# Patient Record
Sex: Male | Born: 1940 | Race: White | Hispanic: No | Smoking: Former smoker
Health system: Southern US, Community
[De-identification: ages and names within clinical notes are randomized; demographics above are authoritative.]

## PROBLEM LIST (undated history)

## (undated) DIAGNOSIS — K219 Gastro-esophageal reflux disease without esophagitis: Secondary | ICD-10-CM

## (undated) DIAGNOSIS — C4492 Squamous cell carcinoma of skin, unspecified: Secondary | ICD-10-CM

## (undated) DIAGNOSIS — I10 Essential (primary) hypertension: Secondary | ICD-10-CM

## (undated) DIAGNOSIS — I2699 Other pulmonary embolism without acute cor pulmonale: Secondary | ICD-10-CM

## (undated) DIAGNOSIS — E785 Hyperlipidemia, unspecified: Secondary | ICD-10-CM

## (undated) DIAGNOSIS — G510 Bell's palsy: Secondary | ICD-10-CM

## (undated) DIAGNOSIS — N2889 Other specified disorders of kidney and ureter: Secondary | ICD-10-CM

## (undated) DIAGNOSIS — I701 Atherosclerosis of renal artery: Secondary | ICD-10-CM

## (undated) DIAGNOSIS — K851 Biliary acute pancreatitis without necrosis or infection: Secondary | ICD-10-CM

## (undated) DIAGNOSIS — H9190 Unspecified hearing loss, unspecified ear: Secondary | ICD-10-CM

## (undated) DIAGNOSIS — H269 Unspecified cataract: Secondary | ICD-10-CM

## (undated) DIAGNOSIS — Z860100 Personal history of colon polyps, unspecified: Secondary | ICD-10-CM

## (undated) DIAGNOSIS — I480 Paroxysmal atrial fibrillation: Secondary | ICD-10-CM

## (undated) DIAGNOSIS — N289 Disorder of kidney and ureter, unspecified: Secondary | ICD-10-CM

## (undated) DIAGNOSIS — M2550 Pain in unspecified joint: Secondary | ICD-10-CM

## (undated) DIAGNOSIS — I2510 Atherosclerotic heart disease of native coronary artery without angina pectoris: Secondary | ICD-10-CM

## (undated) DIAGNOSIS — T7840XA Allergy, unspecified, initial encounter: Secondary | ICD-10-CM

## (undated) DIAGNOSIS — M1990 Unspecified osteoarthritis, unspecified site: Secondary | ICD-10-CM

## (undated) DIAGNOSIS — R739 Hyperglycemia, unspecified: Secondary | ICD-10-CM

## (undated) DIAGNOSIS — N182 Chronic kidney disease, stage 2 (mild): Secondary | ICD-10-CM

## (undated) HISTORY — DX: Atherosclerotic heart disease of native coronary artery without angina pectoris: I25.10

## (undated) HISTORY — DX: Bell's palsy: G51.0

## (undated) HISTORY — DX: Essential (primary) hypertension: I10

## (undated) HISTORY — DX: Gastro-esophageal reflux disease without esophagitis: K21.9

## (undated) HISTORY — DX: Unspecified osteoarthritis, unspecified site: M19.90

## (undated) HISTORY — DX: Hyperlipidemia, unspecified: E78.5

## (undated) HISTORY — DX: Allergy, unspecified, initial encounter: T78.40XA

## (undated) HISTORY — PX: COLONOSCOPY: SHX174

## (undated) HISTORY — DX: Pain in unspecified joint: M25.50

## (undated) HISTORY — DX: Other specified disorders of kidney and ureter: N28.89

## (undated) HISTORY — DX: Squamous cell carcinoma of skin, unspecified: C44.92

## (undated) HISTORY — DX: Unspecified cataract: H26.9

## (undated) HISTORY — DX: Paroxysmal atrial fibrillation: I48.0

## (undated) HISTORY — PX: CARDIAC CATHETERIZATION: SHX172

## (undated) HISTORY — DX: Biliary acute pancreatitis without necrosis or infection: K85.10

## (undated) HISTORY — DX: Chronic kidney disease, stage 2 (mild): N18.2

## (undated) HISTORY — DX: Hyperglycemia, unspecified: R73.9

## (undated) HISTORY — DX: Other pulmonary embolism without acute cor pulmonale: I26.99

## (undated) HISTORY — PX: CATARACT EXTRACTION: SUR2

## (undated) HISTORY — DX: Atherosclerosis of renal artery: I70.1

## (undated) HISTORY — DX: Unspecified hearing loss, unspecified ear: H91.90

---

## 1993-07-15 HISTORY — PX: CORONARY ARTERY BYPASS GRAFT: SHX141

## 2003-02-17 DIAGNOSIS — Z86010 Personal history of colonic polyps: Secondary | ICD-10-CM

## 2004-08-13 ENCOUNTER — Ambulatory Visit: Payer: Self-pay | Admitting: Internal Medicine

## 2004-08-29 ENCOUNTER — Ambulatory Visit: Payer: Self-pay | Admitting: Internal Medicine

## 2004-11-26 ENCOUNTER — Ambulatory Visit: Payer: Self-pay | Admitting: Internal Medicine

## 2005-05-15 ENCOUNTER — Ambulatory Visit: Payer: Self-pay | Admitting: Internal Medicine

## 2005-09-16 ENCOUNTER — Ambulatory Visit: Payer: Self-pay | Admitting: Internal Medicine

## 2005-09-20 ENCOUNTER — Emergency Department (HOSPITAL_COMMUNITY): Admission: EM | Admit: 2005-09-20 | Discharge: 2005-09-20 | Payer: Self-pay | Admitting: Family Medicine

## 2005-09-23 ENCOUNTER — Ambulatory Visit: Payer: Self-pay | Admitting: Internal Medicine

## 2005-10-04 ENCOUNTER — Ambulatory Visit: Payer: Self-pay | Admitting: Internal Medicine

## 2005-10-30 ENCOUNTER — Emergency Department (HOSPITAL_COMMUNITY): Admission: EM | Admit: 2005-10-30 | Discharge: 2005-10-30 | Payer: Self-pay | Admitting: Emergency Medicine

## 2005-10-31 ENCOUNTER — Ambulatory Visit: Payer: Self-pay | Admitting: Internal Medicine

## 2006-02-11 ENCOUNTER — Ambulatory Visit: Payer: Self-pay | Admitting: Internal Medicine

## 2006-05-14 ENCOUNTER — Ambulatory Visit: Payer: Self-pay | Admitting: Internal Medicine

## 2006-06-12 ENCOUNTER — Ambulatory Visit: Payer: Self-pay | Admitting: Internal Medicine

## 2006-06-26 ENCOUNTER — Ambulatory Visit: Payer: Self-pay | Admitting: Internal Medicine

## 2006-06-26 LAB — CONVERTED CEMR LAB
ALT: 20 units/L (ref 0–40)
AST: 23 units/L (ref 0–37)
BUN: 16 mg/dL (ref 6–23)
Calcium: 9.3 mg/dL (ref 8.4–10.5)
Chloride: 110 meq/L (ref 96–112)
Creatinine, Ser: 1.1 mg/dL (ref 0.4–1.5)
GFR calc non Af Amer: 71 mL/min
HDL: 37.5 mg/dL — ABNORMAL LOW (ref >39.0)
LDL Cholesterol: 49 mg/dL (ref 0–99)
PSA: 1.67 ng/mL (ref 0.10–4.00)
Sodium: 142 meq/L (ref 135–145)
TSH: 1.23 microintl units/mL (ref 0.35–5.50)
VLDL: 18 mg/dL (ref 0–40)

## 2006-07-17 ENCOUNTER — Ambulatory Visit: Payer: Self-pay | Admitting: Internal Medicine

## 2006-07-17 LAB — CONVERTED CEMR LAB
Chloride: 104 meq/L (ref 96–112)
GFR calc non Af Amer: 71 mL/min
Glucose, Bld: 125 mg/dL — ABNORMAL HIGH (ref 70–99)
Potassium: 4.5 meq/L (ref 3.5–5.1)
Sodium: 140 meq/L (ref 135–145)

## 2006-08-07 DIAGNOSIS — I10 Essential (primary) hypertension: Secondary | ICD-10-CM | POA: Insufficient documentation

## 2006-08-07 DIAGNOSIS — K219 Gastro-esophageal reflux disease without esophagitis: Secondary | ICD-10-CM | POA: Insufficient documentation

## 2006-08-07 DIAGNOSIS — J309 Allergic rhinitis, unspecified: Secondary | ICD-10-CM | POA: Insufficient documentation

## 2006-08-07 DIAGNOSIS — I2510 Atherosclerotic heart disease of native coronary artery without angina pectoris: Secondary | ICD-10-CM | POA: Insufficient documentation

## 2006-08-07 DIAGNOSIS — E785 Hyperlipidemia, unspecified: Secondary | ICD-10-CM | POA: Insufficient documentation

## 2006-08-26 ENCOUNTER — Ambulatory Visit: Payer: Self-pay | Admitting: Internal Medicine

## 2006-12-01 ENCOUNTER — Encounter: Payer: Self-pay | Admitting: Internal Medicine

## 2006-12-01 ENCOUNTER — Ambulatory Visit: Payer: Self-pay | Admitting: Internal Medicine

## 2006-12-01 DIAGNOSIS — R7989 Other specified abnormal findings of blood chemistry: Secondary | ICD-10-CM | POA: Insufficient documentation

## 2006-12-01 DIAGNOSIS — M549 Dorsalgia, unspecified: Secondary | ICD-10-CM | POA: Insufficient documentation

## 2006-12-01 LAB — CONVERTED CEMR LAB
AST: 21 units/L (ref 0–37)
Eosinophils Relative: 4.8 % (ref 0.0–5.0)
HCT: 42.8 % (ref 39.0–52.0)
Hemoglobin: 14.6 g/dL (ref 13.0–17.0)
LDL Cholesterol: 70 mg/dL (ref 0–99)
MCV: 88.5 fL (ref 78.0–100.0)
Monocytes Absolute: 0.8 10*3/uL — ABNORMAL HIGH (ref 0.2–0.7)
Neutrophils Relative %: 58.4 % (ref 43.0–77.0)
RBC: 4.84 M/uL (ref 4.22–5.81)
RDW: 12.7 % (ref 11.5–14.6)
Total CHOL/HDL Ratio: 3.6
WBC: 8.1 10*3/uL (ref 4.5–10.5)

## 2007-03-03 ENCOUNTER — Ambulatory Visit: Payer: Self-pay | Admitting: Internal Medicine

## 2007-06-02 ENCOUNTER — Ambulatory Visit: Payer: Self-pay | Admitting: Internal Medicine

## 2007-06-25 ENCOUNTER — Encounter: Payer: Self-pay | Admitting: Internal Medicine

## 2007-07-01 ENCOUNTER — Ambulatory Visit: Payer: Self-pay | Admitting: Internal Medicine

## 2007-07-01 LAB — CONVERTED CEMR LAB
Blood in Urine, dipstick: NEGATIVE
Glucose, Urine, Semiquant: NEGATIVE
Ketones, urine, test strip: NEGATIVE
Specific Gravity, Urine: 1.02
pH: 6

## 2007-07-02 LAB — CONVERTED CEMR LAB
CO2: 30 meq/L (ref 19–32)
Cholesterol: 130 mg/dL (ref 0–200)
Creatinine, Ser: 1.4 mg/dL (ref 0.4–1.5)
Glucose, Bld: 108 mg/dL — ABNORMAL HIGH (ref 70–99)
HDL: 32.8 mg/dL — ABNORMAL LOW (ref >39.0)
PSA: 2.08 ng/mL (ref 0.10–4.00)
Potassium: 4.9 meq/L (ref 3.5–5.1)
Sodium: 145 meq/L (ref 135–145)
Triglycerides: 127 mg/dL (ref 0–149)

## 2007-07-03 ENCOUNTER — Telehealth (INDEPENDENT_AMBULATORY_CARE_PROVIDER_SITE_OTHER): Payer: Self-pay | Admitting: *Deleted

## 2007-08-26 ENCOUNTER — Ambulatory Visit: Payer: Self-pay | Admitting: Internal Medicine

## 2007-09-02 LAB — CONVERTED CEMR LAB
Calcium: 9.3 mg/dL (ref 8.4–10.5)
Chloride: 109 meq/L (ref 96–112)
GFR calc non Af Amer: 54 mL/min
Glucose, Bld: 123 mg/dL — ABNORMAL HIGH (ref 70–99)

## 2007-09-29 ENCOUNTER — Telehealth (INDEPENDENT_AMBULATORY_CARE_PROVIDER_SITE_OTHER): Payer: Self-pay | Admitting: *Deleted

## 2007-10-27 ENCOUNTER — Telehealth (INDEPENDENT_AMBULATORY_CARE_PROVIDER_SITE_OTHER): Payer: Self-pay | Admitting: *Deleted

## 2007-11-25 ENCOUNTER — Ambulatory Visit: Payer: Self-pay | Admitting: Internal Medicine

## 2007-11-25 DIAGNOSIS — M25519 Pain in unspecified shoulder: Secondary | ICD-10-CM | POA: Insufficient documentation

## 2007-12-02 ENCOUNTER — Telehealth (INDEPENDENT_AMBULATORY_CARE_PROVIDER_SITE_OTHER): Payer: Self-pay | Admitting: *Deleted

## 2007-12-02 LAB — CONVERTED CEMR LAB
CO2: 31 meq/L (ref 19–32)
Chloride: 106 meq/L (ref 96–112)
Creatinine, Ser: 1.5 mg/dL (ref 0.4–1.5)

## 2007-12-09 ENCOUNTER — Telehealth (INDEPENDENT_AMBULATORY_CARE_PROVIDER_SITE_OTHER): Payer: Self-pay | Admitting: *Deleted

## 2007-12-25 ENCOUNTER — Encounter: Payer: Self-pay | Admitting: Internal Medicine

## 2007-12-25 ENCOUNTER — Ambulatory Visit: Payer: Self-pay | Admitting: Internal Medicine

## 2007-12-25 ENCOUNTER — Telehealth (INDEPENDENT_AMBULATORY_CARE_PROVIDER_SITE_OTHER): Payer: Self-pay | Admitting: *Deleted

## 2007-12-25 LAB — CONVERTED CEMR LAB
BUN: 21 mg/dL (ref 6–23)
Calcium: 9.3 mg/dL (ref 8.4–10.5)
Glucose, Bld: 81 mg/dL (ref 70–99)
Potassium: 4.3 meq/L (ref 3.5–5.3)
Sed Rate: 5 mm/hr (ref 0–16)

## 2007-12-26 ENCOUNTER — Telehealth: Payer: Self-pay | Admitting: Internal Medicine

## 2008-01-01 ENCOUNTER — Ambulatory Visit: Payer: Self-pay | Admitting: Internal Medicine

## 2008-01-20 ENCOUNTER — Encounter (INDEPENDENT_AMBULATORY_CARE_PROVIDER_SITE_OTHER): Payer: Self-pay | Admitting: *Deleted

## 2008-01-27 ENCOUNTER — Telehealth: Payer: Self-pay | Admitting: Internal Medicine

## 2008-03-24 ENCOUNTER — Telehealth (INDEPENDENT_AMBULATORY_CARE_PROVIDER_SITE_OTHER): Payer: Self-pay | Admitting: *Deleted

## 2008-03-28 ENCOUNTER — Ambulatory Visit: Payer: Self-pay | Admitting: Internal Medicine

## 2008-03-28 DIAGNOSIS — F528 Other sexual dysfunction not due to a substance or known physiological condition: Secondary | ICD-10-CM | POA: Insufficient documentation

## 2008-03-28 DIAGNOSIS — R5383 Other fatigue: Secondary | ICD-10-CM

## 2008-03-28 DIAGNOSIS — R5381 Other malaise: Secondary | ICD-10-CM | POA: Insufficient documentation

## 2008-04-04 ENCOUNTER — Encounter (INDEPENDENT_AMBULATORY_CARE_PROVIDER_SITE_OTHER): Payer: Self-pay | Admitting: *Deleted

## 2008-04-04 LAB — CONVERTED CEMR LAB
AST: 22 units/L (ref 0–37)
Albumin: 4 g/dL (ref 3.5–5.2)
Basophils Absolute: 0.1 10*3/uL (ref 0.0–0.1)
Basophils Relative: 0.7 % (ref 0.0–3.0)
Cholesterol: 114 mg/dL (ref 0–200)
Eosinophils Absolute: 0.4 10*3/uL (ref 0.0–0.7)
HCT: 45.2 % (ref 39.0–52.0)
Hemoglobin: 15 g/dL (ref 13.0–17.0)
Hgb A1c MFr Bld: 5.6 % (ref 4.6–6.0)
MCHC: 33.1 g/dL (ref 30.0–36.0)
MCV: 88.7 fL (ref 78.0–100.0)
Monocytes Absolute: 0.8 10*3/uL (ref 0.1–1.0)
Neutro Abs: 5.3 10*3/uL (ref 1.4–7.7)
RBC: 5.1 M/uL (ref 4.22–5.81)
RDW: 12.4 % (ref 11.5–14.6)
TSH: 1.61 microintl units/mL (ref 0.35–5.50)
Total Bilirubin: 0.7 mg/dL (ref 0.3–1.2)
Triglycerides: 85 mg/dL (ref 0–149)

## 2008-04-19 ENCOUNTER — Ambulatory Visit: Payer: Self-pay | Admitting: Internal Medicine

## 2008-04-28 ENCOUNTER — Telehealth (INDEPENDENT_AMBULATORY_CARE_PROVIDER_SITE_OTHER): Payer: Self-pay | Admitting: *Deleted

## 2008-07-25 ENCOUNTER — Ambulatory Visit: Payer: Self-pay | Admitting: Internal Medicine

## 2008-07-25 ENCOUNTER — Telehealth (INDEPENDENT_AMBULATORY_CARE_PROVIDER_SITE_OTHER): Payer: Self-pay | Admitting: *Deleted

## 2008-07-25 DIAGNOSIS — L851 Acquired keratosis [keratoderma] palmaris et plantaris: Secondary | ICD-10-CM | POA: Insufficient documentation

## 2008-07-27 ENCOUNTER — Encounter (INDEPENDENT_AMBULATORY_CARE_PROVIDER_SITE_OTHER): Payer: Self-pay | Admitting: *Deleted

## 2008-07-27 LAB — CONVERTED CEMR LAB
BUN: 21 mg/dL (ref 6–23)
Chloride: 103 meq/L (ref 96–112)
GFR calc Af Amer: 96 mL/min
GFR calc non Af Amer: 79 mL/min
Potassium: 4.1 meq/L (ref 3.5–5.1)
Sodium: 141 meq/L (ref 135–145)

## 2008-08-29 ENCOUNTER — Telehealth (INDEPENDENT_AMBULATORY_CARE_PROVIDER_SITE_OTHER): Payer: Self-pay | Admitting: *Deleted

## 2008-09-26 ENCOUNTER — Telehealth (INDEPENDENT_AMBULATORY_CARE_PROVIDER_SITE_OTHER): Payer: Self-pay | Admitting: *Deleted

## 2008-10-28 ENCOUNTER — Ambulatory Visit: Payer: Self-pay | Admitting: Internal Medicine

## 2008-11-08 ENCOUNTER — Telehealth (INDEPENDENT_AMBULATORY_CARE_PROVIDER_SITE_OTHER): Payer: Self-pay | Admitting: *Deleted

## 2008-11-08 LAB — CONVERTED CEMR LAB
AST: 22 units/L (ref 0–37)
Hemoglobin: 14.9 g/dL (ref 13.0–17.0)
PSA: 1.46 ng/mL (ref 0.10–4.00)
VLDL: 18 mg/dL (ref 0.0–40.0)

## 2008-11-15 ENCOUNTER — Telehealth (INDEPENDENT_AMBULATORY_CARE_PROVIDER_SITE_OTHER): Payer: Self-pay | Admitting: *Deleted

## 2008-11-28 ENCOUNTER — Telehealth (INDEPENDENT_AMBULATORY_CARE_PROVIDER_SITE_OTHER): Payer: Self-pay | Admitting: *Deleted

## 2008-12-23 ENCOUNTER — Telehealth (INDEPENDENT_AMBULATORY_CARE_PROVIDER_SITE_OTHER): Payer: Self-pay | Admitting: *Deleted

## 2009-01-09 ENCOUNTER — Telehealth (INDEPENDENT_AMBULATORY_CARE_PROVIDER_SITE_OTHER): Payer: Self-pay | Admitting: *Deleted

## 2009-01-24 ENCOUNTER — Telehealth (INDEPENDENT_AMBULATORY_CARE_PROVIDER_SITE_OTHER): Payer: Self-pay | Admitting: *Deleted

## 2009-03-22 ENCOUNTER — Encounter (INDEPENDENT_AMBULATORY_CARE_PROVIDER_SITE_OTHER): Payer: Self-pay | Admitting: *Deleted

## 2009-04-28 ENCOUNTER — Ambulatory Visit: Payer: Self-pay | Admitting: Internal Medicine

## 2009-05-02 ENCOUNTER — Telehealth (INDEPENDENT_AMBULATORY_CARE_PROVIDER_SITE_OTHER): Payer: Self-pay | Admitting: *Deleted

## 2009-05-04 LAB — CONVERTED CEMR LAB
ALT: 26 units/L (ref 0–53)
AST: 23 units/L (ref 0–37)
Calcium: 9 mg/dL (ref 8.4–10.5)
Chloride: 106 meq/L (ref 96–112)
Creatinine, Ser: 1.3 mg/dL (ref 0.4–1.5)
Hgb A1c MFr Bld: 5.5 % (ref 4.6–6.5)
Sodium: 140 meq/L (ref 135–145)

## 2009-05-18 ENCOUNTER — Telehealth: Payer: Self-pay | Admitting: Internal Medicine

## 2009-06-06 ENCOUNTER — Encounter (INDEPENDENT_AMBULATORY_CARE_PROVIDER_SITE_OTHER): Payer: Self-pay

## 2009-06-12 ENCOUNTER — Ambulatory Visit: Payer: Self-pay | Admitting: Internal Medicine

## 2009-06-26 ENCOUNTER — Ambulatory Visit: Payer: Self-pay | Admitting: Internal Medicine

## 2009-06-26 LAB — HM COLONOSCOPY

## 2009-10-26 ENCOUNTER — Telehealth (INDEPENDENT_AMBULATORY_CARE_PROVIDER_SITE_OTHER): Payer: Self-pay | Admitting: *Deleted

## 2009-10-27 ENCOUNTER — Ambulatory Visit: Payer: Self-pay | Admitting: Internal Medicine

## 2009-10-31 LAB — CONVERTED CEMR LAB
Basophils Relative: 0.6 % (ref 0.0–3.0)
Calcium: 9.5 mg/dL (ref 8.4–10.5)
Creatinine, Ser: 1.6 mg/dL — ABNORMAL HIGH (ref 0.4–1.5)
Eosinophils Relative: 5.3 % — ABNORMAL HIGH (ref 0.0–5.0)
HDL: 37.3 mg/dL — ABNORMAL LOW (ref >39.00)
LDL Cholesterol: 45 mg/dL (ref 0–99)
Lymphocytes Relative: 26.7 % (ref 12.0–46.0)
Monocytes Relative: 8.9 % (ref 3.0–12.0)
Neutrophils Relative %: 58.5 % (ref 43.0–77.0)
RBC: 4.55 M/uL (ref 4.22–5.81)
Sodium: 142 meq/L (ref 135–145)
TSH: 2.1 microintl units/mL (ref 0.35–5.50)
Total CHOL/HDL Ratio: 3
Triglycerides: 114 mg/dL (ref 0.0–149.0)
VLDL: 22.8 mg/dL (ref 0.0–40.0)
WBC: 8.3 10*3/uL (ref 4.5–10.5)

## 2009-11-27 ENCOUNTER — Ambulatory Visit: Payer: Self-pay | Admitting: Internal Medicine

## 2009-11-27 DIAGNOSIS — R944 Abnormal results of kidney function studies: Secondary | ICD-10-CM | POA: Insufficient documentation

## 2009-11-27 DIAGNOSIS — R748 Abnormal levels of other serum enzymes: Secondary | ICD-10-CM | POA: Insufficient documentation

## 2009-11-28 LAB — CONVERTED CEMR LAB
BUN: 26 mg/dL — ABNORMAL HIGH (ref 6–23)
CO2: 31 meq/L (ref 19–32)
Chloride: 105 meq/L (ref 96–112)
Creatinine, Ser: 1.2 mg/dL (ref 0.4–1.5)

## 2009-12-08 ENCOUNTER — Telehealth (INDEPENDENT_AMBULATORY_CARE_PROVIDER_SITE_OTHER): Payer: Self-pay | Admitting: *Deleted

## 2009-12-14 ENCOUNTER — Telehealth: Payer: Self-pay | Admitting: Internal Medicine

## 2009-12-20 ENCOUNTER — Telehealth (INDEPENDENT_AMBULATORY_CARE_PROVIDER_SITE_OTHER): Payer: Self-pay | Admitting: *Deleted

## 2009-12-27 ENCOUNTER — Ambulatory Visit: Payer: Self-pay | Admitting: Internal Medicine

## 2010-05-02 ENCOUNTER — Ambulatory Visit: Payer: Self-pay | Admitting: Internal Medicine

## 2010-05-02 DIAGNOSIS — N453 Epididymo-orchitis: Secondary | ICD-10-CM | POA: Insufficient documentation

## 2010-05-02 LAB — CONVERTED CEMR LAB
Bilirubin Urine: NEGATIVE
Protein, U semiquant: NEGATIVE
Urobilinogen, UA: 0.2

## 2010-05-04 LAB — CONVERTED CEMR LAB
AST: 19 units/L (ref 0–37)
CO2: 28 meq/L (ref 19–32)
Chloride: 109 meq/L (ref 96–112)
Creatinine, Ser: 1.5 mg/dL (ref 0.4–1.5)
Hgb A1c MFr Bld: 5.8 % (ref 4.6–6.5)

## 2010-05-07 ENCOUNTER — Telehealth: Payer: Self-pay | Admitting: Internal Medicine

## 2010-05-07 ENCOUNTER — Encounter: Admission: RE | Admit: 2010-05-07 | Discharge: 2010-05-07 | Payer: Self-pay | Admitting: Internal Medicine

## 2010-05-08 ENCOUNTER — Ambulatory Visit: Payer: Self-pay | Admitting: Internal Medicine

## 2010-05-14 ENCOUNTER — Telehealth: Payer: Self-pay | Admitting: Internal Medicine

## 2010-05-21 ENCOUNTER — Ambulatory Visit: Payer: Self-pay | Admitting: Family Medicine

## 2010-05-21 DIAGNOSIS — M542 Cervicalgia: Secondary | ICD-10-CM | POA: Insufficient documentation

## 2010-05-21 DIAGNOSIS — R519 Headache, unspecified: Secondary | ICD-10-CM | POA: Insufficient documentation

## 2010-05-21 DIAGNOSIS — R51 Headache: Secondary | ICD-10-CM | POA: Insufficient documentation

## 2010-05-28 ENCOUNTER — Ambulatory Visit: Payer: Self-pay | Admitting: Internal Medicine

## 2010-08-14 NOTE — Progress Notes (Signed)
Summary: Refill Request  Phone Note Refill Request Call back at [phone] Message from:  Pharmacy on December 14, 2009 4:21 PM  Refills Requested: Medication #1:  DICLOFENAC SODIUM 50 MG TBEC 1 by mouth three times a day as needed   Dosage confirmed as above?Dosage Confirmed   Supply Requested: 3 months   Last Refilled: 05/22/2009 [facility]  Next Appointment Scheduled: 10.19.11 Initial call taken by: Harold Barban,  December 14, 2009 4:21 PM  Follow-up for Phone Call        LAST FILLED 11-11-08 #90 6, LAST OV 10-27-09.................Marland KitchenFelecia Deloach CMA  December 15, 2009 2:15 PM  ok 90 and 3 RF Elainah Rhyne E. Magon Croson MD  December 15, 2009 2:40 PM     Prescriptions: DICLOFENAC SODIUM 50 MG TBEC (DICLOFENAC SODIUM) 1 by mouth three times a day as needed  #90 x 3   Entered by:   Jeremy Johann CMA   Authorized by:   Nolon Rod. Yaileen Hofferber MD   Signed by:   Jeremy Johann CMA on 12/15/2009   Method used:   Faxed to ...       [facility]* (retail)       [address]       [place], [place]  [address]       Ph: [phone]       Fax: [phone]   RxID:   [id number]

## 2010-08-14 NOTE — Assessment & Plan Note (Signed)
Summary: POISION OAK/IVY//[facility]   Vital Signs:  Patient profile:   70 year old male Height:      70 inches Weight:      196 pounds Temp:     97.6 degrees F oral Pulse rate:   54 / minute BP sitting:   100 / 70  (left arm)  Vitals Entered By: Jeremy Johann CMA (December 27, 2009 10:57 AM) CC: reaction to poision oak Comments --swelling in lip,eye, throat xAM REVIEWED MED LIST, PATIENT AGREED DOSE AND INSTRUCTION CORRECT     History of Present Illness: cleared  the yard 2 days ago, was exposed to poison oak Complaining of a rash and itching at the chin, behind the left ear, lips. Also the upper eyelid  on the left  Allergies: 1)  ! Ultram 2)  ! Vicodin  Past History:  Past Medical History: Reviewed history from 10/27/2009 and no changes required. Allergic rhinitis Coronary artery disease GERD Hyperlipidemia Hypertension HYPERGLYCEMIA uses valium prn for back pain-shoulder pain. Occ uses for insomnia  HOH, has a hearing aid L, sees audiology routinely   Past Surgical History: Reviewed history from 08/07/2006 and no changes required. Coronary artery bypass graft (1995)  Social History: Reviewed history from 10/27/2009 and no changes required. Married one child born in [place] , has a large extended family , oldest of several brothers-sister  retired, breath delivery truck driver Tobacco-- quit in the 80s ETOH-- no diet-- healthy  exercise-- limited by back pain   Review of Systems       denies fevers No arthralgias No tonge swelling    Physical Exam  General:  alert and well-developed.   Mouth:  tonge normal to inspection Lungs:  normal respiratory effort, no intercostal retractions, no accessory muscle use, and normal breath sounds.   Heart:  normal rate, regular rhythm, and no murmur.   Skin:  erythema and some swelling behind the left ear and under the jaw, left side. Left upper eyelid slightly swollen, was worse this morning according to the  patient forehead slightly red without swelling   Impression & Recommendations:  Problem # 1:  CONTACT DERMATITIS (ICD-692.9)  symptoms likely due to contact dermatitis The patient has slight hyperglycemia see instructions His updated medication list for this problem includes:    Allegra 180 Mg Tabs (Fexofenadine hcl) .Marland Kitchen... Take 1 tablet by mouth once a day    Prednisone 20 Mg Tabs (Prednisone) ..... One by mouth daily for 5 days    Hydrocortisone 2.5 % Crea (Hydrocortisone) .Marland Kitchen... Applied twice a day for one week. avoid eye lids  Orders: Prescription Created Electronically [phone])  Complete Medication List: 1)  Amlodipine Besylate 5 Mg Tabs (Amlodipine besylate) .Marland Kitchen.. 1 by mouth qd 2)  Benazepril Hcl 20 Mg Tabs (Benazepril hcl) .Marland Kitchen.. 1 by mouth two times a day 3)  Atenolol 50 Mg Tabs (Atenolol) .... Take 1 1/2 tablet by mouth once a day 4)  Niaspan 1000 Mg Tbcr (Niacin (antihyperlipidemic)) .Marland Kitchen.. 1 by mouth at bedtime 5)  Zocor 40 Mg Tabs (Simvastatin) .... Take 1 tablet by mouth at bedtime 6)  Valium 10 Mg Tabs (Diazepam) .... Take 1 tablet by mouth twice a day as needed back spasm/pain 7)  Allegra 180 Mg Tabs (Fexofenadine hcl) .... Take 1 tablet by mouth once a day 8)  Nexium 40 Mg Cpdr (Esomeprazole magnesium) .... Take 1 capsule by mouth once a day as needed 9)  Diclofenac Sodium 50 Mg Tbec (Diclofenac sodium) .Marland Kitchen.. 1 by mouth three  times a day as needed 10)  Aspirin 81 Mg Tbec (Aspirin) .Marland Kitchen.. 1 daily 11)  Prednisone 20 Mg Tabs (Prednisone) .... One by mouth daily for 5 days 12)  Hydrocortisone 2.5 % Crea (Hydrocortisone) .... Applied twice a day for one week. avoid eye lids  Patient Instructions: 1)  prednisone by mouth for 5 days 2)  Hydrocortisone: apply  twice a day for one week,do not use close to the eye Prescriptions: HYDROCORTISONE 2.5 % CREA (HYDROCORTISONE) applied twice a day for one week. Avoid eye lids  #1 x 0   Entered and Authorized by:   Nolon Rod. Delon Revelo MD   Signed  by:   Nolon Rod. Keneshia Tena MD on 12/27/2009   Method used:   Electronically to        [facility]* (retail)       [address]       [place], [place]  [address]       Ph: [phone]       Fax: [phone]   RxID:   [id number] PREDNISONE 20 MG TABS (PREDNISONE) one by mouth daily for 5 days  #5 x 0   Entered and Authorized by:   Nolon Rod. Aul Mangieri MD   Signed by:   Nolon Rod. Romie Keeble MD on 12/27/2009   Method used:   Electronically to        [facility]* (retail)       [address]       [place], [place]  [address]       Ph: [phone]       Fax: [phone]   RxID:   [id number]
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
## 2010-08-14 NOTE — Assessment & Plan Note (Signed)
Summary: reevaluation/drb   Vital Signs:  Patient profile:   70 year old male Weight:      199 pounds Pulse rate:   64 / minute Pulse rhythm:   re126 BP sitting:   126 / 84  (left arm) Cuff size:   large  Vitals Entered By: Army Fossa CMA (May 08, 2010 3:42 PM) CC: Pt here for evlautaion, pain level 4-5 Pain Assessment Patient in pain? yes      Comments Pain comes and goes  [facility]   History of Present Illness: was recently diagnosed with epididymitis Here for reevaluation When he called he meant to say that the pain was about 3 or 4 (not  7 or 8, see  phone note), today the pain in the buttock is essentially resolved and the pain at the epididymus is much better; he did have some discomfort at the inner upper left thigh which is now resolved  ROS Denies fevers No dysuria or frequency No rash anywhere in the genital area or back  Current Medications (verified): 1)  Amlodipine Besylate 5 Mg  Tabs (Amlodipine Besylate) .Marland Kitchen.. 1 By Mouth Qd 2)  Benazepril Hcl 20 Mg  Tabs (Benazepril Hcl) .Marland Kitchen.. 1 By Mouth Two Times A Day 3)  Atenolol 50 Mg Tabs (Atenolol) .... Take 1 1/2 Tablet By Mouth Once A Day 4)  Niaspan 1000 Mg Tbcr (Niacin (Antihyperlipidemic)) .Marland Kitchen.. 1 By Mouth At Bedtime 5)  Zocor 40 Mg Tabs (Simvastatin) .... Take 1 Tablet By Mouth At Bedtime 6)  Valium 10 Mg Tabs (Diazepam) .... Take 1 Tablet By Mouth Twice A Day As Needed Back Spasm/pain 7)  Allegra 180 Mg Tabs (Fexofenadine Hcl) .... Take 1 Tablet By Mouth Once A Day 8)  Nexium 40 Mg Cpdr (Esomeprazole Magnesium) .... Take 1 Capsule By Mouth Once A Day As Needed 9)  Diclofenac Sodium 50 Mg Tbec (Diclofenac Sodium) .Marland Kitchen.. 1 By Mouth Three Times A Day As Needed 10)  Aspirin 81 Mg Tbec (Aspirin) .Marland Kitchen.. 1 Daily 11)  Bactrim Ds 800-160 Mg Tabs (Sulfamethoxazole-Trimethoprim) .... One By Mouth Twice A Day  Allergies (verified): 1)  ! Ultram 2)  ! Vicodin  Past History:  Past Medical History: Reviewed  history from 10/27/2009 and no changes required. Allergic rhinitis Coronary artery disease GERD Hyperlipidemia Hypertension HYPERGLYCEMIA uses valium prn for back pain-shoulder pain. Occ uses for insomnia  HOH, has a hearing aid L, sees audiology routinely   Past Surgical History: Reviewed history from 08/07/2006 and no changes required. Coronary artery bypass graft (1995)  Social History: Reviewed history from 05/02/2010 and no changes required. 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   Abdomen:  soft, non-tender, no distention, no masses, no guarding, and no rigidity.  no CVA tenderness Genitalia:  uncircumcised, no hydrocele, no varicocele, no scrotal masses, no testicular masses or atrophy, no cutaneous lesions, and no urethral discharge.  left epididymis is now symmetric with the right and  nontender   Impression & Recommendations:  Problem # 1:  EPIDIDYMITIS, LEFT (ICD-604.90) resolving left epididymitis Continue with antibiotics Call if not completely back to normal in  2 or 3 weeks  Complete Medication List: 1)  Amlodipine Besylate 5 Mg Tabs (Amlodipine besylate) .Marland Kitchen.. 1 by mouth qd 2)  Benazepril  Hcl 20 Mg Tabs (Benazepril hcl) .Marland Kitchen.. 1 by mouth two times a day 3)  Atenolol 50 Mg Tabs (Atenolol) .... Take 1 1/2 tablet by mouth once a day 4)  Niaspan 1000 Mg Tbcr (Niacin (antihyperlipidemic)) .Marland Kitchen.. 1 by mouth at bedtime 5)  Zocor 40 Mg Tabs (Simvastatin) .... Take 1 tablet by mouth at bedtime 6)  Valium 10 Mg Tabs (Diazepam) .... Take 1 tablet by mouth twice a day as needed back spasm/pain 7)  Allegra 180 Mg Tabs (Fexofenadine hcl) .... Take 1 tablet by mouth once a day 8)  Nexium 40 Mg Cpdr (Esomeprazole magnesium) .... Take 1 capsule  by mouth once a day as needed 9)  Diclofenac Sodium 50 Mg Tbec (Diclofenac sodium) .Marland Kitchen.. 1 by mouth three times a day as needed 10)  Aspirin 81 Mg Tbec (Aspirin) .Marland Kitchen.. 1 daily 11)  Bactrim Ds 800-160 Mg Tabs (Sulfamethoxazole-trimethoprim) .... One by mouth twice a day   Orders Added: 1)  Est. Patient Level III [[address]]

## 2010-08-14 NOTE — Progress Notes (Signed)
Summary: refill Valium  Phone Note Refill Request Message from:  Pharmacy  [facility] [address]  Refills Requested: Medication #1:  VALIUM 10 MG TABS Take 1 tablet by mouth twice a day as needed back spasm/pain  Medication #2:  ALLEGRA 180 MG TABS Take 1 tablet by mouth once a day last refill #60 x 3 on 05/03/09 last ov cpx 10/27/09  Initial call taken by: Kandice Hams,  Dec 08, 2009 2:11 PM Caller: [facility]* [place]  Follow-up for Phone Call        ok 60 and 3 Rf Jose E. Paz MD  Dec 08, 2009 3:09 PM     Prescriptions: VALIUM 10 MG TABS (DIAZEPAM) Take 1 tablet by mouth twice a day as needed back spasm/pain  #60 x 3   Entered by:   Kandice Hams   Authorized by:   Nolon Rod. Paz MD   Signed by:   Kandice Hams on 12/08/2009   Method used:   Printed then faxed to ...       [facility]* (retail)       [address]       [place], [place]  [address]       Ph: [phone]       Fax: [phone]   RxID:   [id number] ALLEGRA 180 MG TABS (FEXOFENADINE HCL) Take 1 tablet by mouth once a day  #30 x 11   Entered by:   Kandice Hams   Authorized by:   Nolon Rod. Paz MD   Signed by:   Kandice Hams on 12/08/2009   Method used:   Telephoned to ...       [facility]* (retail)       [address]       [place], [place]  [address]       Ph: [phone]       Fax: [phone]   RxID:   [id number]

## 2010-08-14 NOTE — Assessment & Plan Note (Signed)
Summary: RTO 6 MONTHS /CBS   Vital Signs:  Patient profile:   70 year old male Weight:      198.13 pounds Pulse rate:   65 / minute Pulse rhythm:   regular BP sitting:   142 / 84  (left arm) Cuff size:   large  Vitals Entered By: Army Fossa CMA (May 02, 2010 9:10 AM) CC: 6 month f/u- fasting  Comments feels like he pulled a muscle in back of (L) thigh. [facility]    History of Present Illness:  6 month f/u- fasting   c/o a deep ache at the distal L buttock and proximal L thigh, along w/ a ache in the L testicle on-off x 3 days. Also some back ache. Symptoms are worse with walking. no associated rash   Hyperlipidemia-- good medication compliance , no myalgias except for above symptoms  Hypertension-- ambulatory BPs 130s/80 HYPERGLYCEMIA-- due for labs   flu shot   Current Medications (verified): 1)  Amlodipine Besylate 5 Mg  Tabs (Amlodipine Besylate) .Marland Kitchen.. 1 By Mouth Qd 2)  Benazepril Hcl 20 Mg  Tabs (Benazepril Hcl) .Marland Kitchen.. 1 By Mouth Two Times A Day 3)  Atenolol 50 Mg Tabs (Atenolol) .... Take 1 1/2 Tablet By Mouth Once A Day 4)  Niaspan 1000 Mg Tbcr (Niacin (Antihyperlipidemic)) .Marland Kitchen.. 1 By Mouth At Bedtime 5)  Zocor 40 Mg Tabs (Simvastatin) .... Take 1 Tablet By Mouth At Bedtime 6)  Valium 10 Mg Tabs (Diazepam) .... Take 1 Tablet By Mouth Twice A Day As Needed Back Spasm/pain 7)  Allegra 180 Mg Tabs (Fexofenadine Hcl) .... Take 1 Tablet By Mouth Once A Day 8)  Nexium 40 Mg Cpdr (Esomeprazole Magnesium) .... Take 1 Capsule By Mouth Once A Day As Needed 9)  Diclofenac Sodium 50 Mg Tbec (Diclofenac Sodium) .Marland Kitchen.. 1 By Mouth Three Times A Day As Needed 10)  Aspirin 81 Mg Tbec (Aspirin) .Marland Kitchen.. 1 Daily  Allergies (verified): 1)  ! Ultram 2)  ! Vicodin  Past History:  Past Medical History: Reviewed history from 10/27/2009 and no changes required. Allergic rhinitis Coronary artery disease GERD Hyperlipidemia Hypertension HYPERGLYCEMIA uses  valium prn for back pain-shoulder pain. Occ uses for insomnia  HOH, has a hearing aid L, sees audiology routinely   Past Surgical History: Reviewed history from 08/07/2006 and no changes required. 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 Resp:  Denies cough and shortness of breath. GU:  Denies dysuria, genital sores, urinary frequency, and urinary hesitancy.  Physical Exam  General:  alert and well-developed.   Lungs:  normal respiratory effort, no intercostal retractions, no accessory muscle use, and normal breath sounds.   Heart:  normal rate, regular rhythm, and no murmur.   Abdomen:  soft, non-tender, no distention, no masses, no guarding, and no rigidity.   Genitalia:  uncircumcised, no hydrocele, no varicocele, no scrotal masses, no testicular masses or atrophy, no cutaneous lesions, and no urethral discharge.  left proximal epididymis slightly larger than right, slightly tender Msk:  nontender to palpation at the lower back Extremities:  no lower extremity edema Neurologic:  alert & oriented X3, strength normal in all extremities, gait normal,  and DTRs symmetrical and normal.  straight leg test negative Skin:  no skin rash in the buttocks, legs or genital area   Impression & Recommendations:  Problem # 1:  ? of EPIDIDYMITIS, LEFT (ICD-604.90)  presents with pain in the left buttock and left epididymus.  DDX--epididymitis? ( tender in the left epididymis)  Early herpes? radiculopathy? normal  UA start antibiotics Patient to call if symptoms increase, rash, testicular swelling  Orders: UA Dipstick w/o Micro (automated)  (81003)  Problem # 2:  HYPERTENSION  (ICD-401.9) check a BMP, creatinine was a slightly higher earlier this year His updated medication list for this problem includes:    Amlodipine Besylate 5 Mg Tabs (Amlodipine besylate) .Marland Kitchen... 1 by mouth qd    Benazepril Hcl 20 Mg Tabs (Benazepril hcl) .Marland Kitchen... 1 by mouth two times a day    Atenolol 50 Mg Tabs (Atenolol) .Marland Kitchen... Take 1 1/2 tablet by mouth once a day  Orders: TLB-BMP (Basic Metabolic Panel-BMET) (80048-METABOL)  Problem # 3:  HYPERLIPIDEMIA (ICD-272.4) at goal  His updated medication list for this problem includes:    Niaspan 1000 Mg Tbcr (Niacin (antihyperlipidemic)) .Marland Kitchen... 1 by mouth at bedtime    Zocor 40 Mg Tabs (Simvastatin) .Marland Kitchen... Take 1 tablet by mouth at bedtime  Labs Reviewed: SGOT: 27 (10/27/2009)   SGPT: 30 (10/27/2009)   HDL:37.30 (10/27/2009), 37.00 (10/28/2008)  LDL:45 (10/27/2009), 62 (10/28/2008)  Chol:105 (10/27/2009), 117 (10/28/2008)  Trig:114.0 (10/27/2009), 90.0 (10/28/2008)  Orders: Venipuncture (01027) TLB-ALT (SGPT) (84460-ALT) TLB-AST (SGOT) (84450-SGOT) Specimen Handling (25366)  Problem # 4:  HYPERGLYCEMIA (ICD-790.6)  Orders: TLB-A1C / Hgb A1C (Glycohemoglobin) (83036-A1C) Specimen Handling (44034)  Complete Medication List: 1)  Amlodipine Besylate 5 Mg Tabs (Amlodipine besylate) .Marland Kitchen.. 1 by mouth qd 2)  Benazepril Hcl 20 Mg Tabs (Benazepril hcl) .Marland Kitchen.. 1 by mouth two times a day 3)  Atenolol 50 Mg Tabs (Atenolol) .... Take 1 1/2 tablet by mouth once a day 4)  Niaspan 1000 Mg Tbcr (Niacin (antihyperlipidemic)) .Marland Kitchen.. 1 by mouth at bedtime 5)  Zocor 40 Mg Tabs (Simvastatin) .... Take 1 tablet by mouth at bedtime 6)  Valium 10 Mg Tabs (Diazepam) .... Take 1 tablet by mouth twice a day as needed back spasm/pain 7)  Allegra 180 Mg Tabs (Fexofenadine hcl) .... Take 1 tablet by mouth once a day 8)  Nexium 40 Mg Cpdr (Esomeprazole magnesium) .... Take 1 capsule by mouth once a day as needed 9)  Diclofenac Sodium 50 Mg Tbec (Diclofenac sodium) .Marland Kitchen.. 1 by  mouth three times a day as needed 10)  Aspirin 81 Mg Tbec (Aspirin) .Marland Kitchen.. 1 daily 11)  Bactrim Ds 800-160 Mg Tabs (Sulfamethoxazole-trimethoprim) .... One by mouth twice a day  Other Orders: Flu Vaccine 47yrs + MEDICARE PATIENTS (V4259) Administration Flu vaccine - MCR (D6387)  Patient Instructions: 1)  Please schedule a follow-up appointment in 6 months .  Prescriptions: BACTRIM DS 800-160 MG TABS (SULFAMETHOXAZOLE-TRIMETHOPRIM) one by mouth twice a day  #20 x 0   Entered and Authorized by:   Nolon Rod. Zeppelin Beckstrand MD   Signed by:   Nolon Rod. Saidah Kempton MD on 05/02/2010   Method used:   Electronically to        [facility]* (retail)       [address]       [place], [place]  [address]       Ph: [phone]       Fax: [phone]   RxID:   [id number]    Orders Added: 1)  Flu Vaccine 67yrs + MEDICARE PATIENTS [Q2039] 2)  Administration Flu vaccine - MCR [G0008] 3)  Venipuncture [36415] 4)  TLB-A1C / Hgb A1C (Glycohemoglobin) [83036-A1C] 5)  TLB-BMP (Basic Metabolic Panel-BMET) [80048-METABOL] 6)  TLB-ALT (SGPT) [84460-ALT] 7)  TLB-AST (SGOT) [84450-SGOT] 8)  Specimen Handling [99000] 9)  UA Dipstick w/o Micro (automated)  [81003] 10)  Est. Patient Level IV [41324] Flu Vaccine Consent Questions     Do you have a history of severe allergic reactions to this vaccine? no    Any prior history of allergic reactions to egg and/or gelatin? no    Do you have a sensitivity to the preservative Thimersol? no    Do you have a past history of Guillan-Barre Syndrome? no    Do you currently have an acute febrile illness? no    Have you ever had a severe reaction to latex? no    Vaccine information given and explained to patient? yes    Are you currently pregnant? no    Lot Number:[id number]   Exp Date:01/12/2011   Site Given  Left Deltoid IMtration Flu vaccine - MCR [G0008]       .lbmedflu  Laboratory Results   Urine Tests    Routine Urinalysis   Color: yellow Appearance:  Clear Glucose: negative   (Normal Range: Negative) Bilirubin: negative   (Normal Range: Negative) Ketone: negative   (Normal Range: Negative) Spec. Gravity: 1.020   (Normal Range: 1.003-1.035) Blood: negative   (Normal Range: Negative) pH: 6.0   (Normal Range: 5.0-8.0) Protein: negative   (Normal Range: Negative) Urobilinogen: 0.2   (Normal Range: 0-1) Nitrite: negative   (Normal Range: Negative) Leukocyte Esterace: negative   (Normal Range: Negative)    Comments: Army Fossa CMA  May 02, 2010 10:10 AM

## 2010-08-14 NOTE — Progress Notes (Signed)
Summary: refill request  Phone Note Refill Request Call back at [phone] Message from:  Pharmacy on December 20, 2009 2:19 PM  Refills Requested: Medication #1:  AMLODIPINE BESYLATE 5 MG  TABS 1 by mouth qd   Dosage confirmed as above?Dosage Confirmed   Supply Requested: 1 month [facility]  [address] [place] [phone]  Next Appointment Scheduled: none Initial call taken by: Harold Barban,  December 20, 2009 2:20 PM  Follow-up for Phone Call        spoke with pharmacy rx on file from 10-27-09 #90 3. dis regard request.........Marland KitchenFelecia Deloach CMA  December 20, 2009 4:26 PM

## 2010-08-14 NOTE — Progress Notes (Signed)
Summary: refill  Phone Note Refill Request Message from:  Fax from Pharmacy on October 26, 2009 9:21 AM  simvastatin 40mg Marcheta Grammes fax [phone],fexofenadine hcl 180mg    Method Requested: Fax to Local Pharmacy Next Appointment Scheduled: 10/27/2009 Initial call taken by: Barb Merino,  October 26, 2009 9:22 AM    Prescriptions: ZOCOR 40 MG TABS (SIMVASTATIN) Take 1 tablet by mouth at bedtime  #30 x 0   Entered by:   Kandice Hams   Authorized by:   Nolon Rod. Paz MD   Signed by:   Kandice Hams on 10/26/2009   Method used:   Faxed to ...       [facility]* (retail)       [address]       [place], [place]  [address]       Ph: [phone]       Fax: [phone]   RxID:   [id number] ALLEGRA 180 MG TABS (FEXOFENADINE HCL) Take 1 tablet by mouth once a day  #30 x 0   Entered by:   Kandice Hams   Authorized by:   Nolon Rod. Paz MD   Signed by:   Kandice Hams on 10/26/2009   Method used:   Faxed to ...       [facility]* (retail)       [address]       [place], [place]  [address]       Ph: [phone]       Fax: [phone]   RxID:   [id number]
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
## 2010-08-14 NOTE — Progress Notes (Signed)
Summary: Triage: Ongoing Pain  Phone Note Call from Patient Call back at Home Phone [phone] Call back at [phone]   Caller: Spouse Summary of Call: Message left on Triage Voicemail: Patient still hurting, please call.  I called the patient's wife and she indicated patient still in pain-related to shingles dx. On a scale of 1-10 (7/8). Pain increases with any movement. Patient with pending appointment for a  Renal U/S at 3:45pm-? what can be done to help with pain or should patient just wait til U/S results back.  Dr.Gary Bultman please advise  Shonna Chock CMA  May 07, 2010 2:02 PM    Follow-up for Phone Call        ultrasound was okay. Could the patient come tomorrow for a re-evaluation ? Follow-up by: Nolon Rod. Oluwatomisin Hustead MD,  May 07, 2010 4:42 PM  Additional Follow-up for Phone Call Additional follow up Details #1::        pt has appt tomorrow. Army Fossa CMA  May 07, 2010 4:49 PM

## 2010-08-14 NOTE — Progress Notes (Signed)
Summary: pt status  ---- Converted from flag ---- ---- 05/03/2010 5:45 PM, Brittyn Salaz E. Wynelle Dreier MD wrote: was seen with epididymitis last week, please check on him.  Better ? ------------------------------  Pt states that he is doing better! No pain at all! Army Fossa CMA  May 14, 2010 11:45 AM

## 2010-08-14 NOTE — Assessment & Plan Note (Signed)
Summary: EST MEDICARE (YEARLY)//PH   Vital Signs:  Patient profile:   70 year old male Height:      70 inches Weight:      195 pounds BMI:     28.08 Pulse rate:   74 / minute BP sitting:   124 / 80  Vitals Entered By: Shary Decamp (October 27, 2009 8:05 AM)  History of Present Illness: Allergic rhinitis-- good medication compliance, symptoms relatively well controlled  h/o CAD-- does not see cardiology, asx  Hyperlipidemia-- good medication compliance  Hypertension-- good medication compliance, ambulatory BPs 120s uses valium and diclofenac (at least one a day) prn for back pain-shoulder pain.No GI s/e  yearly, chart reviewed   Allergies: 1)  ! Ultram 2)  ! Vicodin  Past History:  Past Medical History: Allergic rhinitis Coronary artery disease GERD Hyperlipidemia Hypertension HYPERGLYCEMIA uses valium prn for back pain-shoulder pain. Occ uses for insomnia  HOH, has a hearing aid L, sees audiology routinely   Past Surgical History: Reviewed history from 08/07/2006 and no changes required. Coronary artery bypass graft (1995)  Family History: Reviewed history from 10/28/2008 and no changes required. colon cancer--no prostate cancer--no hypertension -- F MI-- 2 brothers CABG CHF--F  DM-- no  Social History: Married one child born in [place] , has a large extended family , oldest of several brothers-sister  retired, breath delivery truck driver Tobacco-- quit in the 80s ETOH-- no diet-- healthy  exercise-- limited by back pain   Review of Systems General:  Denies fatigue and fever; has lost 2 pounds . CV:  Denies chest pain or discomfort and swelling of feet. Resp:  Denies cough and shortness of breath. GI:  Denies bloody stools and diarrhea. GU:  Denies dysuria, hematuria, urinary frequency, and urinary hesitancy.  Physical Exam  General:  alert and well-developed.   Neck:  no thyromegaly and normal carotid upstroke.   Lungs:  normal respiratory  effort, no intercostal retractions, no accessory muscle use, and normal breath sounds.   Heart:  normal rate, regular rhythm, and no murmur.   Abdomen:  soft, non-tender, no distention, no masses, no guarding, and no rigidity.   no mass , no bruit  Rectal:  No external abnormalities noted. Normal sphincter tone. No rectal masses or tenderness. Prostate:  Prostate gland firm and smooth, no enlargement, nodularity, tenderness, mass, asymmetry or induration. Extremities:  no pretibial edema bilaterally    Impression & Recommendations:  Problem # 1:  HYPERLIPIDEMIA (ICD-272.4) labs His updated medication list for this problem includes:    Niaspan 1000 Mg Tbcr (Niacin (antihyperlipidemic)) .Marland Kitchen... 1 by mouth at bedtime    Zocor 40 Mg Tabs (Simvastatin) .Marland Kitchen... Take 1 tablet by mouth at bedtim  Labs Reviewed: SGOT: 23 (04/28/2009)   SGPT: 26 (04/28/2009)   HDL:37.00 (10/28/2008), 30.7 (03/28/2008)  LDL:62 (10/28/2008), 66 (03/28/2008)  Chol:117 (10/28/2008), 114 (03/28/2008)  Trig:90.0 (10/28/2008), 85 (03/28/2008)  Orders: Venipuncture (16109) TLB-Lipid Panel (80061-LIPID) TLB-ALT (SGPT) (84460-ALT) TLB-AST (SGOT) (84450-SGOT)  Problem # 2:  HEALTH SCREENING (ICD-V70.0) Td 08 pneumonia shot 08 Cscope  8-04 ,  10-05,12-10 , next 2015 check a  PSA      Problem # 3:  HYPERTENSION (ICD-401.9) at goal  His updated medication list for this problem includes:    Amlodipine Besylate 5 Mg Tabs (Amlodipine besylate) .Marland Kitchen... 1 by mouth qd    Benazepril Hcl 20 Mg Tabs (Benazepril hcl) .Marland Kitchen... 1 by mouth two times a day    Atenolol 50 Mg Tabs (Atenolol) .Marland Kitchen... Take  1 1/2 tablet by mouth once a day    BP today: 124/80 Prior BP: 170/84 (04/28/2009)  Labs Reviewed: K+: 3.8 (04/28/2009) Creat: : 1.3 (04/28/2009)   Chol: 117 (10/28/2008)   HDL: 37.00 (10/28/2008)   LDL: 62 (10/28/2008)   TG: 90.0 (10/28/2008)  Orders: TLB-BMP (Basic Metabolic Panel-BMET) (80048-METABOL) TLB-TSH (Thyroid  Stimulating Hormone) (84443-TSH)  Problem # 4:  CORONARY ARTERY DISEASE (ICD-414.00) does not see cardiology routinely, asymptomatic Plan: Continue managing risk factors His updated medication list for this problem includes:    Amlodipine Besylate 5 Mg Tabs (Amlodipine besylate) .Marland Kitchen... 1 by mouth qd    Benazepril Hcl 20 Mg Tabs (Benazepril hcl) .Marland Kitchen... 1 by mouth two times a day    Atenolol 50 Mg Tabs (Atenolol) .Marland Kitchen... Take 1 1/2 tablet by mouth once a day    Aspirin 81 Mg Tbec (Aspirin) .Marland Kitchen... 1 daily  Orders: TLB-CBC Platelet - w/Differential (85025-CBCD)  Problem # 5:  HYPERGLYCEMIA (ICD-790.6) last hemoglobin A1c less than 5.  Recheck on  return to the office  Problem # 6:  GERD (ICD-530.81) well controlled with Nexium p.r.n. His updated medication list for this problem includes:    Nexium 40 Mg Cpdr (Esomeprazole magnesium) .Marland Kitchen... Take 1 capsule by mouth once a day as needed  Complete Medication List: 1)  Amlodipine Besylate 5 Mg Tabs (Amlodipine besylate) .Marland Kitchen.. 1 by mouth qd 2)  Benazepril Hcl 20 Mg Tabs (Benazepril hcl) .Marland Kitchen.. 1 by mouth two times a day 3)  Atenolol 50 Mg Tabs (Atenolol) .... Take 1 1/2 tablet by mouth once a day 4)  Niaspan 1000 Mg Tbcr (Niacin (antihyperlipidemic)) .Marland Kitchen.. 1 by mouth at bedtime 5)  Zocor 40 Mg Tabs (Simvastatin) .... Take 1 tablet by mouth at bedtime 6)  Valium 10 Mg Tabs (Diazepam) .... Take 1 tablet by mouth twice a day as needed back spasm/pain 7)  Allegra 180 Mg Tabs (Fexofenadine hcl) .... Take 1 tablet by mouth once a day 8)  Nexium 40 Mg Cpdr (Esomeprazole magnesium) .... Take 1 capsule by mouth once a day as needed 9)  Diclofenac Sodium 50 Mg Tbec (Diclofenac sodium) .Marland Kitchen.. 1 by mouth three times a day as needed 10)  Aspirin 81 Mg Tbec (Aspirin) .Marland Kitchen.. 1 daily  Other Orders: TLB-PSA (Prostate Specific Antigen) (84153-PSA)  Patient Instructions: 1)  Please schedule a follow-up appointment in 6 months .  Prescriptions: AMLODIPINE BESYLATE 5 MG   TABS (AMLODIPINE BESYLATE) 1 by mouth qd  #90 x 3   Entered by:   Shary Decamp   Authorized by:   Nolon Rod. Rayven Rettig MD   Signed by:   Shary Decamp on 10/27/2009   Method used:   Reprint   RxID:   [id number] NEXIUM 40 MG CPDR (ESOMEPRAZOLE MAGNESIUM) Take 1 capsule by mouth once a day as needed  #90 x 3   Entered by:   Shary Decamp   Authorized by:   Nolon Rod. Adaya Garmany MD   Signed by:   Shary Decamp on 10/27/2009   Method used:   Reprint   RxID:   [id number] BENAZEPRIL HCL 20 MG  TABS (BENAZEPRIL HCL) 1 by mouth two times a day  #180 x 3   Entered by:   Shary Decamp   Authorized by:   Nolon Rod. Jaelee Laughter MD   Signed by:   Shary Decamp on 10/27/2009   Method used:   Reprint   RxID:   [id number] ZOCOR 40 MG TABS (SIMVASTATIN) Take 1 tablet by mouth at bedtime  #  90 x 3   Entered by:   Shary Decamp   Authorized by:   Nolon Rod. Jovontae Banko MD   Signed by:   Shary Decamp on 10/27/2009   Method used:   Reprint   RxID:   [id number] ATENOLOL 50 MG TABS (ATENOLOL) Take 1 1/2 tablet by mouth once a day  #135 x 3   Entered by:   Shary Decamp   Authorized by:   Nolon Rod. Ameriah Lint MD   Signed by:   Shary Decamp on 10/27/2009   Method used:   Reprint   RxID:   [id number] NIASPAN 1000 MG TBCR (NIACIN (ANTIHYPERLIPIDEMIC)) 1 by mouth at bedtime  #90 x 3   Entered by:   Shary Decamp   Authorized by:   Nolon Rod. Chelbie Jarnagin MD   Signed by:   Shary Decamp on 10/27/2009   Method used:   Reprint   RxID:   [id number] BENAZEPRIL HCL 20 MG  TABS (BENAZEPRIL HCL) 1 by mouth two times a day  #180 x 3   Entered by:   Shary Decamp   Authorized by:   Nolon Rod. Delphia Kaylor MD   Signed by:   Shary Decamp on 10/27/2009   Method used:   Electronically to        [facility]* (retail)       [address]       [place], [place]  [address]       Ph: [phone]       Fax: [phone]   RxID:   [id number] AMLODIPINE BESYLATE 5 MG  TABS (AMLODIPINE BESYLATE) 1 by mouth qd  #90 x 3   Entered by:   Shary Decamp   Authorized  by:   Nolon Rod. Jaylean Buenaventura MD   Signed by:   Shary Decamp on 10/27/2009   Method used:   Electronically to        [facility]* (retail)       [address]       [place], [place]  [address]       Ph: [phone]       Fax: [phone]   RxID:   [id number] NEXIUM 40 MG CPDR (ESOMEPRAZOLE MAGNESIUM) Take 1 capsule by mouth once a day as needed  #90 x 3   Entered by:   Shary Decamp   Authorized by:   Nolon Rod. Roben Schliep MD   Signed by:   Shary Decamp on 10/27/2009   Method used:   Electronically to        [facility]* (retail)       [address]       [place], [place]  [address]       Ph: [phone]       Fax: [phone]   RxID:   [id number] ZOCOR 40 MG TABS (SIMVASTATIN) Take 1 tablet by mouth at bedtime  #90 x 3   Entered by:   Shary Decamp   Authorized by:   Nolon Rod. Daevon Holdren MD   Signed by:   Shary Decamp on 10/27/2009   Method used:   Electronically to        [facility]* (retail)       [address]       [place], [place]  [address]       Ph: [phone]       Fax: [phone]   RxID:   [id number] ATENOLOL 50 MG TABS (ATENOLOL) Take 1 1/2 tablet by mouth once a day  #135 x 3   Entered  by:   Shary Decamp   Authorized by:   Nolon Rod. Aleya Durnell MD   Signed by:   Shary Decamp on 10/27/2009   Method used:   Electronically to        [facility]* (retail)       [address]       [place], [place]  [address]       Ph: [phone]       Fax: [phone]   RxID:   [id number] NIASPAN 1000 MG TBCR (NIACIN (ANTIHYPERLIPIDEMIC)) 1 by mouth at bedtime  #90 x 3   Entered by:   Shary Decamp   Authorized by:   Nolon Rod. Telvin Reinders MD   Signed by:   Shary Decamp on 10/27/2009   Method used:   Electronically to        [facility]* (retail)       [address]       [place], [place]  [address]       Ph: [phone]       Fax: [phone]   RxID:   [id number]    Preventive Care Screening  Prior Values:    PSA:  1.46 (10/28/2008)    Colonoscopy:  DONE (06/26/2009)    Last  Tetanus Booster:  Td (11/13/2006)    Last Flu Shot:  Fluvax 3+ (04/28/2009)    Last Pneumovax:  Pneumovax (11/13/2006)

## 2010-09-03 ENCOUNTER — Ambulatory Visit (INDEPENDENT_AMBULATORY_CARE_PROVIDER_SITE_OTHER): Payer: Medicare Other | Admitting: Internal Medicine

## 2010-09-03 ENCOUNTER — Encounter: Payer: Self-pay | Admitting: Internal Medicine

## 2010-09-03 DIAGNOSIS — M542 Cervicalgia: Secondary | ICD-10-CM
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
## 2010-09-19 ENCOUNTER — Encounter: Payer: Self-pay | Admitting: Internal Medicine

## 2010-10-04 ENCOUNTER — Other Ambulatory Visit: Payer: Self-pay | Admitting: Internal Medicine

## 2010-10-04 MED ORDER — DIAZEPAM 10 MG PO TABS: 10.0000 mg | ORAL_TABLET | Freq: Two times a day (BID) | ORAL | Status: DC | PRN

## 2010-10-04 NOTE — Telephone Encounter (Signed)
Ok 60, 3 RF 

## 2010-10-31 ENCOUNTER — Encounter: Payer: Self-pay | Admitting: Internal Medicine

## 2010-10-31 ENCOUNTER — Ambulatory Visit (INDEPENDENT_AMBULATORY_CARE_PROVIDER_SITE_OTHER): Payer: Medicare Other | Admitting: Internal Medicine

## 2010-10-31 DIAGNOSIS — I10 Essential (primary) hypertension: Secondary | ICD-10-CM

## 2010-10-31 DIAGNOSIS — M2550 Pain in unspecified joint: Secondary | ICD-10-CM

## 2010-10-31 DIAGNOSIS — R7989 Other specified abnormal findings of blood chemistry: Secondary | ICD-10-CM

## 2010-10-31 LAB — HEMOGLOBIN A1C: Hgb A1c MFr Bld: 5.8 % (ref 4.6–6.5)

## 2010-10-31 LAB — BASIC METABOLIC PANEL
Chloride: 106 mEq/L (ref 96–112)
Potassium: 4.3 mEq/L (ref 3.5–5.1)
Sodium: 142 mEq/L (ref 135–145)
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
## 2010-10-31 NOTE — Patient Instructions (Signed)
Heating pad, diclofenac and valium as needed for neck pain
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
## 2010-10-31 NOTE — Assessment & Plan Note (Signed)
Monitoring A1C today

## 2010-10-31 NOTE — Assessment & Plan Note (Signed)
Stable , BMP

## 2010-11-02 ENCOUNTER — Telehealth: Payer: Self-pay | Admitting: *Deleted

## 2010-11-02 DIAGNOSIS — N189 Chronic kidney disease, unspecified: Secondary | ICD-10-CM

## 2010-11-02 NOTE — Telephone Encounter (Signed)
I spoke w/ pt he is aware.  

## 2010-11-02 NOTE — Telephone Encounter (Signed)
Message copied by Army Fossa on Fri Nov 02, 2010  8:53 AM ------      Message from: Willow Ora      Created: Fri Nov 02, 2010  8:44 AM       Advise patient:      DM test stable      Kidney function slt decreased but stable.      Please arrange for a Renal u/s----- dx CRI      F/u as planned, no change in meds

## 2010-11-05 ENCOUNTER — Ambulatory Visit
Admission: RE | Admit: 2010-11-05 | Discharge: 2010-11-05 | Disposition: A | Discharge status: Home / Self Care | Payer: Medicare Other | Source: Ambulatory Visit | Attending: Internal Medicine | Admitting: Internal Medicine

## 2010-11-05 ENCOUNTER — Other Ambulatory Visit: Payer: Self-pay | Admitting: *Deleted

## 2010-11-05 DIAGNOSIS — N189 Chronic kidney disease, unspecified: Secondary | ICD-10-CM

## 2010-11-05 MED ORDER — NIACIN ER (ANTIHYPERLIPIDEMIC) 1000 MG PO TBCR: 1000.0000 mg | EXTENDED_RELEASE_TABLET | Freq: Every day | ORAL | Status: DC

## 2010-11-06 ENCOUNTER — Other Ambulatory Visit: Payer: Medicare Other

## 2010-11-06 ENCOUNTER — Telehealth: Payer: Self-pay | Admitting: *Deleted

## 2010-11-06 NOTE — Telephone Encounter (Signed)
Message copied by Army Fossa on Tue Nov 06, 2010  8:41 AM ------      Message from: Dustin Ashley      Created: Mon Nov 05, 2010  5:19 PM       Advise patient:      Renal ultrasound ok. Plan is the same

## 2010-11-06 NOTE — Telephone Encounter (Signed)
I spoke w/ Dustin Ashley she is aware.

## 2010-11-06 NOTE — Telephone Encounter (Signed)
Message left for patient to return my call.  

## 2010-11-30 ENCOUNTER — Other Ambulatory Visit: Payer: Self-pay | Admitting: *Deleted

## 2010-11-30 MED ORDER — ATENOLOL 50 MG PO TABS: ORAL_TABLET | ORAL | Status: DC

## 2010-12-07 ENCOUNTER — Other Ambulatory Visit: Payer: Self-pay | Admitting: *Deleted

## 2010-12-07 MED ORDER — SIMVASTATIN 40 MG PO TABS: 40.0000 mg | ORAL_TABLET | Freq: Every day | ORAL | Status: DC

## 2010-12-12 ENCOUNTER — Other Ambulatory Visit: Payer: Self-pay | Admitting: Internal Medicine

## 2010-12-12 MED ORDER — AMLODIPINE BESYLATE 5 MG PO TABS: 5.0000 mg | ORAL_TABLET | Freq: Every day | ORAL | Status: DC

## 2010-12-12 NOTE — Telephone Encounter (Signed)
meds sent in

## 2010-12-28 ENCOUNTER — Ambulatory Visit (INDEPENDENT_AMBULATORY_CARE_PROVIDER_SITE_OTHER): Payer: Medicare Other | Admitting: Internal Medicine

## 2010-12-28 ENCOUNTER — Encounter: Payer: Self-pay | Admitting: Internal Medicine

## 2010-12-28 DIAGNOSIS — W57XXXA Bitten or stung by nonvenomous insect and other nonvenomous arthropods, initial encounter: Secondary | ICD-10-CM | POA: Insufficient documentation

## 2010-12-28 DIAGNOSIS — S90569A Insect bite (nonvenomous), unspecified ankle, initial encounter: Secondary | ICD-10-CM

## 2010-12-28 MED ORDER — DOXYCYCLINE HYCLATE 100 MG PO TABS: 100.0000 mg | ORAL_TABLET | Freq: Two times a day (BID) | ORAL | Status: AC

## 2010-12-28 NOTE — Assessment & Plan Note (Addendum)
Recent tick bite  with a rash that is uncharacteristic of Lyme. The patient is quite concerned about tickborne diseases. We agreed to prescribe doxycycline: The patient knows this is likely overkill but prefers to be "on the safe side".

## 2010-12-28 NOTE — Progress Notes (Signed)
  Subjective:    Patient ID: Dustin Ashley, male    DOB: [birth year], 70 y.o.   MRN: [id number]  HPI Found a tick in his left leg few days ago. Initially there was a diminutive rash and in the last 2 days Dustin Ashley has expanded. Picked up the tick @ his yard.  Past Medical History  Diagnosis Date  . HOH (hard of hearing)     has a hearing aid L, sees audiology rountinely  . Allergic rhinitis   . CAD (coronary artery disease)   . GERD (gastroesophageal reflux disease)   . Hyperlipemia   . Hypertension   . Hyperglycemia     A1C 5.8 04-2010  . Pain, joint, multiple sites     uses valium, occ uses for insomnia. uses for shoulder  pain   Past Surgical History  Procedure Date  . Coronary artery bypass graft 1995    Review of Systems No fevers, headaches. No aches or pains No rashes anywhere else      Objective:   Physical Exam Alert, oriented, in no apparent distress. Lower extremities without edema Skin: On the left pretibial area he has a 1 cm irregular shaped, macular redness.        Assessment & Plan:

## 2011-01-21 ENCOUNTER — Other Ambulatory Visit: Payer: Self-pay | Admitting: Internal Medicine

## 2011-01-21 MED ORDER — BENAZEPRIL HCL 20 MG PO TABS: 20.0000 mg | ORAL_TABLET | Freq: Two times a day (BID) | ORAL | Status: DC

## 2011-01-21 NOTE — Telephone Encounter (Signed)
Sent in

## 2011-01-30 ENCOUNTER — Encounter: Payer: Medicare Other | Admitting: Internal Medicine

## 2011-02-08 ENCOUNTER — Encounter: Payer: Medicare Other | Admitting: Internal Medicine

## 2011-02-25 ENCOUNTER — Other Ambulatory Visit: Payer: Self-pay | Admitting: Internal Medicine

## 2011-02-25 MED ORDER — ATENOLOL 50 MG PO TABS: ORAL_TABLET | ORAL | Status: DC

## 2011-02-25 MED ORDER — DICLOFENAC SODIUM 50 MG PO TBEC: 50.0000 mg | DELAYED_RELEASE_TABLET | Freq: Three times a day (TID) | ORAL | Status: DC | PRN

## 2011-02-25 NOTE — Telephone Encounter (Signed)
Rx Done . 

## 2011-03-14 ENCOUNTER — Ambulatory Visit (INDEPENDENT_AMBULATORY_CARE_PROVIDER_SITE_OTHER): Payer: Medicare Other | Admitting: Internal Medicine

## 2011-03-14 ENCOUNTER — Encounter: Payer: Self-pay | Admitting: Internal Medicine

## 2011-03-14 DIAGNOSIS — R7989 Other specified abnormal findings of blood chemistry: Secondary | ICD-10-CM

## 2011-03-14 DIAGNOSIS — R3129 Other microscopic hematuria: Secondary | ICD-10-CM

## 2011-03-14 DIAGNOSIS — I10 Essential (primary) hypertension: Secondary | ICD-10-CM

## 2011-03-14 DIAGNOSIS — E785 Hyperlipidemia, unspecified: Secondary | ICD-10-CM

## 2011-03-14 DIAGNOSIS — M2550 Pain in unspecified joint: Secondary | ICD-10-CM

## 2011-03-14 DIAGNOSIS — I2510 Atherosclerotic heart disease of native coronary artery without angina pectoris: Secondary | ICD-10-CM

## 2011-03-14 DIAGNOSIS — N182 Chronic kidney disease, stage 2 (mild): Secondary | ICD-10-CM

## 2011-03-14 DIAGNOSIS — Z125 Encounter for screening for malignant neoplasm of prostate: Secondary | ICD-10-CM

## 2011-03-14 DIAGNOSIS — Z0000 Encounter for general adult medical examination without abnormal findings: Secondary | ICD-10-CM

## 2011-03-14 LAB — CBC WITH DIFFERENTIAL/PLATELET
Basophils Absolute: 0.1 10*3/uL (ref 0.0–0.1)
Eosinophils Absolute: 0.5 10*3/uL (ref 0.0–0.7)
Eosinophils Relative: 5.4 % — ABNORMAL HIGH (ref 0.0–5.0)
Lymphs Abs: 2.3 10*3/uL (ref 0.7–4.0)
MCHC: 33.5 g/dL (ref 30.0–36.0)
MCV: 89.6 fl (ref 78.0–100.0)
Monocytes Absolute: 0.7 10*3/uL (ref 0.1–1.0)
Neutrophils Relative %: 60.3 % (ref 43.0–77.0)
Platelets: 150 10*3/uL (ref 150.0–400.0)
RDW: 13.7 % (ref 11.5–14.6)
WBC: 9 10*3/uL (ref 4.5–10.5)

## 2011-03-14 LAB — COMPREHENSIVE METABOLIC PANEL
ALT: 18 U/L (ref 0–53)
Albumin: 4.2 g/dL (ref 3.5–5.2)
Alkaline Phosphatase: 78 U/L (ref 39–117)
Glucose, Bld: 110 mg/dL — ABNORMAL HIGH (ref 70–99)
Potassium: 4.7 mEq/L (ref 3.5–5.1)
Sodium: 144 mEq/L (ref 135–145)
Total Protein: 7 g/dL (ref 6.0–8.3)

## 2011-03-14 LAB — POCT URINALYSIS DIPSTICK
Leukocytes, UA: NEGATIVE
Protein, UA: NEGATIVE
Spec Grav, UA: 1.01
Urobilinogen, UA: 0.2

## 2011-03-14 LAB — URINALYSIS
Bilirubin Urine: NEGATIVE
Hgb urine dipstick: NEGATIVE
Leukocytes, UA: NEGATIVE
Nitrite: NEGATIVE
Urobilinogen, UA: 0.2 (ref 0.0–1.0)

## 2011-03-14 LAB — LIPID PANEL
Cholesterol: 116 mg/dL (ref 0–200)
LDL Cholesterol: 54 mg/dL (ref 0–99)

## 2011-03-14 LAB — PSA: PSA: 1.41 ng/mL (ref 0.10–4.00)

## 2011-03-14 MED ORDER — ZOSTER VACCINE LIVE 19400 UNT/0.65ML ~~LOC~~ SOLR: 0.6500 mL | Freq: Once | SUBCUTANEOUS | Status: DC

## 2011-03-14 NOTE — Assessment & Plan Note (Addendum)
Td 08 pneumonia shot 08 zostavax-- benefits discussed , Rx provided rec flu shot this year Cscope  8-04 ,  10-05,12-10 , next 2015 check a  PSA Diet exercise doing great, has cut down on carbs

## 2011-03-14 NOTE — Assessment & Plan Note (Signed)
Good medication compliance, labs 

## 2011-03-14 NOTE — Patient Instructions (Signed)
If you need diclofenac more often than once a week, let me know

## 2011-03-14 NOTE — Assessment & Plan Note (Signed)
Last A1c stable

## 2011-03-14 NOTE — Assessment & Plan Note (Addendum)
Creatinine has fluctuated between 1 and 1..6 since 2008. Ultrasound of the kidneys 10-2010 normal except for a thick bladder which was previously seen. I will check a UA and dip  to rule out microhematuria. Should he have microhematuria, he will need a referral to urology. Also, okay to take diclofenac once a week as long as the kidney function is stable.

## 2011-03-14 NOTE — Progress Notes (Signed)
Subjective:    Patient ID: Dustin Ashley, male    DOB: July 19, 1940, 70 y.o.   MRN: [id number]  HPI  Here for Medicare AWV:  1. Risk factors based on Past M, S, F history: reviewed 2. Physical Activities:active, yard and home chores ; unable to take walks d/t hip pain  3. Depression/mood:  No problems noted or reported 4. Hearing: due to see audiologist  5. ADL's:  Independent  6. Fall Risk:no recent problems, average risk  7. home Safety: does feel safe at home  8. Height, weight, &visual acuity: see VS, vision ok w/ glasses, just saw the eye doctor  9. Counseling: provided 10. Labs ordered based on risk factors: if needed  11. Referral Coordination: if needed 12.  Care Plan, see assessment and plan  13.   Cognitive Assessment: motor skills and cognition appropriate   In addition, today we discussed the following: HTN--good medication compliance, he quit taking his BPs because they are always good. GERD--good medication compliance, asymptomatic. High cholesterol--good compliance with meds, no myalgias. Has occasional backache, taking Valium twice a week and diclofenac once a week at most. He also had neck pain, saw a chiropractor, doing great.   Past Medical History  Diagnosis Date  . HOH (hard of hearing)     has a hearing aid L, sees audiology rountinely  . Allergic rhinitis   . CAD (coronary artery disease)   . GERD (gastroesophageal reflux disease)   . Hyperlipemia   . Hypertension   . Hyperglycemia     A1C 5.8 04-2010  . Pain, joint, multiple sites     uses valium, occ uses for insomnia. uses for shoulder  pain  . Chronic renal insufficiency, stage II (mild)     Cr ~ 1.4, u/s 4-12 normal kidneys   Past Surgical History  Procedure Date  . Coronary artery bypass graft 1995   History   Social History  . Marital Status: Married    Spouse Name: N/A    Number of Children: 1  . Years of Education: N/A   Occupational History  . retired, delivery truck Hospital doctor      Social History Main Topics  . Smoking status: Former Smoker    Quit date: 03/13/1976  . Smokeless tobacco: Never Used  . Alcohol Use: No  . Drug Use: No  . Sexually Active: Not on file   Other Topics Concern  . Not on file   Social History Narrative   Born in [place], has a large extended family, oldest of several brothers-sister-------Diet- healthy------Exercise- limited by back pain     Family History  Problem Relation Age of Onset  . Hypertension Father   . Heart attack      2 brothers, CABG  . Cancer Neg Hx     colon, prostate  . Diabetes Neg Hx      Review of Systems  Constitutional: Negative for fever and fatigue.  Respiratory: Negative for cough, shortness of breath and wheezing.   Cardiovascular: Negative for chest pain and leg swelling.  Gastrointestinal: Negative for abdominal pain and blood in stool.  Genitourinary: Negative for dysuria, hematuria and difficulty urinating.       Objective:   Physical Exam  Constitutional: He is oriented to person, place, and time. He appears well-developed and well-nourished. No distress.  HENT:  Head: Normocephalic and atraumatic.  Neck: No thyromegaly present.       Normal carotid pulses  Cardiovascular: Normal rate, regular rhythm and normal heart sounds.  No murmur heard. Pulmonary/Chest: Effort normal and breath sounds normal. No respiratory distress. He has no wheezes. He has no rales.  Abdominal: Soft. He exhibits no distension. There is no tenderness. There is no rebound and no guarding.  Genitourinary: Rectum normal and prostate normal.  Musculoskeletal: He exhibits no edema.  Neurological: He is alert and oriented to person, place, and time.  Skin: Skin is warm and dry. He is not diaphoretic.  Psychiatric: He has a normal mood and affect. His behavior is normal. Judgment and thought content normal.          Assessment & Plan:

## 2011-03-14 NOTE — Assessment & Plan Note (Signed)
Asymptomatic, continue her present care

## 2011-03-14 NOTE — Assessment & Plan Note (Signed)
Well-controlled, no change 

## 2011-03-14 NOTE — Assessment & Plan Note (Signed)
Saw a chiropractor: Neck pains and headaches improved. Uses Valium on average twice a week and Voltaren on average once a week.

## 2011-04-05 ENCOUNTER — Other Ambulatory Visit: Payer: Self-pay | Admitting: Internal Medicine

## 2011-04-05 MED ORDER — ESOMEPRAZOLE MAGNESIUM 40 MG PO CPDR: 40.0000 mg | DELAYED_RELEASE_CAPSULE | Freq: Every day | ORAL | Status: DC | PRN

## 2011-04-05 NOTE — Telephone Encounter (Signed)
rx sent

## 2011-04-16 ENCOUNTER — Other Ambulatory Visit: Payer: Self-pay | Admitting: Internal Medicine

## 2011-04-16 MED ORDER — BENAZEPRIL HCL 20 MG PO TABS: 20.0000 mg | ORAL_TABLET | Freq: Two times a day (BID) | ORAL | Status: DC

## 2011-04-16 MED ORDER — AMLODIPINE BESYLATE 5 MG PO TABS: 5.0000 mg | ORAL_TABLET | Freq: Every day | ORAL | Status: DC

## 2011-04-16 NOTE — Telephone Encounter (Signed)
Rx[s] Done. 

## 2011-05-01 ENCOUNTER — Other Ambulatory Visit: Payer: Self-pay | Admitting: Internal Medicine

## 2011-05-01 MED ORDER — NIACIN ER (ANTIHYPERLIPIDEMIC) 1000 MG PO TBCR: 1000.0000 mg | EXTENDED_RELEASE_TABLET | Freq: Every day | ORAL | Status: DC

## 2011-05-01 NOTE — Telephone Encounter (Signed)
Done

## 2011-05-06 ENCOUNTER — Other Ambulatory Visit: Payer: Self-pay | Admitting: Internal Medicine

## 2011-05-06 MED ORDER — ESOMEPRAZOLE MAGNESIUM 40 MG PO CPDR: 40.0000 mg | DELAYED_RELEASE_CAPSULE | Freq: Every day | ORAL | Status: DC | PRN

## 2011-05-29 ENCOUNTER — Other Ambulatory Visit: Payer: Self-pay | Admitting: Internal Medicine

## 2011-05-30 MED ORDER — ATENOLOL 50 MG PO TABS: ORAL_TABLET | ORAL | Status: DC

## 2011-05-30 NOTE — Telephone Encounter (Signed)
Rx for atenolol sent to [facility]

## 2011-06-04 ENCOUNTER — Other Ambulatory Visit: Payer: Self-pay | Admitting: Internal Medicine

## 2011-06-04 MED ORDER — SIMVASTATIN 40 MG PO TABS: 40.0000 mg | ORAL_TABLET | Freq: Every day | ORAL | Status: DC

## 2011-06-04 NOTE — Telephone Encounter (Signed)
done

## 2011-07-25 ENCOUNTER — Other Ambulatory Visit: Payer: Self-pay | Admitting: Internal Medicine

## 2011-07-25 MED ORDER — AMLODIPINE BESYLATE 5 MG PO TABS: 5.0000 mg | ORAL_TABLET | Freq: Every day | ORAL | Status: DC

## 2011-07-25 NOTE — Telephone Encounter (Signed)
RX sent to pharmacy  

## 2011-07-29 ENCOUNTER — Other Ambulatory Visit: Payer: Self-pay | Admitting: Internal Medicine

## 2011-07-29 MED ORDER — DIAZEPAM 10 MG PO TABS: 10.0000 mg | ORAL_TABLET | Freq: Two times a day (BID) | ORAL | Status: DC | PRN

## 2011-07-29 NOTE — Telephone Encounter (Signed)
Last OV 03-14-11, last Filled 10-04-10 #60 3

## 2011-07-29 NOTE — Telephone Encounter (Signed)
Rx sent 

## 2011-07-29 NOTE — Telephone Encounter (Signed)
Ok 60, 3 RF 

## 2011-09-13 ENCOUNTER — Ambulatory Visit: Payer: Medicare Other | Admitting: Internal Medicine

## 2011-09-23 ENCOUNTER — Telehealth: Payer: Self-pay | Admitting: Internal Medicine

## 2011-09-23 MED ORDER — ATENOLOL 50 MG PO TABS: ORAL_TABLET | ORAL | Status: DC

## 2011-09-23 NOTE — Telephone Encounter (Signed)
Refill  Atenolo 50MG  Tab #45  Last fill date 2-13  Take 1-1/2 tablets by mouth daly

## 2011-09-23 NOTE — Telephone Encounter (Signed)
Rx sent 

## 2011-09-25 ENCOUNTER — Ambulatory Visit (INDEPENDENT_AMBULATORY_CARE_PROVIDER_SITE_OTHER): Payer: Medicare Other | Admitting: Internal Medicine

## 2011-09-25 VITALS — BP 140/80 | HR 59 | Temp 98.3°F | Wt 193.0 lb

## 2011-09-25 DIAGNOSIS — L851 Acquired keratosis [keratoderma] palmaris et plantaris: Secondary | ICD-10-CM

## 2011-09-25 DIAGNOSIS — R21 Rash and other nonspecific skin eruption: Secondary | ICD-10-CM

## 2011-09-25 MED ORDER — BETAMETHASONE DIPROPIONATE AUG 0.05 % EX LOTN: TOPICAL_LOTION | Freq: Two times a day (BID) | CUTANEOUS | Status: DC

## 2011-09-25 NOTE — Progress Notes (Signed)
  Subjective:    Patient ID: Dustin Ashley, male    DOB: [birth year], 71 y.o.   MRN: [id number]  HPI Acute visit Symptoms started several months ago on the right pretibial area then both legs. He developed a itchy rash that gets slightly better with over-the-counter moisturizer and  apparently with hydrocortisone. Symptoms were first noted when he was in a hotel.  Past Medical History  Diagnosis Date  . HOH (hard of hearing)     has a hearing aid L, sees audiology rountinely  . Allergic rhinitis   . CAD (coronary artery disease)   . GERD (gastroesophageal reflux disease)   . Hyperlipemia   . Hypertension   . Hyperglycemia     A1C 5.8 04-2010  . Pain, joint, multiple sites     uses valium, occ uses for insomnia. uses for shoulder  pain  . Chronic renal insufficiency, stage II (mild)     Cr ~ 1.4, u/s 4-12 normal kidneys     Review of Systems Denies the use of the new medicines No fever chills Arthralgias In general has noted severely dry skin particularly at the left arm. Note lower extremity edema     Objective:   Physical Exam Alert oriented x3, no apparent distress Skin in general is dry. Between the knees and ankles he has several mostly macular, some papular, 1-2 mm erythematous lesions, they're not scaly or blistery. Some are confluent but mostly are independent. Do not have the typical appearance of a petechia.      Assessment & Plan:

## 2011-09-25 NOTE — Patient Instructions (Addendum)
Use diprolene twice a day x at least 10 days, then as needed Avoid  hot showers Use aveeno lotion after showers  Call if sx increase

## 2011-09-26 ENCOUNTER — Encounter: Payer: Self-pay | Admitting: Internal Medicine

## 2011-09-26 NOTE — Assessment & Plan Note (Signed)
See instructions

## 2011-09-26 NOTE — Assessment & Plan Note (Signed)
Pretibial rash and generalized dry skin Uncharacteristic for bed bug. Differential diagnoses includes stasis dermatitis versus vasculitis versus dry skin. Plan: topical steroids, moisturizers, see instructions

## 2011-10-14 ENCOUNTER — Telehealth: Payer: Self-pay | Admitting: Internal Medicine

## 2011-10-14 MED ORDER — BENAZEPRIL HCL 20 MG PO TABS: 20.0000 mg | ORAL_TABLET | Freq: Two times a day (BID) | ORAL | Status: DC

## 2011-10-14 NOTE — Telephone Encounter (Signed)
Refill done.  

## 2011-10-14 NOTE — Telephone Encounter (Signed)
Refill: benazepril hcl 20mg  tab #180. Take 1 tablet by mouth two (2) times daily. Last fill 1.2.13

## 2011-10-23 ENCOUNTER — Telehealth: Payer: Self-pay | Admitting: Internal Medicine

## 2011-10-23 MED ORDER — ATENOLOL 50 MG PO TABS: ORAL_TABLET | ORAL | Status: DC

## 2011-10-23 NOTE — Telephone Encounter (Signed)
Refill done.  

## 2011-10-23 NOTE — Telephone Encounter (Signed)
Refill: Atenolol 50mg  tab #45. Take one and one-half tablets by mouth daily  *Office visit due now* Last fill 09-23-11.

## 2011-10-30 ENCOUNTER — Other Ambulatory Visit: Payer: Self-pay | Admitting: Internal Medicine

## 2011-10-30 MED ORDER — NIACIN ER (ANTIHYPERLIPIDEMIC) 1000 MG PO TBCR: 1000.0000 mg | EXTENDED_RELEASE_TABLET | Freq: Every day | ORAL | Status: DC

## 2011-10-30 NOTE — Telephone Encounter (Signed)
Refill done.  

## 2011-10-30 NOTE — Telephone Encounter (Signed)
Refill for Niaspan 1000MG  Ter #90 Take one tablet by mouth every night at bedtime Last filled 1.14.13, qty 90   Last OV 3.14.13

## 2011-11-26 ENCOUNTER — Telehealth: Payer: Self-pay | Admitting: *Deleted

## 2011-11-26 NOTE — Telephone Encounter (Signed)
Prior Auth approved 11-26-11 until 11-25-12, pharmacy notified via fax.

## 2011-12-02 ENCOUNTER — Other Ambulatory Visit: Payer: Self-pay | Admitting: Internal Medicine

## 2011-12-02 MED ORDER — SIMVASTATIN 40 MG PO TABS: 40.0000 mg | ORAL_TABLET | Freq: Every day | ORAL | Status: DC

## 2011-12-02 NOTE — Telephone Encounter (Signed)
Refill done.  

## 2011-12-02 NOTE — Telephone Encounter (Signed)
Refill for Simvastatin 40mg  tab  Qty 90 Last filled 2.21.13 Take one tablet by mouth every night at bedtime  Last ov 3.13.13

## 2011-12-10 ENCOUNTER — Other Ambulatory Visit: Payer: Self-pay | Admitting: Internal Medicine

## 2011-12-10 MED ORDER — AMLODIPINE BESYLATE 5 MG PO TABS: 5.0000 mg | ORAL_TABLET | Freq: Every day | ORAL | Status: DC

## 2011-12-10 NOTE — Telephone Encounter (Signed)
Refill done.  

## 2011-12-10 NOTE — Telephone Encounter (Signed)
Refill Amlodipine Besylate 5mg  Tab Qty 30 Take one tablet by mouth every day  Last filled 4.27.13 Last ov 3.14.13

## 2012-03-25 ENCOUNTER — Ambulatory Visit (INDEPENDENT_AMBULATORY_CARE_PROVIDER_SITE_OTHER): Payer: Medicare Other | Admitting: Internal Medicine

## 2012-03-25 ENCOUNTER — Encounter: Payer: Self-pay | Admitting: Internal Medicine

## 2012-03-25 VITALS — BP 150/84 | HR 57 | Temp 98.1°F | Ht 68.5 in | Wt 194.0 lb

## 2012-03-25 DIAGNOSIS — N2889 Other specified disorders of kidney and ureter: Secondary | ICD-10-CM

## 2012-03-25 DIAGNOSIS — I10 Essential (primary) hypertension: Secondary | ICD-10-CM

## 2012-03-25 DIAGNOSIS — N182 Chronic kidney disease, stage 2 (mild): Secondary | ICD-10-CM

## 2012-03-25 DIAGNOSIS — R0989 Other specified symptoms and signs involving the circulatory and respiratory systems: Secondary | ICD-10-CM

## 2012-03-25 DIAGNOSIS — R7989 Other specified abnormal findings of blood chemistry: Secondary | ICD-10-CM

## 2012-03-25 DIAGNOSIS — I519 Heart disease, unspecified: Secondary | ICD-10-CM

## 2012-03-25 DIAGNOSIS — I2510 Atherosclerotic heart disease of native coronary artery without angina pectoris: Secondary | ICD-10-CM

## 2012-03-25 DIAGNOSIS — Z23 Encounter for immunization: Secondary | ICD-10-CM

## 2012-03-25 DIAGNOSIS — Z0000 Encounter for general adult medical examination without abnormal findings: Secondary | ICD-10-CM

## 2012-03-25 DIAGNOSIS — E785 Hyperlipidemia, unspecified: Secondary | ICD-10-CM

## 2012-03-25 LAB — AST: AST: 22 U/L (ref 0–37)

## 2012-03-25 LAB — LIPID PANEL
HDL: 44.2 mg/dL (ref >39.00)
Total CHOL/HDL Ratio: 3

## 2012-03-25 LAB — CBC WITH DIFFERENTIAL/PLATELET
Basophils Relative: 0.6 % (ref 0.0–3.0)
Eosinophils Absolute: 0.5 10*3/uL (ref 0.0–0.7)
MCHC: 32.9 g/dL (ref 30.0–36.0)
MCV: 90.2 fl (ref 78.0–100.0)
Monocytes Absolute: 0.7 10*3/uL (ref 0.1–1.0)
Neutrophils Relative %: 63.4 % (ref 43.0–77.0)
RBC: 4.85 Mil/uL (ref 4.22–5.81)

## 2012-03-25 LAB — HEMOGLOBIN A1C: Hgb A1c MFr Bld: 5.5 % (ref 4.6–6.5)

## 2012-03-25 LAB — BASIC METABOLIC PANEL
BUN: 23 mg/dL (ref 6–23)
CO2: 29 mEq/L (ref 19–32)
Chloride: 105 mEq/L (ref 96–112)
Creatinine, Ser: 1.4 mg/dL (ref 0.4–1.5)
Glucose, Bld: 104 mg/dL — ABNORMAL HIGH (ref 70–99)

## 2012-03-25 MED ORDER — ATORVASTATIN CALCIUM 20 MG PO TABS: 20.0000 mg | ORAL_TABLET | Freq: Every day | ORAL | Status: DC

## 2012-03-25 MED ORDER — AMLODIPINE BESYLATE 10 MG PO TABS: 10.0000 mg | ORAL_TABLET | Freq: Every day | ORAL | Status: DC

## 2012-03-25 NOTE — Assessment & Plan Note (Signed)
Recheck a BMP today, creatinine  has been as high as 1.6. Normal ultrasound of the kidneys 10/2010 Today and noted a abdominal bruit. Will check a aorta ultrasound and a renal artery duplex. Consider nephrology consult.

## 2012-03-25 NOTE — Patient Instructions (Signed)
Increase amlodipine from 5 mg to 10 mg daily Check the  blood pressure 2 or 3 times a week, be sure it is between 110/60 and 130/85. If it is consistently higher or lower, let me know ----- Stop simvastatin, start atorvastatin 20 mg every night ---- Next visit in 3-4 months, fasting. Call if problems.

## 2012-03-25 NOTE — Progress Notes (Signed)
Subjective:    Patient ID: Dustin Ashley, male    DOB: [birth year], 71 y.o.   MRN: [id number]  HPI  Here for Medicare AWV: 1. Risk factors based on Past M, S, F history: reviewed 2. Physical Activities: active, yard and home chores; unable to take walks d/t hip pain   3. Depression/mood:  No problems noted or reported 4. Hearing: due to see audiologist   5. ADL's:  Independent   6. Fall Risk: prevention discussed   7. home Safety: does feel safe at home   8. Height, weight, & visual acuity: see VS, vision ok w/ glasses, just saw the eye doctor   9. Counseling: provided 10. Labs ordered based on risk factors: if needed   11. Referral Coordination: if needed 12.  Care Plan, see assessment and plan   13.   Cognitive Assessment: motor skills and cognition appropriate   In addition, today we discussed the following: High cholesterol, good medication compliance, occasionally flushed with niacin Hypertension, good medication compliance, ambulatory BP ranged from 130-140/70. History of chronic back pain, having on and off issues as usual, no worse than before.  Past Medical History: CAD. Hyperlipidemia Hypertension HYPERGLYCEMIA CRI , Cr ~ 1.4 , u/s renal 10-2010 normal Allergic rhinitis GERD uses valium prn for back pain-shoulder pain. 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 Unable to determine if has claudication since he does not walk much.  No  nausea, vomiting, diarrhea. No postprandial abdominal pain. No dysuria or gross hematuria. No difficulty urinating.  Current Outpatient Rx  Name Route Sig Dispense Refill  . ASPIRIN 81 MG PO TABS Oral Take 81 mg by mouth daily.      . ATENOLOL 50 MG PO TABS  Take 1 1/2 tablets by mouth once a day. 45 tablet 4  . BENAZEPRIL HCL 20 MG PO TABS Oral Take 1 tablet (20 mg total) by mouth 2 (two) times daily. 180 tablet 1  . DIAZEPAM 10 MG PO TABS Oral Take 1 tablet (10 mg total) by mouth every 12 (twelve) hours as needed (spasm/pain). Take 1 tablet by mouth twice a day as needed back spasm/pain 60 tablet 3  . DICLOFENAC SODIUM 50 MG PO TBEC Oral Take 1 tablet (50 mg total) by mouth 3 (three) times daily as needed. 90 tablet 3  . ESOMEPRAZOLE MAGNESIUM 40 MG PO CPDR Oral Take 1 capsule (40 mg total) by mouth daily as needed. 90 capsule 1  . FEXOFENADINE HCL 180 MG PO TABS Oral Take 180 mg by mouth daily.      Marland Kitchen NIACIN ER (ANTIHYPERLIPIDEMIC) 1000 MG PO TBCR Oral Take 1 tablet (1,000 mg total) by mouth at bedtime. 90 tablet 1  . AMLODIPINE BESYLATE 10 MG PO TABS Oral Take 1 tablet (10 mg total) by mouth daily. 90 tablet 3  . ATORVASTATIN CALCIUM 20 MG PO TABS Oral Take 1 tablet (20 mg total) by mouth daily. 90 tablet 3  Objective:   Physical Exam  General -- alert, well-developed   Neck --no thyromegaly , normal carotid pulse, no LADs Lungs -- normal respiratory effort, no intercostal retractions, no accessory muscle use, and normal breath sounds.   Heart-- normal rate, regular rhythm, no murmur, and no gallop.   Abdomen--soft, non-tender, no distention, no masses; has appropriate at the upper abdomen without masses..   Extremities-- no pretibial edema bilaterally, normal femoral pulses Neurologic-- alert & oriented X3 and strength normal in all extremities. Psych-- Cognition and judgment appear intact. Alert and cooperative with normal attention span and concentration.  not anxious  appearing and not depressed appearing.       Assessment & Plan:

## 2012-03-25 NOTE — Assessment & Plan Note (Signed)
Ambulatory BPs range from 130, 140/78. Has some degree of renal insufficiency.  Plan: Increase amlodipine to 10 mg daily, SBP goal 120

## 2012-03-25 NOTE — Assessment & Plan Note (Addendum)
Good medication compliance with simvastatin. Today we are increasing the dose of amlodipine to 10 mg, for that reason will switch simvastatin 4o mg to  Lipitor 20. check FLP, AST, ALT

## 2012-03-25 NOTE — Assessment & Plan Note (Signed)
Check a A1C 

## 2012-03-25 NOTE — Assessment & Plan Note (Addendum)
Td 08 pneumonia shot 08 zostavax had a Rx , has not used it , benefits discussed again, will call if need another Rx  flu shot today Cscope  8-04 ,  10-05,12-10 , next 2015 Reassess need of prostate cancer screening next year Diet exercise -- discussed, water aerobics

## 2012-03-25 NOTE — Assessment & Plan Note (Signed)
Asymptomatic, EKG today showing bradycardia (patient is asymptomatic) and no change from previous EKGs Plan: Continue with cardiovascular risk factors mngmt

## 2012-03-26 ENCOUNTER — Telehealth: Payer: Self-pay | Admitting: Internal Medicine

## 2012-03-26 ENCOUNTER — Other Ambulatory Visit: Payer: Self-pay | Admitting: Internal Medicine

## 2012-03-26 MED ORDER — ATENOLOL 50 MG PO TABS: ORAL_TABLET | ORAL | Status: DC

## 2012-03-26 NOTE — Telephone Encounter (Signed)
Atenolol 50 mg tablet Last refill:02/26/12 Take one and one-half tablets by mouth daily

## 2012-03-27 MED ORDER — DIAZEPAM 10 MG PO TABS: 10.0000 mg | ORAL_TABLET | Freq: Two times a day (BID) | ORAL | Status: DC | PRN

## 2012-03-27 NOTE — Telephone Encounter (Signed)
Refill: diazepam 10mg  tab #60. Take one tablet by mouth every 12 hours as needed for back spasm or pain. Qty 60. Last fill 01-08-12

## 2012-03-27 NOTE — Telephone Encounter (Signed)
Refill done.  

## 2012-03-27 NOTE — Addendum Note (Signed)
Addended by: Edwena Felty T on: 03/27/2012 05:38 PM   Modules accepted: Orders

## 2012-03-27 NOTE — Telephone Encounter (Signed)
Ok to refill 

## 2012-03-27 NOTE — Telephone Encounter (Signed)
Ok 60, 4 RF 

## 2012-03-30 ENCOUNTER — Encounter: Payer: Self-pay | Admitting: *Deleted

## 2012-04-01 ENCOUNTER — Telehealth: Payer: Self-pay | Admitting: Internal Medicine

## 2012-04-01 NOTE — Telephone Encounter (Signed)
Amlodipine dose increased from 5 mg to 10 mg last week. It will take a few more days for the new dose to kick in. Continue monitoring and call in 2 weeks with readings. If BP is consistently more than 150 please call sooner.

## 2012-04-01 NOTE — Telephone Encounter (Signed)
Discussed with pt's wife. 

## 2012-04-01 NOTE — Telephone Encounter (Signed)
Per call from patient's spouse, Dois Davenport, since patient started the new blood pressure medication, his readings have not quite been in the range Dr. Drue Novel wanted them to be.  On 03-26-12 was 121/68, on 03-27-12 was 116/61.  03-28-12 was 141/57, on 03-29-12 was 142/69, 03-30-12 was 147/68, and yesterday, 03-31-12 pressure was 143/68.  Please speak with patient or spouse.

## 2012-04-07 ENCOUNTER — Other Ambulatory Visit: Payer: Self-pay | Admitting: Cardiology

## 2012-04-07 DIAGNOSIS — R0989 Other specified symptoms and signs involving the circulatory and respiratory systems: Secondary | ICD-10-CM

## 2012-04-14 ENCOUNTER — Telehealth: Payer: Self-pay | Admitting: Internal Medicine

## 2012-04-14 MED ORDER — BENAZEPRIL HCL 20 MG PO TABS: 20.0000 mg | ORAL_TABLET | Freq: Two times a day (BID) | ORAL | Status: DC

## 2012-04-14 NOTE — Telephone Encounter (Signed)
Refill done.  

## 2012-04-14 NOTE — Telephone Encounter (Signed)
Refill: Benazepril hcl 20mg  tab #180. Take one tablet by mouth two times daily. Last fill 6.26.13

## 2012-04-15 ENCOUNTER — Encounter (INDEPENDENT_AMBULATORY_CARE_PROVIDER_SITE_OTHER): Payer: Medicare Other

## 2012-04-15 DIAGNOSIS — N182 Chronic kidney disease, stage 2 (mild): Secondary | ICD-10-CM

## 2012-04-15 DIAGNOSIS — R0989 Other specified symptoms and signs involving the circulatory and respiratory systems: Secondary | ICD-10-CM

## 2012-04-30 ENCOUNTER — Other Ambulatory Visit: Payer: Self-pay | Admitting: *Deleted

## 2012-04-30 ENCOUNTER — Other Ambulatory Visit: Payer: Self-pay | Admitting: Internal Medicine

## 2012-04-30 DIAGNOSIS — R0989 Other specified symptoms and signs involving the circulatory and respiratory systems: Secondary | ICD-10-CM

## 2012-04-30 MED ORDER — NIACIN ER (ANTIHYPERLIPIDEMIC) 1000 MG PO TBCR: 1000.0000 mg | EXTENDED_RELEASE_TABLET | Freq: Every day | ORAL | Status: DC

## 2012-04-30 NOTE — Telephone Encounter (Signed)
refill Niaspan 1000mg  terr #90-Take one tablet by mouth every night at bedtime -- last fill 07.16.13

## 2012-04-30 NOTE — Telephone Encounter (Signed)
Refill done.  

## 2012-05-15 DIAGNOSIS — I701 Atherosclerosis of renal artery: Secondary | ICD-10-CM

## 2012-05-15 HISTORY — DX: Atherosclerosis of renal artery: I70.1

## 2012-06-05 ENCOUNTER — Encounter: Payer: Self-pay | Admitting: Cardiovascular Disease

## 2012-06-05 ENCOUNTER — Ambulatory Visit (INDEPENDENT_AMBULATORY_CARE_PROVIDER_SITE_OTHER): Payer: Medicare Other | Admitting: Cardiovascular Disease

## 2012-06-05 VITALS — BP 162/70 | HR 54 | Ht 61.2 in | Wt 197.2 lb

## 2012-06-05 DIAGNOSIS — I701 Atherosclerosis of renal artery: Secondary | ICD-10-CM

## 2012-06-05 DIAGNOSIS — I2510 Atherosclerotic heart disease of native coronary artery without angina pectoris: Secondary | ICD-10-CM

## 2012-06-05 NOTE — Patient Instructions (Addendum)
Your physician wants you to follow-up in: 1 YEAR with Dr Cooper.  You will receive a reminder letter in the mail two months in advance. If you don't receive a letter, please call our office to schedule the follow-up appointment.  Your physician has requested that you have a renal artery duplex in 1 YEAR. During this test, an ultrasound is used to evaluate blood flow to the kidneys. Allow one hour for this exam. Do not eat after midnight the day before and avoid carbonated beverages. Take your medications as you usually do.  Your physician recommends that you continue on your current medications as directed. Please refer to the Current Medication list given to you today.  

## 2012-06-05 NOTE — Progress Notes (Signed)
HPI:  71 year old gentleman presenting for evaluation of coronary artery disease status post CABG and bilateral renal artery stenosis. The patient has a long history of cardiac disease dating back 30 years. He was first diagnosed with coronary artery disease around age 37. He tells me that medical therapy was advised because he was "too young for heart surgery." He was managed for about 10 years, but developed severe angina with minimal activity. He was taken urgently for four-vessel bypass surgery in 1992. He has had no anginal symptoms since that time. He was followed by Dr. Glennon Hamilton for many years but has not been seen since Joe's retirement.   The patient has mild chronic kidney disease and hypertension. He underwent a renal arterial duplex scan in October. This demonstrated severe bilateral renal artery stenosis with normal bilateral kidney size. The kidney length was 10.8 cm on the right and 11 cm on the left. Peripheral vascular evaluation was recommended.  From a symptomatic perspective he is doing well. He denies chest pain, chest pressure, dyspnea, orthopnea, or PND. He does have generalized fatigue. His home blood pressures range in the 130s over 60s. He denies orthopnea, PND, or leg swelling. He has some limitation from low back and hip pain. He is a former smoker but quit in the 1980s.  Outpatient Encounter Prescriptions as of 06/05/2012  Medication Sig Dispense Refill  . amLODipine (NORVASC) 10 MG tablet Take 1 tablet (10 mg total) by mouth daily.  90 tablet  3  . aspirin 81 MG tablet Take 81 mg by mouth daily.        Marland Kitchen atenolol (TENORMIN) 50 MG tablet Take 1 1/2 tablets by mouth once a day.  45 tablet  4  . atorvastatin (LIPITOR) 20 MG tablet Take 1 tablet (20 mg total) by mouth daily.  90 tablet  3  . benazepril (LOTENSIN) 20 MG tablet Take 1 tablet (20 mg total) by mouth 2 (two) times daily.  180 tablet  3  . diazepam (VALIUM) 10 MG tablet Take 1 tablet (10 mg total) by mouth  every 12 (twelve) hours as needed (spasm/pain). Take 1 tablet by mouth twice a day as needed back spasm/pain  60 tablet  4  . diclofenac (VOLTAREN) 50 MG EC tablet Take 1 tablet (50 mg total) by mouth 3 (three) times daily as needed.  90 tablet  3  . esomeprazole (NEXIUM) 40 MG capsule Take 1 capsule (40 mg total) by mouth daily as needed.  90 capsule  1  . fexofenadine (ALLEGRA) 180 MG tablet Take 180 mg by mouth daily.        . niacin (NIASPAN) 1000 MG CR tablet Take 1 tablet (1,000 mg total) by mouth at bedtime.  90 tablet  2  . [DISCONTINUED] simvastatin (ZOCOR) 40 MG tablet 1 tab daily        Hydrocodone-acetaminophen and Tramadol hcl  Past Medical History  Diagnosis Date  . HOH (hard of hearing)     has a hearing aid L, sees audiology rountinely  . Allergic rhinitis   . CAD (coronary artery disease)   . GERD (gastroesophageal reflux disease)   . Hyperlipemia   . Hypertension   . Hyperglycemia     A1C 5.8 04-2010  . Pain, joint, multiple sites     uses valium, occ uses for insomnia. uses for shoulder  pain  . Chronic renal insufficiency, stage II (mild)     Cr ~ 1.4, u/s 4-12 normal kidneys  Past Surgical History  Procedure Date  . Coronary artery bypass graft 1995    History   Social History  . Marital Status: Married    Spouse Name: N/A    Number of Children: 1  . Years of Education: N/A   Occupational History  . retired, delivery truck Hospital doctor    Social History Main Topics  . Smoking status: Former Smoker    Quit date: 03/13/1976  . Smokeless tobacco: Never Used  . Alcohol Use: No  . Drug Use: No  . Sexually Active: Not on file   Other Topics Concern  . Not on file   Social History Narrative   Born in [place], has a large extended family, oldest of several brothers-sister-------Diet- healthy------Exercise- limited by back pain     Family History  Problem Relation Age of Onset  . Hypertension Father   . Heart attack      2 brothers, CABG  .  Cancer Neg Hx     colon, prostate  . Diabetes Neg Hx     ROS:  General: no fevers/chills/night sweats Eyes: no blurry vision, diplopia, or amaurosis ENT: no sore throat or hearing loss Resp: no cough, wheezing, or hemoptysis CV: no edema or palpitations GI: no abdominal pain, nausea, vomiting, diarrhea. Positive for constipation  GU: no dysuria, frequency, or hematuria. Positive for hesitancy Skin: no rash Neuro: Positive for headache, negative for numbness, tingling, or weakness of extremities Musculoskeletal: no joint pain or swelling Heme: no bleeding, DVT, or easy bruising Endo: no polydipsia or polyuria  BP 162/70  Pulse 54  Ht 5' 1.2" (1.554 m)  Wt 89.449 kg (197 lb 3.2 oz)  BMI 37.02 kg/m2  PHYSICAL EXAM: Pt is alert and oriented, WD, WN, in no distress. HEENT: normal Neck: JVP normal. Carotid upstrokes normal without bruits. No thyromegaly. Lungs: equal expansion, clear bilaterally CV: Apex is discrete and nondisplaced, RRR without murmur or gallop Abd: soft, NT, +BS, positive periumbilical bruit, no hepatosplenomegaly Back: no CVA tenderness Ext: no C/C/E        Femoral pulses 2+= without bruits        DP/PT pulses intact and = Skin: warm and dry without rash Neuro: CNII-XII intact             Strength intact = bilaterally  EKG:  Sinus bradycardia with first-degree AV block, heart rate 54 beats per minute.  Renal arterial duplex: Normal caliber abdominal aorta with normal bilateral kidney size. There is greater than 60% bilateral renal artery stenosis with a peak velocity of 485 cm/s over the right renal artery and 473 cm/s over the left renal artery. Also noted is at least moderate stenosis of the celiac, SMA, and IMA.  ASSESSMENT AND PLAN: 1. Coronary atherosclerosis, native vessel, status post CABG. The patient is stable without anginal symptoms. He is on appropriate medical program with aspirin for antiplatelet therapy, atorvastatin for lipid lowering, and  the combination of a beta blocker and ACE inhibitor for hypertension. He should followup in 12 months. He understands that now over 20 years from his CABG, he is at higher risk of developing recurrent ischemic problems.   2. Hypertension. Blood pressure elevated in the office but his home readings are excellent. He will continue home monitoring and his same medications.  3. Bilateral renal artery stenosis. The patient's arterial duplex was reviewed. All of the clinical trial data points toward medical therapy over renal artery stenting at this point. If he had high risk features are progressive  renal dysfunction or signs of renal atrophy I certainly would more aggressively pursue renal PTA and stenting. I think he should be followed closely with monitoring of renal function every 6 months and a renal arterial duplex once yearly to evaluate kidney size and severity of renal stenosis. The patient is comfortable with the strategy of continued medical therapy.  earache 06/05/2012 12:43 PM

## 2012-06-30 ENCOUNTER — Encounter: Payer: Self-pay | Admitting: Lab

## 2012-07-01 ENCOUNTER — Encounter: Payer: Self-pay | Admitting: Internal Medicine

## 2012-07-01 ENCOUNTER — Ambulatory Visit (INDEPENDENT_AMBULATORY_CARE_PROVIDER_SITE_OTHER): Payer: Medicare Other | Admitting: Internal Medicine

## 2012-07-01 VITALS — BP 140/78 | HR 57 | Temp 98.0°F | Wt 195.0 lb

## 2012-07-01 DIAGNOSIS — I701 Atherosclerosis of renal artery: Secondary | ICD-10-CM

## 2012-07-01 DIAGNOSIS — M2550 Pain in unspecified joint: Secondary | ICD-10-CM

## 2012-07-01 DIAGNOSIS — E785 Hyperlipidemia, unspecified: Secondary | ICD-10-CM

## 2012-07-01 DIAGNOSIS — I10 Essential (primary) hypertension: Secondary | ICD-10-CM

## 2012-07-01 LAB — LIPID PANEL: Cholesterol: 108 mg/dL (ref 0–200)

## 2012-07-01 LAB — ALT: ALT: 18 U/L (ref 0–53)

## 2012-07-01 LAB — TSH: TSH: 1.73 u[IU]/mL (ref 0.35–5.50)

## 2012-07-01 LAB — AST: AST: 18 U/L (ref 0–37)

## 2012-07-01 NOTE — Assessment & Plan Note (Signed)
Recently switch from simvastatin to Lipitor, check lab

## 2012-07-01 NOTE — Assessment & Plan Note (Addendum)
I'm somehow concerned about use of diclofenac in the setting of chronic renal insufficiency however patient assures me he takes diclofenac only 2 or 3 times a year.

## 2012-07-01 NOTE — Assessment & Plan Note (Addendum)
Excellent ambulatory BPs. He has developed mild constipation and ankle edema at the end of the day probably related to see CCBs. He is responding   well to amlodipine so I'm hesitant to discontinue it, we agree to continue with amlodipine 10 mg, cont OTC stool softeners PRN and compression stockings. If side effects are worse, we'll have to consider change medication.

## 2012-07-01 NOTE — Progress Notes (Signed)
  Subjective:    Patient ID: Dustin Ashley, male    DOB: 27-Mar-1941, 71 y.o.   MRN: [id number]  HPI Routine office visit Hypertension, the last time he was here, we increase amlodipine, BP today in the office 140/78, at home BPs are consistently 130/60. He apparently is having some side effects. See review of systems. Renal artery stenosis diagnosed in 05-2012 via ultrasound, see assessment and plan. High cholesterol, we changed from simvastatin to Lipitor, good  compliance, no apparent side effects.  Past Medical History  Diagnosis Date  . HOH (hard of hearing)     has a hearing aid L, sees audiology rountinely  . Allergic rhinitis   . CAD (coronary artery disease)   . GERD (gastroesophageal reflux disease)   . Hyperlipemia   . Hypertension   . Hyperglycemia     A1C 5.8 04-2010  . Pain, joint, multiple sites     uses valium, occ uses for insomnia. uses for shoulder  pain  . Chronic renal insufficiency, stage II (mild)     Cr ~ 1.4, u/s 4-12 normal kidneys  . RAS (renal artery stenosis) 05-2012    Past Surgical History  Procedure Date  . 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 He also has noticed on and off ankle swelling bilaterally, worse at the end of the day. Denies pain in the area. Denies chest pain, shortness of breath. No postprandial abdominal pain, nausea, vomiting or diarrhea. I notice he takes diclofenac for pain, he takes it 2 or 3 times a year.      Objective:   Physical Exam General -- alert, well-developed Lungs -- normal respiratory effort, no intercostal retractions, no accessory muscle use, and normal breath sounds.   Heart-- normal rate, regular rhythm, no murmur, and no gallop.   Extremities--  trace peri-ankle edema bilaterally Psych-- Cognition and judgment appear intact. Alert and cooperative with normal attention span and concentration.  not anxious appearing and not depressed appearing.      Assessment & Plan:

## 2012-07-01 NOTE — Assessment & Plan Note (Addendum)
Diagnosed via ultrasound 05/2012, also noted moderate compromise of the GI circulation. Saw cardiology,note reviewed, they recommended conservative treatment, repeat ultrasound 05-2012, check kidney function every 6 months. Today he denies symptoms consistent with a GI claudication

## 2012-07-03 ENCOUNTER — Encounter: Payer: Self-pay | Admitting: *Deleted

## 2012-08-24 ENCOUNTER — Telehealth: Payer: Self-pay | Admitting: Internal Medicine

## 2012-08-24 MED ORDER — ATENOLOL 50 MG PO TABS: ORAL_TABLET | ORAL | Status: DC

## 2012-08-24 NOTE — Telephone Encounter (Signed)
Refill done.  

## 2012-08-24 NOTE — Telephone Encounter (Signed)
refill Atenolol (Tab) 50 MG Take 1-1/2 tablets by mouth once a day. #45 last fill 1.13.14

## 2012-10-20 ENCOUNTER — Encounter: Payer: Self-pay | Admitting: Internal Medicine

## 2012-10-27 ENCOUNTER — Ambulatory Visit: Payer: Medicare Other | Admitting: Internal Medicine

## 2012-11-11 ENCOUNTER — Ambulatory Visit (INDEPENDENT_AMBULATORY_CARE_PROVIDER_SITE_OTHER): Payer: Medicare Other | Admitting: Internal Medicine

## 2012-11-11 ENCOUNTER — Encounter: Payer: Self-pay | Admitting: Internal Medicine

## 2012-11-11 VITALS — BP 130/72 | HR 54 | Wt 197.0 lb

## 2012-11-11 DIAGNOSIS — I10 Essential (primary) hypertension: Secondary | ICD-10-CM

## 2012-11-11 DIAGNOSIS — N182 Chronic kidney disease, stage 2 (mild): Secondary | ICD-10-CM

## 2012-11-11 DIAGNOSIS — R5381 Other malaise: Secondary | ICD-10-CM

## 2012-11-11 DIAGNOSIS — R5383 Other fatigue: Secondary | ICD-10-CM

## 2012-11-11 LAB — BASIC METABOLIC PANEL
BUN: 23 mg/dL (ref 6–23)
CO2: 30 mEq/L (ref 19–32)
Chloride: 105 mEq/L (ref 96–112)
Creatinine, Ser: 1.3 mg/dL (ref 0.4–1.5)

## 2012-11-11 LAB — CBC WITH DIFFERENTIAL/PLATELET
Basophils Relative: 0.8 % (ref 0.0–3.0)
Eosinophils Absolute: 0.4 10*3/uL (ref 0.0–0.7)
Lymphs Abs: 2.4 10*3/uL (ref 0.7–4.0)
MCHC: 34.2 g/dL (ref 30.0–36.0)
MCV: 86.6 fl (ref 78.0–100.0)
Monocytes Absolute: 0.7 10*3/uL (ref 0.1–1.0)
Neutrophils Relative %: 64.5 % (ref 43.0–77.0)
Platelets: 157 10*3/uL (ref 150.0–400.0)

## 2012-11-11 NOTE — Patient Instructions (Addendum)
Next visit in 5 months , physical exam

## 2012-11-11 NOTE — Assessment & Plan Note (Addendum)
Some fatigue, see HPI, doubt CHF or depression. Pain in the hips w/ walking could be DJD, vascular or neuro insuf (normal femoral pedal pulses recently when he saw cards) rec observation, consider r/o iliac vascular dz

## 2012-11-11 NOTE — Assessment & Plan Note (Signed)
Well controlled, still has sporadic edema, usually if stays on his feet x long or if drives. Also some constipation, now on miralax prn and doing ok  rec cont w/ CCBs, use compression stockings!

## 2012-11-11 NOTE — Assessment & Plan Note (Signed)
Labs

## 2012-11-11 NOTE — Progress Notes (Signed)
  Subjective:    Patient ID: Dustin Ashley, male    DOB: 06-Oct-1940, 72 y.o.   MRN: [id number]  HPI Routine office visit Hypertension, good medication compliance, ambulatory BPs in the 120s. Still has occasional ankle swelling mostly if he stands up for long or takes a 2 or 3 hour car trip. Has not tried compression stockings. Still occasional constipation, self discontinue stool softeners because diarrhea, now uses MiraLax once or twice a week.  Past Medical History  Diagnosis Date  . HOH (hard of hearing)     has a hearing aid L, sees audiology rountinely  . Allergic rhinitis   . CAD (coronary artery disease)   . GERD (gastroesophageal reflux disease)   . Hyperlipemia   . Hypertension   . Hyperglycemia     A1C 5.8 04-2010  . Pain, joint, multiple sites     uses valium, occ uses for insomnia. uses for shoulder  pain  . Chronic renal insufficiency, stage II (mild)     Cr ~ 1.4, u/s 4-12 normal kidneys  . RAS (renal artery stenosis) 05-2012    Past Surgical History  Procedure Laterality Date  . Coronary artery bypass graft  1995     Review of Systems Complain of fatigue from time to time, For instance sometimes simply just don't like to do the yard. Denies chest pain, shortness or breath, depression, snoring. Occasionally takes a nap but denies feeling sleepy. Still takes walks, after a while has to stop from pain in the hips. No chest pain, shortness of breath, dyspnea on exertion, nausea, vomiting, diarrhea    Objective:   Physical Exam  General -- alert, well-developed, No apparent distress  Lungs -- normal respiratory effort, no intercostal retractions, no accessory muscle use, and normal breath sounds.   Heart-- normal rate, regular rhythm, no murmur, and no gallop.   Extremities-- no pretibial edema bilaterally Psych-- Cognition and judgment appear intact. Alert and cooperative with normal attention span and concentration.  not anxious appearing and not depressed  appearing.       Assessment & Plan:

## 2012-11-12 ENCOUNTER — Encounter: Payer: Self-pay | Admitting: *Deleted

## 2012-11-24 ENCOUNTER — Telehealth: Payer: Self-pay | Admitting: General Practice

## 2012-11-24 MED ORDER — DIAZEPAM 10 MG PO TABS: 10.0000 mg | ORAL_TABLET | Freq: Two times a day (BID) | ORAL | Status: DC | PRN

## 2012-11-24 NOTE — Telephone Encounter (Signed)
Refill done. Toxicology screen (-) 12/ 200 13.

## 2012-11-24 NOTE — Telephone Encounter (Signed)
Diazepam Refill request. Pt last seen on 11/11/12 and med last filled on 03/27/12 #60 with 4 refills. Ok to fill?   Please fax to [facility] [phone]

## 2013-02-09 ENCOUNTER — Ambulatory Visit (INDEPENDENT_AMBULATORY_CARE_PROVIDER_SITE_OTHER): Payer: Medicare Other | Admitting: Internal Medicine

## 2013-02-09 ENCOUNTER — Encounter: Payer: Self-pay | Admitting: Internal Medicine

## 2013-02-09 VITALS — BP 140/70 | HR 54 | Temp 97.8°F | Wt 198.0 lb

## 2013-02-09 DIAGNOSIS — M549 Dorsalgia, unspecified: Secondary | ICD-10-CM

## 2013-02-09 LAB — POCT URINALYSIS DIPSTICK
Protein, UA: NEGATIVE
Spec Grav, UA: <=1.005
Urobilinogen, UA: 0.2
pH, UA: 6.5

## 2013-02-09 NOTE — Assessment & Plan Note (Signed)
Acute back pain for one day, no radicular symptoms, udip negative. Plan: Conservative treatment, see Instructions.

## 2013-02-09 NOTE — Progress Notes (Signed)
  Subjective:    Patient ID: Dustin Ashley, male    DOB: 08-25-1940, 71 y.o.   MRN: [id number]  HPI Acute visit Symptoms started yesterday morning with a steady pain at the lower left back without radiation although one time he felt some discomfort going to the abdomen. Pain was increased by bending or twisting his torso Today the pain was less intense that yesterday but not gone, he took Tylenol and now is essentially pain-free. He wonders about a UTI.  Past Medical History  Diagnosis Date  . HOH (hard of hearing)     has a hearing aid L, sees audiology rountinely  . Allergic rhinitis   . CAD (coronary artery disease)   . GERD (gastroesophageal reflux disease)   . Hyperlipemia   . Hypertension   . Hyperglycemia     A1C 5.8 04-2010  . Pain, joint, multiple sites     uses valium, occ uses for insomnia. uses for shoulder  pain  . Chronic renal insufficiency, stage II (mild)     Cr ~ 1.4, u/s 4-12 normal kidneys  . RAS (renal artery stenosis) 05-2012    Past Surgical History  Procedure Laterality Date  . Coronary artery bypass graft  1995    Review of Systems Denies any fever or chills No dysuria, gross hematuria. No abdominal pain. Denies any recent history of heavy lifting or injury. No rash.     Objective:   Physical Exam BP 140/70  Pulse 54  Temp(Src) 97.8 F (36.6 C) (Oral)  Wt 198 lb (89.812 kg)  BMI 37.19 kg/m2  SpO2 95%  General -- alert, well-developed, NAD Abdomen--soft, non-tender, no distention, no masses, no rash, no CVA tenderness   Extremities-- no pretibial edema bilaterally Back-- Not tender to palpation at the lumbar spine or sacroiliac areas  Neurologic-- alert & oriented X3; DTRs and strength normal in all extremities. Gait and posture not antalgic, straight leg test negative Psych-- Cognition and judgment appear intact. Alert and cooperative with normal attention span and concentration.  not anxious appearing and not depressed appearing.       Assessment & Plan:

## 2013-02-09 NOTE — Patient Instructions (Addendum)
Rest, no heavy lifting. Heating pad twice a day Tylenol  500 mg OTC 2 tabs a day every 8 hours as needed for pain Valium at night (muscle relaxant) Take diclofenac only sporadically if the pain is severe. Call in 10-14 days if you're not improving.

## 2013-03-11 ENCOUNTER — Other Ambulatory Visit: Payer: Self-pay | Admitting: *Deleted

## 2013-03-11 MED ORDER — ATORVASTATIN CALCIUM 20 MG PO TABS: 20.0000 mg | ORAL_TABLET | Freq: Every day | ORAL | Status: DC

## 2013-03-11 NOTE — Telephone Encounter (Signed)
Rx refilled for Atorvastatin 20 mg.  Ag cma

## 2013-03-23 ENCOUNTER — Other Ambulatory Visit: Payer: Self-pay | Admitting: *Deleted

## 2013-03-23 MED ORDER — AMLODIPINE BESYLATE 10 MG PO TABS: 10.0000 mg | ORAL_TABLET | Freq: Every day | ORAL | Status: DC

## 2013-03-23 MED ORDER — ATENOLOL 50 MG PO TABS: ORAL_TABLET | ORAL | Status: DC

## 2013-03-23 NOTE — Telephone Encounter (Signed)
Rx was refilled or amlodipine.  Ag cma

## 2013-03-23 NOTE — Telephone Encounter (Signed)
Rx was refilled for atenolol 50 mg.  Ag cma

## 2013-03-31 ENCOUNTER — Encounter: Payer: Medicare Other | Admitting: Internal Medicine

## 2013-04-07 ENCOUNTER — Telehealth: Payer: Self-pay

## 2013-04-07 NOTE — Telephone Encounter (Signed)
HM-UTD with exception flu vaccine and possible shingles vaccine Med reconciled, pharmacy and allergies verified.  Last PSA 03/14/2011

## 2013-04-08 ENCOUNTER — Encounter: Payer: Self-pay | Admitting: Internal Medicine

## 2013-04-08 ENCOUNTER — Ambulatory Visit (INDEPENDENT_AMBULATORY_CARE_PROVIDER_SITE_OTHER): Payer: Medicare Other | Admitting: Internal Medicine

## 2013-04-08 VITALS — BP 135/73 | HR 57 | Temp 98.4°F | Ht 68.9 in | Wt 199.4 lb

## 2013-04-08 DIAGNOSIS — Z0000 Encounter for general adult medical examination without abnormal findings: Secondary | ICD-10-CM

## 2013-04-08 DIAGNOSIS — E785 Hyperlipidemia, unspecified: Secondary | ICD-10-CM

## 2013-04-08 DIAGNOSIS — K219 Gastro-esophageal reflux disease without esophagitis: Secondary | ICD-10-CM

## 2013-04-08 DIAGNOSIS — I10 Essential (primary) hypertension: Secondary | ICD-10-CM

## 2013-04-08 DIAGNOSIS — R7989 Other specified abnormal findings of blood chemistry: Secondary | ICD-10-CM

## 2013-04-08 DIAGNOSIS — N182 Chronic kidney disease, stage 2 (mild): Secondary | ICD-10-CM

## 2013-04-08 DIAGNOSIS — Z125 Encounter for screening for malignant neoplasm of prostate: Secondary | ICD-10-CM

## 2013-04-08 DIAGNOSIS — M549 Dorsalgia, unspecified: Secondary | ICD-10-CM

## 2013-04-08 LAB — COMPREHENSIVE METABOLIC PANEL
Alkaline Phosphatase: 109 U/L (ref 39–117)
BUN: 24 mg/dL — ABNORMAL HIGH (ref 6–23)
Creatinine, Ser: 1.2 mg/dL (ref 0.4–1.5)
GFR: 63.18 mL/min (ref >60.00)
Glucose, Bld: 104 mg/dL — ABNORMAL HIGH (ref 70–99)
Sodium: 141 mEq/L (ref 135–145)
Total Bilirubin: 0.8 mg/dL (ref 0.3–1.2)
Total Protein: 6.9 g/dL (ref 6.0–8.3)

## 2013-04-08 LAB — LIPID PANEL
Cholesterol: 116 mg/dL (ref 0–200)
HDL: 31.5 mg/dL — ABNORMAL LOW (ref >39.00)
LDL Cholesterol: 54 mg/dL (ref 0–99)
Triglycerides: 154 mg/dL — ABNORMAL HIGH (ref 0.0–149.0)
VLDL: 30.8 mg/dL (ref 0.0–40.0)

## 2013-04-08 MED ORDER — ZOSTER VACCINE LIVE 19400 UNT/0.65ML ~~LOC~~ SOLR: 0.6500 mL | Freq: Once | SUBCUTANEOUS | Status: DC

## 2013-04-08 NOTE — Assessment & Plan Note (Signed)
Labs

## 2013-04-08 NOTE — Patient Instructions (Addendum)
Get your blood work before you leave  Next visit in  6 months   for a check up  Get a high dose flu shot when available    Fall Prevention and Home Safety Falls cause injuries and can affect all age groups. It is possible to use preventive measures to significantly decrease the likelihood of falls. There are many simple measures which can make your home safer and prevent falls. OUTDOORS  Repair cracks and edges of walkways and driveways.  Remove high doorway thresholds.  Trim shrubbery on the main path into your home.  Have good outside lighting.  Clear walkways of tools, rocks, debris, and clutter.  Check that handrails are not broken and are securely fastened. Both sides of steps should have handrails.  Have leaves, snow, and ice cleared regularly.  Use sand or salt on walkways during winter months.  In the garage, clean up grease or oil spills. BATHROOM  Install night lights.  Install grab bars by the toilet and in the tub and shower.  Use non-skid mats or decals in the tub or shower.  Place a plastic non-slip stool in the shower to sit on, if needed.  Keep floors dry and clean up all water on the floor immediately.  Remove soap buildup in the tub or shower on a regular basis.  Secure bath mats with non-slip, double-sided rug tape.  Remove throw rugs and tripping hazards from the floors. BEDROOMS  Install night lights.  Make sure a bedside light is easy to reach.  Do not use oversized bedding.  Keep a telephone by your bedside.  Have a firm chair with side arms to use for getting dressed.  Remove throw rugs and tripping hazards from the floor. KITCHEN  Keep handles on pots and pans turned toward the center of the stove. Use back burners when possible.  Clean up spills quickly and allow time for drying.  Avoid walking on wet floors.  Avoid hot utensils and knives.  Position shelves so they are not too high or low.  Place commonly used objects  within easy reach.  If necessary, use a sturdy step stool with a grab bar when reaching.  Keep electrical cables out of the way.  Do not use floor polish or wax that makes floors slippery. If you must use wax, use non-skid floor wax.  Remove throw rugs and tripping hazards from the floor. STAIRWAYS  Never leave objects on stairs.  Place handrails on both sides of stairways and use them. Fix any loose handrails. Make sure handrails on both sides of the stairways are as long as the stairs.  Check carpeting to make sure it is firmly attached along stairs. Make repairs to worn or loose carpet promptly.  Avoid placing throw rugs at the top or bottom of stairways, or properly secure the rug with carpet tape to prevent slippage. Get rid of throw rugs, if possible.  Have an electrician put in a light switch at the top and bottom of the stairs. OTHER FALL PREVENTION TIPS  Wear low-heel or rubber-soled shoes that are supportive and fit well. Wear closed toe shoes.  When using a stepladder, make sure it is fully opened and both spreaders are firmly locked. Do not climb a closed stepladder.  Add color or contrast paint or tape to grab bars and handrails in your home. Place contrasting color strips on first and last steps.  Learn and use mobility aids as needed. Install an electrical emergency response system.  Turn on lights to avoid dark areas. Replace light bulbs that burn out immediately. Get light switches that glow.  Arrange furniture to create clear pathways. Keep furniture in the same place.  Firmly attach carpet with non-skid or double-sided tape.  Eliminate uneven floor surfaces.  Select a carpet pattern that does not visually hide the edge of steps.  Be aware of all pets. OTHER HOME SAFETY TIPS  Set the water temperature for 120 F (48.8 C).  Keep emergency numbers on or near the telephone.  Keep smoke detectors on every level of the home and near sleeping  areas. Document Released: 06/21/2002 Document Revised: 12/31/2011 Document Reviewed: 09/20/2011 [facility] Patient Information 2014 [place], [place].

## 2013-04-08 NOTE — Assessment & Plan Note (Addendum)
Well-controlled, mild edema,did not like compression stockings. Plan: No change,Monitor edema

## 2013-04-08 NOTE — Assessment & Plan Note (Addendum)
Back-hip pain relatively well control, still unable to take long walks. Will continue with Tylenol, Valium, diclofenac, knows to take diclofenac very sparingly due to renal insufficiency. Negative UDS 06/2012, UDS today

## 2013-04-08 NOTE — Assessment & Plan Note (Signed)
Monitoring: Check a BMP

## 2013-04-08 NOTE — Progress Notes (Signed)
Subjective:    Patient ID: Dustin Ashley, male    DOB: [birth year], 72 y.o.   MRN: [id number]  HPI Here for Medicare AWV:  1. Risk factors based on Past M, S, F history: reviewed  2. Physical Activities: active, limited yard work  and home chores; unable to take long walks d/t hip pain  3. Depression/mood: Neg screening 4. Hearing: has a hearing aid 5. ADL's: Independent  6. Fall Risk: prevention discussed , no recent falls 7. home Safety: does feel safe at home  8. Height, weight, & visual acuity: see VS, vision ok w/ glasses, sees eye doctor  9. Counseling: provided  10. Labs ordered based on risk factors: if needed  11. Referral Coordination: if needed  12. Care Plan, see assessment and plan  13. Cognitive Assessment: motor skills and cognition appropriate    In addition, today we discussed the following: High cholesterol, good compliance with medication, he is also taking fish oil. Pain management, well-controlled with Tylenol on sporadic use of diclofenac and Valium. Hypertension--Ambulatory BPs 130/60, feeling well, and good compliance with medications.  Past Medical History  Diagnosis Date  . HOH (hard of hearing)     has a hearing aid L, sees audiology rountinely  . Allergic rhinitis   . CAD (coronary artery disease)   . GERD (gastroesophageal reflux disease)   . Hyperlipemia   . Hypertension   . Hyperglycemia     A1C 5.8 04-2010  . Pain, joint, multiple sites     uses valium, occ uses for insomnia. uses for shoulder  pain  . Chronic renal insufficiency, stage II (mild)     Cr ~ 1.4, u/s 4-12 normal kidneys  . RAS (renal artery stenosis) 05-2012    Past Surgical History  Procedure Laterality Date  . Coronary artery bypass graft  1995   History   Social History  . Marital Status: Married    Spouse Name: N/A    Number of Children: 1  . Years of Education: N/A   Occupational History  . retired, delivery truck Hospital doctor    Social History Main Topics  .  Smoking status: Former Smoker    Quit date: 03/13/1976  . Smokeless tobacco: Never Used  . Alcohol Use: No  . Drug Use: No  . Sexual Activity: Not on file   Other Topics Concern  . Not on file   Social History Narrative   Born in [place], has a large extended family, oldest of several brothers-sister         Family History: colon cancer--no prostate cancer--no hypertension -- F MI-- 2 brothers CABG CHF--F   DM-- no     Review of Systems Diet-- healthy most of the time No  CP, SOB, occ ankle edema at end of the day No nausea, vomiting diarrhea No blood in the stools No GERD  Sx frequently, on PPIs as needed  No dysphagia or odynophagia No cough, chest congestion  No dysuria, gross hematuria, difficulty urinating        Objective:   Physical Exam BP 135/73  Pulse 57  Temp(Src) 98.4 F (36.9 C)  Ht 5' 8.9" (1.75 m)  Wt 199 lb 6.4 oz (90.447 kg)  BMI 29.53 kg/m2  SpO2 95% General -- alert, well-developed, NAD.  Neck --no thyromegaly Lungs -- normal respiratory effort, no intercostal retractions, no accessory muscle use, and normal breath sounds.  Heart-- normal rate, regular rhythm, no murmur.  Abdomen-- Not distended, good bowel sounds,soft, non-tender. + bruit  better heard R from the umbilicus Rectal-- No external abnormalities noted. Normal sphincter tone. No rectal masses or tenderness. Brown stool, Hemoccult negative  Prostate--Prostate gland firm and smooth, no enlargement, nodularity, tenderness, mass, asymmetry or induration. Extremities-- trace peri ankle edema bilaterally  Neurologic--  alert & oriented X3.  Speech normal, gait normal, strength normal in all extremities.   Psych-- Cognition and judgment appear intact. Cooperative with normal attention span and concentration. No anxious appearing , no depressed appearing.      Assessment & Plan:

## 2013-04-08 NOTE — Assessment & Plan Note (Signed)
Well-controlled with occasional use of PPIs

## 2013-04-08 NOTE — Assessment & Plan Note (Addendum)
See [facility] records Immunizations: Needs high dose  flu vaccine  shingles vaccine -- never had, benefits discussed, new Rx provided  Next Cscope 2015 DRE-- wnl, check a PSA

## 2013-04-08 NOTE — Assessment & Plan Note (Signed)
Will check labs

## 2013-04-09 ENCOUNTER — Other Ambulatory Visit: Payer: Self-pay | Admitting: General Practice

## 2013-04-09 MED ORDER — BENAZEPRIL HCL 20 MG PO TABS: 20.0000 mg | ORAL_TABLET | Freq: Two times a day (BID) | ORAL | Status: DC

## 2013-04-10 ENCOUNTER — Encounter: Payer: Self-pay | Admitting: Internal Medicine

## 2013-04-12 ENCOUNTER — Encounter: Payer: Self-pay | Admitting: *Deleted

## 2013-04-12 ENCOUNTER — Telehealth: Payer: Self-pay | Admitting: *Deleted

## 2013-04-12 NOTE — Telephone Encounter (Signed)
Letter sent. DJR  

## 2013-04-12 NOTE — Telephone Encounter (Signed)
Message copied by Eustace Quail on Mon Apr 12, 2013  1:30 PM ------      Message from: Wanda Plump      Created: Sat Apr 10, 2013  2:44 PM       S,end a letter      Adela Glimpse      Your kidney , liver and prostate tests are all normal.      Your cholesterol continued to be well-controlled.      The blood sugar test, hemoglobin A1c, indicates your blood sugar has increased a little in the last year.      Continue taking the same medicines, watch your diet and stay as active as possible! ------

## 2013-05-25 ENCOUNTER — Encounter: Payer: Self-pay | Admitting: Internal Medicine

## 2013-06-08 ENCOUNTER — Other Ambulatory Visit: Payer: Self-pay | Admitting: *Deleted

## 2013-06-08 MED ORDER — ESOMEPRAZOLE MAGNESIUM 40 MG PO CPDR: 40.0000 mg | DELAYED_RELEASE_CAPSULE | Freq: Every day | ORAL | Status: DC | PRN

## 2013-06-08 NOTE — Telephone Encounter (Signed)
rx refilled per protocol. DJR  

## 2013-06-14 ENCOUNTER — Encounter (HOSPITAL_COMMUNITY): Payer: Medicare Other

## 2013-06-18 ENCOUNTER — Ambulatory Visit: Payer: Medicare Other | Admitting: Cardiovascular Disease

## 2013-06-18 ENCOUNTER — Other Ambulatory Visit: Payer: Self-pay | Admitting: *Deleted

## 2013-06-18 MED ORDER — ATORVASTATIN CALCIUM 20 MG PO TABS: 20.0000 mg | ORAL_TABLET | Freq: Every day | ORAL | Status: DC

## 2013-06-18 NOTE — Telephone Encounter (Signed)
Atorvastatin refilled per protocol 

## 2013-06-28 ENCOUNTER — Encounter: Payer: Self-pay | Admitting: Cardiovascular Disease

## 2013-06-28 ENCOUNTER — Ambulatory Visit (HOSPITAL_COMMUNITY): Payer: Medicare Other | Attending: Cardiovascular Disease

## 2013-06-28 DIAGNOSIS — K551 Chronic vascular disorders of intestine: Secondary | ICD-10-CM | POA: Insufficient documentation

## 2013-06-28 DIAGNOSIS — E785 Hyperlipidemia, unspecified: Secondary | ICD-10-CM | POA: Insufficient documentation

## 2013-06-28 DIAGNOSIS — I700 Atherosclerosis of aorta: Secondary | ICD-10-CM | POA: Insufficient documentation

## 2013-06-28 DIAGNOSIS — Z87891 Personal history of nicotine dependence: Secondary | ICD-10-CM | POA: Insufficient documentation

## 2013-06-28 DIAGNOSIS — I701 Atherosclerosis of renal artery: Secondary | ICD-10-CM

## 2013-06-28 DIAGNOSIS — I2510 Atherosclerotic heart disease of native coronary artery without angina pectoris: Secondary | ICD-10-CM | POA: Insufficient documentation

## 2013-06-28 DIAGNOSIS — I10 Essential (primary) hypertension: Secondary | ICD-10-CM | POA: Insufficient documentation

## 2013-06-28 DIAGNOSIS — R0989 Other specified symptoms and signs involving the circulatory and respiratory systems: Secondary | ICD-10-CM | POA: Insufficient documentation

## 2013-06-28 DIAGNOSIS — N289 Disorder of kidney and ureter, unspecified: Secondary | ICD-10-CM | POA: Insufficient documentation

## 2013-07-06 ENCOUNTER — Encounter: Payer: Self-pay | Admitting: Cardiovascular Disease

## 2013-07-06 ENCOUNTER — Ambulatory Visit (INDEPENDENT_AMBULATORY_CARE_PROVIDER_SITE_OTHER): Payer: Medicare Other | Admitting: Cardiovascular Disease

## 2013-07-06 VITALS — BP 136/72 | HR 53 | Ht 70.0 in | Wt 201.0 lb

## 2013-07-06 DIAGNOSIS — I701 Atherosclerosis of renal artery: Secondary | ICD-10-CM

## 2013-07-06 DIAGNOSIS — N182 Chronic kidney disease, stage 2 (mild): Secondary | ICD-10-CM

## 2013-07-06 DIAGNOSIS — E785 Hyperlipidemia, unspecified: Secondary | ICD-10-CM

## 2013-07-06 DIAGNOSIS — I2510 Atherosclerotic heart disease of native coronary artery without angina pectoris: Secondary | ICD-10-CM

## 2013-07-06 NOTE — Patient Instructions (Signed)
Your physician has requested that you have a repeat renal artery duplex in 1 year.  Your physician wants you to follow-up in: 1 year with Dr. Excell Seltzer 1 WEEK after your test. You will receive a reminder letter in the mail two months in advance. If you don't receive a letter, please call our office to schedule the follow-up appointment.  Your physician recommends that you continue on your current medications as directed. Please refer to the Current Medication list given to you today.

## 2013-07-06 NOTE — Progress Notes (Signed)
HPI:  72 year old gentleman presenting for evaluation of coronary artery disease status post CABG and bilateral renal artery stenosis. The patient has a long history of cardiac disease dating back 30 years. He was first diagnosed with coronary artery disease around age 80. He tells me that medical therapy was advised because he was "too young for heart surgery." He was managed for about 10 years, but developed severe angina with minimal activity. He was taken urgently for four-vessel bypass surgery in 1992. He has had no anginal symptoms since that time.  The patient has been diagnosed with severe bilateral renal artery stenosis. After review of the pros and cons of renal artery revascularization versus medical therapy, we elected to treat him medically last year. He had a followup study this year demonstrating stable but severe renal artery stenosis bilaterally with stable kidney size. He is kidney function is normal with a creatinine of 1.2 and GFR of 63. Renal artery duplex 06/28/2013 showed stable kidney size at 10.7 and 10.6 cm on the right and left, respectively. Proximal velocities were 470 and 460 cm/s and renal:aortic ratios were 3.4 and 3.3 on the right and left sides.   He is doing fine without complaints today. Reports good BP control. No chest pain, dyspnea, or edema. No urinary symptoms.    Outpatient Encounter Prescriptions as of 07/06/2013  Medication Sig  . amLODipine (NORVASC) 10 MG tablet Take 1 tablet (10 mg total) by mouth daily.  Marland Kitchen aspirin 81 MG tablet Take 81 mg by mouth daily.    Marland Kitchen atenolol (TENORMIN) 50 MG tablet Take 1 1/2 tablets by mouth once a day.  Marland Kitchen atorvastatin (LIPITOR) 20 MG tablet Take 1 tablet (20 mg total) by mouth daily.  . benazepril (LOTENSIN) 20 MG tablet Take 1 tablet (20 mg total) by mouth 2 (two) times daily.  . diazepam (VALIUM) 10 MG tablet Take 1 tablet (10 mg total) by mouth every 12 (twelve) hours as needed (spasm/pain). Take 1 tablet by mouth  twice a day as needed back spasm/pain  . diclofenac (VOLTAREN) 50 MG EC tablet Take 1 tablet (50 mg total) by mouth 3 (three) times daily as needed.  Marland Kitchen esomeprazole (NEXIUM) 40 MG capsule Take 1 capsule (40 mg total) by mouth daily as needed.  . fexofenadine (ALLEGRA) 180 MG tablet Take 180 mg by mouth daily.    . niacin (NIASPAN) 1000 MG CR tablet Take 1 tablet (1,000 mg total) by mouth at bedtime.  . [DISCONTINUED] zoster vaccine live, PF, (ZOSTAVAX) 96045 UNT/0.65ML injection Inject 19,400 Units into the skin once.    Allergies  Allergen Reactions  . Hydrocodone-Acetaminophen     REACTION: bladder obstruction  . Tramadol Hcl     REACTION: bladder obstruction    Past Medical History  Diagnosis Date  . HOH (hard of hearing)     has a hearing aid L, sees audiology rountinely  . Allergic rhinitis   . CAD (coronary artery disease)   . GERD (gastroesophageal reflux disease)   . Hyperlipemia   . Hypertension   . Hyperglycemia     A1C 5.8 04-2010  . Pain, joint, multiple sites     uses valium, occ uses for insomnia. uses for shoulder  pain  . Chronic renal insufficiency, stage II (mild)     Cr ~ 1.4, u/s 4-12 normal kidneys  . RAS (renal artery stenosis) 05-2012     ROS: Negative except as per HPI  BP 136/72  Pulse 53  Ht 5'  10" (1.778 m)  Wt 201 lb (91.173 kg)  BMI 28.84 kg/m2  PHYSICAL EXAM: Pt is alert and oriented, NAD HEENT: normal Neck: JVP - normal, carotids 2+= without bruits Lungs: CTA bilaterally CV: RRR without murmur or gallop Abd: soft, NT, Positive BS, no hepatomegaly Ext: no C/C/E, distal pulses intact and equal Skin: warm/dry no rash  EKG:  Sinus brady 53 bpm, 1st degree AV Block  ASSESSMENT AND PLAN: 1. CAD s/p CABG: stable without angina. Medical program reviewed and appropriate.  2. Renal atherosclerosis. BP controlled, renal function normal, and renal size within normal limits. Continue annual follow-up and duplex imaging.  3. HTN - controlled  on med Rx.   4. Hyperlipidemia: on combination of niacin and atorvastatin. Lipids reviewed from 9/25 and at goal (LDL 54)  For follow-up I'll see him back in 12 months.  Tonny Bollman 07/08/2013 10:54 PM

## 2013-07-12 ENCOUNTER — Other Ambulatory Visit: Payer: Self-pay | Admitting: *Deleted

## 2013-07-12 MED ORDER — BENAZEPRIL HCL 20 MG PO TABS: 20.0000 mg | ORAL_TABLET | Freq: Two times a day (BID) | ORAL | Status: DC

## 2013-07-15 DIAGNOSIS — K851 Biliary acute pancreatitis without necrosis or infection: Secondary | ICD-10-CM

## 2013-07-15 HISTORY — DX: Biliary acute pancreatitis without necrosis or infection: K85.10

## 2013-07-21 ENCOUNTER — Other Ambulatory Visit: Payer: Self-pay | Admitting: *Deleted

## 2013-07-21 MED ORDER — ATENOLOL 50 MG PO TABS: ORAL_TABLET | ORAL | Status: DC

## 2013-08-30 ENCOUNTER — Other Ambulatory Visit: Payer: Self-pay | Admitting: Internal Medicine

## 2013-09-02 ENCOUNTER — Telehealth: Payer: Self-pay | Admitting: *Deleted

## 2013-09-02 NOTE — Telephone Encounter (Signed)
error 

## 2013-09-14 ENCOUNTER — Telehealth: Payer: Self-pay | Admitting: *Deleted

## 2013-09-14 MED ORDER — DIAZEPAM 10 MG PO TABS: ORAL_TABLET | ORAL | Status: DC

## 2013-09-14 NOTE — Addendum Note (Signed)
Addended by: [name] E on: 09/14/2013 05:03 PM   Modules accepted: Orders

## 2013-09-14 NOTE — Telephone Encounter (Signed)
Rx printed, #60, no RF

## 2013-09-14 NOTE — Telephone Encounter (Signed)
rx refill- Diazepam 10mg  Last OV- 04/08/13 Last refilled- 08/20/13 # 8 / 4rf UDS- 04/08/13 LOW risk

## 2013-09-15 NOTE — Telephone Encounter (Signed)
rx faxed to [facility].

## 2013-09-20 ENCOUNTER — Other Ambulatory Visit: Payer: Self-pay | Admitting: Internal Medicine

## 2013-10-06 ENCOUNTER — Ambulatory Visit (INDEPENDENT_AMBULATORY_CARE_PROVIDER_SITE_OTHER): Payer: Medicare Other | Admitting: Internal Medicine

## 2013-10-06 ENCOUNTER — Encounter: Payer: Self-pay | Admitting: Internal Medicine

## 2013-10-06 VITALS — BP 109/64 | HR 65 | Temp 97.9°F | Wt 204.0 lb

## 2013-10-06 DIAGNOSIS — F528 Other sexual dysfunction not due to a substance or known physiological condition: Secondary | ICD-10-CM

## 2013-10-06 DIAGNOSIS — I10 Essential (primary) hypertension: Secondary | ICD-10-CM

## 2013-10-06 DIAGNOSIS — M549 Dorsalgia, unspecified: Secondary | ICD-10-CM

## 2013-10-06 DIAGNOSIS — R7989 Other specified abnormal findings of blood chemistry: Secondary | ICD-10-CM

## 2013-10-06 LAB — BASIC METABOLIC PANEL
BUN: 21 mg/dL (ref 6–23)
CALCIUM: 9 mg/dL (ref 8.4–10.5)
CHLORIDE: 108 meq/L (ref 96–112)
CO2: 25 meq/L (ref 19–32)
Creatinine, Ser: 1.3 mg/dL (ref 0.4–1.5)
GFR: 55.55 mL/min — ABNORMAL LOW (ref >60.00)
GLUCOSE: 92 mg/dL (ref 70–99)
Potassium: 3.8 mEq/L (ref 3.5–5.1)
Sodium: 142 mEq/L (ref 135–145)

## 2013-10-06 LAB — HEMOGLOBIN A1C: Hgb A1c MFr Bld: 5.8 % (ref 4.6–6.5)

## 2013-10-06 NOTE — Assessment & Plan Note (Signed)
History of ED, worse in the last few months. Had normal pedal and femoral pulses recently @ cardiology visit Has left over Viagra from a couple of years ago we agreed that he will try again. He will let me know if needs a refill

## 2013-10-06 NOTE — Assessment & Plan Note (Signed)
Due for labs

## 2013-10-06 NOTE — Assessment & Plan Note (Signed)
BP slightly elevated today, he is asymptomatic, ambulatory BPs in the 130s. No change, check a BMP

## 2013-10-06 NOTE — Patient Instructions (Signed)
Get your blood work before you leave   Next visit is for a physical exam in 6 months , fasting Please make an appointment

## 2013-10-06 NOTE — Progress Notes (Signed)
Pre visit review using our clinic review tool, if applicable. No additional management support is needed unless otherwise documented below in the visit note. 

## 2013-10-06 NOTE — Progress Notes (Signed)
Subjective:    Patient ID: Dustin Ashley, male    DOB: 1941/01/05, 72 y.o.   MRN: [id number]  DOS:  10/06/2013 Type of  visit: ROV Heart disease, saw cardiology 06/2013, felt to be stable. ED, symptoms has resurface, wonders if he is okay to try Viagra. Back pain, ongoing issue. He self discontinue diclofenac because he is afraid it may upset his kidney fx; pain  is worse when he walks, no radiation, decrease when he stops walking. Denies classic claudication, no calf pain. Because of the pain he has cut down on his physical activity.    ROS Denies chest pain or difficulty breathing No dysuria or gross hematuria States that sometimes  he "just doesn't like to do anything", I asked about depression and he denies any type of symptoms like that, he thinks part of the problems is the back pain, he is unable to exercise or enjoy a walk with his wife.   Past Medical History  Diagnosis Date  . HOH (hard of hearing)     has a hearing aid L, sees audiology rountinely  . Allergic rhinitis   . CAD (coronary artery disease)   . GERD (gastroesophageal reflux disease)   . Hyperlipemia   . Hypertension   . Hyperglycemia     A1C 5.8 04-2010  . Pain, joint, multiple sites     uses valium, occ uses for insomnia. uses for shoulder  pain  . Chronic renal insufficiency, stage II (mild)     Cr ~ 1.4, u/s 4-12 normal kidneys  . RAS (renal artery stenosis) 05-2012     Past Surgical History  Procedure Laterality Date  . Coronary artery bypass graft  1995    History   Social History  . Marital Status: Married    Spouse Name: N/A    Number of Children: 1  . Years of Education: N/A   Occupational History  . retired, delivery truck Geophysicist/field seismologist    Social History Main Topics  . Smoking status: Former Smoker    Quit date: 03/13/1976  . Smokeless tobacco: Never Used  . Alcohol Use: No  . Drug Use: No  . Sexual Activity: Not on file   Other Topics Concern  . Not on file   Social History  Narrative   Born in [place], has a large extended family, oldest of several brothers-sister             Medication List       This list is accurate as of: 10/06/13  6:44 PM.  Always use your most recent med list.               amLODipine 10 MG tablet  Commonly known as:  NORVASC  TAKE 1 TABLET BY MOUTH DAILY     aspirin 81 MG tablet  Take 81 mg by mouth daily.     atenolol 50 MG tablet  Commonly known as:  TENORMIN  Take 1 1/2 tablets by mouth once a day.     atorvastatin 20 MG tablet  Commonly known as:  LIPITOR  Take 1 tablet (20 mg total) by mouth daily.     benazepril 20 MG tablet  Commonly known as:  LOTENSIN  Take 1 tablet (20 mg total) by mouth 2 (two) times daily.     diazepam 10 MG tablet  Commonly known as:  VALIUM  TAKE ONE TABLET BY MOUTH EVERY 12 HOURS AS NEEDED FOR BACK SPASM OR PAIN     esomeprazole  40 MG capsule  Commonly known as:  NEXIUM  Take 1 capsule (40 mg total) by mouth daily as needed.     fexofenadine 180 MG tablet  Commonly known as:  ALLEGRA  Take 180 mg by mouth daily.     niacin 1000 MG CR tablet  Commonly known as:  NIASPAN  Take 1 tablet (1,000 mg total) by mouth at bedtime.     sildenafil 100 MG tablet  Commonly known as:  VIAGRA  Take 50-100 mg by mouth daily as needed for erectile dysfunction.           Objective:   Physical Exam BP 109/64  Pulse 65  Temp(Src) 97.9 F (36.6 C)  Wt 204 lb (92.534 kg)  SpO2 95% General -- alert, well-developed, NAD.  Lungs -- normal respiratory effort, no intercostal retractions, no accessory muscle use, and normal breath sounds.  Heart-- normal rate, regular rhythm, no murmur.  Abdomen-- Not distended, good bowel sounds,soft, non-tender. Extremities-- no pretibial edema bilaterally  Neurologic--  alert & oriented X3. Speech normal, gait normal, strength normal in all extremities.  Psych-- Cognition and judgment appear intact. Cooperative with normal attention span and  concentration. No anxious or depressed appearing.     Assessment & Plan:

## 2013-10-06 NOTE — Assessment & Plan Note (Addendum)
Ongoing issue, pain is decreasing his QOL. Self discontinued NSAIDs, does not like to risk his kidney function. Has seen orthopedics before, they talked about surgery but he was not ready for that. Intolerant to many pain medications. UDS low risk 03-2013 Plan: Refer to pain management, may benefit from physical therapy and a local injections

## 2013-10-07 ENCOUNTER — Telehealth: Payer: Self-pay | Admitting: Internal Medicine

## 2013-10-07 NOTE — Telephone Encounter (Signed)
emmi mailing mailed to patient

## 2013-10-08 ENCOUNTER — Encounter: Payer: Self-pay | Admitting: *Deleted

## 2013-11-18 ENCOUNTER — Other Ambulatory Visit: Payer: Self-pay | Admitting: Internal Medicine

## 2013-11-22 NOTE — Telephone Encounter (Signed)
Rx sent to the pharmacy by e-script.//AB/CMA 

## 2013-12-07 ENCOUNTER — Other Ambulatory Visit: Payer: Self-pay | Admitting: Internal Medicine

## 2013-12-16 ENCOUNTER — Other Ambulatory Visit: Payer: Self-pay | Admitting: Internal Medicine

## 2014-01-03 ENCOUNTER — Other Ambulatory Visit: Payer: Self-pay | Admitting: Internal Medicine

## 2014-01-19 ENCOUNTER — Other Ambulatory Visit: Payer: Self-pay | Admitting: Internal Medicine

## 2014-02-11 ENCOUNTER — Encounter (HOSPITAL_COMMUNITY): Payer: Self-pay | Admitting: Emergency Medicine

## 2014-02-11 ENCOUNTER — Emergency Department (HOSPITAL_COMMUNITY): Payer: Medicare Other

## 2014-02-11 ENCOUNTER — Inpatient Hospital Stay (HOSPITAL_COMMUNITY)
Admission: EM | Admit: 2014-02-11 | Discharge: 2014-02-16 | DRG: 419 | Disposition: A | Discharge status: Home / Self Care | Payer: Medicare Other | Attending: Internal Medicine | Admitting: Internal Medicine

## 2014-02-11 DIAGNOSIS — I701 Atherosclerosis of renal artery: Secondary | ICD-10-CM | POA: Diagnosis present

## 2014-02-11 DIAGNOSIS — R339 Retention of urine, unspecified: Secondary | ICD-10-CM | POA: Diagnosis not present

## 2014-02-11 DIAGNOSIS — Z6829 Body mass index (BMI) 29.0-29.9, adult: Secondary | ICD-10-CM | POA: Diagnosis not present

## 2014-02-11 DIAGNOSIS — R112 Nausea with vomiting, unspecified: Secondary | ICD-10-CM | POA: Diagnosis present

## 2014-02-11 DIAGNOSIS — R1013 Epigastric pain: Secondary | ICD-10-CM | POA: Diagnosis present

## 2014-02-11 DIAGNOSIS — E785 Hyperlipidemia, unspecified: Secondary | ICD-10-CM | POA: Diagnosis present

## 2014-02-11 DIAGNOSIS — G4700 Insomnia, unspecified: Secondary | ICD-10-CM | POA: Diagnosis present

## 2014-02-11 DIAGNOSIS — I2510 Atherosclerotic heart disease of native coronary artery without angina pectoris: Secondary | ICD-10-CM | POA: Diagnosis present

## 2014-02-11 DIAGNOSIS — H9190 Unspecified hearing loss, unspecified ear: Secondary | ICD-10-CM | POA: Diagnosis present

## 2014-02-11 DIAGNOSIS — D72829 Elevated white blood cell count, unspecified: Secondary | ICD-10-CM | POA: Diagnosis present

## 2014-02-11 DIAGNOSIS — N182 Chronic kidney disease, stage 2 (mild): Secondary | ICD-10-CM | POA: Diagnosis present

## 2014-02-11 DIAGNOSIS — Z860100 Personal history of colon polyps, unspecified: Secondary | ICD-10-CM

## 2014-02-11 DIAGNOSIS — K859 Acute pancreatitis without necrosis or infection, unspecified: Secondary | ICD-10-CM | POA: Diagnosis present

## 2014-02-11 DIAGNOSIS — E669 Obesity, unspecified: Secondary | ICD-10-CM | POA: Diagnosis present

## 2014-02-11 DIAGNOSIS — K811 Chronic cholecystitis: Principal | ICD-10-CM | POA: Diagnosis present

## 2014-02-11 DIAGNOSIS — Z951 Presence of aortocoronary bypass graft: Secondary | ICD-10-CM | POA: Diagnosis not present

## 2014-02-11 DIAGNOSIS — Z7982 Long term (current) use of aspirin: Secondary | ICD-10-CM

## 2014-02-11 DIAGNOSIS — Z8249 Family history of ischemic heart disease and other diseases of the circulatory system: Secondary | ICD-10-CM

## 2014-02-11 DIAGNOSIS — Z79899 Other long term (current) drug therapy: Secondary | ICD-10-CM

## 2014-02-11 DIAGNOSIS — Z886 Allergy status to analgesic agent status: Secondary | ICD-10-CM | POA: Diagnosis not present

## 2014-02-11 DIAGNOSIS — Z87891 Personal history of nicotine dependence: Secondary | ICD-10-CM

## 2014-02-11 DIAGNOSIS — I129 Hypertensive chronic kidney disease with stage 1 through stage 4 chronic kidney disease, or unspecified chronic kidney disease: Secondary | ICD-10-CM | POA: Diagnosis present

## 2014-02-11 DIAGNOSIS — K219 Gastro-esophageal reflux disease without esophagitis: Secondary | ICD-10-CM | POA: Diagnosis present

## 2014-02-11 DIAGNOSIS — N2889 Other specified disorders of kidney and ureter: Secondary | ICD-10-CM | POA: Diagnosis present

## 2014-02-11 DIAGNOSIS — I10 Essential (primary) hypertension: Secondary | ICD-10-CM | POA: Diagnosis present

## 2014-02-11 DIAGNOSIS — M25519 Pain in unspecified shoulder: Secondary | ICD-10-CM | POA: Diagnosis not present

## 2014-02-11 DIAGNOSIS — R52 Pain, unspecified: Secondary | ICD-10-CM

## 2014-02-11 HISTORY — DX: Personal history of colon polyps, unspecified: Z86.0100

## 2014-02-11 HISTORY — DX: Personal history of colonic polyps: Z86.010

## 2014-02-11 LAB — CBC WITH DIFFERENTIAL/PLATELET
BASOS PCT: 0 % (ref 0–1)
Basophils Absolute: 0 10*3/uL (ref 0.0–0.1)
Eosinophils Absolute: 0.2 10*3/uL (ref 0.0–0.7)
Eosinophils Relative: 1 % (ref 0–5)
HEMATOCRIT: 43.1 % (ref 39.0–52.0)
HEMOGLOBIN: 15.3 g/dL (ref 13.0–17.0)
LYMPHS ABS: 1.8 10*3/uL (ref 0.7–4.0)
Lymphocytes Relative: 12 % (ref 12–46)
MCH: 29.9 pg (ref 26.0–34.0)
MCHC: 35.5 g/dL (ref 30.0–36.0)
MCV: 84.3 fL (ref 78.0–100.0)
MONO ABS: 1.6 10*3/uL — AB (ref 0.1–1.0)
Monocytes Relative: 10 % (ref 3–12)
NEUTROS ABS: 11.6 10*3/uL — AB (ref 1.7–7.7)
NEUTROS PCT: 77 % (ref 43–77)
Platelets: 145 10*3/uL — ABNORMAL LOW (ref 150–400)
RBC: 5.11 MIL/uL (ref 4.22–5.81)
RDW: 12.8 % (ref 11.5–15.5)
WBC: 15.1 10*3/uL — ABNORMAL HIGH (ref 4.0–10.5)

## 2014-02-11 LAB — COMPREHENSIVE METABOLIC PANEL
ALBUMIN: 3.6 g/dL (ref 3.5–5.2)
ALT: 11 U/L (ref 0–53)
ANION GAP: 14 (ref 5–15)
AST: 22 U/L (ref 0–37)
Alkaline Phosphatase: 102 U/L (ref 39–117)
BILIRUBIN TOTAL: 0.8 mg/dL (ref 0.3–1.2)
BUN: 18 mg/dL (ref 6–23)
CHLORIDE: 95 meq/L — AB (ref 96–112)
CO2: 24 mEq/L (ref 19–32)
Calcium: 9 mg/dL (ref 8.4–10.5)
Creatinine, Ser: 1.23 mg/dL (ref 0.50–1.35)
GFR calc Af Amer: 65 mL/min — ABNORMAL LOW (ref >90)
GFR calc non Af Amer: 56 mL/min — ABNORMAL LOW (ref >90)
GLUCOSE: 119 mg/dL — AB (ref 70–99)
Potassium: 4 mEq/L (ref 3.7–5.3)
Sodium: 133 mEq/L — ABNORMAL LOW (ref 137–147)
Total Protein: 7.1 g/dL (ref 6.0–8.3)

## 2014-02-11 LAB — LIPASE, BLOOD: Lipase: 47 U/L (ref 11–59)

## 2014-02-11 MED ORDER — HYDROMORPHONE HCL PF 1 MG/ML IJ SOLN
1.0000 mg | Freq: Once | INTRAMUSCULAR | Status: AC
  Administered 2014-02-11: 1 mg via INTRAVENOUS
  Filled 2014-02-11: qty 1

## 2014-02-11 MED ORDER — ONDANSETRON HCL 4 MG/2ML IJ SOLN
4.0000 mg | Freq: Once | INTRAMUSCULAR | Status: AC
  Administered 2014-02-11: 4 mg via INTRAVENOUS
  Filled 2014-02-11: qty 2

## 2014-02-11 MED ORDER — IOHEXOL 300 MG/ML  SOLN
100.0000 mL | Freq: Once | INTRAMUSCULAR | Status: AC | PRN
  Administered 2014-02-11: 100 mL via INTRAVENOUS

## 2014-02-11 MED ORDER — IOHEXOL 300 MG/ML  SOLN
25.0000 mL | Freq: Once | INTRAMUSCULAR | Status: AC | PRN
Start: 2014-02-11 — End: 2014-02-11
  Administered 2014-02-11: 25 mL via ORAL

## 2014-02-11 MED ORDER — SODIUM CHLORIDE 0.9 % IV BOLUS (SEPSIS)
1000.0000 mL | Freq: Once | INTRAVENOUS | Status: AC
  Administered 2014-02-11: 1000 mL via INTRAVENOUS

## 2014-02-11 NOTE — ED Notes (Signed)
Pt in c/o abd pain with vomiting, also abdominal distention, pt with low grade fever at home, sent here for evaluation of pancreatitis or gallbladder problems

## 2014-02-11 NOTE — ED Notes (Signed)
hospitalist at the bedside 

## 2014-02-11 NOTE — ED Provider Notes (Signed)
CSN: [id number]     Arrival date & time 02/11/14  1840 History   First MD Initiated Contact with Patient 02/11/14 1923     Chief Complaint  Patient presents with  . Abdominal Pain     (Consider location/radiation/quality/duration/timing/severity/associated sxs/prior Treatment) HPI 73 year old male who presents today complaining of epigastric pain present for 2 days. It was somewhat gradual in onset. The pain radiates from his epigastrium to bilateral flanks. He is nauseated. He vomited the evening that began. Since then. Has been painful so he has been very little. He has not noted any fever or chills. He has not had any diarrhea. None no similar symptoms in the past. The pain is moderate to severe in nature.  Past Medical History  Diagnosis Date  . HOH (hard of hearing)     has a hearing aid L, sees audiology rountinely  . Allergic rhinitis   . CAD (coronary artery disease)   . GERD (gastroesophageal reflux disease)   . Hyperlipemia   . Hypertension   . Hyperglycemia     A1C 5.8 04-2010  . Pain, joint, multiple sites     uses valium, occ uses for insomnia. uses for shoulder  pain  . Chronic renal insufficiency, stage II (mild)     Cr ~ 1.4, u/s 4-12 normal kidneys  . RAS (renal artery stenosis) 05-2012    Past Surgical History  Procedure Laterality Date  . Coronary artery bypass graft  1995   Family History  Problem Relation Age of Onset  . Hypertension Father   . Heart attack      2 brothers, CABG  . Cancer Neg Hx     colon, prostate  . Diabetes Neg Hx    History  Substance Use Topics  . Smoking status: Former Smoker    Quit date: 03/13/1976  . Smokeless tobacco: Never Used  . Alcohol Use: No    Review of Systems  All other systems reviewed and are negative.     Allergies  Hydrocodone-acetaminophen and Tramadol hcl  Home Medications   Prior to Admission medications   Medication Sig Start Date End Date Taking? Authorizing Provider  amLODipine (NORVASC)  10 MG tablet Take 10 mg by mouth daily.   Yes Historical Provider, MD  aspirin 81 MG tablet Take 81 mg by mouth daily.     Yes Historical Provider, MD  atenolol (TENORMIN) 50 MG tablet Take 75 mg by mouth daily.   Yes Historical Provider, MD  atorvastatin (LIPITOR) 20 MG tablet Take 20 mg by mouth daily.    Yes Historical Provider, MD  benazepril (LOTENSIN) 20 MG tablet Take 20 mg by mouth 2 (two) times daily.   Yes Historical Provider, MD  diazepam (VALIUM) 10 MG tablet Take 10 mg by mouth every 12 (twelve) hours as needed for anxiety.   Yes Historical Provider, MD  esomeprazole (NEXIUM) 40 MG capsule Take 1 capsule (40 mg total) by mouth daily as needed. 06/08/13  Yes Colon Branch, MD  fexofenadine (ALLEGRA) 180 MG tablet Take 180 mg by mouth daily.     Yes Historical Provider, MD  niacin (NIASPAN) 1000 MG CR tablet Take 1 tablet (1,000 mg total) by mouth at bedtime. 04/30/12  Yes Colon Branch, MD  sildenafil (VIAGRA) 100 MG tablet Take 50-100 mg by mouth daily as needed for erectile dysfunction.   Yes Historical Provider, MD   BP 127/57  Pulse 69  Temp(Src) 99.9 F (37.7 C) (Rectal)  Resp 16  SpO2  91% Physical Exam  Nursing note and vitals reviewed. Constitutional: He is oriented to person, place, and time. He appears well-developed and well-nourished. No distress.  HENT:  Head: Normocephalic and atraumatic.  Right Ear: External ear normal.  Left Ear: External ear normal.  Nose: Nose normal.  Mouth/Throat: Oropharynx is clear and moist.  Eyes: Conjunctivae and EOM are normal. Pupils are equal, round, and reactive to light.  Neck: Normal range of motion. Neck supple.  Cardiovascular: Normal rate, regular rhythm, normal heart sounds and intact distal pulses.   Pulmonary/Chest: Effort normal and breath sounds normal.  Abdominal: Soft. Bowel sounds are normal. He exhibits no distension and no mass. There is tenderness. There is no rebound and no guarding.  Musculoskeletal: Normal range of  motion.  Neurological: He is alert and oriented to person, place, and time. He has normal reflexes.  Skin: Skin is warm and dry.  Psychiatric: He has a normal mood and affect. His behavior is normal. Judgment and thought content normal.    ED Course  Procedures (including critical care time) Labs Review Labs Reviewed  CBC WITH DIFFERENTIAL - Abnormal; Notable for the following:    WBC 15.1 (*)    Platelets 145 (*)    Neutro Abs 11.6 (*)    Monocytes Absolute 1.6 (*)    All other components within normal limits  COMPREHENSIVE METABOLIC PANEL - Abnormal; Notable for the following:    Sodium 133 (*)    Chloride 95 (*)    Glucose, Bld 119 (*)    GFR calc non Af Amer 56 (*)    GFR calc Af Amer 65 (*)    All other components within normal limits  LIPASE, BLOOD  URINALYSIS, ROUTINE W REFLEX MICROSCOPIC    Imaging Review Ct Abdomen Pelvis W Contrast  02/11/2014   CLINICAL DATA:  Upper abdominal pain since Wednesday. Nausea and vomiting.  EXAM: CT ABDOMEN AND PELVIS WITH CONTRAST  TECHNIQUE: Multidetector CT imaging of the abdomen and pelvis was performed using the standard protocol following bolus administration of intravenous contrast.  CONTRAST:  156mL OMNIPAQUE IOHEXOL 300 MG/ML  SOLN  COMPARISON:  Ultrasound kidneys 11/05/2010  FINDINGS: Dependent changes in the lung bases. Small esophageal hiatal hernia. Postoperative changes in the mediastinum.  But there is evidence of minimal infiltration in the fat around the head of the pancreas which, given the patient's symptoms could indicate early changes of acute focal pancreatitis. No loculated fluid collections are demonstrated in the pancreatic parenchymal enhancement is normal. Correlation with laboratory studies is recommended.  The liver, spleen, gallbladder, adrenal glands, kidneys, inferior vena cava, and retroperitoneal lymph nodes are unremarkable. Calcification of the abdominal aorta without aneurysm or dissection. Stomach, small bowel,  and colon are normal for degree of distention. Scattered stool in the colon. No free air or free fluid in the abdomen. Small amount of fat in the umbilicus.  Pelvis: Appendix is normal. Scattered diverticula in the sigmoid colon without evidence of diverticulitis. Prostate gland is not enlarged. Bladder wall is not thickened. No free or loculated pelvic fluid collections. No pelvic mass or lymphadenopathy. 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 Degenerative changes in the lumbar spine. No destructive bone lesions appreciated.  IMPRESSION: Mild infiltration suggested around the head of the pancreas, possibly indicating early acute pancreatitis. Small esophageal hiatal hernia. 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   Electronically Signed   By: Lucienne Capers M.D.   On: 02/11/2014 22:30     EKG Interpretation None  MDM   Final diagnoses:  Acute pancreatitis, unspecified pancreatitis type    73 year old male with 2 day history of epigastric pain. Labs are essentially normal the exception of elevated white count of 15,000. CT scan shows inflammation around the head of the pancreas which is consistent with where the patient's pain is. Patient's care was discussed with Dr. Shanon Brow and he will be admitted to a MedSurg bed for further treatment. Pain is greatly improved here with 1 mg of IV Dilaudid. He has remained hemodynamically stable here in the emergency department.   Shaune Pollack, MD 02/12/14 [id number]

## 2014-02-11 NOTE — H&P (Signed)
PCP:   [name], MD   Chief Complaint:  abd pain  HPI: 73 yo male h/o cabg, cad, htn comes in with 2 days of persistent epigastric pain that started 2 days ago after eating a [place] and bacon sub.  He vomited once after that meal, and started with abd pain.  This has worsened over the next day or so and is now persistent since last night associated with a lot of nausea but has not vomited again.  He cannot eat.  Subjective fever.  No diarrhea.  Still has his gallbladder.  Has never had this type of pain before.  His pain is much better now after receiving some iv dilaudid in the ED.  No sob.  No cp.  No etoh use and no recent medication changes.  Review of Systems:  Positive and negative as per HPI otherwise all other systems are negative  Past Medical History: Past Medical History  Diagnosis Date  . HOH (hard of hearing)     has a hearing aid L, sees audiology rountinely  . Allergic rhinitis   . CAD (coronary artery disease)   . GERD (gastroesophageal reflux disease)   . Hyperlipemia   . Hypertension   . Hyperglycemia     A1C 5.8 04-2010  . Pain, joint, multiple sites     uses valium, occ uses for insomnia. uses for shoulder  pain  . Chronic renal insufficiency, stage II (mild)     Cr ~ 1.4, u/s 4-12 normal kidneys  . RAS (renal artery stenosis) 05-2012    Past Surgical History  Procedure Laterality Date  . Coronary artery bypass graft  1995    Medications: Prior to Admission medications   Medication Sig Start Date End Date Taking? Authorizing Provider  amLODipine (NORVASC) 10 MG tablet Take 10 mg by mouth daily.   Yes Historical Provider, MD  aspirin 81 MG tablet Take 81 mg by mouth daily.     Yes Historical Provider, MD  atenolol (TENORMIN) 50 MG tablet Take 75 mg by mouth daily.   Yes Historical Provider, MD  atorvastatin (LIPITOR) 20 MG tablet Take 20 mg by mouth daily.    Yes Historical Provider, MD  benazepril (LOTENSIN) 20 MG tablet Take 20 mg by mouth 2 (two)  times daily.   Yes Historical Provider, MD  diazepam (VALIUM) 10 MG tablet Take 10 mg by mouth every 12 (twelve) hours as needed for anxiety.   Yes Historical Provider, MD  esomeprazole (NEXIUM) 40 MG capsule Take 1 capsule (40 mg total) by mouth daily as needed. 06/08/13  Yes Colon Branch, MD  fexofenadine (ALLEGRA) 180 MG tablet Take 180 mg by mouth daily.     Yes Historical Provider, MD  niacin (NIASPAN) 1000 MG CR tablet Take 1 tablet (1,000 mg total) by mouth at bedtime. 04/30/12  Yes Colon Branch, MD  sildenafil (VIAGRA) 100 MG tablet Take 50-100 mg by mouth daily as needed for erectile dysfunction.   Yes Historical Provider, MD    Allergies:   Allergies  Allergen Reactions  . Hydrocodone-Acetaminophen Other (See Comments)    REACTION: bladder obstruction  . Tramadol Hcl Other (See Comments)    REACTION: bladder obstruction    Social History:  reports that he quit smoking about 37 years ago. He has never used smokeless tobacco. He reports that he does not drink alcohol or use illicit drugs.  Family History: Family History  Problem Relation Age of Onset  . Hypertension Father   .  Heart attack      2 brothers, CABG  . Cancer Neg Hx     colon, prostate  . Diabetes Neg Hx     Physical Exam: Filed Vitals:   02/11/14 2115 02/11/14 2130 02/11/14 2131 02/11/14 2145  BP: 160/53 174/60  127/57  Pulse: 59 66  69  Temp:   99.9 F (37.7 C)   TempSrc:   Rectal   Resp: 20 16  16   SpO2: 95% 94%  91%   General appearance: alert, cooperative and no distress Head: Normocephalic, without obvious abnormality, atraumatic Eyes: negative Nose: Nares normal. Septum midline. Mucosa normal. No drainage or sinus tenderness. Neck: no JVD and supple, symmetrical, trachea midline Lungs: clear to auscultation bilaterally Heart: regular rate and rhythm, S1, S2 normal, no murmur, click, rub or gallop Abdomen: soft ttp mildly epigastric some bs nd  no r/g dilaudid on board Extremities: extremities  normal, atraumatic, no cyanosis or edema Pulses: 2+ and symmetric Skin: Skin color, texture, turgor normal. No rashes or lesions Neurologic: Grossly normal    Labs on Admission:   Recent Labs  02/11/14 1851  NA 133*  K 4.0  CL 95*  CO2 24  GLUCOSE 119*  BUN 18  CREATININE 1.23  CALCIUM 9.0    Recent Labs  02/11/14 1851  AST 22  ALT 11  ALKPHOS 102  BILITOT 0.8  PROT 7.1  ALBUMIN 3.6    Recent Labs  02/11/14 1851  LIPASE 47    Recent Labs  02/11/14 1851  WBC 15.1*  NEUTROABS 11.6*  HGB 15.3  HCT 43.1  MCV 84.3  PLT 145*    Radiological Exams on Admission: Ct Abdomen Pelvis W Contrast  02/11/2014   CLINICAL DATA:  Upper abdominal pain since Wednesday. Nausea and vomiting.  EXAM: CT ABDOMEN AND PELVIS WITH CONTRAST  TECHNIQUE: Multidetector CT imaging of the abdomen and pelvis was performed using the standard protocol following bolus administration of intravenous contrast.  CONTRAST:  169mL OMNIPAQUE IOHEXOL 300 MG/ML  SOLN  COMPARISON:  Ultrasound kidneys 11/05/2010  FINDINGS: Dependent changes in the lung bases. Small esophageal hiatal hernia. Postoperative changes in the mediastinum.  But there is evidence of minimal infiltration in the fat around the head of the pancreas which, given the patient's symptoms could indicate early changes of acute focal pancreatitis. No loculated fluid collections are demonstrated in the pancreatic parenchymal enhancement is normal. Correlation with laboratory studies is recommended.  The liver, spleen, gallbladder, adrenal glands, kidneys, inferior vena cava, and retroperitoneal lymph nodes are unremarkable. Calcification of the abdominal aorta without aneurysm or dissection. Stomach, small bowel, and colon are normal for degree of distention. Scattered stool in the colon. No free air or free fluid in the abdomen. Small amount of fat in the umbilicus.  Pelvis: Appendix is normal. Scattered diverticula in the sigmoid colon without  evidence of diverticulitis. Prostate gland is not enlarged. Bladder wall is not thickened. No free or loculated pelvic fluid collections. No pelvic mass or lymphadenopathy. 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 Degenerative changes in the lumbar spine. No destructive bone lesions appreciated.  IMPRESSION: Mild infiltration suggested around the head of the pancreas, possibly indicating early acute pancreatitis. Small esophageal hiatal hernia. 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   Electronically Signed   By: Lucienne Capers M.D.   On: 02/11/2014 22:30    Assessment/Plan  73 yo male with acute epigastric pain for last 2 days  Principal Problem:   Abdominal pain, acute, epigastric-  Clinically appears like acute pancreatitis, but unusual that lipase is normal.  lfts are also normal.  Will obtain abd ultrasound to assess gallbladder better.  Ivf.  Bowel rest, keep npo x meds.  Will also give only one dose of lovenox until clear he does not need surgical intervention, not emergently needed at this time.  Repeat cmp and lipase in am.  Will likely need surgical evaluation tomorrow.  Cont prn dilaudid and zofran for symptomatic relief.  Will also check lactic acid level now.  Active Problems:  Stable unless o/w noted.   HYPERTENSION   CORONARY ARTERY DISEASE s/p CABG-  Will also ck trop x 1 and 12 lead ekg just to make sure this is not atypical presentation of ACS.   Chronic renal insufficiency, stage II (mild)   RAS (renal artery stenosis)   Nausea & vomiting  Admit to med surg.  Full code.  (if trop or ekg markedly abnormal, will have to change bed and plan)  Myleka Moncure A 02/11/2014, 11:20 PM

## 2014-02-12 ENCOUNTER — Encounter (HOSPITAL_COMMUNITY): Payer: Self-pay | Admitting: *Deleted

## 2014-02-12 ENCOUNTER — Inpatient Hospital Stay (HOSPITAL_COMMUNITY): Payer: Medicare Other

## 2014-02-12 DIAGNOSIS — K811 Chronic cholecystitis: Secondary | ICD-10-CM

## 2014-02-12 DIAGNOSIS — R112 Nausea with vomiting, unspecified: Secondary | ICD-10-CM

## 2014-02-12 DIAGNOSIS — K828 Other specified diseases of gallbladder: Secondary | ICD-10-CM

## 2014-02-12 DIAGNOSIS — R1013 Epigastric pain: Secondary | ICD-10-CM

## 2014-02-12 DIAGNOSIS — K859 Acute pancreatitis without necrosis or infection, unspecified: Secondary | ICD-10-CM

## 2014-02-12 LAB — AMYLASE: Amylase: 48 U/L (ref 0–105)

## 2014-02-12 LAB — COMPREHENSIVE METABOLIC PANEL
ALBUMIN: 3.2 g/dL — AB (ref 3.5–5.2)
ALK PHOS: 97 U/L (ref 39–117)
ALT: 9 U/L (ref 0–53)
AST: 21 U/L (ref 0–37)
Anion gap: 12 (ref 5–15)
BUN: 19 mg/dL (ref 6–23)
CO2: 22 mEq/L (ref 19–32)
Calcium: 8.7 mg/dL (ref 8.4–10.5)
Chloride: 98 mEq/L (ref 96–112)
Creatinine, Ser: 1.34 mg/dL (ref 0.50–1.35)
GFR calc non Af Amer: 51 mL/min — ABNORMAL LOW (ref >90)
GFR, EST AFRICAN AMERICAN: 59 mL/min — AB (ref >90)
Glucose, Bld: 95 mg/dL (ref 70–99)
POTASSIUM: 4.4 meq/L (ref 3.7–5.3)
SODIUM: 132 meq/L — AB (ref 137–147)
TOTAL PROTEIN: 6.7 g/dL (ref 6.0–8.3)
Total Bilirubin: 0.7 mg/dL (ref 0.3–1.2)

## 2014-02-12 LAB — CBC
HEMATOCRIT: 40.8 % (ref 39.0–52.0)
HEMOGLOBIN: 13.9 g/dL (ref 13.0–17.0)
MCH: 28.7 pg (ref 26.0–34.0)
MCHC: 34.1 g/dL (ref 30.0–36.0)
MCV: 84.1 fL (ref 78.0–100.0)
Platelets: 165 10*3/uL (ref 150–400)
RBC: 4.85 MIL/uL (ref 4.22–5.81)
RDW: 13 % (ref 11.5–15.5)
WBC: 13.6 10*3/uL — ABNORMAL HIGH (ref 4.0–10.5)

## 2014-02-12 LAB — PROTIME-INR
INR: 1.08 (ref 0.00–1.49)
Prothrombin Time: 14 seconds (ref 11.6–15.2)

## 2014-02-12 LAB — URINALYSIS, ROUTINE W REFLEX MICROSCOPIC
Bilirubin Urine: NEGATIVE
GLUCOSE, UA: NEGATIVE mg/dL
Hgb urine dipstick: NEGATIVE
KETONES UR: NEGATIVE mg/dL
LEUKOCYTES UA: NEGATIVE
NITRITE: NEGATIVE
Protein, ur: NEGATIVE mg/dL
Specific Gravity, Urine: 1.024 (ref 1.005–1.030)
Urobilinogen, UA: 0.2 mg/dL (ref 0.0–1.0)
pH: 6.5 (ref 5.0–8.0)

## 2014-02-12 LAB — I-STAT TROPONIN, ED: Troponin i, poc: 0 ng/mL (ref 0.00–0.08)

## 2014-02-12 LAB — LIPID PANEL
CHOL/HDL RATIO: 3.1 ratio
Cholesterol: 123 mg/dL (ref 0–200)
HDL: 40 mg/dL (ref >39)
LDL CALC: 58 mg/dL (ref 0–99)
Triglycerides: 126 mg/dL (ref <150)
VLDL: 25 mg/dL (ref 0–40)

## 2014-02-12 LAB — LIPASE, BLOOD: Lipase: 56 U/L (ref 11–59)

## 2014-02-12 LAB — LACTIC ACID, PLASMA: LACTIC ACID, VENOUS: 1.3 mmol/L (ref 0.5–2.2)

## 2014-02-12 MED ORDER — POTASSIUM CHLORIDE IN NACL 20-0.9 MEQ/L-% IV SOLN
INTRAVENOUS | Status: AC
  Administered 2014-02-12 – 2014-02-14 (×4): via INTRAVENOUS
  Filled 2014-02-12 (×6): qty 1000

## 2014-02-12 MED ORDER — ACETAMINOPHEN 325 MG PO TABS
650.0000 mg | ORAL_TABLET | Freq: Four times a day (QID) | ORAL | Status: DC | PRN
  Administered 2014-02-13: 650 mg via ORAL
  Filled 2014-02-12 (×2): qty 2

## 2014-02-12 MED ORDER — SINCALIDE 5 MCG IJ SOLR
INTRAMUSCULAR | Status: AC
  Filled 2014-02-12: qty 5

## 2014-02-12 MED ORDER — HEPARIN SODIUM (PORCINE) 5000 UNIT/ML IJ SOLN
5000.0000 [IU] | Freq: Three times a day (TID) | INTRAMUSCULAR | Status: AC
  Administered 2014-02-12 – 2014-02-13 (×4): 5000 [IU] via SUBCUTANEOUS
  Filled 2014-02-12 (×6): qty 1

## 2014-02-12 MED ORDER — PANTOPRAZOLE SODIUM 40 MG PO TBEC
40.0000 mg | DELAYED_RELEASE_TABLET | Freq: Two times a day (BID) | ORAL | Status: DC
  Administered 2014-02-12 – 2014-02-16 (×8): 40 mg via ORAL
  Filled 2014-02-12 (×8): qty 1

## 2014-02-12 MED ORDER — BENAZEPRIL HCL 20 MG PO TABS
20.0000 mg | ORAL_TABLET | Freq: Two times a day (BID) | ORAL | Status: DC
  Administered 2014-02-12: 20 mg via ORAL
  Filled 2014-02-12 (×3): qty 1

## 2014-02-12 MED ORDER — HYDRALAZINE HCL 20 MG/ML IJ SOLN: 10.0000 mg | Freq: Four times a day (QID) | INTRAMUSCULAR | Status: DC | PRN

## 2014-02-12 MED ORDER — HYDROMORPHONE HCL PF 1 MG/ML IJ SOLN
1.0000 mg | INTRAMUSCULAR | Status: DC | PRN
  Administered 2014-02-12 – 2014-02-13 (×7): 1 mg via INTRAVENOUS
  Filled 2014-02-12 (×8): qty 1

## 2014-02-12 MED ORDER — AMLODIPINE BESYLATE 10 MG PO TABS
10.0000 mg | ORAL_TABLET | Freq: Every day | ORAL | Status: DC
  Administered 2014-02-12 – 2014-02-16 (×4): 10 mg via ORAL
  Filled 2014-02-12 (×5): qty 1

## 2014-02-12 MED ORDER — ONDANSETRON HCL 4 MG/2ML IJ SOLN
4.0000 mg | Freq: Four times a day (QID) | INTRAMUSCULAR | Status: DC | PRN
  Administered 2014-02-12 – 2014-02-15 (×2): 4 mg via INTRAVENOUS
  Filled 2014-02-12 (×3): qty 2

## 2014-02-12 MED ORDER — ATORVASTATIN CALCIUM 20 MG PO TABS
20.0000 mg | ORAL_TABLET | Freq: Every day | ORAL | Status: DC
  Administered 2014-02-12 – 2014-02-16 (×4): 20 mg via ORAL
  Filled 2014-02-12 (×5): qty 1

## 2014-02-12 MED ORDER — BENAZEPRIL HCL 10 MG PO TABS
10.0000 mg | ORAL_TABLET | Freq: Every day | ORAL | Status: DC
  Administered 2014-02-13 – 2014-02-16 (×3): 10 mg via ORAL
  Filled 2014-02-12 (×4): qty 1

## 2014-02-12 MED ORDER — ONDANSETRON HCL 4 MG PO TABS: 4.0000 mg | ORAL_TABLET | Freq: Four times a day (QID) | ORAL | Status: DC | PRN

## 2014-02-12 MED ORDER — ATENOLOL 50 MG PO TABS
75.0000 mg | ORAL_TABLET | Freq: Every day | ORAL | Status: DC
  Administered 2014-02-12 – 2014-02-14 (×3): 75 mg via ORAL
  Administered 2014-02-15: 25 mg via ORAL
  Administered 2014-02-15: 50 mg via ORAL
  Administered 2014-02-16: 75 mg via ORAL
  Filled 2014-02-12 (×5): qty 1

## 2014-02-12 MED ORDER — ENOXAPARIN SODIUM 40 MG/0.4ML ~~LOC~~ SOLN
40.0000 mg | Freq: Once | SUBCUTANEOUS | Status: AC
  Administered 2014-02-12: 40 mg via SUBCUTANEOUS
  Filled 2014-02-12: qty 0.4

## 2014-02-12 MED ORDER — DIAZEPAM 5 MG PO TABS: 10.0000 mg | ORAL_TABLET | Freq: Two times a day (BID) | ORAL | Status: DC | PRN

## 2014-02-12 MED ORDER — TECHNETIUM TC 99M MEBROFENIN IV KIT
5.0000 | PACK | Freq: Once | INTRAVENOUS | Status: AC | PRN
  Administered 2014-02-12: 5 via INTRAVENOUS

## 2014-02-12 MED ORDER — POTASSIUM CHLORIDE IN NACL 20-0.9 MEQ/L-% IV SOLN
INTRAVENOUS | Status: DC
  Administered 2014-02-12: 01:00:00 via INTRAVENOUS
  Filled 2014-02-12: qty 1000

## 2014-02-12 NOTE — Plan of Care (Signed)
Problem: Phase I Progression Outcomes Goal: Hemodynamically stable Outcome: Progressing Patient admitted from [facility] ED for further management of abdominal pain.  He arrived via stretcher in no apparent distress.  He is alert and oriented x4, ambulatory with steady gait.  He was oriented to room and floor procedures.  Medicated for acute pain and is resting quietly at present.  Will continue to monitor patient condition.

## 2014-02-12 NOTE — Consult Note (Signed)
Reason for Consult:possible cholecystitis Referring Physician: Dr. Rondel Oh Dustin Ashley is an 73 y.o. male.  HPI: I have been asked to evaluate this patient by gastroenterology. The patient was admitted yesterday. Approximately 3-1/2 days ago he developed severe epigastric abdominal pain. It hurt to both his left and right upper quadrant, more so to the right. He also has some suggestive fever. He was admitted yesterday. He was found to have an elevated white blood count. His initial CAT scan suggested possible focal pancreatitis. Amylase and lipase were normal. CAT scan and ultrasound of the gallbladder were unremarkable. He has since had a HIDA scan showing him to have a 6% gallbladder ejection fraction. His symptoms were reproduced with CCK administration. He reports no prior history of abdominal pain. He is feeling better today with less pain but no appetite. He has no previous history of ulcer disease.  Past Medical History  Diagnosis Date  . HOH (hard of hearing)     has a hearing aid L, sees audiology rountinely  . Allergic rhinitis   . CAD (coronary artery disease)   . GERD (gastroesophageal reflux disease)   . Hyperlipemia   . Hypertension   . Hyperglycemia     A1C 5.8 04-2010  . Pain, joint, multiple sites     uses valium, occ uses for insomnia. uses for shoulder  pain  . Chronic renal insufficiency, stage II (mild)     Cr ~ 1.4, u/s 4-12 normal kidneys  . 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 Personal history of colonic polyps     Past Surgical History  Procedure Laterality Date  . Coronary artery bypass graft  1995  . Colonoscopy  multiple    Family History  Problem Relation Age of Onset  . Hypertension Father   . Heart attack      2 brothers, CABG  . Cancer Neg Hx     colon, prostate  . Diabetes Neg Hx     Social History:  reports that he quit smoking about 37 years ago. He has never used smokeless tobacco. He reports that he does not drink alcohol or use  illicit drugs.  Allergies:  Allergies  Allergen Reactions  . Hydrocodone-Acetaminophen Other (See Comments)    REACTION: bladder obstruction  . Tramadol Hcl Other (See Comments)    REACTION: bladder obstruction    Medications: I have reviewed the patient's current medications.  Results for orders placed during the hospital encounter of 02/11/14 (from the past 48 hour(s))  CBC WITH DIFFERENTIAL     Status: Abnormal   Collection Time    02/11/14  6:51 PM      Result Value Ref Range   WBC 15.1 (*) 4.0 - 10.5 K/uL   RBC 5.11  4.22 - 5.81 MIL/uL   Hemoglobin 15.3  13.0 - 17.0 g/dL   HCT 43.1  39.0 - 52.0 %   MCV 84.3  78.0 - 100.0 fL   MCH 29.9  26.0 - 34.0 pg   MCHC 35.5  30.0 - 36.0 g/dL   RDW 12.8  11.5 - 15.5 %   Platelets 145 (*) 150 - 400 K/uL   Neutrophils Relative % 77  43 - 77 %   Neutro Abs 11.6 (*) 1.7 - 7.7 K/uL   Lymphocytes Relative 12  12 - 46 %   Lymphs Abs 1.8  0.7 - 4.0 K/uL   Monocytes Relative 10  3 - 12 %   Monocytes Absolute 1.6 (*) 0.1 -  1.0 K/uL   Eosinophils Relative 1  0 - 5 %   Eosinophils Absolute 0.2  0.0 - 0.7 K/uL   Basophils Relative 0  0 - 1 %   Basophils Absolute 0.0  0.0 - 0.1 K/uL  COMPREHENSIVE METABOLIC PANEL     Status: Abnormal   Collection Time    02/11/14  6:51 PM      Result Value Ref Range   Sodium 133 (*) 137 - 147 mEq/L   Potassium 4.0  3.7 - 5.3 mEq/L   Chloride 95 (*) 96 - 112 mEq/L   CO2 24  19 - 32 mEq/L   Glucose, Bld 119 (*) 70 - 99 mg/dL   BUN 18  6 - 23 mg/dL   Creatinine, Ser 1.23  0.50 - 1.35 mg/dL   Calcium 9.0  8.4 - 10.5 mg/dL   Total Protein 7.1  6.0 - 8.3 g/dL   Albumin 3.6  3.5 - 5.2 g/dL   AST 22  0 - 37 U/L   ALT 11  0 - 53 U/L   Alkaline Phosphatase 102  39 - 117 U/L   Total Bilirubin 0.8  0.3 - 1.2 mg/dL   GFR calc non Af Amer 56 (*) >90 mL/min   GFR calc Af Amer 65 (*) >90 mL/min   Comment: (NOTE)     The eGFR has been calculated using the CKD EPI equation.     This calculation has not been  validated in all clinical situations.     eGFR's persistently <90 mL/min signify possible Chronic Kidney     Disease.   Anion gap 14  5 - 15  LIPASE, BLOOD     Status: None   Collection Time    02/11/14  6:51 PM      Result Value Ref Range   Lipase 47  11 - 59 U/L  LACTIC ACID, PLASMA     Status: None   Collection Time    02/11/14 11:47 PM      Result Value Ref Range   Lactic Acid, Venous 1.3  0.5 - 2.2 mmol/L  I-STAT TROPOININ, ED     Status: None   Collection Time    02/11/14 11:57 PM      Result Value Ref Range   Troponin i, poc 0.00  0.00 - 0.08 ng/mL   Comment 3            Comment: Due to the release kinetics of cTnI,     a negative result within the first hours     of the onset of symptoms does not rule out     myocardial infarction with certainty.     If myocardial infarction is still suspected,     repeat the test at appropriate intervals.  URINALYSIS, ROUTINE W REFLEX MICROSCOPIC     Status: None   Collection Time    02/12/14 12:31 AM      Result Value Ref Range   Color, Urine YELLOW  YELLOW   APPearance CLEAR  CLEAR   Specific Gravity, Urine 1.024  1.005 - 1.030   pH 6.5  5.0 - 8.0   Glucose, UA NEGATIVE  NEGATIVE mg/dL   Hgb urine dipstick NEGATIVE  NEGATIVE   Bilirubin Urine NEGATIVE  NEGATIVE   Ketones, ur NEGATIVE  NEGATIVE mg/dL   Protein, ur NEGATIVE  NEGATIVE mg/dL   Urobilinogen, UA 0.2  0.0 - 1.0 mg/dL   Nitrite NEGATIVE  NEGATIVE   Leukocytes, UA NEGATIVE  NEGATIVE  Comment: MICROSCOPIC NOT DONE ON URINES WITH NEGATIVE PROTEIN, BLOOD, LEUKOCYTES, NITRITE, OR GLUCOSE <1000 mg/dL.  COMPREHENSIVE METABOLIC PANEL     Status: Abnormal   Collection Time    02/12/14  5:55 AM      Result Value Ref Range   Sodium 132 (*) 137 - 147 mEq/L   Potassium 4.4  3.7 - 5.3 mEq/L   Chloride 98  96 - 112 mEq/L   CO2 22  19 - 32 mEq/L   Glucose, Bld 95  70 - 99 mg/dL   BUN 19  6 - 23 mg/dL   Creatinine, Ser 1.34  0.50 - 1.35 mg/dL   Calcium 8.7  8.4 - 10.5 mg/dL    Total Protein 6.7  6.0 - 8.3 g/dL   Albumin 3.2 (*) 3.5 - 5.2 g/dL   AST 21  0 - 37 U/L   Comment: HEMOLYSIS AT THIS LEVEL MAY AFFECT RESULT   ALT 9  0 - 53 U/L   Alkaline Phosphatase 97  39 - 117 U/L   Total Bilirubin 0.7  0.3 - 1.2 mg/dL   GFR calc non Af Amer 51 (*) >90 mL/min   GFR calc Af Amer 59 (*) >90 mL/min   Comment: (NOTE)     The eGFR has been calculated using the CKD EPI equation.     This calculation has not been validated in all clinical situations.     eGFR's persistently <90 mL/min signify possible Chronic Kidney     Disease.   Anion gap 12  5 - 15  CBC     Status: Abnormal   Collection Time    02/12/14  5:55 AM      Result Value Ref Range   WBC 13.6 (*) 4.0 - 10.5 K/uL   RBC 4.85  4.22 - 5.81 MIL/uL   Hemoglobin 13.9  13.0 - 17.0 g/dL   HCT 40.8  39.0 - 52.0 %   MCV 84.1  78.0 - 100.0 fL   MCH 28.7  26.0 - 34.0 pg   MCHC 34.1  30.0 - 36.0 g/dL   RDW 13.0  11.5 - 15.5 %   Platelets 165  150 - 400 K/uL  PROTIME-INR     Status: None   Collection Time    02/12/14  5:55 AM      Result Value Ref Range   Prothrombin Time 14.0  11.6 - 15.2 seconds   INR 1.08  0.00 - 1.49  LIPASE, BLOOD     Status: None   Collection Time    02/12/14  5:55 AM      Result Value Ref Range   Lipase 56  11 - 59 U/L  LIPID PANEL     Status: None   Collection Time    02/12/14  5:55 AM      Result Value Ref Range   Cholesterol 123  0 - 200 mg/dL   Triglycerides 126  <150 mg/dL   HDL 40  >39 mg/dL   Total CHOL/HDL Ratio 3.1     VLDL 25  0 - 40 mg/dL   LDL Cholesterol 58  0 - 99 mg/dL   Comment:            Total Cholesterol/HDL:CHD Risk     Coronary Heart Disease Risk Table                         Men   Women      1/2 Average  Risk   3.4   3.3      Average Risk       5.0   4.4      2 X Average Risk   9.6   7.1      3 X Average Risk  23.4   11.0                Use the calculated Patient Ratio     above and the CHD Risk Table     to determine the patient's CHD Risk.                 ATP III CLASSIFICATION (LDL):      <100     mg/dL   Optimal      100-129  mg/dL   Near or Above                        Optimal      130-159  mg/dL   Borderline      160-189  mg/dL   High      >190     mg/dL   Very High  AMYLASE     Status: None   Collection Time    02/12/14  5:55 AM      Result Value Ref Range   Amylase 48  0 - 105 U/L    US Abdomen Complete  02/12/2014   CLINICAL DATA:  Abdominal pain.  EXAM: ULTRASOUND ABDOMEN COMPLETE  COMPARISON:  CT abdomen and pelvis 02/11/2014 and abdominal ultrasound 11/05/2010  FINDINGS: Gallbladder:  No gallstones or wall thickening visualized. No sonographic Murphy sign noted.  Common bile duct:  Diameter: 4 mm  Liver:  Suboptimal visualization of the left lobe. No focal abnormality identified.  IVC:  No abnormality visualized.  Pancreas:  Not visualized due to overlying bowel gas.  Spleen:  Size and appearance within normal limits.  Right Kidney:  Length: 11.2 cm. Echogenicity within normal limits. No mass or hydronephrosis visualized.  Left Kidney:  Length: 11.8 cm. Echogenicity within normal limits. No mass or hydronephrosis visualized.  Abdominal aorta:  No aneurysm visualized.  Other findings:  None.  IMPRESSION: Limited visualization of midline structures due to bowel gas. No acute abnormality identified.   Electronically Signed   By: Logan Bores   On: 02/12/2014 08:56   Ct Abdomen Pelvis W Contrast  02/11/2014   CLINICAL DATA:  Upper abdominal pain since Wednesday. Nausea and vomiting.  EXAM: CT ABDOMEN AND PELVIS WITH CONTRAST  TECHNIQUE: Multidetector CT imaging of the abdomen and pelvis was performed using the standard protocol following bolus administration of intravenous contrast.  CONTRAST:  12m OMNIPAQUE IOHEXOL 300 MG/ML  SOLN  COMPARISON:  Ultrasound kidneys 11/05/2010  FINDINGS: Dependent changes in the lung bases. Small esophageal hiatal hernia. Postoperative changes in the mediastinum.  But there is evidence of minimal  infiltration in the fat around the head of the pancreas which, given the patient's symptoms could indicate early changes of acute focal pancreatitis. No loculated fluid collections are demonstrated in the pancreatic parenchymal enhancement is normal. Correlation with laboratory studies is recommended.  The liver, spleen, gallbladder, adrenal glands, kidneys, inferior vena cava, and retroperitoneal lymph nodes are unremarkable. Calcification of the abdominal aorta without aneurysm or dissection. Stomach, small bowel, and colon are normal for degree of distention. Scattered stool in the colon. No free air or free fluid in the abdomen. Small amount of fat in the umbilicus.  Pelvis:  Appendix is normal. Scattered diverticula in the sigmoid colon without evidence of diverticulitis. Prostate gland is not enlarged. Bladder wall is not thickened. No free or loculated pelvic fluid collections. No pelvic mass or lymphadenopathy. 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 Degenerative changes in the lumbar spine. No destructive bone lesions appreciated.  IMPRESSION: Mild infiltration suggested around the head of the pancreas, possibly indicating early acute pancreatitis. Small esophageal hiatal hernia. 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   Electronically Signed   By: Lucienne Capers M.D.   On: 02/11/2014 22:30   Nm Hepato W/eject Fract  02/12/2014   CLINICAL DATA:  Abdominal pain  EXAM: NUCLEAR MEDICINE HEPATOBILIARY IMAGING WITH GALLBLADDER EF  TECHNIQUE: Sequential images of the abdomen were obtained out to 60 minutes following intravenous administration of radiopharmaceutical. After slow intravenous infusion of Cholecystokinin, gallbladder ejection fraction was determined.  RADIOPHARMACEUTICALS:  [employer] MG-86P Choletec  COMPARISON:  None.  FINDINGS: Adequate uptake is noted throughout the liver following injection. The biliary tree is noted at 10 min with gallbladder  visualization at 15 min. Progressive filling of the gallbladder is noted.  Following injection with CCK the gallbladder ejection fraction is calculated at 6% which is well below the expected normal of greater than 30%.  The patient did experience symptoms during CCK infusion.  IMPRESSION: Normal uptake and excretion of biliary tracer.  Severely reduced ejection fraction at 6%.   Electronically Signed   By: Inez Catalina M.D.   On: 02/12/2014 14:41    Review of Systems  Respiratory: Negative for shortness of breath.   Cardiovascular: Negative for chest pain.  Gastrointestinal: Positive for nausea and abdominal pain. Negative for vomiting.  Genitourinary: Negative for dysuria and urgency.  All other systems reviewed and are negative.  Blood pressure 120/74, pulse 68, temperature 98.8 F (37.1 C), temperature source Oral, resp. rate 18, height 5' 10" (1.778 m), weight 203 lb 4.2 oz (92.2 kg), SpO2 94.00%. Physical Exam  Constitutional: He is oriented to person, place, and time. He appears well-developed and well-nourished. No distress.  HENT:  Head: Normocephalic and atraumatic.  Right Ear: External ear normal.  Left Ear: External ear normal.  Nose: Nose normal.  Mouth/Throat: Oropharynx is clear and moist. No oropharyngeal exudate.  Eyes: Conjunctivae are normal. Pupils are equal, round, and reactive to light. Right eye exhibits no discharge. Left eye exhibits no discharge. No scleral icterus.  Neck: Normal range of motion. No tracheal deviation present. No thyromegaly present.  Cardiovascular: Normal rate, regular rhythm, normal heart sounds and intact distal pulses.   No murmur heard. Respiratory: Effort normal and breath sounds normal. No respiratory distress. He has no wheezes.  GI: Soft. Bowel sounds are normal. There is no tenderness. There is no rebound and no guarding.  Minimal if any abdominal tenderness  Musculoskeletal: Normal range of motion.  Lymphadenopathy:    He has no  cervical adenopathy.  Neurological: He is alert and oriented to person, place, and time.  Skin: Skin is warm and dry. No rash noted. No erythema.  Psychiatric: His behavior is normal. Judgment normal.    Assessment/Plan: Biliary dyskinesia with chronic cholecystitis  This does not typically present with fever and elevated white blood count. Despite his normal amylase and lipase, given the CAT scan findings, I do suspect that he may have had a small amount of pancreatitis. He is improving. He will need eventual laparoscopic cholecystectomy. We will follow his labs in the morning and then make a decision whether  to proceed with surgery. I do not think he needs an upper endoscopy preoperatively but I believe this at the discretion of my partners.  Yanci Bachtell A 02/12/2014, 8:02 PM

## 2014-02-12 NOTE — Consult Note (Signed)
Consultation  Referring Provider: [facility]  Primary Care Physician:  [name], MD Primary Gastroenterologist:  Silvano Rusk, MD     Reason for Consultation:  Abdominal pain          HPI:   Dustin Ashley is a 73 y.o. male who presented to ED last evening with abdominal pain / distention and vomiting. Low grade fevers at home.  Takes a baby daily aspirin but no other NSAIDS. Amylase and  Lipase and LFTs normal but WBC 15K. Contrast CTscan of abdomen/pelvis reveals mild infiltration suggested around the head of the pancreas, possibly indicating early acute pancreatitis.   Now ok with pain medicine only.  HIDA scan shows EF 6% and CCK injection duplicated pain.   Past Medical History  Diagnosis Date  . HOH (hard of hearing)     has a hearing aid L, sees audiology rountinely  . Allergic rhinitis   . CAD (coronary artery disease)   . GERD (gastroesophageal reflux disease)   . Hyperlipemia   . Hypertension   . Hyperglycemia     A1C 5.8 04-2010  . Pain, joint, multiple sites     uses valium, occ uses for insomnia. uses for shoulder  pain  . Chronic renal insufficiency, stage II (mild)     Cr ~ 1.4, u/s 4-12 normal kidneys  . 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 Personal history of colonic polyps     Past Surgical History  Procedure Laterality Date  . Coronary artery bypass graft  1995  . Colonoscopy  multiple    Family History  Problem Relation Age of Onset  . Hypertension Father   . Heart attack      2 brothers, CABG  . Cancer Neg Hx     colon, prostate  . Diabetes Neg Hx     History  Substance Use Topics  . Smoking status: Former Smoker    Quit date: 03/13/1976  . Smokeless tobacco: Never Used  . Alcohol Use: No    Prior to Admission medications   Medication Sig Start Date End Date Taking? Authorizing Provider  amLODipine (NORVASC) 10 MG tablet Take 10 mg by mouth daily.   Yes Historical Provider, MD  aspirin 81 MG tablet Take 81 mg by mouth  daily.     Yes Historical Provider, MD  atenolol (TENORMIN) 50 MG tablet Take 75 mg by mouth daily.   Yes Historical Provider, MD  atorvastatin (LIPITOR) 20 MG tablet Take 20 mg by mouth daily.    Yes Historical Provider, MD  benazepril (LOTENSIN) 20 MG tablet Take 20 mg by mouth 2 (two) times daily.   Yes Historical Provider, MD  diazepam (VALIUM) 10 MG tablet Take 10 mg by mouth every 12 (twelve) hours as needed for anxiety.   Yes Historical Provider, MD  esomeprazole (NEXIUM) 40 MG capsule Take 1 capsule (40 mg total) by mouth daily as needed. 06/08/13  Yes Colon Branch, MD  fexofenadine (ALLEGRA) 180 MG tablet Take 180 mg by mouth daily.     Yes Historical Provider, MD  niacin (NIASPAN) 1000 MG CR tablet Take 1 tablet (1,000 mg total) by mouth at bedtime. 04/30/12  Yes Colon Branch, MD  sildenafil (VIAGRA) 100 MG tablet Take 50-100 mg by mouth daily as needed for erectile dysfunction.   Yes Historical Provider, MD    Current Facility-Administered Medications  Medication Dose Route Frequency Provider Last Rate Last Dose  . 0.9 % NaCl with KCl  20 mEq/ L  infusion   Intravenous Continuous Thurnell Lose, MD 75 mL/hr at 02/12/14 1024    . amLODipine (NORVASC) tablet 10 mg  10 mg Oral Daily Phillips Grout, MD   10 mg at 02/12/14 1024  . atenolol (TENORMIN) tablet 75 mg  75 mg Oral Daily Phillips Grout, MD   75 mg at 02/12/14 1024  . atorvastatin (LIPITOR) tablet 20 mg  20 mg Oral Daily Phillips Grout, MD   20 mg at 02/12/14 1024  . [START ON 02/13/2014] benazepril (LOTENSIN) tablet 10 mg  10 mg Oral Daily Thurnell Lose, MD      . diazepam (VALIUM) tablet 10 mg  10 mg Oral Q12H PRN Phillips Grout, MD      . heparin injection 5,000 Units  5,000 Units Subcutaneous 3 times per day Thurnell Lose, MD      . hydrALAZINE (APRESOLINE) injection 10 mg  10 mg Intravenous Q6H PRN Thurnell Lose, MD      . HYDROmorphone (DILAUDID) injection 1 mg  1 mg Intravenous Q4H PRN Phillips Grout, MD   1 mg at  02/12/14 0617  . ondansetron (ZOFRAN) tablet 4 mg  4 mg Oral Q6H PRN Phillips Grout, MD       Or  . ondansetron (ZOFRAN) injection 4 mg  4 mg Intravenous Q6H PRN Phillips Grout, MD      . pantoprazole (PROTONIX) EC tablet 40 mg  40 mg Oral BID Thurnell Lose, MD   40 mg at 02/12/14 1023    Allergies as of 02/11/2014 - Review Complete 02/11/2014  Allergen Reaction Noted  . Hydrocodone-acetaminophen Other (See Comments) 03/03/2007  . Tramadol hcl Other (See Comments) 03/03/2007     Review of Systems:    All systems reviewed and negative except where noted in HPI.  Gen: Denies any fever, chills, sweats, anorexia, fatigue, weakness, malaise, weight loss, and sleep disorder CV: Denies chest pain, angina, palpitations, syncope, orthopnea, PND, peripheral edema, and claudication. Resp: Denies dyspnea at rest, dyspnea with exercise, cough, sputum, wheezing, coughing up blood, and pleurisy. GI: Denies vomiting blood, jaundice, and fecal incontinence.   Denies dysphagia or odynophagia. GU : Denies urinary burning, blood in urine, urinary frequency, urinary hesitancy, nocturnal urination, and urinary incontinence. MS: Denies joint pain, limitation of movement, and swelling, stiffness, low back pain, extremity pain. Denies muscle weakness, cramps, atrophy.  Derm: Denies rash, itching, dry skin, hives, moles, warts, or unhealing ulcers.  Psych: Denies depression, anxiety, memory loss, suicidal ideation, hallucinations, paranoia, and confusion. Heme: Denies bruising, bleeding, and enlarged lymph nodes. Neuro:  Denies any headaches, dizziness, paresthesias. Endo:  Denies any problems with DM, thyroid, adrenal function.    Physical Exam:  Vital signs in last 24 hours: Temp:  [98.3 F (36.8 C)-99.9 F (37.7 C)] 98.8 F (37.1 C) (08/01 0419) Pulse Rate:  [57-73] 68 (08/01 0419) Resp:  [12-20] 18 (08/01 0419) BP: (114-183)/(46-74) 120/74 mmHg (08/01 1024) SpO2:  [89 %-98 %] 94 % (08/01  0419) Weight:  [203 lb 4.2 oz (92.2 kg)] 203 lb 4.2 oz (92.2 kg) (08/01 0419) Last BM Date: 02/11/14 General:   Pleasant white male in NAD Head:  Normocephalic and atraumatic. Eyes:   No icterus.   Conjunctiva pink. Lungs:  Respirations even and unlabored. Lungs clear to auscultation bilaterally.   No wheezes, crackles, or rhonchi.  Heart:  Regular rate and rhythm;  murmur heard. Abdomen:  Soft, nondistended, nontender. Normal bowel  sounds. No appreciable masses or hepatomegaly.  Extremities:  Tace LE Neurologic:  Alert and  oriented x4 Psych:  Alert and cooperative. Normal affect.  LAB RESULTS:  Recent Labs  02/11/14 1851 02/12/14 0555  WBC 15.1* 13.6*  HGB 15.3 13.9  HCT 43.1 40.8  PLT 145* 165   BMET  Recent Labs  02/11/14 1851 02/12/14 0555  NA 133* 132*  K 4.0 4.4  CL 95* 98  CO2 24 22  GLUCOSE 119* 95  BUN 18 19  CREATININE 1.23 1.34  CALCIUM 9.0 8.7   LFT  Recent Labs  02/12/14 0555  PROT 6.7  ALBUMIN 3.2*  AST 21  ALT 9  ALKPHOS 97  BILITOT 0.7   PT/INR  Recent Labs  02/12/14 0555  LABPROT 14.0  INR 1.08   Lab Results  Component Value Date   LIPASE 56 02/12/2014    STUDIES: US Abdomen Complete  02/12/2014   CLINICAL DATA:  Abdominal pain.  EXAM: ULTRASOUND ABDOMEN COMPLETE  COMPARISON:  CT abdomen and pelvis 02/11/2014 and abdominal ultrasound 11/05/2010  FINDINGS: Gallbladder:  No gallstones or wall thickening visualized. No sonographic Murphy sign noted.  Common bile duct:  Diameter: 4 mm  Liver:  Suboptimal visualization of the left lobe. No focal abnormality identified.  IVC:  No abnormality visualized.  Pancreas:  Not visualized due to overlying bowel gas.  Spleen:  Size and appearance within normal limits.  Right Kidney:  Length: 11.2 cm. Echogenicity within normal limits. No mass or hydronephrosis visualized.  Left Kidney:  Length: 11.8 cm. Echogenicity within normal limits. No mass or hydronephrosis visualized.  Abdominal aorta:  No  aneurysm visualized.  Other findings:  None.  IMPRESSION: Limited visualization of midline structures due to bowel gas. No acute abnormality identified.   Electronically Signed   By: Logan Bores   On: 02/12/2014 08:56   Ct Abdomen Pelvis W Contrast  02/11/2014   CLINICAL DATA:  Upper abdominal pain since Wednesday. Nausea and vomiting.  EXAM: CT ABDOMEN AND PELVIS WITH CONTRAST  TECHNIQUE: Multidetector CT imaging of the abdomen and pelvis was performed using the standard protocol following bolus administration of intravenous contrast.  CONTRAST:  121mL OMNIPAQUE IOHEXOL 300 MG/ML  SOLN  COMPARISON:  Ultrasound kidneys 11/05/2010  FINDINGS: Dependent changes in the lung bases. Small esophageal hiatal hernia. Postoperative changes in the mediastinum.  But there is evidence of minimal infiltration in the fat around the head of the pancreas which, given the patient's symptoms could indicate early changes of acute focal pancreatitis. No loculated fluid collections are demonstrated in the pancreatic parenchymal enhancement is normal. Correlation with laboratory studies is recommended.  The liver, spleen, gallbladder, adrenal glands, kidneys, inferior vena cava, and retroperitoneal lymph nodes are unremarkable. Calcification of the abdominal aorta without aneurysm or dissection. Stomach, small bowel, and colon are normal for degree of distention. Scattered stool in the colon. No free air or free fluid in the abdomen. Small amount of fat in the umbilicus.  Pelvis: Appendix is normal. Scattered diverticula in the sigmoid colon without evidence of diverticulitis. Prostate gland is not enlarged. Bladder wall is not thickened. No free or loculated pelvic fluid collections. No pelvic mass or lymphadenopathy. 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 Degenerative changes in the lumbar spine. No destructive bone lesions appreciated.  IMPRESSION: Mild infiltration suggested around the head of the  pancreas, possibly indicating early acute pancreatitis. Small esophageal hiatal hernia. 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   Electronically Signed  By: Lucienne Capers M.D.   On: 02/11/2014 22:30    CLINICAL DATA: Abdominal pain  EXAM:  NUCLEAR MEDICINE HEPATOBILIARY IMAGING WITH GALLBLADDER EF  TECHNIQUE:  Sequential images of the abdomen were obtained out to 60 minutes  following intravenous administration of radiopharmaceutical. After  slow intravenous infusion of Cholecystokinin, gallbladder ejection  fraction was determined.  RADIOPHARMACEUTICALS: [employer] [place] DJ-57S Choletec  COMPARISON: None.  FINDINGS:  Adequate uptake is noted throughout the liver following injection.  The biliary tree is noted at 10 min with gallbladder visualization  at 15 min. Progressive filling of the gallbladder is noted.  Following injection with CCK the gallbladder ejection fraction is  calculated at 6% which is well below the expected normal of greater  than 30%.  The patient did experience symptoms during CCK infusion.  IMPRESSION:  Normal uptake and excretion of biliary tracer.  Severely reduced ejection fraction at 6%.  Electronically Signed  By: Inez Catalina M.D.  On: 02/12/2014 14:41  PREVIOUS ENDOSCOPIES:            colonoscopy for polyp surveillance Dec 2010.  ENDOSCOPIC IMPRESSION:  1) Normal colon  2) Internal hemorrhoids in the rectum  3) Excellent prep  4) Prior villous adenomas in 2004, one > 2 cm  REPEAT EXAM: In 5 year(s) for routine colonoscopy for polyp  surveillance.     Impression / Plan:   RUQ and epigastric pain with gallbladder dyskinesia, ? Chronic cholecystitis   I have called [facility] consult to evaluate.   Thanks   LOS: 1 day   Gatha Mayer, MD, [facility] [phone] (pager) 02/12/2014 5:24 PM

## 2014-02-12 NOTE — Progress Notes (Signed)
Patient Demographics  Dustin Ashley, is a 73 y.o. male, DOB - 1941/02/18, DTO:[id number]  Admit date - 02/11/2014   Admitting Physician Phillips Grout, MD  Outpatient Primary MD for the patient is [name], MD  LOS - 1   Chief Complaint  Patient presents with  . Abdominal Pain        Subjective:   Dustin Ashley today has, No headache, No chest pain, minimal epigastric abdominal pain - No Nausea, No new weakness tingling or numbness, No Cough - SOB.    Assessment & Plan    1. Abdominal pain, acute, epigastric - with normal lipase, minimal pancreatic head infiltration on CT scan suggestive of possibly early pancreatitis, will check lipid panel and amylase also, no right upper quadrant tenderness, right upper quadrant ultrasound unremarkable. For now we'll treated as early acute pancreatitis, bowel rest, IV fluids, get HIDA scan to completely rule out gallbladder etiology, GI requested to evaluate. Will place him on PPI as well as gastritis could be a possibility too.    2. CAD. Status post CABG in the past, currently no acute chest issues, continue beta blocker, statin for secondary prevention.    3.HTN - stable on ACE, beta blocker, Norvasc combination. Monitor closely.    4. Dyslipidemia. On statin. Will check lipid panel.        Code Status: Full  Family Communication: Son and wife bedside  Disposition Plan: Home   Procedures CT scan abdomen pelvis, right upper quadrant ultrasound, HIDA scan ordered   Consults  GI   Medications  Scheduled Meds: . amLODipine  10 mg Oral Daily  . atenolol  75 mg Oral Daily  . atorvastatin  20 mg Oral Daily  . benazepril  20 mg Oral BID  . pantoprazole  40 mg Oral BID   Continuous Infusions: . 0.9 % NaCl with KCl 20 mEq / L     PRN  Meds:.diazepam, HYDROmorphone (DILAUDID) injection, ondansetron (ZOFRAN) IV, ondansetron  DVT Prophylaxis   Heparin - SCDs   Lab Results  Component Value Date   PLT 165 02/12/2014    Antibiotics   Anti-infectives   None          Objective:   Filed Vitals:   02/11/14 2300 02/11/14 2330 02/12/14 0105 02/12/14 0419  BP: 121/64 166/61 183/73 114/73  Pulse: 73 66 67 68  Temp:   99 F (37.2 C) 98.8 F (37.1 C)  TempSrc:   Oral Oral  Resp: 17 16 16 18   Height:   5\' 10"  (1.778 m)   Weight:   92.2 kg (203 lb 4.2 oz) 92.2 kg (203 lb 4.2 oz)  SpO2: 89% 92% 94% 94%    Wt Readings from Last 3 Encounters:  02/12/14 92.2 kg (203 lb 4.2 oz)  10/06/13 92.534 kg (204 lb)  07/06/13 91.173 kg (201 lb)     Intake/Output Summary (Last 24 hours) at 02/12/14 1004 Last data filed at 02/12/14 0128  Gross per 24 hour  Intake    240 ml  Output      0 ml  Net    240 ml     Physical Exam  Awake Alert, Oriented X 3, No new F.N deficits, Normal affect [place].AT,PERRAL Supple Neck,No JVD,  No cervical lymphadenopathy appriciated.  Symmetrical Chest wall movement, Good air movement bilaterally, CTAB RRR,No Gallops,Rubs or new Murmurs, No Parasternal Heave +ve B.Sounds, Abd Soft, No tenderness, No organomegaly appriciated, No rebound - guarding or rigidity. No Cyanosis, Clubbing or edema, No new Rash or bruise      Data Review   Micro Results No results found for this or any previous visit (from the past 240 hour(s)).  Radiology Reports US Abdomen Complete  02/12/2014   CLINICAL DATA:  Abdominal pain.  EXAM: ULTRASOUND ABDOMEN COMPLETE  COMPARISON:  CT abdomen and pelvis 02/11/2014 and abdominal ultrasound 11/05/2010  FINDINGS: Gallbladder:  No gallstones or wall thickening visualized. No sonographic Murphy sign noted.  Common bile duct:  Diameter: 4 mm  Liver:  Suboptimal visualization of the left lobe. No focal abnormality identified.  IVC:  No abnormality visualized.  Pancreas:  Not  visualized due to overlying bowel gas.  Spleen:  Size and appearance within normal limits.  Right Kidney:  Length: 11.2 cm. Echogenicity within normal limits. No mass or hydronephrosis visualized.  Left Kidney:  Length: 11.8 cm. Echogenicity within normal limits. No mass or hydronephrosis visualized.  Abdominal aorta:  No aneurysm visualized.  Other findings:  None.  IMPRESSION: Limited visualization of midline structures due to bowel gas. No acute abnormality identified.   Electronically Signed   By: Logan Bores   On: 02/12/2014 08:56   Ct Abdomen Pelvis W Contrast  02/11/2014   CLINICAL DATA:  Upper abdominal pain since Wednesday. Nausea and vomiting.  EXAM: CT ABDOMEN AND PELVIS WITH CONTRAST  TECHNIQUE: Multidetector CT imaging of the abdomen and pelvis was performed using the standard protocol following bolus administration of intravenous contrast.  CONTRAST:  1103mL OMNIPAQUE IOHEXOL 300 MG/ML  SOLN  COMPARISON:  Ultrasound kidneys 11/05/2010  FINDINGS: Dependent changes in the lung bases. Small esophageal hiatal hernia. Postoperative changes in the mediastinum.  But there is evidence of minimal infiltration in the fat around the head of the pancreas which, given the patient's symptoms could indicate early changes of acute focal pancreatitis. No loculated fluid collections are demonstrated in the pancreatic parenchymal enhancement is normal. Correlation with laboratory studies is recommended.  The liver, spleen, gallbladder, adrenal glands, kidneys, inferior vena cava, and retroperitoneal lymph nodes are unremarkable. Calcification of the abdominal aorta without aneurysm or dissection. Stomach, small bowel, and colon are normal for degree of distention. Scattered stool in the colon. No free air or free fluid in the abdomen. Small amount of fat in the umbilicus.  Pelvis: Appendix is normal. Scattered diverticula in the sigmoid colon without evidence of diverticulitis. Prostate gland is not enlarged. Bladder  wall is not thickened. No free or loculated pelvic fluid collections. No pelvic mass or lymphadenopathy. 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 Degenerative changes in the lumbar spine. No destructive bone lesions appreciated.  IMPRESSION: Mild infiltration suggested around the head of the pancreas, possibly indicating early acute pancreatitis. Small esophageal hiatal hernia. 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   Electronically Signed   By: Lucienne Capers M.D.   On: 02/11/2014 22:30    CBC  Recent Labs Lab 02/11/14 1851 02/12/14 0555  WBC 15.1* 13.6*  HGB 15.3 13.9  HCT 43.1 40.8  PLT 145* 165  MCV 84.3 84.1  MCH 29.9 28.7  MCHC 35.5 34.1  RDW 12.8 13.0  LYMPHSABS 1.8  --   MONOABS 1.6*  --   EOSABS 0.2  --   BASOSABS 0.0  --  Chemistries   Recent Labs Lab 02/11/14 1851 02/12/14 0555  NA 133* 132*  K 4.0 4.4  CL 95* 98  CO2 24 22  GLUCOSE 119* 95  BUN 18 19  CREATININE 1.23 1.34  CALCIUM 9.0 8.7  AST 22 21  ALT 11 9  ALKPHOS 102 97  BILITOT 0.8 0.7   ------------------------------------------------------------------------------------------------------------------ estimated creatinine clearance is 56 ml/min (by C-G formula based on Cr of 1.34). ------------------------------------------------------------------------------------------------------------------ No results found for this basename: HGBA1C,  in the last 72 hours ------------------------------------------------------------------------------------------------------------------ No results found for this basename: CHOL, HDL, LDLCALC, TRIG, CHOLHDL, LDLDIRECT,  in the last 72 hours ------------------------------------------------------------------------------------------------------------------ No results found for this basename: TSH, T4TOTAL, FREET3, T3FREE, THYROIDAB,  in the last 72  hours ------------------------------------------------------------------------------------------------------------------ No results found for this basename: VITAMINB12, FOLATE, FERRITIN, TIBC, IRON, RETICCTPCT,  in the last 72 hours  Coagulation profile  Recent Labs Lab 02/12/14 0555  INR 1.08    No results found for this basename: DDIMER,  in the last 72 hours  Cardiac Enzymes No results found for this basename: CK, CKMB, TROPONINI, MYOGLOBIN,  in the last 168 hours ------------------------------------------------------------------------------------------------------------------ No components found with this basename: POCBNP,      Time Spent in minutes 35   SINGH,PRASHANT K M.D on 02/12/2014 at 10:04 AM  Between 7am to 7pm - Pager - [phone]  After 7pm go to [email or web address] - password [email or web address]  And look for the night coverage person covering for me after hours  [facility] Office  [phone]   **Disclaimer: This note may have been dictated with voice recognition software. Similar sounding words can inadvertently be transcribed and this note may contain transcription errors which may not have been corrected upon publication of note.**

## 2014-02-13 DIAGNOSIS — D72829 Elevated white blood cell count, unspecified: Secondary | ICD-10-CM

## 2014-02-13 LAB — SURGICAL PCR SCREEN
MRSA, PCR: NEGATIVE
STAPHYLOCOCCUS AUREUS: NEGATIVE

## 2014-02-13 LAB — CBC
HEMATOCRIT: 37.2 % — AB (ref 39.0–52.0)
Hemoglobin: 12.5 g/dL — ABNORMAL LOW (ref 13.0–17.0)
MCH: 29.1 pg (ref 26.0–34.0)
MCHC: 33.6 g/dL (ref 30.0–36.0)
MCV: 86.7 fL (ref 78.0–100.0)
Platelets: 134 10*3/uL — ABNORMAL LOW (ref 150–400)
RBC: 4.29 MIL/uL (ref 4.22–5.81)
RDW: 13.1 % (ref 11.5–15.5)
WBC: 8.5 10*3/uL (ref 4.0–10.5)

## 2014-02-13 LAB — COMPREHENSIVE METABOLIC PANEL
ALT: 9 U/L (ref 0–53)
ANION GAP: 15 (ref 5–15)
AST: 21 U/L (ref 0–37)
Albumin: 2.6 g/dL — ABNORMAL LOW (ref 3.5–5.2)
Alkaline Phosphatase: 76 U/L (ref 39–117)
BUN: 20 mg/dL (ref 6–23)
CHLORIDE: 100 meq/L (ref 96–112)
CO2: 21 mEq/L (ref 19–32)
CREATININE: 1.31 mg/dL (ref 0.50–1.35)
Calcium: 8.2 mg/dL — ABNORMAL LOW (ref 8.4–10.5)
GFR calc Af Amer: 61 mL/min — ABNORMAL LOW (ref >90)
GFR calc non Af Amer: 52 mL/min — ABNORMAL LOW (ref >90)
Glucose, Bld: 87 mg/dL (ref 70–99)
POTASSIUM: 4.6 meq/L (ref 3.7–5.3)
Sodium: 136 mEq/L — ABNORMAL LOW (ref 137–147)
TOTAL PROTEIN: 6 g/dL (ref 6.0–8.3)
Total Bilirubin: 0.7 mg/dL (ref 0.3–1.2)

## 2014-02-13 LAB — LIPASE, BLOOD: LIPASE: 31 U/L (ref 11–59)

## 2014-02-13 NOTE — Progress Notes (Signed)
Patient Demographics  Dustin Ashley, is a 73 y.o. male, DOB - 1941/07/01, JSH:[id number]  Admit date - 02/11/2014   Admitting Physician Phillips Grout, MD  Outpatient Primary MD for the patient is [name], MD  LOS - 2   Chief Complaint  Patient presents with  . Abdominal Pain        Subjective:   Dustin Ashley today has, No headache, No chest pain, minimal epigastric abdominal pain - No Nausea, No new weakness tingling or numbness, No Cough - SOB.    Assessment & Plan    1. Abdominal pain, due to chronic acalculous cholecystitis - initial workup was inconclusive, pancreatitis ruled out by GI clinically, lipase normal, HIDA scan positive for low EF without any inflammation. Per general surgery likely chronic acalculus cholecystitis is due for laparoscopic cholecystectomy on 02/14/2014, monitor with supportive care. He feels a whole lot better today.    2. CAD. Status post CABG in the past, currently no acute chest issues, continue beta blocker, statin for secondary prevention.    3.HTN - stable on ACE, beta blocker, Norvasc combination. Monitor closely.    4. Dyslipidemia. On statin. Will check lipid panel.        Code Status: Full  Family Communication: Son and wife bedside  Disposition Plan: Home   Procedures CT scan abdomen pelvis, right upper quadrant ultrasound, HIDA     Consults  GI   Medications  Scheduled Meds: . amLODipine  10 mg Oral Daily  . atenolol  75 mg Oral Daily  . atorvastatin  20 mg Oral Daily  . benazepril  10 mg Oral Daily  . heparin subcutaneous  5,000 Units Subcutaneous 3 times per day  . pantoprazole  40 mg Oral BID   Continuous Infusions: . 0.9 % NaCl with KCl 20 mEq / L 75 mL/hr at 02/12/14 2110   PRN Meds:.acetaminophen, diazepam,  hydrALAZINE, HYDROmorphone (DILAUDID) injection, ondansetron (ZOFRAN) IV, ondansetron  DVT Prophylaxis   Heparin - SCDs   Lab Results  Component Value Date   PLT 134* 02/13/2014    Antibiotics   Anti-infectives   None          Objective:   Filed Vitals:   02/12/14 1024 02/12/14 2053 02/13/14 0203 02/13/14 0447  BP: 120/74 128/63  116/60  Pulse:  70  64  Temp:  100.2 F (37.9 C) 101.4 F (38.6 C) 98.7 F (37.1 C)  TempSrc:  Oral Oral Oral  Resp:  18  18  Height:      Weight:      SpO2:  92%  92%    Wt Readings from Last 3 Encounters:  02/12/14 92.2 kg (203 lb 4.2 oz)  10/06/13 92.534 kg (204 lb)  07/06/13 91.173 kg (201 lb)     Intake/Output Summary (Last 24 hours) at 02/13/14 0912 Last data filed at 02/13/14 0600  Gross per 24 hour  Intake   1470 ml  Output      0 ml  Net   1470 ml     Physical Exam  Awake Alert, Oriented X 3, No new F.N deficits, Normal affect [place].AT,PERRAL Supple Neck,No JVD, No cervical lymphadenopathy appriciated.  Symmetrical Chest wall movement, Good air movement bilaterally, CTAB RRR,No Gallops,Rubs or  new Murmurs, No Parasternal Heave +ve B.Sounds, Abd Soft, No tenderness, No organomegaly appriciated, No rebound - guarding or rigidity. No Cyanosis, Clubbing or edema, No new Rash or bruise      Data Review   Micro Results No results found for this or any previous visit (from the past 240 hour(s)).  Radiology Reports US Abdomen Complete  02/12/2014   CLINICAL DATA:  Abdominal pain.  EXAM: ULTRASOUND ABDOMEN COMPLETE  COMPARISON:  CT abdomen and pelvis 02/11/2014 and abdominal ultrasound 11/05/2010  FINDINGS: Gallbladder:  No gallstones or wall thickening visualized. No sonographic Murphy sign noted.  Common bile duct:  Diameter: 4 mm  Liver:  Suboptimal visualization of the left lobe. No focal abnormality identified.  IVC:  No abnormality visualized.  Pancreas:  Not visualized due to overlying bowel gas.  Spleen:  Size and  appearance within normal limits.  Right Kidney:  Length: 11.2 cm. Echogenicity within normal limits. No mass or hydronephrosis visualized.  Left Kidney:  Length: 11.8 cm. Echogenicity within normal limits. No mass or hydronephrosis visualized.  Abdominal aorta:  No aneurysm visualized.  Other findings:  None.  IMPRESSION: Limited visualization of midline structures due to bowel gas. No acute abnormality identified.   Electronically Signed   By: Logan Bores   On: 02/12/2014 08:56   Ct Abdomen Pelvis W Contrast  02/11/2014   CLINICAL DATA:  Upper abdominal pain since Wednesday. Nausea and vomiting.  EXAM: CT ABDOMEN AND PELVIS WITH CONTRAST  TECHNIQUE: Multidetector CT imaging of the abdomen and pelvis was performed using the standard protocol following bolus administration of intravenous contrast.  CONTRAST:  15mL OMNIPAQUE IOHEXOL 300 MG/ML  SOLN  COMPARISON:  Ultrasound kidneys 11/05/2010  FINDINGS: Dependent changes in the lung bases. Small esophageal hiatal hernia. Postoperative changes in the mediastinum.  But there is evidence of minimal infiltration in the fat around the head of the pancreas which, given the patient's symptoms could indicate early changes of acute focal pancreatitis. No loculated fluid collections are demonstrated in the pancreatic parenchymal enhancement is normal. Correlation with laboratory studies is recommended.  The liver, spleen, gallbladder, adrenal glands, kidneys, inferior vena cava, and retroperitoneal lymph nodes are unremarkable. Calcification of the abdominal aorta without aneurysm or dissection. Stomach, small bowel, and colon are normal for degree of distention. Scattered stool in the colon. No free air or free fluid in the abdomen. Small amount of fat in the umbilicus.  Pelvis: Appendix is normal. Scattered diverticula in the sigmoid colon without evidence of diverticulitis. Prostate gland is not enlarged. Bladder wall is not thickened. No free or loculated pelvic fluid  collections. No pelvic mass or lymphadenopathy. 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 Degenerative changes in the lumbar spine. No destructive bone lesions appreciated.  IMPRESSION: Mild infiltration suggested around the head of the pancreas, possibly indicating early acute pancreatitis. Small esophageal hiatal hernia. 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   Electronically Signed   By: Lucienne Capers M.D.   On: 02/11/2014 22:30    CBC  Recent Labs Lab 02/11/14 1851 02/12/14 0555 02/13/14 0430  WBC 15.1* 13.6* 8.5  HGB 15.3 13.9 12.5*  HCT 43.1 40.8 37.2*  PLT 145* 165 134*  MCV 84.3 84.1 86.7  MCH 29.9 28.7 29.1  MCHC 35.5 34.1 33.6  RDW 12.8 13.0 13.1  LYMPHSABS 1.8  --   --   MONOABS 1.6*  --   --   EOSABS 0.2  --   --   BASOSABS 0.0  --   --  Chemistries   Recent Labs Lab 02/11/14 1851 02/12/14 0555 02/13/14 0430  NA 133* 132* 136*  K 4.0 4.4 4.6  CL 95* 98 100  CO2 24 22 21   GLUCOSE 119* 95 87  BUN 18 19 20   CREATININE 1.23 1.34 1.31  CALCIUM 9.0 8.7 8.2*  AST 22 21 21   ALT 11 9 9   ALKPHOS 102 97 76  BILITOT 0.8 0.7 0.7   ------------------------------------------------------------------------------------------------------------------ estimated creatinine clearance is 57.3 ml/min (by C-G formula based on Cr of 1.31). ------------------------------------------------------------------------------------------------------------------ No results found for this basename: HGBA1C,  in the last 72 hours ------------------------------------------------------------------------------------------------------------------  Recent Labs  02/12/14 0555  CHOL 123  HDL 40  LDLCALC 58  TRIG 126  CHOLHDL 3.1   ------------------------------------------------------------------------------------------------------------------ No results found for this basename: TSH, T4TOTAL, FREET3, T3FREE, THYROIDAB,  in the last 72  hours ------------------------------------------------------------------------------------------------------------------ No results found for this basename: VITAMINB12, FOLATE, FERRITIN, TIBC, IRON, RETICCTPCT,  in the last 72 hours  Coagulation profile  Recent Labs Lab 02/12/14 0555  INR 1.08    No results found for this basename: DDIMER,  in the last 72 hours  Cardiac Enzymes No results found for this basename: CK, CKMB, TROPONINI, MYOGLOBIN,  in the last 168 hours ------------------------------------------------------------------------------------------------------------------ No components found with this basename: POCBNP,      Time Spent in minutes 35   Xayvion Shirah K M.D on 02/13/2014 at 9:12 AM  Between 7am to 7pm - Pager - [phone]  After 7pm go to [email or web address] - password [email or web address]  And look for the night coverage person covering for me after hours  [facility] Office  [phone]   **Disclaimer: This note may have been dictated with voice recognition software. Similar sounding words can inadvertently be transcribed and this note may contain transcription errors which may not have been corrected upon publication of note.**

## 2014-02-13 NOTE — Progress Notes (Signed)
Seen, agree with above. Pt with biliary dyskinesia and probable chronic cholecystitis. Maybe lap chole tomorrow.  OK for diet today and NPO after MN

## 2014-02-13 NOTE — Progress Notes (Signed)
[facility] Progress Note     Subjective: Pt doing better, no N/V, abdominal pain improved.  Ambulating OOB.  Doesn't have IS yet.    Objective: Vital signs in last 24 hours: Temp:  [98.7 F (37.1 C)-101.4 F (38.6 C)] 98.7 F (37.1 C) (08/02 0447) Pulse Rate:  [64-70] 64 (08/02 0447) Resp:  [18] 18 (08/02 0447) BP: (116-128)/(60-74) 116/60 mmHg (08/02 0447) SpO2:  [92 %] 92 % (08/02 0447) Last BM Date: 02/10/14  Intake/Output from previous day: 08/01 0701 - 08/02 0700 In: 1470 [I.V.:1470] Out: -  Intake/Output this shift:    PE: Gen:  Alert, NAD, pleasant Abd: Soft, ND, mild tenderness, +BS, no HSM    Lab Results:   Recent Labs  02/12/14 0555 02/13/14 0430  WBC 13.6* 8.5  HGB 13.9 12.5*  HCT 40.8 37.2*  PLT 165 134*   BMET  Recent Labs  02/12/14 0555 02/13/14 0430  NA 132* 136*  K 4.4 4.6  CL 98 100  CO2 22 21  GLUCOSE 95 87  BUN 19 20  CREATININE 1.34 1.31  CALCIUM 8.7 8.2*   PT/INR  Recent Labs  02/12/14 0555  LABPROT 14.0  INR 1.08   CMP     Component Value Date/Time   NA 136* 02/13/2014 0430   K 4.6 02/13/2014 0430   CL 100 02/13/2014 0430   CO2 21 02/13/2014 0430   GLUCOSE 87 02/13/2014 0430   GLUCOSE 125* 07/17/2006 1002   BUN 20 02/13/2014 0430   CREATININE 1.31 02/13/2014 0430   CALCIUM 8.2* 02/13/2014 0430   PROT 6.0 02/13/2014 0430   ALBUMIN 2.6* 02/13/2014 0430   AST 21 02/13/2014 0430   ALT 9 02/13/2014 0430   ALKPHOS 76 02/13/2014 0430   BILITOT 0.7 02/13/2014 0430   GFRNONAA 52* 02/13/2014 0430   GFRAA 61* 02/13/2014 0430   Lipase     Component Value Date/Time   LIPASE 31 02/13/2014 0430       Studies/Results: US Abdomen Complete  02/12/2014   CLINICAL DATA:  Abdominal pain.  EXAM: ULTRASOUND ABDOMEN COMPLETE  COMPARISON:  CT abdomen and pelvis 02/11/2014 and abdominal ultrasound 11/05/2010  FINDINGS: Gallbladder:  No gallstones or wall thickening visualized. No sonographic Murphy sign noted.  Common bile duct:  Diameter: 4 mm   Liver:  Suboptimal visualization of the left lobe. No focal abnormality identified.  IVC:  No abnormality visualized.  Pancreas:  Not visualized due to overlying bowel gas.  Spleen:  Size and appearance within normal limits.  Right Kidney:  Length: 11.2 cm. Echogenicity within normal limits. No mass or hydronephrosis visualized.  Left Kidney:  Length: 11.8 cm. Echogenicity within normal limits. No mass or hydronephrosis visualized.  Abdominal aorta:  No aneurysm visualized.  Other findings:  None.  IMPRESSION: Limited visualization of midline structures due to bowel gas. No acute abnormality identified.   Electronically Signed   By: Logan Bores   On: 02/12/2014 08:56   Ct Abdomen Pelvis W Contrast  02/11/2014   CLINICAL DATA:  Upper abdominal pain since Wednesday. Nausea and vomiting.  EXAM: CT ABDOMEN AND PELVIS WITH CONTRAST  TECHNIQUE: Multidetector CT imaging of the abdomen and pelvis was performed using the standard protocol following bolus administration of intravenous contrast.  CONTRAST:  133mL OMNIPAQUE IOHEXOL 300 MG/ML  SOLN  COMPARISON:  Ultrasound kidneys 11/05/2010  FINDINGS: Dependent changes in the lung bases. Small esophageal hiatal hernia. Postoperative changes in the mediastinum.  But there is evidence of minimal infiltration in  the fat around the head of the pancreas which, given the patient's symptoms could indicate early changes of acute focal pancreatitis. No loculated fluid collections are demonstrated in the pancreatic parenchymal enhancement is normal. Correlation with laboratory studies is recommended.  The liver, spleen, gallbladder, adrenal glands, kidneys, inferior vena cava, and retroperitoneal lymph nodes are unremarkable. Calcification of the abdominal aorta without aneurysm or dissection. Stomach, small bowel, and colon are normal for degree of distention. Scattered stool in the colon. No free air or free fluid in the abdomen. Small amount of fat in the umbilicus.  Pelvis:  Appendix is normal. Scattered diverticula in the sigmoid colon without evidence of diverticulitis. Prostate gland is not enlarged. Bladder wall is not thickened. No free or loculated pelvic fluid collections. No pelvic mass or lymphadenopathy. 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 Degenerative changes in the lumbar spine. No destructive bone lesions appreciated.  IMPRESSION: Mild infiltration suggested around the head of the pancreas, possibly indicating early acute pancreatitis. Small esophageal hiatal hernia. 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   Electronically Signed   By: Lucienne Capers M.D.   On: 02/11/2014 22:30   Nm Hepato W/eject Fract  02/12/2014   CLINICAL DATA:  Abdominal pain  EXAM: NUCLEAR MEDICINE HEPATOBILIARY IMAGING WITH GALLBLADDER EF  TECHNIQUE: Sequential images of the abdomen were obtained out to 60 minutes following intravenous administration of radiopharmaceutical. After slow intravenous infusion of Cholecystokinin, gallbladder ejection fraction was determined.  RADIOPHARMACEUTICALS:  [employer] EG-31D Choletec  COMPARISON:  None.  FINDINGS: Adequate uptake is noted throughout the liver following injection. The biliary tree is noted at 10 min with gallbladder visualization at 15 min. Progressive filling of the gallbladder is noted.  Following injection with CCK the gallbladder ejection fraction is calculated at 6% which is well below the expected normal of greater than 30%.  The patient did experience symptoms during CCK infusion.  IMPRESSION: Normal uptake and excretion of biliary tracer.  Severely reduced ejection fraction at 6%.   Electronically Signed   By: Inez Catalina M.D.   On: 02/12/2014 14:41    Anti-infectives: Anti-infectives   None       Assessment/Plan Biliary dyskinesia with chronic cholecystitis Leukocytosis - resolved  Plan: 1.  No emergent reasons for surgery today, will likely be able to have his surgery  tomorrow 2.  Ambulate and IS 3.  SCD's and heparin okay, d/c after midnight for anticipated surgery     LOS: 2 days    DORT, Shan Padgett 02/13/2014, 7:55 AM Pager: [phone]

## 2014-02-14 LAB — COMPREHENSIVE METABOLIC PANEL
ALBUMIN: 2.8 g/dL — AB (ref 3.5–5.2)
ALT: 11 U/L (ref 0–53)
AST: 19 U/L (ref 0–37)
Alkaline Phosphatase: 75 U/L (ref 39–117)
Anion gap: 13 (ref 5–15)
BUN: 15 mg/dL (ref 6–23)
CHLORIDE: 103 meq/L (ref 96–112)
CO2: 23 meq/L (ref 19–32)
Calcium: 8.4 mg/dL (ref 8.4–10.5)
Creatinine, Ser: 1.18 mg/dL (ref 0.50–1.35)
GFR calc Af Amer: 69 mL/min — ABNORMAL LOW (ref >90)
GFR calc non Af Amer: 59 mL/min — ABNORMAL LOW (ref >90)
Glucose, Bld: 96 mg/dL (ref 70–99)
POTASSIUM: 4.1 meq/L (ref 3.7–5.3)
SODIUM: 139 meq/L (ref 137–147)
Total Bilirubin: 0.7 mg/dL (ref 0.3–1.2)
Total Protein: 6.1 g/dL (ref 6.0–8.3)

## 2014-02-14 LAB — CBC
HCT: 36.9 % — ABNORMAL LOW (ref 39.0–52.0)
Hemoglobin: 12.6 g/dL — ABNORMAL LOW (ref 13.0–17.0)
MCH: 28.7 pg (ref 26.0–34.0)
MCHC: 34.1 g/dL (ref 30.0–36.0)
MCV: 84.1 fL (ref 78.0–100.0)
Platelets: 151 10*3/uL (ref 150–400)
RBC: 4.39 MIL/uL (ref 4.22–5.81)
RDW: 12.9 % (ref 11.5–15.5)
WBC: 7.5 10*3/uL (ref 4.0–10.5)

## 2014-02-14 LAB — LIPASE, BLOOD: Lipase: 27 U/L (ref 11–59)

## 2014-02-14 MED ORDER — PROPOFOL 10 MG/ML IV BOLUS
INTRAVENOUS | Status: AC
  Filled 2014-02-14: qty 20

## 2014-02-14 MED ORDER — HEPARIN SODIUM (PORCINE) 5000 UNIT/ML IJ SOLN: 5000.0000 [IU] | Freq: Three times a day (TID) | INTRAMUSCULAR | Status: DC

## 2014-02-14 MED ORDER — FENTANYL CITRATE 0.05 MG/ML IJ SOLN
INTRAMUSCULAR | Status: AC
  Filled 2014-02-14: qty 5

## 2014-02-14 MED ORDER — HEPARIN SODIUM (PORCINE) 5000 UNIT/ML IJ SOLN
5000.0000 [IU] | Freq: Three times a day (TID) | INTRAMUSCULAR | Status: DC
  Administered 2014-02-14: 5000 [IU] via SUBCUTANEOUS
  Filled 2014-02-14 (×2): qty 1

## 2014-02-14 NOTE — Progress Notes (Signed)
Pt seen and examined.  Agree with PA note.   Alert, obese, nad cta b/l Soft, nt, nd.  No edema  Cardiac ROS -   Biliary dyskinesia.   I believe the patient's symptoms are consistent with gallbladder disease.  I discussed laparoscopic cholecystectomy in detail. .  We discussed the risks and benefits of a laparoscopic cholecystectomy including, but not limited to bleeding, infection, injury to surrounding structures such as the intestine or liver, bile leak, retained gallstones, need to convert to an open procedure, prolonged diarrhea, blood clots such as  DVT, common bile duct injury, anesthesia risks, and possible need for additional procedures.  We discussed the typical post-operative recovery course. I explained that the likelihood of improvement of their symptoms is >80%.  We will plan for OR later today barring surgical emergencies. Discussed with pt and wife  Leighton Ruff. Redmond Pulling, MD, FACS General, Bariatric, & Minimally Invasive Surgery [facility], [place]

## 2014-02-14 NOTE — Progress Notes (Signed)
UR completed Anthony Tamburo K. Dylynn Ketner, RN, BSN, [place], CCM  02/14/2014 12:37 PM

## 2014-02-14 NOTE — Care Management Note (Addendum)
    Page 1 of 1   02/14/2014     4:15:05 PM CARE MANAGEMENT NOTE 02/14/2014  Patient:  CHRISTEPHER, MELCHIOR   Account Number:  [id number]  Date Initiated:  02/14/2014  Documentation initiated by:  HUTCHINSON,CRYSTAL  Subjective/Objective Assessment:   persistent epigastric pain  admit - lives with spouse.     Action/Plan:   CM to follow for disposition needs   Anticipated DC Date:  02/17/2014   Anticipated DC Plan:  HOME/SELF CARE         Choice offered to / List presented to:             Status of service:  In process, will continue to follow Medicare Important Message given?  YES (If response is "NO", the following Medicare IM given date fields will be blank) Date Medicare IM given:  02/14/2014 Medicare IM given by:  Tomi Bamberger Date Additional Medicare IM given:   Additional Medicare IM given by:    Discharge Disposition:    Per UR Regulation:  Reviewed for med. necessity/level of care/duration of stay  If discussed at Long Length of Stay Meetings, dates discussed:    Comments:  Crystal Hutchinson RN, BSN, [facility], CCM  Nurse - Case Manager, [phone]  02/14/2014 Initial IM 02/11/2014 provided by admissions Dispositon Plan:  Pending

## 2014-02-14 NOTE — Progress Notes (Signed)
Patient Demographics  Dustin Ashley, is a 73 y.o. male, DOB - 13-Oct-1940, URK:[id number]  Admit date - 02/11/2014   Admitting Physician Phillips Grout, MD  Outpatient Primary MD for the patient is [name], MD  LOS - 3   Chief Complaint  Patient presents with  . Abdominal Pain        Subjective:   Dustin Ashley today has, No headache, No chest pain, minimal epigastric abdominal pain - No Nausea, No new weakness tingling or numbness, No Cough - SOB.    Assessment & Plan    1. Abdominal pain, due to chronic acalculous cholecystitis - initial workup was inconclusive, pancreatitis ruled out by GI clinically, lipase normal, HIDA scan positive for low EF without any inflammation. Per general surgery likely chronic acalculus cholecystitis is due for laparoscopic cholecystectomy on 02/14/2014 or 02/15/2014, monitor with supportive care. He now pain-free.    2. CAD. Status post CABG in the past, currently no acute chest issues, continue beta blocker, statin for secondary prevention.    3.HTN - stable on ACE, beta blocker, Norvasc combination. Monitor closely.    4. Dyslipidemia. On statin. Will check lipid panel.        Code Status: Full  Family Communication: Son and wife bedside  Disposition Plan: Home   Procedures CT scan abdomen pelvis, right upper quadrant ultrasound, HIDA     Consults  GI   Medications  Scheduled Meds: . amLODipine  10 mg Oral Daily  . atenolol  75 mg Oral Daily  . atorvastatin  20 mg Oral Daily  . benazepril  10 mg Oral Daily  . pantoprazole  40 mg Oral BID   Continuous Infusions:   PRN Meds:.acetaminophen, diazepam, hydrALAZINE, HYDROmorphone (DILAUDID) injection, ondansetron (ZOFRAN) IV, ondansetron  DVT Prophylaxis   Heparin - SCDs   Lab  Results  Component Value Date   PLT 151 02/14/2014    Antibiotics   Anti-infectives   None          Objective:   Filed Vitals:   02/13/14 1356 02/13/14 2053 02/14/14 0544 02/14/14 0959  BP: 135/65 155/61 128/67 130/79  Pulse: 62 68 72   Temp: 98.8 F (37.1 C) 98.5 F (36.9 C) 98.8 F (37.1 C)   TempSrc: Oral Oral Oral   Resp: 16 15 18    Height:      Weight:      SpO2: 96% 96% 93%     Wt Readings from Last 3 Encounters:  02/12/14 92.2 kg (203 lb 4.2 oz)  02/12/14 92.2 kg (203 lb 4.2 oz)  10/06/13 92.534 kg (204 lb)     Intake/Output Summary (Last 24 hours) at 02/14/14 1052 Last data filed at 02/14/14 0602  Gross per 24 hour  Intake   1285 ml  Output      0 ml  Net   1285 ml     Physical Exam  Awake Alert, Oriented X 3, No new F.N deficits, Normal affect [place].AT,PERRAL Supple Neck,No JVD, No cervical lymphadenopathy appriciated.  Symmetrical Chest wall movement, Good air movement bilaterally, CTAB RRR,No Gallops,Rubs or new Murmurs, No Parasternal Heave +ve B.Sounds, Abd Soft, No tenderness, No organomegaly appriciated, No rebound - guarding or rigidity. No Cyanosis, Clubbing or edema, No new  Rash or bruise      Data Review   Micro Results Recent Results (from the past 240 hour(s))  SURGICAL PCR SCREEN     Status: None   Collection Time    02/13/14  3:13 PM      Result Value Ref Range Status   MRSA, PCR NEGATIVE  NEGATIVE Final   Staphylococcus aureus NEGATIVE  NEGATIVE Final   Comment:            The Xpert SA Assay (FDA     approved for NASAL specimens     in patients over 10 years of age),     is one component of     a comprehensive surveillance     program.  Test performance has     been validated by [employer] for patients greater     than or equal to 74 year old.     It is not intended     to diagnose infection nor to     guide or monitor treatment.    Radiology Reports US Abdomen Complete  02/12/2014   CLINICAL DATA:   Abdominal pain.  EXAM: ULTRASOUND ABDOMEN COMPLETE  COMPARISON:  CT abdomen and pelvis 02/11/2014 and abdominal ultrasound 11/05/2010  FINDINGS: Gallbladder:  No gallstones or wall thickening visualized. No sonographic Murphy sign noted.  Common bile duct:  Diameter: 4 mm  Liver:  Suboptimal visualization of the left lobe. No focal abnormality identified.  IVC:  No abnormality visualized.  Pancreas:  Not visualized due to overlying bowel gas.  Spleen:  Size and appearance within normal limits.  Right Kidney:  Length: 11.2 cm. Echogenicity within normal limits. No mass or hydronephrosis visualized.  Left Kidney:  Length: 11.8 cm. Echogenicity within normal limits. No mass or hydronephrosis visualized.  Abdominal aorta:  No aneurysm visualized.  Other findings:  None.  IMPRESSION: Limited visualization of midline structures due to bowel gas. No acute abnormality identified.   Electronically Signed   By: Logan Bores   On: 02/12/2014 08:56   Ct Abdomen Pelvis W Contrast  02/11/2014   CLINICAL DATA:  Upper abdominal pain since Wednesday. Nausea and vomiting.  EXAM: CT ABDOMEN AND PELVIS WITH CONTRAST  TECHNIQUE: Multidetector CT imaging of the abdomen and pelvis was performed using the standard protocol following bolus administration of intravenous contrast.  CONTRAST:  129mL OMNIPAQUE IOHEXOL 300 MG/ML  SOLN  COMPARISON:  Ultrasound kidneys 11/05/2010  FINDINGS: Dependent changes in the lung bases. Small esophageal hiatal hernia. Postoperative changes in the mediastinum.  But there is evidence of minimal infiltration in the fat around the head of the pancreas which, given the patient's symptoms could indicate early changes of acute focal pancreatitis. No loculated fluid collections are demonstrated in the pancreatic parenchymal enhancement is normal. Correlation with laboratory studies is recommended.  The liver, spleen, gallbladder, adrenal glands, kidneys, inferior vena cava, and retroperitoneal lymph nodes are  unremarkable. Calcification of the abdominal aorta without aneurysm or dissection. Stomach, small bowel, and colon are normal for degree of distention. Scattered stool in the colon. No free air or free fluid in the abdomen. Small amount of fat in the umbilicus.  Pelvis: Appendix is normal. Scattered diverticula in the sigmoid colon without evidence of diverticulitis. Prostate gland is not enlarged. Bladder wall is not thickened. No free or loculated pelvic fluid collections. No pelvic mass or lymphadenopathy. 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 Degenerative changes in the lumbar spine. No destructive bone  lesions appreciated.  IMPRESSION: Mild infiltration suggested around the head of the pancreas, possibly indicating early acute pancreatitis. Small esophageal hiatal hernia. 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   Electronically Signed   By: Lucienne Capers M.D.   On: 02/11/2014 22:30    CBC  Recent Labs Lab 02/11/14 1851 02/12/14 0555 02/13/14 0430 02/14/14 0640  WBC 15.1* 13.6* 8.5 7.5  HGB 15.3 13.9 12.5* 12.6*  HCT 43.1 40.8 37.2* 36.9*  PLT 145* 165 134* 151  MCV 84.3 84.1 86.7 84.1  MCH 29.9 28.7 29.1 28.7  MCHC 35.5 34.1 33.6 34.1  RDW 12.8 13.0 13.1 12.9  LYMPHSABS 1.8  --   --   --   MONOABS 1.6*  --   --   --   EOSABS 0.2  --   --   --   BASOSABS 0.0  --   --   --     Chemistries   Recent Labs Lab 02/11/14 1851 02/12/14 0555 02/13/14 0430 02/14/14 0640  NA 133* 132* 136* 139  K 4.0 4.4 4.6 4.1  CL 95* 98 100 103  CO2 24 22 21 23   GLUCOSE 119* 95 87 96  BUN 18 19 20 15   CREATININE 1.23 1.34 1.31 1.18  CALCIUM 9.0 8.7 8.2* 8.4  AST 22 21 21 19   ALT 11 9 9 11   ALKPHOS 102 97 76 75  BILITOT 0.8 0.7 0.7 0.7   ------------------------------------------------------------------------------------------------------------------ estimated creatinine clearance is 63.6 ml/min (by C-G formula based on Cr of  1.18). ------------------------------------------------------------------------------------------------------------------ No results found for this basename: HGBA1C,  in the last 72 hours ------------------------------------------------------------------------------------------------------------------  Recent Labs  02/12/14 0555  CHOL 123  HDL 40  LDLCALC 58  TRIG 126  CHOLHDL 3.1   ------------------------------------------------------------------------------------------------------------------ No results found for this basename: TSH, T4TOTAL, FREET3, T3FREE, THYROIDAB,  in the last 72 hours ------------------------------------------------------------------------------------------------------------------ No results found for this basename: VITAMINB12, FOLATE, FERRITIN, TIBC, IRON, RETICCTPCT,  in the last 72 hours  Coagulation profile  Recent Labs Lab 02/12/14 0555  INR 1.08    No results found for this basename: DDIMER,  in the last 72 hours  Cardiac Enzymes No results found for this basename: CK, CKMB, TROPONINI, MYOGLOBIN,  in the last 168 hours ------------------------------------------------------------------------------------------------------------------ No components found with this basename: POCBNP,      Time Spent in minutes 35   SINGH,PRASHANT K M.D on 02/14/2014 at 10:52 AM  Between 7am to 7pm - Pager - [phone]  After 7pm go to [email or web address] - password [email or web address]  And look for the night coverage person covering for me after hours  [facility] Office  [phone]   **Disclaimer: This note may have been dictated with voice recognition software. Similar sounding words can inadvertently be transcribed and this note may contain transcription errors which may not have been corrected upon publication of note.**

## 2014-02-14 NOTE — Progress Notes (Signed)
[facility] Progress Note     Subjective: Pt doing much better today.  No N/V, abdominal pain improved.  Ambulating well.  Wife at bedside.  Objective: Vital signs in last 24 hours: Temp:  [98.5 F (36.9 C)-98.8 F (37.1 C)] 98.8 F (37.1 C) (08/03 0544) Pulse Rate:  [62-72] 72 (08/03 0544) Resp:  [15-18] 18 (08/03 0544) BP: (122-155)/(61-67) 128/67 mmHg (08/03 0544) SpO2:  [93 %-96 %] 93 % (08/03 0544) Last BM Date: 02/10/14 (Pt states he hasn't eaten enough past few days to have a BM)  Intake/Output from previous day: 08/02 0701 - 08/03 0700 In: 1285 [I.V.:1285] Out: -  Intake/Output this shift:    PE: Gen:  Alert, NAD, pleasant Abd: Obese, soft, NT/ND, +BS, no HSM   Lab Results:   Recent Labs  02/13/14 0430 02/14/14 0640  WBC 8.5 7.5  HGB 12.5* 12.6*  HCT 37.2* 36.9*  PLT 134* 151   BMET  Recent Labs  02/13/14 0430 02/14/14 0640  NA 136* 139  K 4.6 4.1  CL 100 103  CO2 21 23  GLUCOSE 87 96  BUN 20 15  CREATININE 1.31 1.18  CALCIUM 8.2* 8.4   PT/INR  Recent Labs  02/12/14 0555  LABPROT 14.0  INR 1.08   CMP     Component Value Date/Time   NA 139 02/14/2014 0640   K 4.1 02/14/2014 0640   CL 103 02/14/2014 0640   CO2 23 02/14/2014 0640   GLUCOSE 96 02/14/2014 0640   GLUCOSE 125* 07/17/2006 1002   BUN 15 02/14/2014 0640   CREATININE 1.18 02/14/2014 0640   CALCIUM 8.4 02/14/2014 0640   PROT 6.1 02/14/2014 0640   ALBUMIN 2.8* 02/14/2014 0640   AST 19 02/14/2014 0640   ALT 11 02/14/2014 0640   ALKPHOS 75 02/14/2014 0640   BILITOT 0.7 02/14/2014 0640   GFRNONAA 59* 02/14/2014 0640   GFRAA 69* 02/14/2014 0640   Lipase     Component Value Date/Time   LIPASE 27 02/14/2014 0640       Studies/Results: US Abdomen Complete  02/12/2014   CLINICAL DATA:  Abdominal pain.  EXAM: ULTRASOUND ABDOMEN COMPLETE  COMPARISON:  CT abdomen and pelvis 02/11/2014 and abdominal ultrasound 11/05/2010  FINDINGS: Gallbladder:  No gallstones or wall thickening visualized. No  sonographic Murphy sign noted.  Common bile duct:  Diameter: 4 mm  Liver:  Suboptimal visualization of the left lobe. No focal abnormality identified.  IVC:  No abnormality visualized.  Pancreas:  Not visualized due to overlying bowel gas.  Spleen:  Size and appearance within normal limits.  Right Kidney:  Length: 11.2 cm. Echogenicity within normal limits. No mass or hydronephrosis visualized.  Left Kidney:  Length: 11.8 cm. Echogenicity within normal limits. No mass or hydronephrosis visualized.  Abdominal aorta:  No aneurysm visualized.  Other findings:  None.  IMPRESSION: Limited visualization of midline structures due to bowel gas. No acute abnormality identified.   Electronically Signed   By: Logan Bores   On: 02/12/2014 08:56   Nm Hepato W/eject Fract  02/12/2014   CLINICAL DATA:  Abdominal pain  EXAM: NUCLEAR MEDICINE HEPATOBILIARY IMAGING WITH GALLBLADDER EF  TECHNIQUE: Sequential images of the abdomen were obtained out to 60 minutes following intravenous administration of radiopharmaceutical. After slow intravenous infusion of Cholecystokinin, gallbladder ejection fraction was determined.  RADIOPHARMACEUTICALS:  [employer] AC-16S Choletec  COMPARISON:  None.  FINDINGS: Adequate uptake is noted throughout the liver following injection. The biliary tree is noted at 10  min with gallbladder visualization at 15 min. Progressive filling of the gallbladder is noted.  Following injection with CCK the gallbladder ejection fraction is calculated at 6% which is well below the expected normal of greater than 30%.  The patient did experience symptoms during CCK infusion.  IMPRESSION: Normal uptake and excretion of biliary tracer.  Severely reduced ejection fraction at 6%.   Electronically Signed   By: Inez Catalina M.D.   On: 02/12/2014 14:41    Anti-infectives: Anti-infectives   None       Assessment/Plan Biliary dyskinesia with chronic cholecystitis  Leukocytosis - resolved   Plan:  1. Needs  surgery non-emergently, hopefully we can fit him in today.  Keep NPO. 2. Ambulate and IS  3. SCD's and Heparin on hold     LOS: 3 days    DORT, Jinny Blossom 02/14/2014, 7:42 AM Pager: [phone]

## 2014-02-15 ENCOUNTER — Encounter (HOSPITAL_COMMUNITY): Payer: Medicare Other | Admitting: Certified Registered Nurse Anesthetist

## 2014-02-15 ENCOUNTER — Encounter (HOSPITAL_COMMUNITY)
Admission: EM | Disposition: A | Discharge status: Home / Self Care | Payer: Self-pay | Source: Home / Self Care | Attending: Internal Medicine

## 2014-02-15 ENCOUNTER — Inpatient Hospital Stay (HOSPITAL_COMMUNITY): Payer: Medicare Other | Admitting: Certified Registered Nurse Anesthetist

## 2014-02-15 ENCOUNTER — Inpatient Hospital Stay (HOSPITAL_COMMUNITY): Payer: Medicare Other

## 2014-02-15 ENCOUNTER — Encounter (HOSPITAL_COMMUNITY): Payer: Self-pay | Admitting: Anesthesiology

## 2014-02-15 HISTORY — PX: CHOLECYSTECTOMY: SHX55

## 2014-02-15 LAB — COMPREHENSIVE METABOLIC PANEL
ALT: 13 U/L (ref 0–53)
ANION GAP: 14 (ref 5–15)
AST: 20 U/L (ref 0–37)
Albumin: 3 g/dL — ABNORMAL LOW (ref 3.5–5.2)
Alkaline Phosphatase: 79 U/L (ref 39–117)
BUN: 14 mg/dL (ref 6–23)
CO2: 26 mEq/L (ref 19–32)
CREATININE: 1.12 mg/dL (ref 0.50–1.35)
Calcium: 8.8 mg/dL (ref 8.4–10.5)
Chloride: 101 mEq/L (ref 96–112)
GFR calc non Af Amer: 63 mL/min — ABNORMAL LOW (ref >90)
GFR, EST AFRICAN AMERICAN: 73 mL/min — AB (ref >90)
Glucose, Bld: 86 mg/dL (ref 70–99)
POTASSIUM: 4.2 meq/L (ref 3.7–5.3)
Sodium: 141 mEq/L (ref 137–147)
TOTAL PROTEIN: 6.2 g/dL (ref 6.0–8.3)
Total Bilirubin: 0.7 mg/dL (ref 0.3–1.2)

## 2014-02-15 LAB — CBC
HEMATOCRIT: 38.8 % — AB (ref 39.0–52.0)
Hemoglobin: 13.3 g/dL (ref 13.0–17.0)
MCH: 28.6 pg (ref 26.0–34.0)
MCHC: 34.3 g/dL (ref 30.0–36.0)
MCV: 83.4 fL (ref 78.0–100.0)
Platelets: 161 10*3/uL (ref 150–400)
RBC: 4.65 MIL/uL (ref 4.22–5.81)
RDW: 12.8 % (ref 11.5–15.5)
WBC: 7.2 10*3/uL (ref 4.0–10.5)

## 2014-02-15 LAB — GLUCOSE, CAPILLARY: Glucose-Capillary: 128 mg/dL — ABNORMAL HIGH (ref 70–99)

## 2014-02-15 LAB — LIPASE, BLOOD: Lipase: 29 U/L (ref 11–59)

## 2014-02-15 SURGERY — LAPAROSCOPIC CHOLECYSTECTOMY
Anesthesia: General | Site: Abdomen

## 2014-02-15 MED ORDER — SUCCINYLCHOLINE CHLORIDE 20 MG/ML IJ SOLN
INTRAMUSCULAR | Status: AC
  Filled 2014-02-15: qty 1

## 2014-02-15 MED ORDER — SODIUM CHLORIDE 0.9 % IR SOLN
Status: DC | PRN
  Administered 2014-02-15 (×2): 1000 mL

## 2014-02-15 MED ORDER — ROCURONIUM BROMIDE 100 MG/10ML IV SOLN
INTRAVENOUS | Status: DC | PRN
  Administered 2014-02-15: 50 mg via INTRAVENOUS

## 2014-02-15 MED ORDER — METRONIDAZOLE IN NACL 5-0.79 MG/ML-% IV SOLN
INTRAVENOUS | Status: DC | PRN
  Administered 2014-02-15: 500 mg via INTRAVENOUS

## 2014-02-15 MED ORDER — CEFAZOLIN SODIUM-DEXTROSE 2-3 GM-% IV SOLR
2.0000 g | Freq: Once | INTRAVENOUS | Status: AC
  Administered 2014-02-15: 2 g via INTRAVENOUS
  Filled 2014-02-15: qty 50

## 2014-02-15 MED ORDER — ONDANSETRON HCL 4 MG/2ML IJ SOLN
4.0000 mg | Freq: Once | INTRAMUSCULAR | Status: AC | PRN
  Administered 2014-02-15: 4 mg via INTRAVENOUS

## 2014-02-15 MED ORDER — FENTANYL CITRATE 0.05 MG/ML IJ SOLN
INTRAMUSCULAR | Status: DC | PRN
  Administered 2014-02-15: 150 ug via INTRAVENOUS
  Administered 2014-02-15: 50 ug via INTRAVENOUS

## 2014-02-15 MED ORDER — HEMOSTATIC AGENTS (NO CHARGE) OPTIME
TOPICAL | Status: DC | PRN
  Administered 2014-02-15: 1 via TOPICAL

## 2014-02-15 MED ORDER — MEPERIDINE HCL 25 MG/ML IJ SOLN: 6.2500 mg | INTRAMUSCULAR | Status: DC | PRN

## 2014-02-15 MED ORDER — LIDOCAINE HCL (CARDIAC) 20 MG/ML IV SOLN
INTRAVENOUS | Status: DC | PRN
Start: 2014-02-15 — End: 2014-02-15
  Administered 2014-02-15: 75 mg via INTRAVENOUS

## 2014-02-15 MED ORDER — MIDAZOLAM HCL 2 MG/2ML IJ SOLN
INTRAMUSCULAR | Status: AC
  Filled 2014-02-15: qty 2

## 2014-02-15 MED ORDER — METRONIDAZOLE IN NACL 5-0.79 MG/ML-% IV SOLN
500.0000 mg | Freq: Once | INTRAVENOUS | Status: DC
  Filled 2014-02-15: qty 100

## 2014-02-15 MED ORDER — GLYCOPYRROLATE 0.2 MG/ML IJ SOLN: INTRAMUSCULAR | Status: DC | PRN

## 2014-02-15 MED ORDER — FENTANYL CITRATE 0.05 MG/ML IJ SOLN
INTRAMUSCULAR | Status: AC
  Filled 2014-02-15: qty 5

## 2014-02-15 MED ORDER — PROPOFOL 10 MG/ML IV BOLUS
INTRAVENOUS | Status: AC
  Filled 2014-02-15: qty 20

## 2014-02-15 MED ORDER — HYDROMORPHONE HCL PF 1 MG/ML IJ SOLN
0.2500 mg | INTRAMUSCULAR | Status: DC | PRN
  Administered 2014-02-15 (×2): 0.5 mg via INTRAVENOUS

## 2014-02-15 MED ORDER — GLYCOPYRROLATE 0.2 MG/ML IJ SOLN
INTRAMUSCULAR | Status: DC | PRN
  Administered 2014-02-15: .7 mg via INTRAVENOUS

## 2014-02-15 MED ORDER — ROCURONIUM BROMIDE 50 MG/5ML IV SOLN
INTRAVENOUS | Status: AC
  Filled 2014-02-15: qty 1

## 2014-02-15 MED ORDER — HYDROMORPHONE HCL PF 1 MG/ML IJ SOLN
INTRAMUSCULAR | Status: AC
  Filled 2014-02-15: qty 1

## 2014-02-15 MED ORDER — BUPIVACAINE-EPINEPHRINE 0.25% -1:200000 IJ SOLN
INTRAMUSCULAR | Status: DC | PRN
  Administered 2014-02-15: 30 mL

## 2014-02-15 MED ORDER — LACTATED RINGERS IV SOLN
INTRAVENOUS | Status: DC
  Administered 2014-02-15 (×2): via INTRAVENOUS

## 2014-02-15 MED ORDER — GLYCOPYRROLATE 0.2 MG/ML IJ SOLN
INTRAMUSCULAR | Status: AC
Start: 2014-02-15 — End: 2014-02-15
  Filled 2014-02-15: qty 3

## 2014-02-15 MED ORDER — EPHEDRINE SULFATE 50 MG/ML IJ SOLN
INTRAMUSCULAR | Status: AC
  Filled 2014-02-15: qty 1

## 2014-02-15 MED ORDER — ONDANSETRON HCL 4 MG/2ML IJ SOLN
INTRAMUSCULAR | Status: AC
  Administered 2014-02-15: 4 mg
  Filled 2014-02-15: qty 2

## 2014-02-15 MED ORDER — LIDOCAINE HCL (CARDIAC) 20 MG/ML IV SOLN
INTRAVENOUS | Status: AC
  Filled 2014-02-15: qty 5

## 2014-02-15 MED ORDER — BUPIVACAINE-EPINEPHRINE (PF) 0.25% -1:200000 IJ SOLN
INTRAMUSCULAR | Status: AC
  Filled 2014-02-15: qty 30

## 2014-02-15 MED ORDER — ONDANSETRON HCL 4 MG/2ML IJ SOLN
INTRAMUSCULAR | Status: DC | PRN
  Administered 2014-02-15: 4 mg via INTRAVENOUS

## 2014-02-15 MED ORDER — NEOSTIGMINE METHYLSULFATE 10 MG/10ML IV SOLN
INTRAVENOUS | Status: DC | PRN
  Administered 2014-02-15: 4 mg via INTRAVENOUS

## 2014-02-15 MED ORDER — OXYCODONE HCL 5 MG PO TABS: 5.0000 mg | ORAL_TABLET | Freq: Once | ORAL | Status: DC | PRN

## 2014-02-15 MED ORDER — METRONIDAZOLE IN NACL 5-0.79 MG/ML-% IV SOLN
500.0000 mg | Freq: Once | INTRAVENOUS | Status: AC
  Administered 2014-02-15: 500 mg via INTRAVENOUS
  Filled 2014-02-15: qty 100

## 2014-02-15 MED ORDER — IOHEXOL 300 MG/ML  SOLN
INTRAMUSCULAR | Status: DC | PRN
  Administered 2014-02-15: 50 mL

## 2014-02-15 MED ORDER — OXYCODONE HCL 5 MG/5ML PO SOLN: 5.0000 mg | Freq: Once | ORAL | Status: DC | PRN

## 2014-02-15 MED ORDER — MIDAZOLAM HCL 5 MG/5ML IJ SOLN
INTRAMUSCULAR | Status: DC | PRN
  Administered 2014-02-15: 2 mg via INTRAVENOUS

## 2014-02-15 MED ORDER — 0.9 % SODIUM CHLORIDE (POUR BTL) OPTIME
TOPICAL | Status: DC | PRN
  Administered 2014-02-15: 1000 mL

## 2014-02-15 MED ORDER — ONDANSETRON HCL 4 MG/2ML IJ SOLN
INTRAMUSCULAR | Status: AC
  Filled 2014-02-15: qty 2

## 2014-02-15 MED ORDER — LACTATED RINGERS IV SOLN
INTRAVENOUS | Status: DC | PRN
  Administered 2014-02-15: 09:00:00 via INTRAVENOUS

## 2014-02-15 MED ORDER — PROPOFOL 10 MG/ML IV BOLUS
INTRAVENOUS | Status: DC | PRN
  Administered 2014-02-15: 110 mg via INTRAVENOUS

## 2014-02-15 MED ORDER — HYDROMORPHONE HCL PF 1 MG/ML IJ SOLN
1.0000 mg | INTRAMUSCULAR | Status: DC | PRN
  Administered 2014-02-15: 1 mg via INTRAVENOUS
  Administered 2014-02-15: 2 mg via INTRAVENOUS
  Administered 2014-02-15 – 2014-02-16 (×4): 1 mg via INTRAVENOUS
  Filled 2014-02-15 (×2): qty 1
  Filled 2014-02-15: qty 2
  Filled 2014-02-15 (×3): qty 1

## 2014-02-15 MED ORDER — HEPARIN SODIUM (PORCINE) 5000 UNIT/ML IJ SOLN
5000.0000 [IU] | Freq: Three times a day (TID) | INTRAMUSCULAR | Status: DC
  Administered 2014-02-15 – 2014-02-16 (×3): 5000 [IU] via SUBCUTANEOUS
  Filled 2014-02-15 (×3): qty 1

## 2014-02-15 MED ORDER — NEOSTIGMINE METHYLSULFATE 10 MG/10ML IV SOLN: INTRAVENOUS | Status: DC | PRN

## 2014-02-15 MED ORDER — CEFAZOLIN SODIUM-DEXTROSE 2-3 GM-% IV SOLR
INTRAVENOUS | Status: DC | PRN
  Administered 2014-02-15: 2 g via INTRAVENOUS

## 2014-02-15 SURGICAL SUPPLY — 45 items
APPLIER CLIP 5 13 M/L LIGAMAX5 (MISCELLANEOUS) ×2
BANDAGE ADH SHEER 1  50/CT (GAUZE/BANDAGES/DRESSINGS) ×6 IMPLANT
BENZOIN TINCTURE PRP APPL 2/3 (GAUZE/BANDAGES/DRESSINGS) ×2 IMPLANT
BLADE SURG ROTATE 9660 (MISCELLANEOUS) ×2 IMPLANT
CANISTER SUCTION 2500CC (MISCELLANEOUS) ×2 IMPLANT
CHLORAPREP W/TINT 26ML (MISCELLANEOUS) ×2 IMPLANT
CLIP APPLIE 5 13 M/L LIGAMAX5 (MISCELLANEOUS) ×1 IMPLANT
COVER MAYO STAND STRL (DRAPES) ×2 IMPLANT
COVER SURGICAL LIGHT HANDLE (MISCELLANEOUS) ×2 IMPLANT
DRAPE C-ARM 42X72 X-RAY (DRAPES) ×2 IMPLANT
DRAPE UTILITY 15X26 W/TAPE STR (DRAPE) ×4 IMPLANT
DRSG TEGADERM 4X4.75 (GAUZE/BANDAGES/DRESSINGS) ×2 IMPLANT
ELECT REM PT RETURN 9FT ADLT (ELECTROSURGICAL) ×2
ELECTRODE REM PT RTRN 9FT ADLT (ELECTROSURGICAL) ×1 IMPLANT
GAUZE SPONGE 2X2 8PLY STRL LF (GAUZE/BANDAGES/DRESSINGS) ×1 IMPLANT
GLOVE BIOGEL M STRL SZ7.5 (GLOVE) ×2 IMPLANT
GLOVE BIOGEL PI IND STRL 7.0 (GLOVE) ×2 IMPLANT
GLOVE BIOGEL PI IND STRL 8 (GLOVE) ×1 IMPLANT
GLOVE BIOGEL PI INDICATOR 7.0 (GLOVE) ×2
GLOVE BIOGEL PI INDICATOR 8 (GLOVE) ×1
GLOVE SURG SS PI 7.0 STRL IVOR (GLOVE) ×2 IMPLANT
GOWN STRL REUS W/ TWL LRG LVL3 (GOWN DISPOSABLE) ×3 IMPLANT
GOWN STRL REUS W/ TWL XL LVL3 (GOWN DISPOSABLE) ×1 IMPLANT
GOWN STRL REUS W/TWL LRG LVL3 (GOWN DISPOSABLE) ×3
GOWN STRL REUS W/TWL XL LVL3 (GOWN DISPOSABLE) ×1
HEMOSTAT SNOW SURGICEL 2X4 (HEMOSTASIS) ×2 IMPLANT
KIT BASIN OR (CUSTOM PROCEDURE TRAY) ×2 IMPLANT
KIT ROOM TURNOVER OR (KITS) ×2 IMPLANT
NS IRRIG 1000ML POUR BTL (IV SOLUTION) ×2 IMPLANT
PAD ARMBOARD 7.5X6 YLW CONV (MISCELLANEOUS) ×4 IMPLANT
POUCH SPECIMEN RETRIEVAL 10MM (ENDOMECHANICALS) ×2 IMPLANT
SCISSORS LAP 5X35 DISP (ENDOMECHANICALS) ×2 IMPLANT
SET CHOLANGIOGRAPH 5 50 .035 (SET/KITS/TRAYS/PACK) ×2 IMPLANT
SET IRRIG TUBING LAPAROSCOPIC (IRRIGATION / IRRIGATOR) ×2 IMPLANT
SLEEVE ENDOPATH XCEL 5M (ENDOMECHANICALS) ×4 IMPLANT
SPECIMEN JAR SMALL (MISCELLANEOUS) ×2 IMPLANT
SPONGE GAUZE 2X2 STER 10/PKG (GAUZE/BANDAGES/DRESSINGS) ×1
SUT MNCRL AB 4-0 PS2 18 (SUTURE) ×2 IMPLANT
SUT VICRYL 0 UR6 27IN ABS (SUTURE) ×2 IMPLANT
TAPE CLOTH SOFT 2X10 (GAUZE/BANDAGES/DRESSINGS) ×2 IMPLANT
TOWEL OR 17X24 6PK STRL BLUE (TOWEL DISPOSABLE) ×2 IMPLANT
TOWEL OR 17X26 10 PK STRL BLUE (TOWEL DISPOSABLE) ×2 IMPLANT
TRAY LAPAROSCOPIC (CUSTOM PROCEDURE TRAY) ×2 IMPLANT
TROCAR XCEL BLUNT TIP 100MML (ENDOMECHANICALS) ×2 IMPLANT
TROCAR XCEL NON-BLD 5MMX100MML (ENDOMECHANICALS) ×2 IMPLANT

## 2014-02-15 NOTE — H&P (View-Only) (Signed)
Pt seen and examined.  Agree with PA note.   Alert, obese, nad cta b/l Soft, nt, nd.  No edema  Cardiac ROS -   Biliary dyskinesia.   I believe the patient's symptoms are consistent with gallbladder disease.  I discussed laparoscopic cholecystectomy in detail. .  We discussed the risks and benefits of a laparoscopic cholecystectomy including, but not limited to bleeding, infection, injury to surrounding structures such as the intestine or liver, bile leak, retained gallstones, need to convert to an open procedure, prolonged diarrhea, blood clots such as  DVT, common bile duct injury, anesthesia risks, and possible need for additional procedures.  We discussed the typical post-operative recovery course. I explained that the likelihood of improvement of their symptoms is >80%.  We will plan for OR later today barring surgical emergencies. Discussed with pt and wife  Leighton Ruff. Redmond Pulling, MD, FACS General, Bariatric, & Minimally Invasive Surgery [facility], [place]

## 2014-02-15 NOTE — H&P (View-Only) (Signed)
[facility] Progress Note     Subjective: Pt doing much better today.  No N/V, abdominal pain improved.  Ambulating well.  Wife at bedside.  Objective: Vital signs in last 24 hours: Temp:  [98.5 F (36.9 C)-98.8 F (37.1 C)] 98.8 F (37.1 C) (08/03 0544) Pulse Rate:  [62-72] 72 (08/03 0544) Resp:  [15-18] 18 (08/03 0544) BP: (122-155)/(61-67) 128/67 mmHg (08/03 0544) SpO2:  [93 %-96 %] 93 % (08/03 0544) Last BM Date: 02/10/14 (Pt states he hasn't eaten enough past few days to have a BM)  Intake/Output from previous day: 08/02 0701 - 08/03 0700 In: 1285 [I.V.:1285] Out: -  Intake/Output this shift:    PE: Gen:  Alert, NAD, pleasant Abd: Obese, soft, NT/ND, +BS, no HSM   Lab Results:   Recent Labs  02/13/14 0430 02/14/14 0640  WBC 8.5 7.5  HGB 12.5* 12.6*  HCT 37.2* 36.9*  PLT 134* 151   BMET  Recent Labs  02/13/14 0430 02/14/14 0640  NA 136* 139  K 4.6 4.1  CL 100 103  CO2 21 23  GLUCOSE 87 96  BUN 20 15  CREATININE 1.31 1.18  CALCIUM 8.2* 8.4   PT/INR  Recent Labs  02/12/14 0555  LABPROT 14.0  INR 1.08   CMP     Component Value Date/Time   NA 139 02/14/2014 0640   K 4.1 02/14/2014 0640   CL 103 02/14/2014 0640   CO2 23 02/14/2014 0640   GLUCOSE 96 02/14/2014 0640   GLUCOSE 125* 07/17/2006 1002   BUN 15 02/14/2014 0640   CREATININE 1.18 02/14/2014 0640   CALCIUM 8.4 02/14/2014 0640   PROT 6.1 02/14/2014 0640   ALBUMIN 2.8* 02/14/2014 0640   AST 19 02/14/2014 0640   ALT 11 02/14/2014 0640   ALKPHOS 75 02/14/2014 0640   BILITOT 0.7 02/14/2014 0640   GFRNONAA 59* 02/14/2014 0640   GFRAA 69* 02/14/2014 0640   Lipase     Component Value Date/Time   LIPASE 27 02/14/2014 0640       Studies/Results: US Abdomen Complete  02/12/2014   CLINICAL DATA:  Abdominal pain.  EXAM: ULTRASOUND ABDOMEN COMPLETE  COMPARISON:  CT abdomen and pelvis 02/11/2014 and abdominal ultrasound 11/05/2010  FINDINGS: Gallbladder:  No gallstones or wall thickening visualized. No  sonographic Murphy sign noted.  Common bile duct:  Diameter: 4 mm  Liver:  Suboptimal visualization of the left lobe. No focal abnormality identified.  IVC:  No abnormality visualized.  Pancreas:  Not visualized due to overlying bowel gas.  Spleen:  Size and appearance within normal limits.  Right Kidney:  Length: 11.2 cm. Echogenicity within normal limits. No mass or hydronephrosis visualized.  Left Kidney:  Length: 11.8 cm. Echogenicity within normal limits. No mass or hydronephrosis visualized.  Abdominal aorta:  No aneurysm visualized.  Other findings:  None.  IMPRESSION: Limited visualization of midline structures due to bowel gas. No acute abnormality identified.   Electronically Signed   By: Logan Bores   On: 02/12/2014 08:56   Nm Hepato W/eject Fract  02/12/2014   CLINICAL DATA:  Abdominal pain  EXAM: NUCLEAR MEDICINE HEPATOBILIARY IMAGING WITH GALLBLADDER EF  TECHNIQUE: Sequential images of the abdomen were obtained out to 60 minutes following intravenous administration of radiopharmaceutical. After slow intravenous infusion of Cholecystokinin, gallbladder ejection fraction was determined.  RADIOPHARMACEUTICALS:  [employer] JK-09F Choletec  COMPARISON:  None.  FINDINGS: Adequate uptake is noted throughout the liver following injection. The biliary tree is noted at 10  min with gallbladder visualization at 15 min. Progressive filling of the gallbladder is noted.  Following injection with CCK the gallbladder ejection fraction is calculated at 6% which is well below the expected normal of greater than 30%.  The patient did experience symptoms during CCK infusion.  IMPRESSION: Normal uptake and excretion of biliary tracer.  Severely reduced ejection fraction at 6%.   Electronically Signed   By: Inez Catalina M.D.   On: 02/12/2014 14:41    Anti-infectives: Anti-infectives   None       Assessment/Plan Biliary dyskinesia with chronic cholecystitis  Leukocytosis - resolved   Plan:  1. Needs  surgery non-emergently, hopefully we can fit him in today.  Keep NPO. 2. Ambulate and IS  3. SCD's and Heparin on hold     LOS: 3 days    DORT, Jinny Blossom 02/14/2014, 7:42 AM Pager: [phone]

## 2014-02-15 NOTE — Anesthesia Postprocedure Evaluation (Signed)
Anesthesia Post Note  Patient: Dustin Ashley  Procedure(s) Performed: Procedure(s) (LRB): LAPAROSCOPIC CHOLECYSTECTOMY with IOC (N/A)  Anesthesia type: general  Patient location: PACU  Post pain: Pain level controlled  Post assessment: Patient's Cardiovascular Status Stable  Last Vitals:  Filed Vitals:   02/15/14 1245  BP: 132/53  Pulse: 57  Temp:   Resp: 17    Post vital signs: Reviewed and stable  Level of consciousness: sedated  Complications: No apparent anesthesia complications

## 2014-02-15 NOTE — Anesthesia Preprocedure Evaluation (Signed)
Anesthesia Evaluation  Patient identified by MRN, date of birth, ID band Patient awake    Reviewed: Allergy & Precautions, H&P , NPO status , Patient's Chart, lab work & pertinent test results  Airway Mallampati: I TM Distance: >3 FB Neck ROM: Full    Dental   Pulmonary former smoker,          Cardiovascular hypertension, Pt. on medications + CAD     Neuro/Psych    GI/Hepatic GERD-  Controlled and Medicated,  Endo/Other    Renal/GU CRFRenal disease     Musculoskeletal   Abdominal   Peds  Hematology   Anesthesia Other Findings   Reproductive/Obstetrics                           Anesthesia Physical Anesthesia Plan  ASA: III  Anesthesia Plan: General   Post-op Pain Management:    Induction: Intravenous  Airway Management Planned: Oral ETT  Additional Equipment:   Intra-op Plan:   Post-operative Plan: Extubation in OR  Informed Consent: I have reviewed the patients History and Physical, chart, labs and discussed the procedure including the risks, benefits and alternatives for the proposed anesthesia with the patient or authorized representative who has indicated his/her understanding and acceptance.     Plan Discussed with: CRNA and Surgeon  Anesthesia Plan Comments:         Anesthesia Quick Evaluation

## 2014-02-15 NOTE — Interval H&P Note (Signed)
History and Physical Interval Note:  02/15/2014 9:41 AM  Dustin Ashley  has presented today for surgery, with the diagnosis of Biliary Dyskinesia  The various methods of treatment have been discussed with the patient and family. After consideration of risks, benefits and other options for treatment, the patient has consented to  Procedure(s): LAPAROSCOPIC CHOLECYSTECTOMY (N/A) as a surgical intervention .  The patient's history has been reviewed, patient examined, no change in status, stable for surgery.  I have reviewed the patient's chart and labs.  Questions were answered to the patient's satisfaction.    Leighton Ruff. Redmond Pulling, MD, [place], Bariatric, & Minimally Invasive Surgery [facility], [place]   [facility]

## 2014-02-15 NOTE — Anesthesia Procedure Notes (Signed)
Procedure Name: Intubation Date/Time: 02/15/2014 10:02 AM Performed by: Eligha Bridegroom Pre-anesthesia Checklist: Patient identified, Patient being monitored, Emergency Drugs available, Timeout performed and Suction available Patient Re-evaluated:Patient Re-evaluated prior to inductionOxygen Delivery Method: Circle system utilized Intubation Type: IV induction Ventilation: Mask ventilation without difficulty and Oral airway inserted - appropriate to patient size Laryngoscope Size: Mac and 4 Grade View: Grade II Tube type: Oral Tube size: 7.0 mm Number of attempts: 1 Airway Equipment and Method: Stylet Placement Confirmation: ETT inserted through vocal cords under direct vision,  breath sounds checked- equal and bilateral and positive ETCO2 Secured at: 22 cm Tube secured with: Tape Dental Injury: Teeth and Oropharynx as per pre-operative assessment

## 2014-02-15 NOTE — Transfer of Care (Signed)
Immediate Anesthesia Transfer of Care Note  Patient: Dustin Ashley  Procedure(s) Performed: Procedure(s): LAPAROSCOPIC CHOLECYSTECTOMY with IOC (N/A)  Patient Location: PACU  Anesthesia Type:General  Level of Consciousness: awake, alert  and oriented  Airway & Oxygen Therapy: Patient Spontanous Breathing and Patient connected to nasal cannula oxygen  Post-op Assessment: Report given to PACU RN and Post -op Vital signs reviewed and stable  Post vital signs: Reviewed and stable  Complications: No apparent anesthesia complications

## 2014-02-15 NOTE — Progress Notes (Signed)
Patient Demographics  Dustin Ashley, is a 73 y.o. male, DOB - 1941-06-26, MEQ:[id number]  Admit date - 02/11/2014   Admitting Physician Phillips Grout, MD  Outpatient Primary MD for the patient is Dustin November, MD  LOS - 4   Chief Complaint  Patient presents with  . Abdominal Pain      Brief summary.  73 year old Caucasian male with history of CAD status post CABG several years ago was stable from cardiac standpoint, presented to the hospital with abdominal pain nausea vomiting due to chronic gallbladder akinesis/dyskinesis, no evidence of active infection, clinically no pancreatitis. Seen by GI and general surgery. Due for laparoscopic cholecystectomy on 02/15/2014. Currently stable. For surgery if does well this afternoon postop can be discharged next day.   Subjective:   [name] today has, No headache, No chest pain, minimal epigastric abdominal pain - No Nausea, No new weakness tingling or numbness, No Cough - SOB.    Assessment & Plan    1. Abdominal pain, due to chronic gallbladder dyskinesis - initial workup was inconclusive, pancreatitis ruled out by GI clinically, lipase normal, HIDA scan positive for low EF without any inflammation. 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 He is now pain-free.    2. CAD. Status post CABG in the past, currently no acute chest issues, continue beta blocker, statin for secondary prevention.    3.HTN - stable on ACE, beta blocker, Norvasc combination. Monitor closely. PRN  IV medications on board.    4. Dyslipidemia. On statin. stable check lipid panel.        Code Status: Full  Family Communication: Son and wife  bedside  Disposition Plan: Home   Procedures CT scan abdomen pelvis, right upper quadrant ultrasound, HIDA , laparoscopic cholecystectomy scheduled for 02/15/2014 afternoon   Consults  GI, CCS   Medications  Scheduled Meds: . [MAR HOLD] amLODipine  10 mg Oral Daily  . [facility] HOLD] atenolol  75 mg Oral Daily  . [facility] HOLD] atorvastatin  20 mg Oral Daily  . [MAR HOLD] benazepril  10 mg Oral Daily  . [facility] HOLD] heparin subcutaneous  5,000 Units Subcutaneous 3 times per day  . metronidazole  500 mg Intravenous Once  . [facility] HOLD] pantoprazole  40 mg Oral BID   Continuous Infusions: . lactated ringers 50 mL/hr at 02/15/14 0940   PRN Meds:.0.9 % irrigation (POUR BTL), [MAR HOLD] acetaminophen, [MAR HOLD] diazepam, [MAR HOLD] hydrALAZINE, [MAR HOLD]  HYDROmorphone (DILAUDID) injection, iohexol, [MAR HOLD] ondansetron (ZOFRAN) IV, [MAR HOLD] ondansetron, sodium chloride irrigation  DVT Prophylaxis   Heparin - SCDs   Lab Results  Component Value Date   PLT 161 02/15/2014    Antibiotics   Anti-infectives   Start     Dose/Rate Route Frequency Ordered Stop   02/15/14 1015  metroNIDAZOLE (FLAGYL) IVPB 500 mg     500 mg 100 mL/hr over 60 Minutes Intravenous  Once 02/15/14 1014            Objective:   Filed Vitals:   02/14/14 1309 02/14/14 2043 02/15/14 0447 02/15/14 0900  BP: 123/74 144/71 139/69 169/65  Pulse: 64 64 63 67  Temp: 98.1 F (36.7  C) 97.5 F (36.4 C) 98.3 F (36.8 C)   TempSrc: Oral Oral Oral   Resp: 16 16 16    Height:      Weight:      SpO2: 94% 95% 94%     Wt Readings from Last 3 Encounters:  02/12/14 92.2 kg (203 lb 4.2 oz)  02/12/14 92.2 kg (203 lb 4.2 oz)  10/06/13 92.534 kg (204 lb)    No intake or output data in the 24 hours ending 02/15/14 1038   Physical Exam  Awake Alert, Oriented X 3, No new F.N deficits, Normal affect [place].AT,PERRAL Supple Neck,No JVD, No cervical lymphadenopathy appriciated.  Symmetrical Chest wall movement, Good air  movement bilaterally, CTAB RRR,No Gallops,Rubs or new Murmurs, No Parasternal Heave +ve B.Sounds, Abd Soft, No tenderness, No organomegaly appriciated, No rebound - guarding or rigidity. No Cyanosis, Clubbing or edema, No new Rash or bruise      Data Review   Micro Results Recent Results (from the past 240 hour(s))  SURGICAL PCR SCREEN     Status: None   Collection Time    02/13/14  3:13 PM      Result Value Ref Range Status   MRSA, PCR NEGATIVE  NEGATIVE Final   Staphylococcus aureus NEGATIVE  NEGATIVE Final   Comment:            The Xpert SA Assay (FDA     approved for NASAL specimens     in patients over 5 years of age),     is one component of     a comprehensive surveillance     program.  Test performance has     been validated by [employer] for patients greater     than or equal to 43 year old.     It is not intended     to diagnose infection nor to     guide or monitor treatment.    Radiology Reports US Abdomen Complete  02/12/2014   CLINICAL DATA:  Abdominal pain.  EXAM: ULTRASOUND ABDOMEN COMPLETE  COMPARISON:  CT abdomen and pelvis 02/11/2014 and abdominal ultrasound 11/05/2010  FINDINGS: Gallbladder:  No gallstones or wall thickening visualized. No sonographic Murphy sign noted.  Common bile duct:  Diameter: 4 mm  Liver:  Suboptimal visualization of the left lobe. No focal abnormality identified.  IVC:  No abnormality visualized.  Pancreas:  Not visualized due to overlying bowel gas.  Spleen:  Size and appearance within normal limits.  Right Kidney:  Length: 11.2 cm. Echogenicity within normal limits. No mass or hydronephrosis visualized.  Left Kidney:  Length: 11.8 cm. Echogenicity within normal limits. No mass or hydronephrosis visualized.  Abdominal aorta:  No aneurysm visualized.  Other findings:  None.  IMPRESSION: Limited visualization of midline structures due to bowel gas. No acute abnormality identified.   Electronically Signed   By: Logan Bores   On:  02/12/2014 08:56   Ct Abdomen Pelvis W Contrast  02/11/2014   CLINICAL DATA:  Upper abdominal pain since Wednesday. Nausea and vomiting.  EXAM: CT ABDOMEN AND PELVIS WITH CONTRAST  TECHNIQUE: Multidetector CT imaging of the abdomen and pelvis was performed using the standard protocol following bolus administration of intravenous contrast.  CONTRAST:  19mL OMNIPAQUE IOHEXOL 300 MG/ML  SOLN  COMPARISON:  Ultrasound kidneys 11/05/2010  FINDINGS: Dependent changes in the lung bases. Small esophageal hiatal hernia. Postoperative changes in the mediastinum.  But there is evidence of minimal infiltration in the fat around  the head of the pancreas which, given the patient's symptoms could indicate early changes of acute focal pancreatitis. No loculated fluid collections are demonstrated in the pancreatic parenchymal enhancement is normal. Correlation with laboratory studies is recommended.  The liver, spleen, gallbladder, adrenal glands, kidneys, inferior vena cava, and retroperitoneal lymph nodes are unremarkable. Calcification of the abdominal aorta without aneurysm or dissection. Stomach, small bowel, and colon are normal for degree of distention. Scattered stool in the colon. No free air or free fluid in the abdomen. Small amount of fat in the umbilicus.  Pelvis: Appendix is normal. Scattered diverticula in the sigmoid colon without evidence of diverticulitis. Prostate gland is not enlarged. Bladder wall is not thickened. No free or loculated pelvic fluid collections. No pelvic mass or lymphadenopathy. 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 Degenerative changes in the lumbar spine. No destructive bone lesions appreciated.  IMPRESSION: Mild infiltration suggested around the head of the pancreas, possibly indicating early acute pancreatitis. Small esophageal hiatal hernia. 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   Electronically Signed   By: Lucienne Capers M.D.   On: 02/11/2014  22:30    CBC  Recent Labs Lab 02/11/14 1851 02/12/14 0555 02/13/14 0430 02/14/14 0640 02/15/14 0525  WBC 15.1* 13.6* 8.5 7.5 7.2  HGB 15.3 13.9 12.5* 12.6* 13.3  HCT 43.1 40.8 37.2* 36.9* 38.8*  PLT 145* 165 134* 151 161  MCV 84.3 84.1 86.7 84.1 83.4  MCH 29.9 28.7 29.1 28.7 28.6  MCHC 35.5 34.1 33.6 34.1 34.3  RDW 12.8 13.0 13.1 12.9 12.8  LYMPHSABS 1.8  --   --   --   --   MONOABS 1.6*  --   --   --   --   EOSABS 0.2  --   --   --   --   BASOSABS 0.0  --   --   --   --     Chemistries   Recent Labs Lab 02/11/14 1851 02/12/14 0555 02/13/14 0430 02/14/14 0640 02/15/14 0525  NA 133* 132* 136* 139 141  K 4.0 4.4 4.6 4.1 4.2  CL 95* 98 100 103 101  CO2 24 22 21 23 26   GLUCOSE 119* 95 87 96 86  BUN 18 19 20 15 14   CREATININE 1.23 1.34 1.31 1.18 1.12  CALCIUM 9.0 8.7 8.2* 8.4 8.8  AST 22 21 21 19 20   ALT 11 9 9 11 13   ALKPHOS 102 97 76 75 79  BILITOT 0.8 0.7 0.7 0.7 0.7   ------------------------------------------------------------------------------------------------------------------ estimated creatinine clearance is 67 ml/min (by C-G formula based on Cr of 1.12). ------------------------------------------------------------------------------------------------------------------ No results found for this basename: HGBA1C,  in the last 72 hours ------------------------------------------------------------------------------------------------------------------ No results found for this basename: CHOL, HDL, LDLCALC, TRIG, CHOLHDL, LDLDIRECT,  in the last 72 hours ------------------------------------------------------------------------------------------------------------------ No results found for this basename: TSH, T4TOTAL, FREET3, T3FREE, THYROIDAB,  in the last 72 hours ------------------------------------------------------------------------------------------------------------------ No results found for this basename: VITAMINB12, FOLATE, FERRITIN, TIBC, IRON,  RETICCTPCT,  in the last 72 hours  Coagulation profile  Recent Labs Lab 02/12/14 0555  INR 1.08    No results found for this basename: DDIMER,  in the last 72 hours  Cardiac Enzymes No results found for this basename: CK, CKMB, TROPONINI, MYOGLOBIN,  in the last 168 hours ------------------------------------------------------------------------------------------------------------------ No components found with this basename: POCBNP,      Time Spent in minutes 35   Pattie Flaharty K M.D on 02/15/2014 at 10:37 AM  Between 7am to 7pm -  Pager - [phone]  After 7pm go to [email or web address] - password [email or web address]  And look for the night coverage person covering for me after hours  [facility] Office  [phone]   **Disclaimer: This note may have been dictated with voice recognition software. Similar sounding words can inadvertently be transcribed and this note may contain transcription errors which may not have been corrected upon publication of note.**

## 2014-02-15 NOTE — Op Note (Signed)
Dustin Ashley [id number] 1941/04/28 02/15/2014  Laparoscopic Cholecystectomy with IOC Procedure Note  Indications: This patient presents with symptomatic gallbladder disease (biliary dyskinesia) and will undergo laparoscopic cholecystectomy. See notes  Pre-operative Diagnosis: biliary dyskinesia  Post-operative Diagnosis: chronic cholecystitis  Surgeon: Gayland Curry   Assistants: Nedra Hai, MD  Anesthesia: General endotracheal anesthesia  ASA Class: 3  Procedure Details  The patient was seen again in the Holding Room. The risks, benefits, complications, treatment options, and expected outcomes were discussed with the patient. The possibilities of reaction to medication, pulmonary aspiration, perforation of viscus, bleeding, recurrent infection, finding a normal gallbladder, the need for additional procedures, failure to diagnose a condition, the possible need to convert to an open procedure, and creating a complication requiring transfusion or operation were discussed with the patient. The likelihood of improving the patient's symptoms with return to their baseline status is good.  The patient and/or family concurred with the proposed plan, giving informed consent. The site of surgery properly noted. The patient was taken to Operating Room, identified as Dustin Ashley and the procedure verified as Laparoscopic Cholecystectomy with Intraoperative Cholangiogram. A Time Out was held and the above information confirmed. Antibiotic prophylaxis was administered.   Prior to the induction of general anesthesia, antibiotic prophylaxis was administered. General endotracheal anesthesia was then administered and tolerated well. After the induction, the abdomen was prepped with Chloraprep and draped in the sterile fashion. The patient was positioned in the supine position.  Local anesthetic agent was injected into the skin near the umbilicus and an incision made. We dissected down to the abdominal  fascia with blunt dissection.  The fascia was incised vertically and we entered the peritoneal cavity bluntly.  A pursestring suture of 0-Vicryl was placed around the fascial opening.  The Hasson cannula was inserted and secured with the stay suture.  Pneumoperitoneum was then created with CO2 and tolerated well without any adverse changes in the patient's vital signs. An 5-mm port was placed in the subxiphoid position.  Two 5-mm ports were placed in the right upper quadrant. All skin incisions were infiltrated with a local anesthetic agent before making the incision and placing the trocars.   We positioned the patient in reverse Trendelenburg, tilted slightly to the patient's left.  The gallbladder was identified, the fundus grasped and retracted cephalad. Adhesions were lysed bluntly and with the electrocautery where indicated, taking care not to injure any adjacent organs or viscus. The infundibulum was grasped and retracted laterally, exposing the peritoneum overlying the triangle of Calot. This was then divided and exposed in a blunt fashion. A critical view of the cystic duct and cystic artery was obtained.  The cystic duct was clearly identified and bluntly dissected circumferentially. The cystic duct was ligated with a clip distally.   An incision was made in the cystic duct and the [facility] cholangiogram catheter introduced. The catheter was secured using a clip. A cholangiogram was then obtained which showed good visualization of the distal and proximal biliary tree with no sign of filling defects or obstruction.  Contrast flowed easily into the duodenum. The catheter was then removed.   The cystic duct was then ligated with clips and divided. The cystic artery which had been identified and dissected free wasligated with clips and divided as well.   The gallbladder was dissected from the liver bed in retrograde fashion with the electrocautery. The gallbladder was removed and placed in an Endocatch sac.   The gallbladder and Endocatch sac were then removed  through the umbilical port site. The liver bed was irrigated and inspected. Hemostasis was achieved with the electrocautery. There was 1 small spot on the right edge that required cautery three times, hemostasis was achieved but I elected to place a piece of surgical SNow in the gallbladder fossa. Copious irrigation was utilized and was repeatedly aspirated until clear.  The pursestring suture was used to close the umbilical fascia.    We again inspected the right upper quadrant for hemostasis.  The umbilical closure was inspected and there was no air leak and nothing trapped within the closure. Because of his obesity, i elected to place an additional interruputed suture of 0 vicryl at the umbilical fascia. Pneumoperitoneum was released as we removed the trocars.  4-0 Monocryl was used to close the skin.   Benzoin, steri-strips, and clean dressings were applied. The patient was then extubated and brought to the recovery room in stable condition. Instrument, sponge, and needle counts were correct at closure and at the conclusion of the case.   Findings: Chronic Cholecystitis; +SNOW  Estimated Blood Loss: Minimal         Drains: none         Specimens: Gallbladder           Complications: None; patient tolerated the procedure well.         Disposition: PACU - hemodynamically stable.         Condition: stable  Leighton Ruff. Redmond Pulling, MD, FACS General, Bariatric, & Minimally Invasive Surgery [facility], [place]

## 2014-02-16 LAB — COMPREHENSIVE METABOLIC PANEL
ALT: 44 U/L (ref 0–53)
ANION GAP: 13 (ref 5–15)
AST: 58 U/L — AB (ref 0–37)
Albumin: 2.7 g/dL — ABNORMAL LOW (ref 3.5–5.2)
Alkaline Phosphatase: 116 U/L (ref 39–117)
BILIRUBIN TOTAL: 0.7 mg/dL (ref 0.3–1.2)
BUN: 16 mg/dL (ref 6–23)
CHLORIDE: 100 meq/L (ref 96–112)
CO2: 26 meq/L (ref 19–32)
CREATININE: 1.14 mg/dL (ref 0.50–1.35)
Calcium: 8.5 mg/dL (ref 8.4–10.5)
GFR calc Af Amer: 72 mL/min — ABNORMAL LOW (ref >90)
GFR calc non Af Amer: 62 mL/min — ABNORMAL LOW (ref >90)
Glucose, Bld: 135 mg/dL — ABNORMAL HIGH (ref 70–99)
Potassium: 4.1 mEq/L (ref 3.7–5.3)
Sodium: 139 mEq/L (ref 137–147)
Total Protein: 6.1 g/dL (ref 6.0–8.3)

## 2014-02-16 LAB — URINALYSIS, ROUTINE W REFLEX MICROSCOPIC
Glucose, UA: NEGATIVE mg/dL
Hgb urine dipstick: NEGATIVE
KETONES UR: NEGATIVE mg/dL
Nitrite: NEGATIVE
PROTEIN: NEGATIVE mg/dL
Specific Gravity, Urine: 1.021 (ref 1.005–1.030)
Urobilinogen, UA: 1 mg/dL (ref 0.0–1.0)
pH: 5.5 (ref 5.0–8.0)

## 2014-02-16 LAB — URINE MICROSCOPIC-ADD ON

## 2014-02-16 LAB — CBC
HEMATOCRIT: 38.8 % — AB (ref 39.0–52.0)
Hemoglobin: 13.1 g/dL (ref 13.0–17.0)
MCH: 29.1 pg (ref 26.0–34.0)
MCHC: 33.8 g/dL (ref 30.0–36.0)
MCV: 86.2 fL (ref 78.0–100.0)
Platelets: 163 10*3/uL (ref 150–400)
RBC: 4.5 MIL/uL (ref 4.22–5.81)
RDW: 13.2 % (ref 11.5–15.5)
WBC: 9.4 10*3/uL (ref 4.0–10.5)

## 2014-02-16 MED ORDER — KETOROLAC TROMETHAMINE 10 MG PO TABS
10.0000 mg | ORAL_TABLET | Freq: Three times a day (TID) | ORAL | Status: DC | PRN
Start: 2014-02-16 — End: 2014-03-06

## 2014-02-16 MED ORDER — KETOROLAC TROMETHAMINE 10 MG PO TABS
10.0000 mg | ORAL_TABLET | Freq: Three times a day (TID) | ORAL | Status: DC | PRN
  Filled 2014-02-16: qty 1

## 2014-02-16 NOTE — Progress Notes (Signed)
Patient was discharged home by MD order; discharged instructions  review and give to patient and his wife with care notes and prescriptions; IV DIC; skin with 4 abdomen incisions - dressing intact, clean, dry ; patient will be escorted to the car by nurse tech via wheelchair.

## 2014-02-16 NOTE — Progress Notes (Signed)
Patient didn't urinate after 7AM; at bladder scan - 179cc/urine. Dr. Karleen Hampshire notified. No new order at this time.

## 2014-02-16 NOTE — Progress Notes (Signed)
[facility] Progress Note  1 Day Post-Op  Subjective: Pt doing well c/o incisional pain and R shoulder pain (likely from gas).  Ambulating through halls.  Having trouble with urinary retention.  No N/V, tolerated fulls for breakfast.    Objective: Vital signs in last 24 hours: Temp:  [97.6 F (36.4 C)-99.9 F (37.7 C)] 99.9 F (37.7 C) (08/05 0519) Pulse Rate:  [55-75] 75 (08/05 0519) Resp:  [10-21] 16 (08/05 0519) BP: (103-157)/(46-71) 103/63 mmHg (08/05 0519) SpO2:  [81 %-95 %] 91 % (08/05 0519) Last BM Date: 02/15/14  Intake/Output from previous day: 08/04 0701 - 08/05 0700 In: 1110 [P.O.:260; I.V.:850] Out: 900 [Urine:900] Intake/Output this shift:    PE: Gen:  Alert, NAD, pleasant Abd: Soft, mild distension, mild tenderness over incisions sites, +BS, no HSM, incisions C/D/I   Lab Results:   Recent Labs  02/15/14 0525 02/16/14 0555  WBC 7.2 9.4  HGB 13.3 13.1  HCT 38.8* 38.8*  PLT 161 163   BMET  Recent Labs  02/15/14 0525 02/16/14 0555  NA 141 139  K 4.2 4.1  CL 101 100  CO2 26 26  GLUCOSE 86 135*  BUN 14 16  CREATININE 1.12 1.14  CALCIUM 8.8 8.5   PT/INR No results found for this basename: LABPROT, INR,  in the last 72 hours CMP     Component Value Date/Time   NA 139 02/16/2014 0555   K 4.1 02/16/2014 0555   CL 100 02/16/2014 0555   CO2 26 02/16/2014 0555   GLUCOSE 135* 02/16/2014 0555   GLUCOSE 125* 07/17/2006 1002   BUN 16 02/16/2014 0555   CREATININE 1.14 02/16/2014 0555   CALCIUM 8.5 02/16/2014 0555   PROT 6.1 02/16/2014 0555   ALBUMIN 2.7* 02/16/2014 0555   AST 58* 02/16/2014 0555   ALT 44 02/16/2014 0555   ALKPHOS 116 02/16/2014 0555   BILITOT 0.7 02/16/2014 0555   GFRNONAA 62* 02/16/2014 0555   GFRAA 72* 02/16/2014 0555   Lipase     Component Value Date/Time   LIPASE 29 02/15/2014 0525       Studies/Results: Dg Cholangiogram Operative  02/15/2014   CLINICAL DATA:  Abnormal biliary ejection fraction  EXAM: INTRAOPERATIVE CHOLANGIOGRAM   TECHNIQUE: Cholangiographic images from the C-arm fluoroscopic device were submitted for interpretation post-operatively. Please see the procedural report for the amount of contrast and the fluoroscopy time utilized.  COMPARISON:  Nuclear medicine HIDA scan 02/12/2014; prior abdominal ultrasound 02/12/2014  FINDINGS: Intraoperative staining and spot images demonstrate cannulation of the cystic duct remnant and opacification of the intra and extrahepatic biliary tree. No evidence of stenosis, stricture, filling defect or dilatation. Contrast material passes freely through the ampulla and into the duodenum. A nasogastric tube is present.  IMPRESSION: Negative intraoperative cholangiogram.   Electronically Signed   By: Jacqulynn Cadet M.D.   On: 02/15/2014 11:24    Anti-infectives: Anti-infectives   Start     Dose/Rate Route Frequency Ordered Stop   02/15/14 1900  metroNIDAZOLE (FLAGYL) IVPB 500 mg     500 mg 100 mL/hr over 60 Minutes Intravenous  Once 02/15/14 1344 02/15/14 1940   02/15/14 1800  ceFAZolin (ANCEF) IVPB 2 g/50 mL premix     2 g 100 mL/hr over 30 Minutes Intravenous  Once 02/15/14 1344 02/15/14 1809   02/15/14 1015  metroNIDAZOLE (FLAGYL) IVPB 500 mg  Status:  Discontinued     500 mg 100 mL/hr over 60 Minutes Intravenous  Once 02/15/14 1014 02/15/14 1338  Assessment/Plan Biliary dyskinesia with chronic cholecystitis POD #1 s/p lap chole with negative IOC Urinary retention   Plan:  1.  Low fat diet 2.  Ambulate and IS  3.  SCD's and resume Heparin 4.  Once he is able to urinate on his own, pain well controlled, and tolerated low fat diet he can be discharged home.  Likely today unless he can not urinate 5.   F/u appt in epic    LOS: 5 days    DORT, Kacelyn Rowzee 02/16/2014, 10:03 AM Pager: [phone]

## 2014-02-16 NOTE — Discharge Instructions (Signed)
Your appointment is at 2:45pm, please arrive at least 30 min before your appointment to complete your check in paperwork.  If you are unable to arrive 30 min prior to your appointment time we may have to cancel or reschedule you.  LAPAROSCOPIC SURGERY: POST OP INSTRUCTIONS  1. DIET: Follow a light bland diet the first 24 hours after arrival home, such as soup, liquids, crackers, etc. Be sure to include lots of fluids daily. Avoid fast food or heavy meals as your are more likely to get nauseated. Eat a low fat the next few days after surgery.  2. Take your usually prescribed home medications unless otherwise directed. 3. PAIN CONTROL:  a. Pain is best controlled by a usual combination of three different methods TOGETHER:  i. Ice/Heat ii. Over the counter pain medication iii. Prescription pain medication b. Most patients will experience some swelling and bruising around the incisions. Ice packs or heating pads (30-60 minutes up to 6 times a day) will help. Use ice for the first few days to help decrease swelling and bruising, then switch to heat to help relax tight/sore spots and speed recovery. Some people prefer to use ice alone, heat alone, alternating between ice & heat. Experiment to what works for you. Swelling and bruising can take several weeks to resolve.  c. It is helpful to take an over-the-counter pain medication regularly for the first few weeks. Choose one of the following that works best for you:  i. Naproxen (Aleve, etc) Two 220mg  tabs twice a day ii. Ibuprofen (Advil, etc) Three 200mg  tabs four times a day (every meal & bedtime) iii. Acetaminophen (Tylenol, etc) 500-650mg  four times a day (every meal & bedtime) d. A prescription for pain medication (such as oxycodone, hydrocodone, etc) should be given to you upon discharge. Take your pain medication as prescribed.  i. If you are having problems/concerns with the prescription medicine (does not control pain, nausea, vomiting, rash,  itching, etc), please call us [phone] to see if we need to switch you to a different pain medicine that will work better for you and/or control your side effect better. ii. If you need a refill on your pain medication, please contact your pharmacy. They will contact our office to request authorization. Prescriptions will not be filled after 5 pm or on week-ends. 4. Avoid getting constipated. Between the surgery and the pain medications, it is common to experience some constipation. Increasing fluid intake and taking a fiber supplement (such as Metamucil, Citrucel, FiberCon, MiraLax, etc) 1-2 times a day regularly will usually help prevent this problem from occurring. A mild laxative (prune juice, Milk of Magnesia, MiraLax, etc) should be taken according to package directions if there are no bowel movements after 48 hours.  5. Watch out for diarrhea. If you have many loose bowel movements, simplify your diet to bland foods & liquids for a few days. Stop any stool softeners and decrease your fiber supplement. Switching to mild anti-diarrheal medications (Kayopectate, Pepto Bismol) can help. If this worsens or does not improve, please call us. 6. Wash / shower every day. You may shower over the dressings as they are waterproof. Continue to shower over incision(s) after the dressing is off. 7. Remove your waterproof bandages 5 days after surgery. You may leave the incision open to air. You may replace a dressing/Band-Aid to cover the incision for comfort if you wish.  8. ACTIVITIES as tolerated:  a. You may resume regular (light) daily activities beginning the next day--such as  daily self-care, walking, climbing stairs--gradually increasing activities as tolerated. If you can walk 30 minutes without difficulty, it is safe to try more intense activity such as jogging, treadmill, bicycling, low-impact aerobics, swimming, etc. b. Save the most intensive and strenuous activity for last such as sit-ups, heavy  lifting, contact sports, etc Refrain from any heavy lifting or straining until you are off narcotics for pain control.  c. DO NOT PUSH THROUGH PAIN. Let pain be your guide: If it hurts to do something, don't do it. Pain is your body warning you to avoid that activity for another week until the pain goes down. d. You may drive when you are no longer taking prescription pain medication, you can comfortably wear a seatbelt, and you can safely maneuver your car and apply brakes. e. Dennis Bast may have sexual intercourse when it is comfortable.  9. FOLLOW UP in our office  a. Please call [facility] at [phone] to set up an appointment to see your surgeon in the office for a follow-up appointment approximately 2-3 weeks after your surgery. b. Make sure that you call for this appointment the day you arrive home to insure a convenient appointment time.      10. IF YOU HAVE DISABILITY OR FAMILY LEAVE FORMS, BRING THEM TO THE               OFFICE FOR PROCESSING.   WHEN TO CALL us [phone]:  1. Poor pain control 2. Reactions / problems with new medications (rash/itching, nausea, etc)  3. Fever over 101.5 F (38.5 C) 4. Inability to urinate 5. Nausea and/or vomiting 6. Worsening swelling or bruising 7. Continued bleeding from incision. 8. Increased pain, redness, or drainage from the incision  The clinic staff is available to answer your questions during regular business hours (8:30am-5pm). Please dont hesitate to call and ask to speak to one of our nurses for clinical concerns.  If you have a medical emergency, go to the nearest emergency room or call 911.  A surgeon from [facility] is always on call at the [facility], [place], [place], [place], [place] [address] ?  MAIN: [phone] ? TOLL FREE: [phone] ?  FAX [phone]) [phone]  [email or web address]

## 2014-02-16 NOTE — Evaluation (Signed)
Physical Therapy Evaluation Patient Details Name: Dustin Ashley MRN: [id number] DOB: October 09, 1940 Today's Date: 02/16/2014   History of Present Illness  Pt is a 73 y.o. male s/p lap chole on 02/15/14.   Clinical Impression  Pt adm due to above. Pt at baseline for mobility standpoint at this time. Was able to ambulate flight of steps with use of handrail. No focal weaknesses or deficits noted during evaluation. Encouraged pt to ambulate unit as tolerated prior to D/C to maintain strength. Will sign off at this time. Thanks.     Follow Up Recommendations No PT follow up    Equipment Recommendations  None recommended by PT    Recommendations for Other Services       Precautions / Restrictions Precautions Precautions: None Restrictions Weight Bearing Restrictions: No      Mobility  Bed Mobility Overal bed mobility: Independent                Transfers Overall transfer level: Modified independent Equipment used: None             General transfer comment: incr time required due to pain   Ambulation/Gait Ambulation/Gait assistance: Supervision;Modified independent (Device/Increase time) Ambulation Distance (Feet): 220 Feet Assistive device: None Gait Pattern/deviations: Step-through pattern;Decreased stride length Gait velocity: decr due to pain Gait velocity interpretation: Below normal speed for age/gender General Gait Details: progressing from supervision for safety to mod I; pt with decr gt speed due to pain; no LOB noted with head turns and direction changes   Stairs Stairs: Yes Stairs assistance: Supervision Stair Management: One rail Right;Alternating pattern;Forwards Number of Stairs: 11 General stair comments: able to ambulate steps with supervision for safety; no LOB noted  Wheelchair Mobility    Modified Rankin (Stroke Patients Only)       Balance Overall balance assessment: No apparent balance deficits (not formally assessed)                            High level balance activites: Direction changes;Head turns;Sudden stops;Turns High Level Balance Comments: no LOB noted with balance activities; pt guarded due to pain              Pertinent Vitals/Pain "5/10" at surgical site; described pain as "bloating"; patient repositioned for comfort     Home Living Family/patient expects to be discharged to:: Private residence Living Arrangements: Spouse/significant other Available Help at Discharge: Family;Available 24 hours/day Type of Home: House Home Access: Stairs to enter Entrance Stairs-Rails: Right Entrance Stairs-Number of Steps: 3-4 Home Layout: Two level;Bed/bath upstairs Home Equipment: None      Prior Function Level of Independence: Independent               Hand Dominance        Extremity/Trunk Assessment   Upper Extremity Assessment: Overall WFL for tasks assessed           Lower Extremity Assessment: Overall WFL for tasks assessed      Cervical / Trunk Assessment: Normal  Communication   Communication: No difficulties  Cognition Arousal/Alertness: Awake/alert Behavior During Therapy: WFL for tasks assessed/performed Overall Cognitive Status: Within Functional Limits for tasks assessed                      General Comments General comments (skin integrity, edema, etc.): recommended to pt that he continue ambulating hallways as tolerated until D/C to maintain strength     Exercises  Assessment/Plan    PT Assessment Patent does not need any further PT services  PT Diagnosis     PT Problem List    PT Treatment Interventions     PT Goals (Current goals can be found in the Care Plan section) Acute Rehab PT Goals Patient Stated Goal: home today PT Goal Formulation: No goals set, d/c therapy    Frequency     Barriers to discharge        Co-evaluation               End of Session Equipment Utilized During Treatment: Gait belt Activity  Tolerance: Patient tolerated treatment well Patient left: in bed;with call bell/phone within reach;with family/visitor present Nurse Communication: Mobility status         Time: 9292-4462 PT Time Calculation (min): 14 min   Charges:   PT Evaluation $Initial PT Evaluation Tier I: 1 Procedure PT Treatments $Gait Training: 8-22 mins   PT G CodesGustavus Bryant, [place] 02/16/2014, 3:34 PM

## 2014-02-16 NOTE — Discharge Summary (Signed)
Physician Discharge Summary  MOREY ANDONIAN IOX:[id number] DOB: [birth year] DOA: 02/11/2014  PCP: [name], MD  Admit date: 02/11/2014 Discharge date: 02/16/2014  Time spent: 30 minutes  Recommendations for Outpatient Follow-up:  1. Follow up with surgery as recommended.  2. Follow up with PCP in one week 3. Follow up with GI as needed.   Discharge Diagnoses:  Principal Problem:   Abdominal pain, acute, epigastric Active Problems:   HYPERTENSION   CORONARY ARTERY DISEASE   Chronic renal insufficiency, stage II (mild)   RAS (renal artery stenosis)   Nausea & vomiting   Pancreatitis   Personal history of colonic polyps   Discharge Condition: improved.   Diet recommendation: low fat diet  Filed Weights   02/12/14 0105 02/12/14 0419  Weight: 92.2 kg (203 lb 4.2 oz) 92.2 kg (203 lb 4.2 oz)    History of present illness:  73 year old Caucasian male with history of CAD status post CABG several years ago was stable from cardiac standpoint, presented to the hospital with abdominal pain nausea vomiting due to chronic gallbladder akinesis/dyskinesis, no evidence of active infection, clinically no pancreatitis. Seen by GI and general surgery. underwent laparoscopic cholecystectomy on 02/15/2014. Currently stable.   Hospital Course:  Abdominal pain, due to chronic gallbladder dyskinesis - initial workup was inconclusive, pancreatitis ruled out by GI clinically, lipase normal, HIDA scan positive for low EF without any inflammation. 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 He is now pain-free.  CAD. Status post CABG in the past, currently no acute chest issues, continue beta blocker, statin for secondary prevention.  HTN - stable on ACE, beta blocker, Norvasc combination. Dyslipidemia. On statin. stable check lipid panel.   Procedures: CT scan abdomen pelvis, right upper quadrant ultrasound, HIDA ,  laparoscopic cholecystectomy scheduled for 02/15/2014 afternoon   Consultations:  surrgery  Gastroenterology   Discharge Exam: Filed Vitals:   02/16/14 1320  BP: 127/58  Pulse: 61  Temp: 98.8 F (37.1 C)  Resp: 16    General: alert afebrile comfortable. Cardiovascular: s1s2 Respiratory: ctab  Discharge Instructions You were cared for by a hospitalist during your hospital stay. If you have any questions about your discharge medications or the care you received while you were in the hospital after you are discharged, you can call the unit and asked to speak with the hospitalist on call if the hospitalist that took care of you is not available. Once you are discharged, your primary care physician will handle any further medical issues. Please note that NO REFILLS for any discharge medications will be authorized once you are discharged, as it is imperative that you return to your primary care physician (or establish a relationship with a primary care physician if you do not have one) for your aftercare needs so that they can reassess your need for medications and monitor your lab values.  Discharge Instructions   Diet - low sodium heart healthy    Complete by:  As directed      Discharge instructions    Complete by:  As directed   Follow up with PCP ino ne week.            Medication List    STOP taking these medications       sildenafil 100 MG tablet  Commonly known as:  VIAGRA      TAKE these medications       amLODipine 10 MG tablet  Commonly known as:  NORVASC  Take 10 mg by mouth daily.     aspirin 81 MG tablet  Take 81 mg by mouth daily.     atenolol 50 MG tablet  Commonly known as:  TENORMIN  Take 75 mg by mouth daily.     atorvastatin 20 MG tablet  Commonly known as:  LIPITOR  Take 20 mg by mouth daily.     benazepril 20 MG tablet  Commonly known as:  LOTENSIN  Take 20 mg by mouth 2 (two) times daily.     diazepam 10 MG tablet  Commonly known as:   VALIUM  Take 10 mg by mouth every 12 (twelve) hours as needed for anxiety.     esomeprazole 40 MG capsule  Commonly known as:  NEXIUM  Take 1 capsule (40 mg total) by mouth daily as needed.     fexofenadine 180 MG tablet  Commonly known as:  ALLEGRA  Take 180 mg by mouth daily.     ketorolac 10 MG tablet  Commonly known as:  TORADOL  Take 1 tablet (10 mg total) by mouth every 8 (eight) hours as needed for severe pain.     niacin 1000 MG CR tablet  Commonly known as:  NIASPAN  Take 1 tablet (1,000 mg total) by mouth at bedtime.       Allergies  Allergen Reactions  . Hydrocodone-Acetaminophen Other (See Comments)    REACTION: bladder obstruction  . Tramadol Hcl Other (See Comments)    REACTION: bladder obstruction       Follow-up Information   Follow up with [facility] [place] On 03/15/2014. (For post-operation check. Your appointment is at 2:45pm, please arrive at least 30 min before your appointment to complete your check in paperwork.  If you are unable to arrive 30 min prior to your appointment time we may have to cancel or reschedule you)    Contact information:   [place]   [place] [id number] [phone]        The results of significant diagnostics from this hospitalization (including imaging, microbiology, ancillary and laboratory) are listed below for reference.    Significant Diagnostic Studies: Dg Cholangiogram Operative  02/15/2014   CLINICAL DATA:  Abnormal biliary ejection fraction  EXAM: INTRAOPERATIVE CHOLANGIOGRAM  TECHNIQUE: Cholangiographic images from the C-arm fluoroscopic device were submitted for interpretation post-operatively. Please see the procedural report for the amount of contrast and the fluoroscopy time utilized.  COMPARISON:  Nuclear medicine HIDA scan 02/12/2014; prior abdominal ultrasound 02/12/2014  FINDINGS: Intraoperative staining and spot images demonstrate cannulation of the cystic duct remnant and  opacification of the intra and extrahepatic biliary tree. No evidence of stenosis, stricture, filling defect or dilatation. Contrast material passes freely through the ampulla and into the duodenum. A nasogastric tube is present.  IMPRESSION: Negative intraoperative cholangiogram.   Electronically Signed   By: Jacqulynn Cadet M.D.   On: 02/15/2014 11:24   US Abdomen Complete  02/12/2014   CLINICAL DATA:  Abdominal pain.  EXAM: ULTRASOUND ABDOMEN COMPLETE  COMPARISON:  CT abdomen and pelvis 02/11/2014 and abdominal ultrasound 11/05/2010  FINDINGS: Gallbladder:  No gallstones or wall thickening visualized. No sonographic Murphy sign noted.  Common bile duct:  Diameter: 4 mm  Liver:  Suboptimal visualization of the left lobe. No focal abnormality identified.  IVC:  No abnormality visualized.  Pancreas:  Not visualized due to overlying bowel gas.  Spleen:  Size and appearance within normal limits.  Right Kidney:  Length: 11.2  cm. Echogenicity within normal limits. No mass or hydronephrosis visualized.  Left Kidney:  Length: 11.8 cm. Echogenicity within normal limits. No mass or hydronephrosis visualized.  Abdominal aorta:  No aneurysm visualized.  Other findings:  None.  IMPRESSION: Limited visualization of midline structures due to bowel gas. No acute abnormality identified.   Electronically Signed   By: Logan Bores   On: 02/12/2014 08:56   Ct Abdomen Pelvis W Contrast  02/11/2014   CLINICAL DATA:  Upper abdominal pain since Wednesday. Nausea and vomiting.  EXAM: CT ABDOMEN AND PELVIS WITH CONTRAST  TECHNIQUE: Multidetector CT imaging of the abdomen and pelvis was performed using the standard protocol following bolus administration of intravenous contrast.  CONTRAST:  122mL OMNIPAQUE IOHEXOL 300 MG/ML  SOLN  COMPARISON:  Ultrasound kidneys 11/05/2010  FINDINGS: Dependent changes in the lung bases. Small esophageal hiatal hernia. Postoperative changes in the mediastinum.  But there is evidence of minimal  infiltration in the fat around the head of the pancreas which, given the patient's symptoms could indicate early changes of acute focal pancreatitis. No loculated fluid collections are demonstrated in the pancreatic parenchymal enhancement is normal. Correlation with laboratory studies is recommended.  The liver, spleen, gallbladder, adrenal glands, kidneys, inferior vena cava, and retroperitoneal lymph nodes are unremarkable. Calcification of the abdominal aorta without aneurysm or dissection. Stomach, small bowel, and colon are normal for degree of distention. Scattered stool in the colon. No free air or free fluid in the abdomen. Small amount of fat in the umbilicus.  Pelvis: Appendix is normal. Scattered diverticula in the sigmoid colon without evidence of diverticulitis. Prostate gland is not enlarged. Bladder wall is not thickened. No free or loculated pelvic fluid collections. No pelvic mass or lymphadenopathy. 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 Degenerative changes in the lumbar spine. No destructive bone lesions appreciated.  IMPRESSION: Mild infiltration suggested around the head of the pancreas, possibly indicating early acute pancreatitis. Small esophageal hiatal hernia. 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   Electronically Signed   By: Lucienne Capers M.D.   On: 02/11/2014 22:30   Nm Hepato W/eject Fract  02/12/2014   CLINICAL DATA:  Abdominal pain  EXAM: NUCLEAR MEDICINE HEPATOBILIARY IMAGING WITH GALLBLADDER EF  TECHNIQUE: Sequential images of the abdomen were obtained out to 60 minutes following intravenous administration of radiopharmaceutical. After slow intravenous infusion of Cholecystokinin, gallbladder ejection fraction was determined.  RADIOPHARMACEUTICALS:  [place] GY-69S Choletec  COMPARISON:  None.  FINDINGS: Adequate uptake is noted throughout the liver following injection. The biliary tree is noted at 10 min with gallbladder  visualization at 15 min. Progressive filling of the gallbladder is noted.  Following injection with CCK the gallbladder ejection fraction is calculated at 6% which is well below the expected normal of greater than 30%.  The patient did experience symptoms during CCK infusion.  IMPRESSION: Normal uptake and excretion of biliary tracer.  Severely reduced ejection fraction at 6%.   Electronically Signed   By: Inez Catalina M.D.   On: 02/12/2014 14:41    Microbiology: Recent Results (from the past 240 hour(s))  SURGICAL PCR SCREEN     Status: None   Collection Time    02/13/14  3:13 PM      Result Value Ref Range Status   MRSA, PCR NEGATIVE  NEGATIVE Final   Staphylococcus aureus NEGATIVE  NEGATIVE Final   Comment:            The Xpert SA Assay (FDA  approved for NASAL specimens     in patients over 15 years of age),     is one component of     a comprehensive surveillance     program.  Test performance has     been validated by [employer] for patients greater     than or equal to 52 year old.     It is not intended     to diagnose infection nor to     guide or monitor treatment.     Labs: Basic Metabolic Panel:  Recent Labs Lab 02/12/14 0555 02/13/14 0430 02/14/14 0640 02/15/14 0525 02/16/14 0555  NA 132* 136* 139 141 139  K 4.4 4.6 4.1 4.2 4.1  CL 98 100 103 101 100  CO2 22 21 23 26 26   GLUCOSE 95 87 96 86 135*  BUN 19 20 15 14 16   CREATININE 1.34 1.31 1.18 1.12 1.14  CALCIUM 8.7 8.2* 8.4 8.8 8.5   Liver Function Tests:  Recent Labs Lab 02/12/14 0555 02/13/14 0430 02/14/14 0640 02/15/14 0525 02/16/14 0555  AST 21 21 19 20  58*  ALT 9 9 11 13  44  ALKPHOS 97 76 75 79 116  BILITOT 0.7 0.7 0.7 0.7 0.7  PROT 6.7 6.0 6.1 6.2 6.1  ALBUMIN 3.2* 2.6* 2.8* 3.0* 2.7*    Recent Labs Lab 02/11/14 1851 02/12/14 0555 02/13/14 0430 02/14/14 0640 02/15/14 0525  LIPASE 47 56 31 27 29   AMYLASE  --  48  --   --   --    No results found for this basename:  AMMONIA,  in the last 168 hours CBC:  Recent Labs Lab 02/11/14 1851 02/12/14 0555 02/13/14 0430 02/14/14 0640 02/15/14 0525 02/16/14 0555  WBC 15.1* 13.6* 8.5 7.5 7.2 9.4  NEUTROABS 11.6*  --   --   --   --   --   HGB 15.3 13.9 12.5* 12.6* 13.3 13.1  HCT 43.1 40.8 37.2* 36.9* 38.8* 38.8*  MCV 84.3 84.1 86.7 84.1 83.4 86.2  PLT 145* 165 134* 151 161 163   Cardiac Enzymes: No results found for this basename: CKTOTAL, CKMB, CKMBINDEX, TROPONINI,  in the last 168 hours BNP: BNP (last 3 results) No results found for this basename: PROBNP,  in the last 8760 hours CBG:  Recent Labs Lab 02/15/14 1125  GLUCAP 128*       Signed:  [place]  [facility] 02/16/2014, 5:44 PM

## 2014-02-16 NOTE — Progress Notes (Signed)
Pt c/o not being able to urinate. Prn order to in and out cath carried out. Obtained 900cc of urine.  Will continue to monitor pt urine output

## 2014-02-16 NOTE — Progress Notes (Signed)
Patient urinated  independently approximate 400cc; a sample of urine was sent to lab per MD order.

## 2014-02-16 NOTE — Progress Notes (Addendum)
Doing well. No n/v. Tolerating liquids. Voided spont this pm without assistance. Wife asks about O2 sat  Alert, nad Soft, obese, approp TTP  Discharge when stable per medical service.  pulm toilet. Will defer O2 sat to medicine.   Dustin Ashley. Redmond Pulling, MD, FACS General, Bariatric, & Minimally Invasive Surgery [facility], [place]

## 2014-02-17 ENCOUNTER — Encounter (HOSPITAL_COMMUNITY): Payer: Self-pay | Admitting: General Surgery

## 2014-02-26 ENCOUNTER — Emergency Department (HOSPITAL_COMMUNITY): Payer: Medicare Other

## 2014-02-26 ENCOUNTER — Inpatient Hospital Stay (HOSPITAL_COMMUNITY)
Admission: EM | Admit: 2014-02-26 | Discharge: 2014-03-06 | DRG: 871 | Disposition: A | Discharge status: Home Health | Payer: Medicare Other | Attending: Internal Medicine | Admitting: Internal Medicine

## 2014-02-26 ENCOUNTER — Encounter (HOSPITAL_COMMUNITY): Payer: Self-pay | Admitting: Emergency Medicine

## 2014-02-26 DIAGNOSIS — R34 Anuria and oliguria: Secondary | ICD-10-CM | POA: Diagnosis present

## 2014-02-26 DIAGNOSIS — R1013 Epigastric pain: Secondary | ICD-10-CM

## 2014-02-26 DIAGNOSIS — I824Z9 Acute embolism and thrombosis of unspecified deep veins of unspecified distal lower extremity: Secondary | ICD-10-CM | POA: Diagnosis present

## 2014-02-26 DIAGNOSIS — T4275XA Adverse effect of unspecified antiepileptic and sedative-hypnotic drugs, initial encounter: Secondary | ICD-10-CM | POA: Diagnosis not present

## 2014-02-26 DIAGNOSIS — H9190 Unspecified hearing loss, unspecified ear: Secondary | ICD-10-CM | POA: Diagnosis present

## 2014-02-26 DIAGNOSIS — I4891 Unspecified atrial fibrillation: Secondary | ICD-10-CM | POA: Diagnosis not present

## 2014-02-26 DIAGNOSIS — Z7982 Long term (current) use of aspirin: Secondary | ICD-10-CM | POA: Diagnosis not present

## 2014-02-26 DIAGNOSIS — Z885 Allergy status to narcotic agent status: Secondary | ICD-10-CM

## 2014-02-26 DIAGNOSIS — R112 Nausea with vomiting, unspecified: Secondary | ICD-10-CM

## 2014-02-26 DIAGNOSIS — Z87891 Personal history of nicotine dependence: Secondary | ICD-10-CM

## 2014-02-26 DIAGNOSIS — G4700 Insomnia, unspecified: Secondary | ICD-10-CM | POA: Diagnosis present

## 2014-02-26 DIAGNOSIS — N401 Enlarged prostate with lower urinary tract symptoms: Secondary | ICD-10-CM | POA: Diagnosis not present

## 2014-02-26 DIAGNOSIS — I4892 Unspecified atrial flutter: Secondary | ICD-10-CM | POA: Diagnosis not present

## 2014-02-26 DIAGNOSIS — I2510 Atherosclerotic heart disease of native coronary artery without angina pectoris: Secondary | ICD-10-CM | POA: Diagnosis present

## 2014-02-26 DIAGNOSIS — K859 Acute pancreatitis, unspecified: Secondary | ICD-10-CM

## 2014-02-26 DIAGNOSIS — T40605A Adverse effect of unspecified narcotics, initial encounter: Secondary | ICD-10-CM | POA: Diagnosis not present

## 2014-02-26 DIAGNOSIS — R933 Abnormal findings on diagnostic imaging of other parts of digestive tract: Secondary | ICD-10-CM

## 2014-02-26 DIAGNOSIS — B3781 Candidal esophagitis: Secondary | ICD-10-CM | POA: Diagnosis present

## 2014-02-26 DIAGNOSIS — L851 Acquired keratosis [keratoderma] palmaris et plantaris: Secondary | ICD-10-CM

## 2014-02-26 DIAGNOSIS — R1012 Left upper quadrant pain: Secondary | ICD-10-CM | POA: Diagnosis present

## 2014-02-26 DIAGNOSIS — I701 Atherosclerosis of renal artery: Secondary | ICD-10-CM | POA: Diagnosis present

## 2014-02-26 DIAGNOSIS — G35 Multiple sclerosis: Secondary | ICD-10-CM | POA: Diagnosis present

## 2014-02-26 DIAGNOSIS — D649 Anemia, unspecified: Secondary | ICD-10-CM | POA: Diagnosis present

## 2014-02-26 DIAGNOSIS — I10 Essential (primary) hypertension: Secondary | ICD-10-CM

## 2014-02-26 DIAGNOSIS — I129 Hypertensive chronic kidney disease with stage 1 through stage 4 chronic kidney disease, or unspecified chronic kidney disease: Secondary | ICD-10-CM | POA: Diagnosis present

## 2014-02-26 DIAGNOSIS — Z8249 Family history of ischemic heart disease and other diseases of the circulatory system: Secondary | ICD-10-CM

## 2014-02-26 DIAGNOSIS — R339 Retention of urine, unspecified: Secondary | ICD-10-CM | POA: Diagnosis not present

## 2014-02-26 DIAGNOSIS — N182 Chronic kidney disease, stage 2 (mild): Secondary | ICD-10-CM | POA: Diagnosis present

## 2014-02-26 DIAGNOSIS — I5031 Acute diastolic (congestive) heart failure: Secondary | ICD-10-CM | POA: Diagnosis present

## 2014-02-26 DIAGNOSIS — N170 Acute kidney failure with tubular necrosis: Secondary | ICD-10-CM | POA: Diagnosis present

## 2014-02-26 DIAGNOSIS — K219 Gastro-esophageal reflux disease without esophagitis: Secondary | ICD-10-CM | POA: Diagnosis present

## 2014-02-26 DIAGNOSIS — I2699 Other pulmonary embolism without acute cor pulmonale: Secondary | ICD-10-CM | POA: Diagnosis present

## 2014-02-26 DIAGNOSIS — K567 Ileus, unspecified: Secondary | ICD-10-CM

## 2014-02-26 DIAGNOSIS — K449 Diaphragmatic hernia without obstruction or gangrene: Secondary | ICD-10-CM | POA: Diagnosis present

## 2014-02-26 DIAGNOSIS — Z951 Presence of aortocoronary bypass graft: Secondary | ICD-10-CM

## 2014-02-26 DIAGNOSIS — R6510 Systemic inflammatory response syndrome (SIRS) of non-infectious origin without acute organ dysfunction: Secondary | ICD-10-CM

## 2014-02-26 DIAGNOSIS — N138 Other obstructive and reflux uropathy: Secondary | ICD-10-CM | POA: Diagnosis not present

## 2014-02-26 DIAGNOSIS — I509 Heart failure, unspecified: Secondary | ICD-10-CM | POA: Diagnosis present

## 2014-02-26 DIAGNOSIS — R7989 Other specified abnormal findings of blood chemistry: Secondary | ICD-10-CM | POA: Diagnosis present

## 2014-02-26 DIAGNOSIS — A419 Sepsis, unspecified organism: Secondary | ICD-10-CM | POA: Diagnosis present

## 2014-02-26 DIAGNOSIS — K5909 Other constipation: Secondary | ICD-10-CM | POA: Diagnosis not present

## 2014-02-26 DIAGNOSIS — J189 Pneumonia, unspecified organism: Secondary | ICD-10-CM | POA: Diagnosis present

## 2014-02-26 DIAGNOSIS — R109 Unspecified abdominal pain: Secondary | ICD-10-CM

## 2014-02-26 DIAGNOSIS — E785 Hyperlipidemia, unspecified: Secondary | ICD-10-CM | POA: Diagnosis present

## 2014-02-26 DIAGNOSIS — D72829 Elevated white blood cell count, unspecified: Secondary | ICD-10-CM | POA: Diagnosis present

## 2014-02-26 DIAGNOSIS — K560 Paralytic ileus: Secondary | ICD-10-CM | POA: Diagnosis not present

## 2014-02-26 DIAGNOSIS — R7309 Other abnormal glucose: Secondary | ICD-10-CM | POA: Diagnosis present

## 2014-02-26 DIAGNOSIS — N2889 Other specified disorders of kidney and ureter: Secondary | ICD-10-CM | POA: Diagnosis present

## 2014-02-26 LAB — CBC WITH DIFFERENTIAL/PLATELET
Basophils Absolute: 0 10*3/uL (ref 0.0–0.1)
Basophils Relative: 0 % (ref 0–1)
EOS ABS: 0.1 10*3/uL (ref 0.0–0.7)
Eosinophils Relative: 0 % (ref 0–5)
HCT: 41.8 % (ref 39.0–52.0)
Hemoglobin: 14 g/dL (ref 13.0–17.0)
Lymphocytes Relative: 6 % — ABNORMAL LOW (ref 12–46)
Lymphs Abs: 1 10*3/uL (ref 0.7–4.0)
MCH: 28.3 pg (ref 26.0–34.0)
MCHC: 33.5 g/dL (ref 30.0–36.0)
MCV: 84.4 fL (ref 78.0–100.0)
Monocytes Absolute: 1.4 10*3/uL — ABNORMAL HIGH (ref 0.1–1.0)
Monocytes Relative: 8 % (ref 3–12)
NEUTROS PCT: 86 % — AB (ref 43–77)
Neutro Abs: 15.5 10*3/uL — ABNORMAL HIGH (ref 1.7–7.7)
Platelets: 272 10*3/uL (ref 150–400)
RBC: 4.95 MIL/uL (ref 4.22–5.81)
RDW: 13.2 % (ref 11.5–15.5)
WBC: 18 10*3/uL — ABNORMAL HIGH (ref 4.0–10.5)

## 2014-02-26 LAB — COMPREHENSIVE METABOLIC PANEL
ALK PHOS: 119 U/L — AB (ref 39–117)
ALT: 31 U/L (ref 0–53)
ANION GAP: 14 (ref 5–15)
AST: 26 U/L (ref 0–37)
Albumin: 3.1 g/dL — ABNORMAL LOW (ref 3.5–5.2)
BILIRUBIN TOTAL: 0.7 mg/dL (ref 0.3–1.2)
BUN: 19 mg/dL (ref 6–23)
CHLORIDE: 103 meq/L (ref 96–112)
CO2: 22 mEq/L (ref 19–32)
Calcium: 9 mg/dL (ref 8.4–10.5)
Creatinine, Ser: 1.16 mg/dL (ref 0.50–1.35)
GFR, EST AFRICAN AMERICAN: 70 mL/min — AB (ref >90)
GFR, EST NON AFRICAN AMERICAN: 61 mL/min — AB (ref >90)
GLUCOSE: 129 mg/dL — AB (ref 70–99)
POTASSIUM: 4.1 meq/L (ref 3.7–5.3)
Sodium: 139 mEq/L (ref 137–147)
TOTAL PROTEIN: 6.7 g/dL (ref 6.0–8.3)

## 2014-02-26 LAB — I-STAT TROPONIN, ED: Troponin i, poc: 0.01 ng/mL (ref 0.00–0.08)

## 2014-02-26 LAB — I-STAT CG4 LACTIC ACID, ED: Lactic Acid, Venous: 1.74 mmol/L (ref 0.5–2.2)

## 2014-02-26 LAB — LIPASE, BLOOD: Lipase: 27 U/L (ref 11–59)

## 2014-02-26 MED ORDER — MORPHINE SULFATE 2 MG/ML IJ SOLN
2.0000 mg | Freq: Once | INTRAMUSCULAR | Status: AC
  Administered 2014-02-26: 2 mg via INTRAVENOUS
  Filled 2014-02-26: qty 1

## 2014-02-26 MED ORDER — NIACIN ER (ANTIHYPERLIPIDEMIC) 500 MG PO TBCR
500.0000 mg | EXTENDED_RELEASE_TABLET | Freq: Every day | ORAL | Status: DC
  Filled 2014-02-26: qty 1

## 2014-02-26 MED ORDER — HEPARIN (PORCINE) IN NACL 100-0.45 UNIT/ML-% IJ SOLN
1400.0000 [IU]/h | INTRAMUSCULAR | Status: DC
  Administered 2014-02-26: 1400 [IU]/h via INTRAVENOUS
  Filled 2014-02-26 (×2): qty 250

## 2014-02-26 MED ORDER — ACETAMINOPHEN 325 MG PO TABS: 650.0000 mg | ORAL_TABLET | Freq: Four times a day (QID) | ORAL | Status: DC | PRN

## 2014-02-26 MED ORDER — ONDANSETRON HCL 4 MG/2ML IJ SOLN
4.0000 mg | Freq: Four times a day (QID) | INTRAMUSCULAR | Status: DC | PRN
  Administered 2014-03-02 – 2014-03-03 (×2): 4 mg via INTRAVENOUS
  Filled 2014-02-26 (×2): qty 2

## 2014-02-26 MED ORDER — VANCOMYCIN HCL IN DEXTROSE 1-5 GM/200ML-% IV SOLN
1000.0000 mg | Freq: Once | INTRAVENOUS | Status: AC
  Administered 2014-02-26: 1000 mg via INTRAVENOUS
  Filled 2014-02-26: qty 200

## 2014-02-26 MED ORDER — IOHEXOL 300 MG/ML  SOLN
100.0000 mL | Freq: Once | INTRAMUSCULAR | Status: AC | PRN
  Administered 2014-02-26: 100 mL via INTRAVENOUS

## 2014-02-26 MED ORDER — ASPIRIN EC 81 MG PO TBEC
81.0000 mg | DELAYED_RELEASE_TABLET | Freq: Every day | ORAL | Status: DC
  Filled 2014-02-26: qty 1

## 2014-02-26 MED ORDER — ALBUTEROL SULFATE (2.5 MG/3ML) 0.083% IN NEBU
2.5000 mg | INHALATION_SOLUTION | Freq: Four times a day (QID) | RESPIRATORY_TRACT | Status: DC
  Administered 2014-02-26: 2.5 mg via RESPIRATORY_TRACT

## 2014-02-26 MED ORDER — MORPHINE SULFATE 4 MG/ML IJ SOLN
4.0000 mg | Freq: Once | INTRAMUSCULAR | Status: AC
  Administered 2014-02-26: 4 mg via INTRAVENOUS
  Filled 2014-02-26: qty 1

## 2014-02-26 MED ORDER — HYDROMORPHONE HCL PF 1 MG/ML IJ SOLN
0.5000 mg | INTRAMUSCULAR | Status: DC | PRN
  Administered 2014-02-26 – 2014-02-27 (×4): 1 mg via INTRAVENOUS
  Administered 2014-02-27 (×2): 0.5 mg via INTRAVENOUS
  Administered 2014-02-27: 1 mg via INTRAVENOUS
  Administered 2014-02-28 (×2): 0.5 mg via INTRAVENOUS
  Filled 2014-02-26 (×9): qty 1

## 2014-02-26 MED ORDER — ALUM & MAG HYDROXIDE-SIMETH 200-200-20 MG/5ML PO SUSP
30.0000 mL | Freq: Four times a day (QID) | ORAL | Status: DC | PRN
  Administered 2014-02-28: 30 mL via ORAL
  Filled 2014-02-26: qty 30

## 2014-02-26 MED ORDER — VANCOMYCIN HCL 10 G IV SOLR
1250.0000 mg | Freq: Two times a day (BID) | INTRAVENOUS | Status: DC
  Administered 2014-02-27: 1250 mg via INTRAVENOUS
  Filled 2014-02-26 (×3): qty 1250

## 2014-02-26 MED ORDER — ONDANSETRON HCL 4 MG/2ML IJ SOLN
4.0000 mg | Freq: Once | INTRAMUSCULAR | Status: AC
  Administered 2014-02-26: 4 mg via INTRAVENOUS
  Filled 2014-02-26: qty 2

## 2014-02-26 MED ORDER — SODIUM CHLORIDE 0.9 % IV SOLN
INTRAVENOUS | Status: DC
  Administered 2014-02-26: 23:00:00 via INTRAVENOUS

## 2014-02-26 MED ORDER — PIPERACILLIN-TAZOBACTAM 3.375 G IVPB
3.3750 g | Freq: Once | INTRAVENOUS | Status: AC
  Administered 2014-02-26: 3.375 g via INTRAVENOUS
  Filled 2014-02-26: qty 50

## 2014-02-26 MED ORDER — SODIUM CHLORIDE 0.9 % IJ SOLN
3.0000 mL | Freq: Two times a day (BID) | INTRAMUSCULAR | Status: DC
  Administered 2014-02-26 – 2014-03-06 (×11): 3 mL via INTRAVENOUS

## 2014-02-26 MED ORDER — PIPERACILLIN-TAZOBACTAM 3.375 G IVPB
3.3750 g | Freq: Three times a day (TID) | INTRAVENOUS | Status: DC
  Administered 2014-02-27 – 2014-03-04 (×16): 3.375 g via INTRAVENOUS
  Filled 2014-02-26 (×20): qty 50

## 2014-02-26 MED ORDER — IOHEXOL 300 MG/ML  SOLN
25.0000 mL | INTRAMUSCULAR | Status: AC
  Administered 2014-02-26: 25 mL via ORAL

## 2014-02-26 MED ORDER — ATORVASTATIN CALCIUM 20 MG PO TABS
20.0000 mg | ORAL_TABLET | Freq: Every day | ORAL | Status: DC
  Filled 2014-02-26: qty 1

## 2014-02-26 MED ORDER — PANTOPRAZOLE SODIUM 40 MG PO TBEC
40.0000 mg | DELAYED_RELEASE_TABLET | Freq: Every day | ORAL | Status: DC
  Administered 2014-02-27: 40 mg via ORAL
  Filled 2014-02-26: qty 1

## 2014-02-26 MED ORDER — ONDANSETRON HCL 4 MG PO TABS: 4.0000 mg | ORAL_TABLET | Freq: Four times a day (QID) | ORAL | Status: DC | PRN

## 2014-02-26 MED ORDER — HYDROMORPHONE HCL PF 1 MG/ML IJ SOLN
1.0000 mg | Freq: Once | INTRAMUSCULAR | Status: AC
  Administered 2014-02-26: 1 mg via INTRAVENOUS
  Filled 2014-02-26: qty 1

## 2014-02-26 MED ORDER — ALBUTEROL SULFATE (2.5 MG/3ML) 0.083% IN NEBU
2.5000 mg | INHALATION_SOLUTION | RESPIRATORY_TRACT | Status: DC | PRN
  Filled 2014-02-26: qty 3

## 2014-02-26 MED ORDER — PIPERACILLIN-TAZOBACTAM 3.375 G IVPB 30 MIN: 3.3750 g | Freq: Once | INTRAVENOUS | Status: DC

## 2014-02-26 MED ORDER — AMLODIPINE BESYLATE 10 MG PO TABS
10.0000 mg | ORAL_TABLET | Freq: Every day | ORAL | Status: DC
  Filled 2014-02-26: qty 1

## 2014-02-26 MED ORDER — LORATADINE 10 MG PO TABS
10.0000 mg | ORAL_TABLET | Freq: Every day | ORAL | Status: DC
  Filled 2014-02-26 (×2): qty 1

## 2014-02-26 MED ORDER — ATENOLOL 50 MG PO TABS
75.0000 mg | ORAL_TABLET | Freq: Every day | ORAL | Status: DC
  Filled 2014-02-26: qty 1

## 2014-02-26 MED ORDER — DIAZEPAM 5 MG PO TABS: 10.0000 mg | ORAL_TABLET | Freq: Two times a day (BID) | ORAL | Status: DC | PRN

## 2014-02-26 MED ORDER — BENAZEPRIL HCL 20 MG PO TABS
20.0000 mg | ORAL_TABLET | Freq: Two times a day (BID) | ORAL | Status: DC
  Administered 2014-02-26: 20 mg via ORAL
  Filled 2014-02-26 (×3): qty 1

## 2014-02-26 MED ORDER — ACETAMINOPHEN 650 MG RE SUPP
650.0000 mg | Freq: Four times a day (QID) | RECTAL | Status: DC | PRN
  Administered 2014-03-03: 650 mg via RECTAL
  Filled 2014-02-26: qty 1

## 2014-02-26 MED ORDER — MORPHINE SULFATE 2 MG/ML IJ SOLN: 2.0000 mg | Freq: Once | INTRAMUSCULAR | Status: DC

## 2014-02-26 MED ORDER — HEPARIN BOLUS VIA INFUSION
4000.0000 [IU] | Freq: Once | INTRAVENOUS | Status: AC
Start: 2014-02-26 — End: 2014-02-26
  Administered 2014-02-26: 4000 [IU] via INTRAVENOUS
  Filled 2014-02-26: qty 4000

## 2014-02-26 NOTE — ED Notes (Signed)
Patient transported to X-ray 

## 2014-02-26 NOTE — ED Provider Notes (Signed)
Medical screening examination/treatment/procedure(s) were conducted as a shared visit with non-physician practitioner(s) and myself.  I personally evaluated the patient during the encounter.   EKG Interpretation   Date/Time:  Saturday February 26 2014 14:20:48 EDT Ventricular Rate:  71 PR Interval:  248 QRS Duration: 84 QT Interval:  444 QTC Calculation: 482 R Axis:   59 Text Interpretation:  Sinus rhythm with 1st degree A-V block T wave  abnormality, consider anterior ischemia Prolonged QT Abnormal ECG No  significant change since last tracing Confirmed by Tailynn Armetta  MD, Mailynn Everly  ([id number]) on 02/26/2014 3:59:33 PM     Patient here complaining of upper abdominal pain that began this morning which radiates to back and shoulders. He describes myalgias and rigors. His abdominal exam diffusely tender. Denies any cough or congestion. Chest x-rays here is normal. Concerned that he could have an intra-abdominal versus pulmonary infection still. When for results of chest and abdominal CT  Leota Jacobsen, MD 02/26/14 [id number]

## 2014-02-26 NOTE — ED Notes (Signed)
Returned from xray having a pain attack in upper abdomen and back in the same place as this morning's episode pta. Notified Pickering, NP. EKG done.

## 2014-02-26 NOTE — ED Notes (Signed)
Patient returned from CT

## 2014-02-26 NOTE — ED Provider Notes (Signed)
CSN: [id number]     Arrival date & time 02/26/14  1411 History   First MD Initiated Contact with Patient 02/26/14 1449     Chief Complaint  Patient presents with  . Abdominal Pain     (Consider location/radiation/quality/duration/timing/severity/associated sxs/prior Treatment) HPI Comments: Pt states that he woke up this morning with left upper quadrant abdominal pain and radiates to the upper back and right shoulder. Pt states that he got diaphoretic from the pain. States that he took dilaudid with some relief. Denies vomiting,diarrhea, cp or sob. Pt states that he had a fever this morning as well. He had surgery to have gallbladder removed 11 days ago. Pt states that he has been slowly feeling better.  The history is provided by the patient. No language interpreter was used.    Past Medical History  Diagnosis Date  . HOH (hard of hearing)     has a hearing aid L, sees audiology rountinely  . Allergic rhinitis   . CAD (coronary artery disease)   . GERD (gastroesophageal reflux disease)   . Hyperlipemia   . Hypertension   . Hyperglycemia     A1C 5.8 04-2010  . Pain, joint, multiple sites     uses valium, occ uses for insomnia. uses for shoulder  pain  . Chronic renal insufficiency, stage II (mild)     Cr ~ 1.4, u/s 4-12 normal kidneys  . 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 Personal history of colonic polyps    Past Surgical History  Procedure Laterality Date  . Coronary artery bypass graft  1995  . Colonoscopy  multiple  . Cholecystectomy N/A 02/15/2014    Procedure: LAPAROSCOPIC CHOLECYSTECTOMY with IOC;  Surgeon: Gayland Curry, MD;  Location: [facility] OR;  Service: General;  Laterality: N/A;   Family History  Problem Relation Age of Onset  . Hypertension Father   . Heart attack      2 brothers, CABG  . Cancer Neg Hx     colon, prostate  . Diabetes Neg Hx    History  Substance Use Topics  . Smoking status: Former Smoker    Quit date: 03/13/1976  . Smokeless tobacco:  Never Used  . Alcohol Use: No    Review of Systems  Constitutional: Negative.   Respiratory: Negative.   Cardiovascular: Negative.       Allergies  Hydrocodone-acetaminophen and Tramadol hcl  Home Medications   Prior to Admission medications   Medication Sig Start Date End Date Taking? Authorizing Provider  amLODipine (NORVASC) 10 MG tablet Take 10 mg by mouth daily.   Yes Historical Provider, MD  aspirin 81 MG tablet Take 81 mg by mouth daily.     Yes Historical Provider, MD  atenolol (TENORMIN) 50 MG tablet Take 75 mg by mouth daily.   Yes Historical Provider, MD  atorvastatin (LIPITOR) 20 MG tablet Take 20 mg by mouth daily.    Yes Historical Provider, MD  benazepril (LOTENSIN) 20 MG tablet Take 20 mg by mouth 2 (two) times daily.   Yes Historical Provider, MD  diazepam (VALIUM) 10 MG tablet Take 10 mg by mouth every 12 (twelve) hours as needed for anxiety.   Yes Historical Provider, MD  esomeprazole (NEXIUM) 40 MG capsule Take 1 capsule (40 mg total) by mouth daily as needed. 06/08/13  Yes Colon Branch, MD  fexofenadine (ALLEGRA) 180 MG tablet Take 180 mg by mouth daily.     Yes Historical Provider, MD  ketorolac (TORADOL) 10 MG  tablet Take 1 tablet (10 mg total) by mouth every 8 (eight) hours as needed for severe pain. 02/16/14  Yes Hosie Poisson, MD  niacin (NIASPAN) 500 MG CR tablet Take 500 mg by mouth at bedtime.   Yes Historical Provider, MD   BP 131/56  Pulse 66  Temp(Src) 98.4 F (36.9 C) (Oral)  Resp 18  SpO2 96% Physical Exam  Nursing note and vitals reviewed. Constitutional: He is oriented to person, place, and time. He appears well-developed and well-nourished.  Cardiovascular: Normal rate and regular rhythm.   Pulmonary/Chest: Effort normal and breath sounds normal.  Abdominal: Soft. Bowel sounds are normal.  Diffusely tender  Musculoskeletal: Normal range of motion.  Neurological: He is alert and oriented to person, place, and time. Coordination normal.   Skin: Skin is warm and dry.  Psychiatric: He has a normal mood and affect.    ED Course  Procedures (including critical care time) Labs Review Labs Reviewed  CBC WITH DIFFERENTIAL - Abnormal; Notable for the following:    WBC 18.0 (*)    Neutrophils Relative % 86 (*)    Neutro Abs 15.5 (*)    Lymphocytes Relative 6 (*)    Monocytes Absolute 1.4 (*)    All other components within normal limits  COMPREHENSIVE METABOLIC PANEL - Abnormal; Notable for the following:    Glucose, Bld 129 (*)    Albumin 3.1 (*)    Alkaline Phosphatase 119 (*)    GFR calc non Af Amer 61 (*)    GFR calc Af Amer 70 (*)    All other components within normal limits  CULTURE, BLOOD (ROUTINE X 2)  CULTURE, BLOOD (ROUTINE X 2)  LIPASE, BLOOD  URINALYSIS, ROUTINE W REFLEX MICROSCOPIC  I-STAT TROPOININ, ED  I-STAT CG4 LACTIC ACID, ED    Imaging Review Dg Chest 2 View  02/26/2014   CLINICAL DATA:  Fever.  EXAM: CHEST  2 VIEW  COMPARISON:  None.  FINDINGS: Lung volumes are low. There is some basilar atelectasis. No consolidative process, pneumothorax or effusion is identified. Heart size is normal. The patient is status post CABG.  IMPRESSION: No acute disease in a low volume chest.   Electronically Signed   By: Inge Rise M.D.   On: 02/26/2014 16:46   Ct Angio Chest Pe W/cm &/or Wo Cm  02/26/2014   CLINICAL DATA:  Upper abdominal pain, back pain, shoulder pain. Recent gallbladder surgery. Chest pain. Concern for pulmonary embolism  EXAM: CT ANGIOGRAPHY CHEST WITH CONTRAST  TECHNIQUE: Multidetector CT imaging of the chest was performed using the standard protocol during bolus administration of intravenous contrast. Multiplanar CT image reconstructions and MIPs were obtained to evaluate the vascular anatomy.  CONTRAST:  174mL OMNIPAQUE IOHEXOL 300 MG/ML  SOLN  COMPARISON:  None.  FINDINGS: There is a than tubular filling defect within the central aspect of the left lower lobe pulmonary artery (image 71, series  601). Second wall adherent defect within an anterior left lower lobe pulmonary artery (image 63). No upper lobe pulmonary emboli or right lung pulmonary emboli.  No acute findings of the aorta great vessels. No pericardial fluid. Coronary calcifications noted. Patient status post bypass graft surgery.  Review of the lung windows demonstrate bibasilar atelectasis and mild interstitial edema. There is mild airspace disease in the left lower lobe.  Review of the MIP images confirms the above findings.  IMPRESSION: 1. Small pulmonary emboli within a subsegmental left lower lobe pulmonary arteries. Overall clot burden is minimal. 2. Bibasilar  atelectasis and concern for airspace disease at left lung base. This could represent pneumonia or aspiration pneumonitis. Small pulmonary infarction would also be in differential. Findings conveyed toVRINDA Ravleen Ries on 02/26/2014  at18:39.   Electronically Signed   By: Suzy Bouchard M.D.   On: 02/26/2014 18:39   Ct Abdomen Pelvis W Contrast  02/26/2014   CLINICAL DATA:  Upper abdominal pain, pain to back of shoulders, fell hot, diaphoretic, and "funny"; patient is post cholecystectomy 1 week ago  EXAM: CT ABDOMEN AND PELVIS WITH CONTRAST  TECHNIQUE: Multidetector CT imaging of the abdomen and pelvis was performed using the standard protocol following bolus administration of intravenous contrast. Sagittal and coronal MPR images reconstructed from axial data set. CTA chest reported separately.  CONTRAST:  180mL OMNIPAQUE IOHEXOL 300 MG/ML  SOLN IV  COMPARISON:  02/11/2014  FINDINGS: Bibasilar atelectasis.  Post median sternotomy and cholecystectomy.  Liver, spleen, pancreas, kidneys, and adrenal glands normal appearance.  Small postoperative fluid collection at gallbladder fossa, 3.2 x 2.1 cm image 19 containing no gas.  Small amount of free pelvic fluid dependently.  Normal appendix.  Unremarkable bladder and ureters.  Small hiatal hernia with stomach and small bowel loops  otherwise unremarkable.  No mass, adenopathy, free air or acute bone lesion.  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  IMPRESSION: Small postoperative fluid collection at gallbladder fossa post cholecystectomy measuring 3.2 x 2.1 cm, nonspecific, could be sterile or infected.  Small amount of nonspecific free pelvic fluid.  No other intra-abdominal or intrapelvic abnormalities.   Electronically Signed   By: Lavonia Dana M.D.   On: 02/26/2014 18:32     EKG Interpretation   Date/Time:  Saturday February 26 2014 14:20:48 EDT Ventricular Rate:  71 PR Interval:  248 QRS Duration: 84 QT Interval:  444 QTC Calculation: 482 R Axis:   59 Text Interpretation:  Sinus rhythm with 1st degree A-V block T wave  abnormality, consider anterior ischemia Prolonged QT Abnormal ECG No  significant change since last tracing Confirmed by ALLEN  MD, ANTHONY  ([id number]) on 02/26/2014 3:59:33 PM      MDM   Final diagnoses:  Healthcare-associated pneumonia  Pulmonary embolism    Spoke with surgery DR. Tsuei and he stated that the fluid in the abdomen is normal at this time. Will admit pt for pe and likely pneumonia.pt started on heparin and vancomycin and zosyn as had had recent admission to the hospital. Pt to be admitted for pe and possible pneumonia. Vitals stable    Glendell Docker, NP 02/26/14 2005

## 2014-02-26 NOTE — H&P (Signed)
[facility] Admission History and Physical       [name]:[id number] DOB: [birth year] DOA: 02/26/2014  Referring physician: EDP PCP: [name], MD  Specialists:   Chief Complaint: ABD Pain and Fever  HPI: Dustin Ashley is a 73 y.o. male with a recent cholecystectomy on 02/14/2014 who presents with complaints of severe upper ABD Pain radiating into both shoulders that started this am at around 5 AM.   He began to develop fevers at home by the afternoon to 101.5, his wife gave him tylenol. In the ED, a CTA of the Chest revealed LLL pneumonia and small Pulmonary Emboli.  He was placed on an IV Heparin drip and and coverage for HCAP with IV Vancomycin a dn Zosyn.      Review of Systems:  Constitutional: No Weight Loss, No Weight Gain, Night Sweats, +Fevers, +Chills, Dizziness, Fatigue, or Generalized Weakness HEENT: No Headaches, Difficulty Swallowing,Tooth/Dental Problems,Sore Throat,  No Sneezing, Rhinitis, Ear Ache, Nasal Congestion, or Post Nasal Drip,  Cardio-vascular:  No Chest pain, Orthopnea, PND, Edema in Lower Extremities, Anasarca, Dizziness, Palpitations  Resp: No Dyspnea, No DOE, No Productive Cough, No Non-Productive Cough, No Hemoptysis, No Wheezing.    GI: No Heartburn, Indigestion, +Abdominal Pain, Nausea, Vomiting, Diarrhea, Hematemesis, Hematochezia, Melena, Change in Bowel Habits,  Loss of Appetite  GU: No Dysuria, Change in Color of Urine, No Urgency or Frequency, No Flank pain.  Musculoskeletal: No Joint Pain or Swelling, No Decreased Range of Motion, +Back Pain.  Neurologic: No Syncope, No Seizures, Muscle Weakness, Paresthesia, Vision Disturbance or Loss, No Diplopia, No Vertigo, No Difficulty Walking,  Skin: No Rash or Lesions. Psych: No Change in Mood or Affect, No Depression or Anxiety, No Memory loss, No Confusion, or Hallucinations   Past Medical History  Diagnosis Date  . HOH (hard of hearing)     has a hearing aid L, sees audiology  rountinely  . Allergic rhinitis   . CAD (coronary artery disease)   . GERD (gastroesophageal reflux disease)   . Hyperlipemia   . Hypertension   . Hyperglycemia     A1C 5.8 04-2010  . Pain, joint, multiple sites     uses valium, occ uses for insomnia. uses for shoulder  pain  . Chronic renal insufficiency, stage II (mild)     Cr ~ 1.4, u/s 4-12 normal kidneys  . 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 Personal history of colonic polyps     Past Surgical History  Procedure Laterality Date  . Coronary artery bypass graft  1995  . Colonoscopy  multiple  . Cholecystectomy N/A 02/15/2014    Procedure: LAPAROSCOPIC CHOLECYSTECTOMY with IOC;  Surgeon: Gayland Curry, MD;  Location: [facility];  Service: General;  Laterality: N/A;      Prior to Admission medications   Medication Sig Start Date End Date Taking? Authorizing Provider  amLODipine (NORVASC) 10 MG tablet Take 10 mg by mouth daily.   Yes Historical Provider, MD  aspirin 81 MG tablet Take 81 mg by mouth daily.     Yes Historical Provider, MD  atenolol (TENORMIN) 50 MG tablet Take 75 mg by mouth daily.   Yes Historical Provider, MD  atorvastatin (LIPITOR) 20 MG tablet Take 20 mg by mouth daily.    Yes Historical Provider, MD  benazepril (LOTENSIN) 20 MG tablet Take 20 mg by mouth 2 (two) times daily.   Yes Historical Provider, MD  diazepam (VALIUM) 10 MG tablet Take 10 mg by mouth every  12 (twelve) hours as needed for anxiety.   Yes Historical Provider, MD  esomeprazole (NEXIUM) 40 MG capsule Take 1 capsule (40 mg total) by mouth daily as needed. 06/08/13  Yes Colon Branch, MD  fexofenadine (ALLEGRA) 180 MG tablet Take 180 mg by mouth daily.     Yes Historical Provider, MD  ketorolac (TORADOL) 10 MG tablet Take 1 tablet (10 mg total) by mouth every 8 (eight) hours as needed for severe pain. 02/16/14  Yes Hosie Poisson, MD  niacin (NIASPAN) 500 MG CR tablet Take 500 mg by mouth at bedtime.   Yes Historical Provider, MD    Allergies    Allergen Reactions  . Hydrocodone-Acetaminophen Other (See Comments)    REACTION: bladder obstruction  . Tramadol Hcl Other (See Comments)    REACTION: bladder obstruction    Social History:  reports that he quit smoking about 37 years ago. He has never used smokeless tobacco. He reports that he does not drink alcohol or use illicit drugs.     Family History  Problem Relation Age of Onset  . Hypertension Father   . Heart attack      2 brothers, CABG  . Cancer Neg Hx     colon, prostate  . Diabetes Neg Hx        Physical Exam:  GEN:  Pleasant Obese Elderly  73 y.o. Caucasian male examined and in no acute distress; cooperative with exam Filed Vitals:   02/26/14 1830 02/26/14 1900 02/26/14 2003 02/26/14 2030  BP: 130/59 137/57 128/56 133/63  Pulse: 65 62 61 62  Temp:      TempSrc:      Resp: 21 23 23 20   SpO2: 100% 96% 95% 95%   Blood pressure 133/63, pulse 62, temperature 98.4 F (36.9 C), temperature source Oral, resp. rate 20, SpO2 95.00%. PSYCH: He is alert and oriented x4; does not appear anxious does not appear depressed; affect is normal HEENT: Normocephalic and Atraumatic, Mucous membranes pink; PERRLA; EOM intact; Fundi:  Benign;  No scleral icterus, Nares: Patent, Oropharynx: Clear, Fair Dentition,    Neck:  FROM, No Cervical Lymphadenopathy nor Thyromegaly or Carotid Bruit; No JVD; Breasts:: Not examined CHEST WALL: No tenderness CHEST: Normal respiration, clear to auscultation bilaterally HEART: Regular rate and rhythm; no murmurs rubs or gallops BACK: No kyphosis or scoliosis; No CVA tenderness ABDOMEN: Positive Bowel Sounds, Obese, Soft,  Mildly tenderness Diffusely, No Masses, No Organomegaly. Rectal Exam: Not done EXTREMITIES: No Cyanosis, Clubbing, or Edema; No Ulcerations. Genitalia: not examined PULSES: 2+ and symmetric SKIN: Normal hydration no rash or ulceration CNS: Alert and Oriented x 4,  No Focal Deficits Vascular: pulses palpable throughout     Labs on Admission:  Basic Metabolic Panel:  Recent Labs Lab 02/26/14 1510  NA 139  K 4.1  CL 103  CO2 22  GLUCOSE 129*  BUN 19  CREATININE 1.16  CALCIUM 9.0   Liver Function Tests:  Recent Labs Lab 02/26/14 1510  AST 26  ALT 31  ALKPHOS 119*  BILITOT 0.7  PROT 6.7  ALBUMIN 3.1*    Recent Labs Lab 02/26/14 1510  LIPASE 27   No results found for this basename: AMMONIA,  in the last 168 hours CBC:  Recent Labs Lab 02/26/14 1510  WBC 18.0*  NEUTROABS 15.5*  HGB 14.0  HCT 41.8  MCV 84.4  PLT 272   Cardiac Enzymes: No results found for this basename: CKTOTAL, CKMB, CKMBINDEX, TROPONINI,  in the last 168 hours  BNP (last 3 results) No results found for this basename: PROBNP,  in the last 8760 hours CBG: No results found for this basename: GLUCAP,  in the last 168 hours  Radiological Exams on Admission: Dg Chest 2 View  02/26/2014   CLINICAL DATA:  Fever.  EXAM: CHEST  2 VIEW  COMPARISON:  None.  FINDINGS: Lung volumes are low. There is some basilar atelectasis. No consolidative process, pneumothorax or effusion is identified. Heart size is normal. The patient is status post CABG.  IMPRESSION: No acute disease in a low volume chest.   Electronically Signed   By: Inge Rise M.D.   On: 02/26/2014 16:46   Ct Angio Chest Pe W/cm &/or Wo Cm  02/26/2014   CLINICAL DATA:  Upper abdominal pain, back pain, shoulder pain. Recent gallbladder surgery. Chest pain. Concern for pulmonary embolism  EXAM: CT ANGIOGRAPHY CHEST WITH CONTRAST  TECHNIQUE: Multidetector CT imaging of the chest was performed using the standard protocol during bolus administration of intravenous contrast. Multiplanar CT image reconstructions and MIPs were obtained to evaluate the vascular anatomy.  CONTRAST:  163mL OMNIPAQUE IOHEXOL 300 MG/ML  SOLN  COMPARISON:  None.  FINDINGS: There is a than tubular filling defect within the central aspect of the left lower lobe pulmonary artery (image 71,  series 601). Second wall adherent defect within an anterior left lower lobe pulmonary artery (image 63). No upper lobe pulmonary emboli or right lung pulmonary emboli.  No acute findings of the aorta great vessels. No pericardial fluid. Coronary calcifications noted. Patient status post bypass graft surgery.  Review of the lung windows demonstrate bibasilar atelectasis and mild interstitial edema. There is mild airspace disease in the left lower lobe.  Review of the MIP images confirms the above findings.  IMPRESSION: 1. Small pulmonary emboli within a subsegmental left lower lobe pulmonary arteries. Overall clot burden is minimal. 2. Bibasilar atelectasis and concern for airspace disease at left lung base. This could represent pneumonia or aspiration pneumonitis. Small pulmonary infarction would also be in differential. Findings conveyed toVRINDA PICKERING on 02/26/2014  at18:39.   Electronically Signed   By: Suzy Bouchard M.D.   On: 02/26/2014 18:39   Ct Abdomen Pelvis W Contrast  02/26/2014   CLINICAL DATA:  Upper abdominal pain, pain to back of shoulders, fell hot, diaphoretic, and "funny"; patient is post cholecystectomy 1 week ago  EXAM: CT ABDOMEN AND PELVIS WITH CONTRAST  TECHNIQUE: Multidetector CT imaging of the abdomen and pelvis was performed using the standard protocol following bolus administration of intravenous contrast. Sagittal and coronal MPR images reconstructed from axial data set. CTA chest reported separately.  CONTRAST:  148mL OMNIPAQUE IOHEXOL 300 MG/ML  SOLN IV  COMPARISON:  02/11/2014  FINDINGS: Bibasilar atelectasis.  Post median sternotomy and cholecystectomy.  Liver, spleen, pancreas, kidneys, and adrenal glands normal appearance.  Small postoperative fluid collection at gallbladder fossa, 3.2 x 2.1 cm image 19 containing no gas.  Small amount of free pelvic fluid dependently.  Normal appendix.  Unremarkable bladder and ureters.  Small hiatal hernia with stomach and small bowel  loops otherwise unremarkable.  No mass, adenopathy, free air or acute bone lesion.  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  IMPRESSION: Small postoperative fluid collection at gallbladder fossa post cholecystectomy measuring 3.2 x 2.1 cm, nonspecific, could be sterile or infected.  Small amount of nonspecific free pelvic fluid.  No other intra-abdominal or intrapelvic abnormalities.   Electronically Signed   By: Crist Infante.D.  On: 02/26/2014 18:32      Assessment/Plan:   73 y.o. male with  Principal Problem:   1.   Pulmonary embolism   IV Heparin drip   Oxygen  Active Problems:   2.   HCAP (healthcare-associated pneumonia)   IV Vancomycin and Zosyn   Albuterol Nebs   Oxygen PRN    3.   HYPERLIPIDEMIA   Continue Atorvastatin and Niacin       4.   HYPERTENSION   Continue Amlodipine, Benazepril, and Atenolol   Monitor BPs      5.   CORONARY ARTERY DISEASE   On Atenolol, ASA, and Benazepril RX    6.   HYPERGLYCEMIA   Carb Mod Diet   Check HbA1C.       7.   Chronic renal insufficiency, stage II (mild)   Monitor BUN/Cr       8.   Leukocytosis- due to #1   Monitor Trend       9.   DVT Prophylaxis   On IV Heparin      Code Status:   FULL CODE Family Communication:   Wife at Bedside  Disposition Plan:    Inpatient     Time spent:  [place] [facility] Pager [phone]   If [place] Please Contact the Day Rounding Team MD for [facility]  If 7PM-7AM, Please Contact night-coverage  [email or web address] Password [facility] 02/26/2014, 8:49 PM

## 2014-02-26 NOTE — Progress Notes (Addendum)
ANTICOAGULATION CONSULT NOTE - Initial Consult  Pharmacy Consult for Heparin Indication: pulmonary embolus  Allergies  Allergen Reactions  . Hydrocodone-Acetaminophen Other (See Comments)    REACTION: bladder obstruction  . Tramadol Hcl Other (See Comments)    REACTION: bladder obstruction    Patient Measurements:   Height: 70 inches Weight: 92.2 kg IBW: 73 kg Heparin Dosing Weight: 92 kg  Vital Signs: Temp: 98.4 F (36.9 C) (08/15 1423) Temp src: Oral (08/15 1423) BP: 130/59 mmHg (08/15 1830) Pulse Rate: 65 (08/15 1830)  Labs:  Recent Labs  02/26/14 1510  HGB 14.0  HCT 41.8  PLT 272  CREATININE 1.16   CrCl ~ 56ml/min  The CrCl is unknown because both a height and weight (above a minimum accepted value) are required for this calculation.   Medical History: Past Medical History  Diagnosis Date  . HOH (hard of hearing)     has a hearing aid L, sees audiology rountinely  . Allergic rhinitis   . CAD (coronary artery disease)   . GERD (gastroesophageal reflux disease)   . Hyperlipemia   . Hypertension   . Hyperglycemia     A1C 5.8 04-2010  . Pain, joint, multiple sites     uses valium, occ uses for insomnia. uses for shoulder  pain  . Chronic renal insufficiency, stage II (mild)     Cr ~ 1.4, u/s 4-12 normal kidneys  . 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 Personal history of colonic polyps     Medications:  ASA, Amlodipine, Atenolol, Lipitor, Benazepril, Allegra, Niacin, Ketorolac PRN  Assessment: 73 yo M presented to ED with abdominal pain that radiated to his back and shoulders.  Pt also reported T101.5 and diaphoresis.  Pt is s/p lap chole on 02/15/14.   CT scan showed small pulmonary emboli within the left lower lobe pulmonary arteries.  Baseline CBC wnl.  No bleeding hx noted.  Goal of Therapy:  Heparin level 0.3-0.7 units/ml Monitor platelets by anticoagulation protocol: Yes   Plan:  Heparin 4000 units IV bolus x 1 Heparin infusion at 1400  units/hr. Heparin level in 6 hours. Heparin level and CBC daily while on heparin.  [employer], Pharm.D., BCPS Clinical Pharmacist Pager [phone] 02/26/2014 7:16 PM    Addendum:  Cephas Darby added to cover for PNA.  Scr is ok at baseline. Will shoot for a trough of 15-20  Plan  Vanc 1.25g IV q12 Zosyn 3.375g IV q8  Onnie Boer, PharmD Pager: [phone] 02/26/2014 8:44 PM

## 2014-02-26 NOTE — ED Notes (Signed)
CG-4 result reported to [place] Pickering-NP

## 2014-02-26 NOTE — ED Notes (Signed)
Patient transported to CT 

## 2014-02-26 NOTE — ED Notes (Signed)
Pt reports this AM he awoke with upper abdominal pain as well as pain to back of shoulders. States he felt hot, diaphoretic and "I just felt funny." PT had gall bladder removed appx 1 week ago. Wife states he had temp this AM of 101.5. Pt took 1000 mg Tylenol and 2 mg Dilaudid at 0500 today. Pt in NAD. Denies CP, SOB, N/V/D. AO x4.

## 2014-02-27 DIAGNOSIS — R1032 Left lower quadrant pain: Secondary | ICD-10-CM

## 2014-02-27 DIAGNOSIS — R6510 Systemic inflammatory response syndrome (SIRS) of non-infectious origin without acute organ dysfunction: Secondary | ICD-10-CM

## 2014-02-27 DIAGNOSIS — I2699 Other pulmonary embolism without acute cor pulmonale: Secondary | ICD-10-CM

## 2014-02-27 LAB — URINALYSIS, ROUTINE W REFLEX MICROSCOPIC
Bilirubin Urine: NEGATIVE
GLUCOSE, UA: NEGATIVE mg/dL
HGB URINE DIPSTICK: NEGATIVE
KETONES UR: 15 mg/dL — AB
Leukocytes, UA: NEGATIVE
Nitrite: NEGATIVE
PH: 5 (ref 5.0–8.0)
Protein, ur: NEGATIVE mg/dL
Specific Gravity, Urine: 1.015 (ref 1.005–1.030)
Urobilinogen, UA: 1 mg/dL (ref 0.0–1.0)

## 2014-02-27 LAB — CBC
HCT: 43.6 % (ref 39.0–52.0)
HCT: 42.9 % (ref 39.0–52.0)
HEMOGLOBIN: 14.7 g/dL (ref 13.0–17.0)
HEMOGLOBIN: 14.1 g/dL (ref 13.0–17.0)
MCH: 28.7 pg (ref 26.0–34.0)
MCH: 28.8 pg (ref 26.0–34.0)
MCHC: 33.7 g/dL (ref 30.0–36.0)
MCHC: 32.9 g/dL (ref 30.0–36.0)
MCV: 85 fL (ref 78.0–100.0)
MCV: 87.7 fL (ref 78.0–100.0)
Platelets: 317 10*3/uL (ref 150–400)
Platelets: 285 10*3/uL (ref 150–400)
RBC: 5.13 MIL/uL (ref 4.22–5.81)
RBC: 4.89 MIL/uL (ref 4.22–5.81)
RDW: 13.3 % (ref 11.5–15.5)
RDW: 13.7 % (ref 11.5–15.5)
WBC: 25.4 10*3/uL — ABNORMAL HIGH (ref 4.0–10.5)
WBC: 24.5 10*3/uL — ABNORMAL HIGH (ref 4.0–10.5)

## 2014-02-27 LAB — BASIC METABOLIC PANEL
ANION GAP: 15 (ref 5–15)
BUN: 23 mg/dL (ref 6–23)
CHLORIDE: 100 meq/L (ref 96–112)
CO2: 22 meq/L (ref 19–32)
CREATININE: 1.42 mg/dL — AB (ref 0.50–1.35)
Calcium: 8.9 mg/dL (ref 8.4–10.5)
GFR calc Af Amer: 55 mL/min — ABNORMAL LOW (ref >90)
GFR calc non Af Amer: 47 mL/min — ABNORMAL LOW (ref >90)
Glucose, Bld: 173 mg/dL — ABNORMAL HIGH (ref 70–99)
Potassium: 4.3 mEq/L (ref 3.7–5.3)
Sodium: 137 mEq/L (ref 137–147)

## 2014-02-27 LAB — PRO B NATRIURETIC PEPTIDE: Pro B Natriuretic peptide (BNP): 2847 pg/mL — ABNORMAL HIGH (ref 0–125)

## 2014-02-27 LAB — PROCALCITONIN: PROCALCITONIN: 1.87 ng/mL

## 2014-02-27 LAB — HEPARIN LEVEL (UNFRACTIONATED)
Heparin Unfractionated: 0.3 IU/mL (ref 0.30–0.70)
Heparin Unfractionated: 0.41 IU/mL (ref 0.30–0.70)

## 2014-02-27 LAB — GLUCOSE, CAPILLARY
Glucose-Capillary: 134 mg/dL — ABNORMAL HIGH (ref 70–99)
Glucose-Capillary: 122 mg/dL — ABNORMAL HIGH (ref 70–99)

## 2014-02-27 LAB — LACTIC ACID, PLASMA: Lactic Acid, Venous: 1.3 mmol/L (ref 0.5–2.2)

## 2014-02-27 MED ORDER — HEPARIN (PORCINE) IN NACL 100-0.45 UNIT/ML-% IJ SOLN
2450.0000 [IU]/h | INTRAMUSCULAR | Status: DC
  Administered 2014-02-27 – 2014-02-28 (×3): 1550 [IU]/h via INTRAVENOUS
  Administered 2014-03-01: 1950 [IU]/h via INTRAVENOUS
  Administered 2014-03-01 (×2): 1700 [IU]/h via INTRAVENOUS
  Administered 2014-03-02: 2150 [IU]/h via INTRAVENOUS
  Filled 2014-02-27 (×10): qty 250

## 2014-02-27 MED ORDER — PANTOPRAZOLE SODIUM 40 MG IV SOLR
40.0000 mg | INTRAVENOUS | Status: DC
  Administered 2014-02-28 – 2014-03-01 (×2): 40 mg via INTRAVENOUS
  Filled 2014-02-27 (×4): qty 40

## 2014-02-27 MED ORDER — SODIUM CHLORIDE 0.9 % IV SOLN
INTRAVENOUS | Status: AC
  Administered 2014-02-27 – 2014-02-28 (×3): via INTRAVENOUS
  Administered 2014-02-28: 150 mL/h via INTRAVENOUS

## 2014-02-27 MED ORDER — CHLORHEXIDINE GLUCONATE 0.12 % MT SOLN
15.0000 mL | Freq: Two times a day (BID) | OROMUCOSAL | Status: DC
  Administered 2014-02-27 – 2014-03-06 (×11): 15 mL via OROMUCOSAL
  Filled 2014-02-27 (×16): qty 15

## 2014-02-27 MED ORDER — DIAZEPAM 2 MG PO TABS
2.0000 mg | ORAL_TABLET | Freq: Two times a day (BID) | ORAL | Status: DC | PRN
  Administered 2014-02-28 – 2014-03-05 (×6): 2 mg via ORAL
  Filled 2014-02-27 (×7): qty 1

## 2014-02-27 MED ORDER — SODIUM CHLORIDE 0.9 % IV BOLUS (SEPSIS): 1000.0000 mL | INTRAVENOUS | Status: DC | PRN

## 2014-02-27 MED ORDER — SODIUM CHLORIDE 0.9 % IV BOLUS (SEPSIS)
500.0000 mL | Freq: Once | INTRAVENOUS | Status: AC
  Administered 2014-02-27: 500 mL via INTRAVENOUS

## 2014-02-27 MED ORDER — METOPROLOL TARTRATE 1 MG/ML IV SOLN
5.0000 mg | INTRAVENOUS | Status: DC | PRN
Start: 2014-02-27 — End: 2014-02-28

## 2014-02-27 MED ORDER — VANCOMYCIN HCL 10 G IV SOLR
1500.0000 mg | INTRAVENOUS | Status: DC
  Administered 2014-02-28: 1500 mg via INTRAVENOUS
  Filled 2014-02-27: qty 1500

## 2014-02-27 MED ORDER — CETYLPYRIDINIUM CHLORIDE 0.05 % MT LIQD
7.0000 mL | Freq: Two times a day (BID) | OROMUCOSAL | Status: DC
  Administered 2014-02-27 – 2014-03-05 (×11): 7 mL via OROMUCOSAL

## 2014-02-27 NOTE — Progress Notes (Signed)
Patient Demographics  Dustin Ashley, is a 73 y.o. male, DOB - 1941/03/16, JKD:[id number]  Admit date - 02/26/2014   Admitting Physician Theressa Millard, MD  Outpatient Primary MD for the patient is [name], MD  LOS - 1   Chief Complaint  Patient presents with  . Abdominal Pain        Subjective:   Dustin Ashley today has, No headache, No chest pain, ++ abdominal pain - No Nausea, No new weakness tingling or numbness, No Cough - SOB.    Assessment & Plan    1.Abd pain - Sepsis, recent Cholecystectomy - exam shows peritonitis - NPO, B cultures pending, IV Vanco and Zosyn, IVF bolus and maintenance, PCCM and CCS called, transfer to ICU.   2.Small PE - Hep gtt   3.? HCAP - no cough or SOB - cultures, IV ABX as above, NPO, o2 and Nebs as needed.   4.CAD. Status post CABG in the past, currently no acute chest issues, PRN IV B blocker.   5.ARF - due to #1, IVF.     Code Status: Full  Family Communication: wife  Disposition Plan: ICU   Procedures CT Abd-& CTA chest   Consults  CCS,PCCM   Medications  Scheduled Meds: . amLODipine  10 mg Oral Daily  . aspirin EC  81 mg Oral Daily  . atenolol  75 mg Oral Daily  . atorvastatin  20 mg Oral Daily  . benazepril  20 mg Oral BID  . loratadine  10 mg Oral Daily  . niacin  500 mg Oral QHS  . pantoprazole  40 mg Oral Daily  . piperacillin-tazobactam (ZOSYN)  IV  3.375 g Intravenous Q8H  . piperacillin-tazobactam  3.375 g Intravenous Once  . sodium chloride  500 mL Intravenous Once  . sodium chloride  3 mL Intravenous Q12H  . vancomycin  1,250 mg Intravenous Q12H   Continuous Infusions: . sodium chloride    . heparin 1,550 Units/hr (02/27/14 0338)   PRN Meds:.acetaminophen, acetaminophen, albuterol, alum & mag  hydroxide-simeth, diazepam, HYDROmorphone (DILAUDID) injection, ondansetron (ZOFRAN) IV, ondansetron, sodium chloride  DVT Prophylaxis    Heparin drip  Lab Results  Component Value Date   PLT 317 02/27/2014    Antibiotics     Anti-infectives   Start     Dose/Rate Route Frequency Ordered Stop   02/27/14 0400  piperacillin-tazobactam (ZOSYN) IVPB 3.375 g     3.375 g 12.5 mL/hr over 240 Minutes Intravenous Every 8 hours 02/26/14 2048     02/26/14 2100  vancomycin (VANCOCIN) 1,250 mg in sodium chloride 0.9 % 250 mL IVPB     1,250 mg 166.7 mL/hr over 90 Minutes Intravenous Every 12 hours 02/26/14 2048     02/26/14 2100  piperacillin-tazobactam (ZOSYN) IVPB 3.375 g     3.375 g 100 mL/hr over 30 Minutes Intravenous  Once 02/26/14 2048     02/26/14 1900  piperacillin-tazobactam (ZOSYN) IVPB 3.375 g     3.375 g 12.5 mL/hr over 240 Minutes Intravenous  Once 02/26/14 1856 02/26/14 1950   02/26/14 1900  vancomycin (VANCOCIN) IVPB 1000 mg/200 mL premix     1,000 mg 200 mL/hr over 60 Minutes Intravenous  Once 02/26/14 1856 02/26/14 2036  Objective:   Filed Vitals:   02/26/14 2030 02/26/14 2112 02/26/14 2114 02/27/14 0454  BP: 133/63 141/60  125/51  Pulse: 62 60  69  Temp:  98.2 F (36.8 C)  98.9 F (37.2 C)  TempSrc:  Oral  Axillary  Resp: 20 22  20   Height:  5\' 10"  (1.778 m)    Weight:  89.8 kg (197 lb 15.6 oz)    SpO2: 95% 90% 97% 93%    Wt Readings from Last 3 Encounters:  02/26/14 89.8 kg (197 lb 15.6 oz)  02/12/14 92.2 kg (203 lb 4.2 oz)  02/12/14 92.2 kg (203 lb 4.2 oz)     Intake/Output Summary (Last 24 hours) at 02/27/14 0748 Last data filed at 02/27/14 0600  Gross per 24 hour  Intake 608.75 ml  Output    230 ml  Net 378.75 ml     Physical Exam  Awake but looks letahrgic, No new F.N deficits, Normal affect [place].AT,PERRAL Supple Neck,No JVD, No cervical lymphadenopathy appriciated.  Symmetrical Chest wall movement, Good air movement bilaterally,  CTAB RRR,No Gallops,Rubs or new Murmurs, No Parasternal Heave Hypoactive B.Sounds, Abd diffusely tender with rebound and  Guarding. No Cyanosis, Clubbing or edema, No new Rash or bruise      Data Review   Micro Results No results found for this or any previous visit (from the past 240 hour(s)).  Radiology Reports Dg Chest 2 View  02/26/2014   CLINICAL DATA:  Fever.  EXAM: CHEST  2 VIEW  COMPARISON:  None.  FINDINGS: Lung volumes are low. There is some basilar atelectasis. No consolidative process, pneumothorax or effusion is identified. Heart size is normal. The patient is status post CABG.  IMPRESSION: No acute disease in a low volume chest.   Electronically Signed   By: Inge Rise M.D.   On: 02/26/2014 16:46   Dg Cholangiogram Operative  02/15/2014   CLINICAL DATA:  Abnormal biliary ejection fraction  EXAM: INTRAOPERATIVE CHOLANGIOGRAM  TECHNIQUE: Cholangiographic images from the C-arm fluoroscopic device were submitted for interpretation post-operatively. Please see the procedural report for the amount of contrast and the fluoroscopy time utilized.  COMPARISON:  Nuclear medicine HIDA scan 02/12/2014; prior abdominal ultrasound 02/12/2014  FINDINGS: Intraoperative staining and spot images demonstrate cannulation of the cystic duct remnant and opacification of the intra and extrahepatic biliary tree. No evidence of stenosis, stricture, filling defect or dilatation. Contrast material passes freely through the ampulla and into the duodenum. A nasogastric tube is present.  IMPRESSION: Negative intraoperative cholangiogram.   Electronically Signed   By: Jacqulynn Cadet M.D.   On: 02/15/2014 11:24   Ct Angio Chest Pe W/cm &/or Wo Cm  02/26/2014   CLINICAL DATA:  Upper abdominal pain, back pain, shoulder pain. Recent gallbladder surgery. Chest pain. Concern for pulmonary embolism  EXAM: CT ANGIOGRAPHY CHEST WITH CONTRAST  TECHNIQUE: Multidetector CT imaging of the chest was performed using the  standard protocol during bolus administration of intravenous contrast. Multiplanar CT image reconstructions and MIPs were obtained to evaluate the vascular anatomy.  CONTRAST:  175mL OMNIPAQUE IOHEXOL 300 MG/ML  SOLN  COMPARISON:  None.  FINDINGS: There is a than tubular filling defect within the central aspect of the left lower lobe pulmonary artery (image 71, series 601). Second wall adherent defect within an anterior left lower lobe pulmonary artery (image 63). No upper lobe pulmonary emboli or right lung pulmonary emboli.  No acute findings of the aorta great vessels. No pericardial fluid. Coronary calcifications noted. Patient status  post bypass graft surgery.  Review of the lung windows demonstrate bibasilar atelectasis and mild interstitial edema. There is mild airspace disease in the left lower lobe.  Review of the MIP images confirms the above findings.  IMPRESSION: 1. Small pulmonary emboli within a subsegmental left lower lobe pulmonary arteries. Overall clot burden is minimal. 2. Bibasilar atelectasis and concern for airspace disease at left lung base. This could represent pneumonia or aspiration pneumonitis. Small pulmonary infarction would also be in differential. Findings conveyed toVRINDA PICKERING on 02/26/2014  at18:39.   Electronically Signed   By: Suzy Bouchard M.D.   On: 02/26/2014 18:39   US Abdomen Complete  02/12/2014   CLINICAL DATA:  Abdominal pain.  EXAM: ULTRASOUND ABDOMEN COMPLETE  COMPARISON:  CT abdomen and pelvis 02/11/2014 and abdominal ultrasound 11/05/2010  FINDINGS: Gallbladder:  No gallstones or wall thickening visualized. No sonographic Murphy sign noted.  Common bile duct:  Diameter: 4 mm  Liver:  Suboptimal visualization of the left lobe. No focal abnormality identified.  IVC:  No abnormality visualized.  Pancreas:  Not visualized due to overlying bowel gas.  Spleen:  Size and appearance within normal limits.  Right Kidney:  Length: 11.2 cm. Echogenicity within normal  limits. No mass or hydronephrosis visualized.  Left Kidney:  Length: 11.8 cm. Echogenicity within normal limits. No mass or hydronephrosis visualized.  Abdominal aorta:  No aneurysm visualized.  Other findings:  None.  IMPRESSION: Limited visualization of midline structures due to bowel gas. No acute abnormality identified.   Electronically Signed   By: Logan Bores   On: 02/12/2014 08:56   Ct Abdomen Pelvis W Contrast  02/26/2014   CLINICAL DATA:  Upper abdominal pain, pain to back of shoulders, fell hot, diaphoretic, and "funny"; patient is post cholecystectomy 1 week ago  EXAM: CT ABDOMEN AND PELVIS WITH CONTRAST  TECHNIQUE: Multidetector CT imaging of the abdomen and pelvis was performed using the standard protocol following bolus administration of intravenous contrast. Sagittal and coronal MPR images reconstructed from axial data set. CTA chest reported separately.  CONTRAST:  137mL OMNIPAQUE IOHEXOL 300 MG/ML  SOLN IV  COMPARISON:  02/11/2014  FINDINGS: Bibasilar atelectasis.  Post median sternotomy and cholecystectomy.  Liver, spleen, pancreas, kidneys, and adrenal glands normal appearance.  Small postoperative fluid collection at gallbladder fossa, 3.2 x 2.1 cm image 19 containing no gas.  Small amount of free pelvic fluid dependently.  Normal appendix.  Unremarkable bladder and ureters.  Small hiatal hernia with stomach and small bowel loops otherwise unremarkable.  No mass, adenopathy, free air or acute bone lesion.  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  IMPRESSION: Small postoperative fluid collection at gallbladder fossa post cholecystectomy measuring 3.2 x 2.1 cm, nonspecific, could be sterile or infected.  Small amount of nonspecific free pelvic fluid.  No other intra-abdominal or intrapelvic abnormalities.   Electronically Signed   By: Lavonia Dana M.D.   On: 02/26/2014 18:32   Ct Abdomen Pelvis W Contrast  02/11/2014   CLINICAL DATA:  Upper abdominal pain since  Wednesday. Nausea and vomiting.  EXAM: CT ABDOMEN AND PELVIS WITH CONTRAST  TECHNIQUE: Multidetector CT imaging of the abdomen and pelvis was performed using the standard protocol following bolus administration of intravenous contrast.  CONTRAST:  183mL OMNIPAQUE IOHEXOL 300 MG/ML  SOLN  COMPARISON:  Ultrasound kidneys 11/05/2010  FINDINGS: Dependent changes in the lung bases. Small esophageal hiatal hernia. Postoperative changes in the mediastinum.  But there is evidence of minimal infiltration in the  fat around the head of the pancreas which, given the patient's symptoms could indicate early changes of acute focal pancreatitis. No loculated fluid collections are demonstrated in the pancreatic parenchymal enhancement is normal. Correlation with laboratory studies is recommended.  The liver, spleen, gallbladder, adrenal glands, kidneys, inferior vena cava, and retroperitoneal lymph nodes are unremarkable. Calcification of the abdominal aorta without aneurysm or dissection. Stomach, small bowel, and colon are normal for degree of distention. Scattered stool in the colon. No free air or free fluid in the abdomen. Small amount of fat in the umbilicus.  Pelvis: Appendix is normal. Scattered diverticula in the sigmoid colon without evidence of diverticulitis. Prostate gland is not enlarged. Bladder wall is not thickened. No free or loculated pelvic fluid collections. No pelvic mass or lymphadenopathy. 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 Degenerative changes in the lumbar spine. No destructive bone lesions appreciated.  IMPRESSION: Mild infiltration suggested around the head of the pancreas, possibly indicating early acute pancreatitis. Small esophageal hiatal hernia. 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   Electronically Signed   By: Lucienne Capers M.D.   On: 02/11/2014 22:30   Nm Hepato W/eject Fract  02/12/2014   CLINICAL DATA:  Abdominal pain  EXAM: NUCLEAR MEDICINE  HEPATOBILIARY IMAGING WITH GALLBLADDER EF  TECHNIQUE: Sequential images of the abdomen were obtained out to 60 minutes following intravenous administration of radiopharmaceutical. After slow intravenous infusion of Cholecystokinin, gallbladder ejection fraction was determined.  RADIOPHARMACEUTICALS:  [employer] [place] NO-67E Choletec  COMPARISON:  None.  FINDINGS: Adequate uptake is noted throughout the liver following injection. The biliary tree is noted at 10 min with gallbladder visualization at 15 min. Progressive filling of the gallbladder is noted.  Following injection with CCK the gallbladder ejection fraction is calculated at 6% which is well below the expected normal of greater than 30%.  The patient did experience symptoms during CCK infusion.  IMPRESSION: Normal uptake and excretion of biliary tracer.  Severely reduced ejection fraction at 6%.   Electronically Signed   By: Inez Catalina M.D.   On: 02/12/2014 14:41    CBC  Recent Labs Lab 02/26/14 1510 02/27/14 0130  WBC 18.0* 25.4*  HGB 14.0 14.7  HCT 41.8 43.6  PLT 272 317  MCV 84.4 85.0  MCH 28.3 28.7  MCHC 33.5 33.7  RDW 13.2 13.3  LYMPHSABS 1.0  --   MONOABS 1.4*  --   EOSABS 0.1  --   BASOSABS 0.0  --     Chemistries   Recent Labs Lab 02/26/14 1510 02/27/14 0130  NA 139 137  K 4.1 4.3  CL 103 100  CO2 22 22  GLUCOSE 129* 173*  BUN 19 23  CREATININE 1.16 1.42*  CALCIUM 9.0 8.9  AST 26  --   ALT 31  --   ALKPHOS 119*  --   BILITOT 0.7  --    ------------------------------------------------------------------------------------------------------------------ estimated creatinine clearance is 52.2 ml/min (by C-G formula based on Cr of 1.42). ------------------------------------------------------------------------------------------------------------------ No results found for this basename: HGBA1C,  in the last 72  hours ------------------------------------------------------------------------------------------------------------------ No results found for this basename: CHOL, HDL, LDLCALC, TRIG, CHOLHDL, LDLDIRECT,  in the last 72 hours ------------------------------------------------------------------------------------------------------------------ No results found for this basename: TSH, T4TOTAL, FREET3, T3FREE, THYROIDAB,  in the last 72 hours ------------------------------------------------------------------------------------------------------------------ No results found for this basename: VITAMINB12, FOLATE, FERRITIN, TIBC, IRON, RETICCTPCT,  in the last 72 hours  Coagulation profile No results found for this basename: INR, PROTIME,  in the last 168 hours  No results found for this basename: DDIMER,  in the last 72 hours  Cardiac Enzymes No results found for this basename: CK, CKMB, TROPONINI, MYOGLOBIN,  in the last 168 hours ------------------------------------------------------------------------------------------------------------------ No components found with this basename: POCBNP,      Time Spent in minutes   40   Lala Lund K M.D on 02/27/2014 at 7:48 AM  Between 7am to 7pm - Pager - [phone]  After 7pm go to [email or web address] - password [email or web address]  And look for the night coverage person covering for me after hours  [facility] Office  [phone]   **Disclaimer: This note may have been dictated with voice recognition software. Similar sounding words can inadvertently be transcribed and this note may contain transcription errors which may not have been corrected upon publication of note.**

## 2014-02-27 NOTE — Consult Note (Signed)
PULMONARY / CRITICAL CARE MEDICINE   Name: Dustin Ashley MRN: [id number] DOB: 09/03/1940    ADMISSION DATE:  02/26/2014 CONSULTATION DATE:  02/27/14  REFERRING MD :  TRH   CHIEF COMPLAINT:  Consulted for PE and possible sepsis w/ abd source   INITIAL PRESENTATION: 73 yo male admitted 8/15 with severe abd pain w/ recent cholecystectomy on 8/3 found to have PE on CT angio , CT abd pelvis showed small collection of fluid at gallbladder fossa w/ no gas. CCS consulted.  Pt was moved to ICU with concern for sepsis with peritonitis and PCCM was consulted on 8/16.   STUDIES:  8/15 CT angio >>Small pulmonary emboli within a subsegmental left lower lobe  pulmonary arteries. Overall clot burden is minimal. Bibasilar atx  At left lung base  8/15 CT abd/pelvis >>Small postoperative fluid collection at gallbladder fossa 3.2 x 2.1 cm,   Small amount of nonspecific free pelvic fluid. No other acute findings.    SIGNIFICANT EVENTS: 8/16 Transfer to ICU for concern of sepsis    HISTORY OF PRESENT ILLNESS:   Dustin Ashley is a 73 y.o. male with a recent cholecystectomy on 02/14/2014 who presented to ER on 8/15 with complaints of severe upper ABD Pain radiating into both shoulder  He began to develop fevers at home by the afternoon to 101.5, his wife gave him tylenol. In the ED, a CTA of the Chest revealed LLL pneumonia and small Pulmonary Emboli. He was placed on an IV Heparin drip and and coverage for HCAP with IV Vancomycin a dn Zosyn.  Pt complained of severe abd pain and CT abd pelvis showed Small postoperative fluid collection at gallbladder fossa 3.2 x 2.1 cm,   Small amount of nonspecific free pelvic fluid. No other acute findings.  UA not suggestive of UTI however required in/out cath due to retention. Pt complains of chronic urinary urgency ? BPH.  He was transferred to ICU am of 8/16 due worsening abd pain. B/p has remained good. Wbc has increased from 18k to 25k .  Denies vomitting but has  nausea. No diarrhea or constipation.  Lactate and BC are pending . Pt is fully awake in ICU , complains of diffuse abd pain and tenderness with mid abd to lower abd pain more on left. +guarding. Noted on exam. Denies sob . He denies calf pain or swelling.    PAST MEDICAL HISTORY :  Past Medical History  Diagnosis Date  . HOH (hard of hearing)     has a hearing aid L, sees audiology rountinely  . Allergic rhinitis   . CAD (coronary artery disease)   . GERD (gastroesophageal reflux disease)   . Hyperlipemia   . Hypertension   . Hyperglycemia     A1C 5.8 04-2010  . Pain, joint, multiple sites     uses valium, occ uses for insomnia. uses for shoulder  pain  . Chronic renal insufficiency, stage II (mild)     Cr ~ 1.4, u/s 4-12 normal kidneys  . 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 Personal history of colonic polyps    Past Surgical History  Procedure Laterality Date  . Coronary artery bypass graft  1995  . Colonoscopy  multiple  . Cholecystectomy N/A 02/15/2014    Procedure: LAPAROSCOPIC CHOLECYSTECTOMY with IOC;  Surgeon: Gayland Curry, MD;  Location: [facility];  Service: General;  Laterality: N/A;   Prior to Admission medications   Medication Sig Start Date End Date Taking? Authorizing Provider  amLODipine (NORVASC) 10 MG tablet Take 10 mg by mouth daily.   Yes Historical Provider, MD  aspirin 81 MG tablet Take 81 mg by mouth daily.     Yes Historical Provider, MD  atenolol (TENORMIN) 50 MG tablet Take 75 mg by mouth daily.   Yes Historical Provider, MD  atorvastatin (LIPITOR) 20 MG tablet Take 20 mg by mouth daily.    Yes Historical Provider, MD  benazepril (LOTENSIN) 20 MG tablet Take 20 mg by mouth 2 (two) times daily.   Yes Historical Provider, MD  diazepam (VALIUM) 10 MG tablet Take 10 mg by mouth every 12 (twelve) hours as needed for anxiety.   Yes Historical Provider, MD  esomeprazole (NEXIUM) 40 MG capsule Take 1 capsule (40 mg total) by mouth daily as needed. 06/08/13  Yes  Colon Branch, MD  fexofenadine (ALLEGRA) 180 MG tablet Take 180 mg by mouth daily.     Yes Historical Provider, MD  ketorolac (TORADOL) 10 MG tablet Take 1 tablet (10 mg total) by mouth every 8 (eight) hours as needed for severe pain. 02/16/14  Yes Hosie Poisson, MD  niacin (NIASPAN) 500 MG CR tablet Take 500 mg by mouth at bedtime.   Yes Historical Provider, MD   Allergies  Allergen Reactions  . Hydrocodone-Acetaminophen Other (See Comments)    REACTION: bladder obstruction  . Tramadol Hcl Other (See Comments)    REACTION: bladder obstruction    FAMILY HISTORY:  Family History  Problem Relation Age of Onset  . Hypertension Father   . Heart attack      2 brothers, CABG  . Cancer Neg Hx     colon, prostate  . Diabetes Neg Hx    SOCIAL HISTORY:  reports that he quit smoking about 37 years ago. He has never used smokeless tobacco. He reports that he does not drink alcohol or use illicit drugs.  REVIEW OF SYSTEMS:   Constitutional:   No  weight loss, night sweats,   +Fevers, chills, fatigue, or  lassitude.  HEENT:   No headaches,  Difficulty swallowing,  Tooth/dental problems, or  Sore throat,                No sneezing, itching, ear ache, nasal congestion, post nasal drip,   CV:  No chest pain,  Orthopnea, PND, swelling in lower extremities, anasarca, dizziness, palpitations, syncope.   GI  No heartburn, indigestion, ++abdominal pain, nausea,  No vomiting, diarrhea,  bloody stools.   Resp: No shortness of breath with exertion or at rest.  No excess mucus, no productive cough,  No non-productive cough,  No coughing up of blood.  No change in color of mucus.  No wheezing.  No chest wall deformity  Skin: no rash or lesions.  GU: no dysuria, change in color of urine, ++ urgency or frequency.  No flank pain, no hematuria   MS:  No joint pain or swelling.  No decreased range of motion.  No back pain.  Psych:  No change in mood or affect. No depression or anxiety.  No memory  loss.       SUBJECTIVE: abd pain   VITAL SIGNS: Temp:  [98.2 F (36.8 C)-98.9 F (37.2 C)] 98.3 F (36.8 C) (08/16 0759) Pulse Rate:  [60-71] 67 (08/16 0830) Resp:  [16-24] 18 (08/16 0830) BP: (119-159)/(47-66) 123/55 mmHg (08/16 0830) SpO2:  [90 %-100 %] 90 % (08/16 0830) Weight:  [89.8 kg (197 lb 15.6 oz)] 89.8 kg (197 lb 15.6 oz) (08/15  2112) HEMODYNAMICS:   VENTILATOR SETTINGS:   INTAKE / OUTPUT:  Intake/Output Summary (Last 24 hours) at 02/27/14 0835 Last data filed at 02/27/14 0600  Gross per 24 hour  Intake 608.75 ml  Output    230 ml  Net 378.75 ml    PHYSICAL EXAMINATION:  GEN: A/Ox3; appears uncomfortable  elderly , very HOH   HEENT:  [place]/AT,  EACs-clear, TMs-wnl, NOSE-clear, THROAT-clear, no lesions, no postnasal drip or exudate noted.   NECK:  Supple w/ fair ROM; no JVD; normal carotid impulses w/o bruits; no thyromegaly or nodules palpated; no lymphadenopathy.  RESP  Diminished BS in bases w/o wheezes/ rales/ or rhonchi.no accessory muscle use, no dullness to percussion  CARD:  RRR, no m/r/g  , no peripheral edema, pulses intact, no cyanosis or clubbing.  GI: Mild distention & gen tenderness hypoactive BS  no organomegaly or masses detected. + guarding , no rebound   Musco: Warm bil, no deformities or joint swelling noted.   Neuro: alert, no focal deficits noted.    Skin: Warm, no lesions or rashes   LABS:  CBC  Recent Labs Lab 02/26/14 1510 02/27/14 0130  WBC 18.0* 25.4*  HGB 14.0 14.7  HCT 41.8 43.6  PLT 272 317   Coag's No results found for this basename: APTT, INR,  in the last 168 hours BMET  Recent Labs Lab 02/26/14 1510 02/27/14 0130  NA 139 137  K 4.1 4.3  CL 103 100  CO2 22 22  BUN 19 23  CREATININE 1.16 1.42*  GLUCOSE 129* 173*   Electrolytes  Recent Labs Lab 02/26/14 1510 02/27/14 0130  CALCIUM 9.0 8.9   Sepsis Markers  Recent Labs Lab 02/26/14 1844  LATICACIDVEN 1.74   ABG No results found for  this basename: PHART, PCO2ART, PO2ART,  in the last 168 hours Liver Enzymes  Recent Labs Lab 02/26/14 1510  AST 26  ALT 31  ALKPHOS 119*  BILITOT 0.7  ALBUMIN 3.1*   Cardiac Enzymes No results found for this basename: TROPONINI, PROBNP,  in the last 168 hours Glucose  Recent Labs Lab 02/27/14 0812  GLUCAP 134*    Imaging Dg Chest 2 View  02/26/2014   CLINICAL DATA:  Fever.  EXAM: CHEST  2 VIEW  COMPARISON:  None.  FINDINGS: Lung volumes are low. There is some basilar atelectasis. No consolidative process, pneumothorax or effusion is identified. Heart size is normal. The patient is status post CABG.  IMPRESSION: No acute disease in a low volume chest.   Electronically Signed   By: Inge Rise M.D.   On: 02/26/2014 16:46   Ct Angio Chest Pe W/cm &/or Wo Cm  02/26/2014   CLINICAL DATA:  Upper abdominal pain, back pain, shoulder pain. Recent gallbladder surgery. Chest pain. Concern for pulmonary embolism  EXAM: CT ANGIOGRAPHY CHEST WITH CONTRAST  TECHNIQUE: Multidetector CT imaging of the chest was performed using the standard protocol during bolus administration of intravenous contrast. Multiplanar CT image reconstructions and MIPs were obtained to evaluate the vascular anatomy.  CONTRAST:  18mL OMNIPAQUE IOHEXOL 300 MG/ML  SOLN  COMPARISON:  None.  FINDINGS: There is a than tubular filling defect within the central aspect of the left lower lobe pulmonary artery (image 71, series 601). Second wall adherent defect within an anterior left lower lobe pulmonary artery (image 63). No upper lobe pulmonary emboli or right lung pulmonary emboli.  No acute findings of the aorta great vessels. No pericardial fluid. Coronary calcifications noted. Patient status post  bypass graft surgery.  Review of the lung windows demonstrate bibasilar atelectasis and mild interstitial edema. There is mild airspace disease in the left lower lobe.  Review of the MIP images confirms the above findings.  IMPRESSION:  1. Small pulmonary emboli within a subsegmental left lower lobe pulmonary arteries. Overall clot burden is minimal. 2. Bibasilar atelectasis and concern for airspace disease at left lung base. This could represent pneumonia or aspiration pneumonitis. Small pulmonary infarction would also be in differential. Findings conveyed toVRINDA PICKERING on 02/26/2014  at18:39.   Electronically Signed   By: Suzy Bouchard M.D.   On: 02/26/2014 18:39   Ct Abdomen Pelvis W Contrast  02/26/2014   CLINICAL DATA:  Upper abdominal pain, pain to back of shoulders, fell hot, diaphoretic, and "funny"; patient is post cholecystectomy 1 week ago  EXAM: CT ABDOMEN AND PELVIS WITH CONTRAST  TECHNIQUE: Multidetector CT imaging of the abdomen and pelvis was performed using the standard protocol following bolus administration of intravenous contrast. Sagittal and coronal MPR images reconstructed from axial data set. CTA chest reported separately.  CONTRAST:  179mL OMNIPAQUE IOHEXOL 300 MG/ML  SOLN IV  COMPARISON:  02/11/2014  FINDINGS: Bibasilar atelectasis.  Post median sternotomy and cholecystectomy.  Liver, spleen, pancreas, kidneys, and adrenal glands normal appearance.  Small postoperative fluid collection at gallbladder fossa, 3.2 x 2.1 cm image 19 containing no gas.  Small amount of free pelvic fluid dependently.  Normal appendix.  Unremarkable bladder and ureters.  Small hiatal hernia with stomach and small bowel loops otherwise unremarkable.  No mass, adenopathy, free air or acute bone lesion.  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  IMPRESSION: Small postoperative fluid collection at gallbladder fossa post cholecystectomy measuring 3.2 x 2.1 cm, nonspecific, could be sterile or infected.  Small amount of nonspecific free pelvic fluid.  No other intra-abdominal or intrapelvic abnormalities.   Electronically Signed   By: Lavonia Dana M.D.   On: 02/26/2014 18:32     ASSESSMENT /  PLAN:  PULMONARY  A:Acute PE >8/15 CTA w/ small emboli in LLL, min clot burden  Appears to be provoked w/ recent surgery  Bibasilar atx vs HCAP LLL -fever and elevated wbc on admit in absence of cough/congestion   P:   Continue Heparin drip  Titrate O2 to keep sats >90% Continue IV abx for now  Repeat cXR in am      CARDIOVASCULAR A: HTN >b/p and HR are stable without signs of shock and no signs of right sided failure , neg troponin   P:  AntiHTN rx on hold for now  BB As needed  If needed for HR or b/p  Hold on 2 echo for now  Cont IVF    RENAL A: Acute on Chronic Renal Failure (baseline scr 1.12) possibly on dry side  Renal Artery stenosis   P:   Fluid challenge  Repeat bmet in am    GASTROINTESTINAL A:  Severe Abd pain s/p cholecystectomy 8/3 ? Etiology  8/15 CT abd /pelvis w/ small collection of fluid in GB fossa w/ no gas noted, no other acute issues , LFT nml ,lipase nml , UA ok   P:   CCS consult  NPO  Pain rx As needed     HEMATOLOGIC A: Leukocytosis  P:  Monitor h/h closely as on Hep    INFECTIOUS A:  Fever and elevated WBC ? SIRS process  ?HCAP  ? Abd source   P:   8/15 BCx2 >>  Abx: Zosyn , start date 8/15, day 2/  Vanc 8/15 , day 2 /  Check LA and PCT   ENDOCRINE A:  Hyperglycemia  P:   Add SSI if BS >200   NEUROLOGIC A:  Alert  Pain   P:   Pain meds as needed, try to avoid oversedation   TODAY'S SUMMARY: 73 yo male admitted 8/15 by TRH transferred to ICU for PE and  possible sepsis w/ HCAP and ? abd source with recent cholecystectomy . B/p is stable ,not on pressors, started on abx and b/p meds on hold , fluid challenge given .   I have personally obtained a history, examined the patient, evaluated laboratory and imaging results, formulated the assessment and plan and placed orders. CRITICAL CARE: The patient is critically ill with multiple organ systems failure and requires high complexity decision making for assessment and  support, frequent evaluation and titration of therapies, application of advanced monitoring technologies and extensive interpretation of multiple databases. Critical Care Time devoted to patient care services described in this note is  minutes.   PARRETT,TAMMY NP-C  Pulmonary and [place] Pager: [phone]   PCCM ATTENDING: I have interviewed and examined the patient and reviewed the database. I have formulated the assessment and plan as reflected in the note above with amendments made by me.   Merton Border, MD;  PCCM service; Mobile [phone]  02/27/2014, 8:35 AM

## 2014-02-27 NOTE — Progress Notes (Signed)
Utilization Review Completed.Dustin Ashley T8/16/2015  

## 2014-02-27 NOTE — Progress Notes (Signed)
Pt continues to c/o pain to lower abd. and bladder area, has been unable to void, pt reports since this am, pt and family requesting in/out cath, bladder scanned for 354 ml. MD notified, given results of CT abd/pelvis scan and bladder scan , instructed to I/O cath pt per his request

## 2014-02-27 NOTE — Progress Notes (Signed)
Report given to the ICU nurse on 63M.   Last set of VS. .BP 119/53  Pulse 67  Temp(Src) 98.3 F (36.8 C) (Oral)  Resp 20  Ht 5\' 10"  (1.778 m)  Wt 89.8 kg (197 lb 15.6 oz)  BMI 28.41 kg/m2  SpO2 93%

## 2014-02-27 NOTE — Progress Notes (Addendum)
[facility] for Heparin Indication: pulmonary embolus  Allergies  Allergen Reactions  . Hydrocodone-Acetaminophen Other (See Comments)    REACTION: bladder obstruction  . Tramadol Hcl Other (See Comments)    REACTION: bladder obstruction    Patient Measurements: Height: 5\' 10"  (177.8 cm) Weight: 196 lb 6.9 oz (89.1 kg) IBW/kg (Calculated) : 73 Height: 70 inches Weight: 92.2 kg IBW: 73 kg Heparin Dosing Weight: 92 kg  Vital Signs: Temp: 98.3 F (36.8 C) (08/16 0759) Temp src: Oral (08/16 0759) BP: 91/50 mmHg (08/16 1000) Pulse Rate: 67 (08/16 1000)  Labs:  Recent Labs  02/26/14 1510 02/27/14 0130 02/27/14 1033  HGB 14.0 14.7 14.1  HCT 41.8 43.6 42.9  PLT 272 317 285  HEPARINUNFRC  --  0.30 0.41  CREATININE 1.16 1.42*  --    CrCl ~ 88ml/min  Estimated Creatinine Clearance: 52 ml/min (by C-G formula based on Cr of 1.42).   Medical History: Past Medical History  Diagnosis Date  . HOH (hard of hearing)     has a hearing aid L, sees audiology rountinely  . Allergic rhinitis   . CAD (coronary artery disease)   . GERD (gastroesophageal reflux disease)   . Hyperlipemia   . Hypertension   . Hyperglycemia     A1C 5.8 04-2010  . Pain, joint, multiple sites     uses valium, occ uses for insomnia. uses for shoulder  pain  . Chronic renal insufficiency, stage II (mild)     Cr ~ 1.4, u/s 4-12 normal kidneys  . 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 Personal history of colonic polyps     Medications:  ASA, Amlodipine, Atenolol, Lipitor, Benazepril, Allegra, Niacin, Ketorolac PRN  Assessment: 73 yo M presented to ED with abdominal pain that radiated to his back and shoulders.  Pt also reported T101.5 and diaphoresis.  Pt is s/p lap chole on 02/15/14.   CT scan showed small pulmonary emboli within the left lower lobe pulmonary arteries.  Baseline CBC wnl.  No bleeding hx noted.  Heparin level is now therapeutic x2.  CCS also  said it was ok to dc abx. CCM to decide. Will change vanc slightly in the mean time.   Goal of Therapy:  Heparin level 0.3-0.7 units/ml Monitor platelets by anticoagulation protocol: Yes   Plan:   Heparin 1550 units/hr. Heparin level and CBC daily while on heparin. Change vanc to 1.5g IV q24

## 2014-02-27 NOTE — Consult Note (Addendum)
I have seen and examined the pt and agree with PA-Jenning's note. Pt is s/p lap chole with acute PE.  L sided abd pain similar to preop state (prior to lap chole). CT with fluid in the gallbladder fossa with some fluid within the pelvis, with no subsequent inflammation of the abdomen.  No rim enhancing collections. Fluid is likely normal post op fluid from surgery.  Leukocytosis likely related to PE, no abx necessary from surgical standpoint. No surgical intervention needed.  Call with questions

## 2014-02-27 NOTE — Progress Notes (Signed)
[place] for Heparin Indication: pulmonary embolus  Allergies  Allergen Reactions  . Hydrocodone-Acetaminophen Other (See Comments)    REACTION: bladder obstruction  . Tramadol Hcl Other (See Comments)    REACTION: bladder obstruction    Patient Measurements: Height: 5\' 10"  (177.8 cm) Weight: 197 lb 15.6 oz (89.8 kg) IBW/kg (Calculated) : 73 Height: 70 inches Weight: 92.2 kg IBW: 73 kg Heparin Dosing Weight: 92 kg  Vital Signs: Temp: 98.2 F (36.8 C) (08/15 2112) Temp src: Oral (08/15 2112) BP: 141/60 mmHg (08/15 2112) Pulse Rate: 60 (08/15 2112)  Labs:  Recent Labs  02/26/14 1510 02/27/14 0130  HGB 14.0 14.7  HCT 41.8 43.6  PLT 272 317  HEPARINUNFRC  --  0.30  CREATININE 1.16 1.42*   CrCl ~ 33ml/min  Estimated Creatinine Clearance: 52.2 ml/min (by C-G formula based on Cr of 1.42).   Medical History: Past Medical History  Diagnosis Date  . HOH (hard of hearing)     has a hearing aid L, sees audiology rountinely  . Allergic rhinitis   . CAD (coronary artery disease)   . GERD (gastroesophageal reflux disease)   . Hyperlipemia   . Hypertension   . Hyperglycemia     A1C 5.8 04-2010  . Pain, joint, multiple sites     uses valium, occ uses for insomnia. uses for shoulder  pain  . Chronic renal insufficiency, stage II (mild)     Cr ~ 1.4, u/s 4-12 normal kidneys  . 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 Personal history of colonic polyps     Medications:  ASA, Amlodipine, Atenolol, Lipitor, Benazepril, Allegra, Niacin, Ketorolac PRN  Assessment: 73 yo M presented to ED with abdominal pain that radiated to his back and shoulders.  Pt also reported T101.5 and diaphoresis.  Pt is s/p lap chole on 02/15/14.   CT scan showed small pulmonary emboli within the left lower lobe pulmonary arteries.  Baseline CBC wnl.  No bleeding hx noted.  Initial heparin level at low end of goal(0.3) on 1400 units/hr. No bleeding or IV  issues noted. Given PE will make rate adjustment to keep within desired range.  Goal of Therapy:  Heparin level 0.3-0.7 units/ml Monitor platelets by anticoagulation protocol: Yes   Plan:  Increase Heparin infusion to 1550 units/hr. Heparin level in 6 hours. Heparin level and CBC daily while on heparin.  Erin Hearing PharmD., BCPS Clinical Pharmacist Pager [phone] 02/27/2014 2:59 AM

## 2014-02-27 NOTE — ED Provider Notes (Signed)
Medical screening examination/treatment/procedure(s) were conducted as a shared visit with non-physician practitioner(s) and myself.  I personally evaluated the patient during the encounter.   EKG Interpretation   Date/Time:  Saturday February 26 2014 16:36:47 EDT Ventricular Rate:  61 PR Interval:  270 QRS Duration: 92 QT Interval:  484 QTC Calculation: 488 R Axis:   53 Text Interpretation:  Age not entered, assumed to be  73 years old for  purpose of ECG interpretation Sinus rhythm Prolonged PR interval Abnormal  T, consider ischemia, anterior leads No significant change since last  tracing Confirmed by Zenia Resides  MD, Thuy Atilano ([id number]) on 02/26/2014 6:53:00 PM       Leota Jacobsen, MD 02/27/14 1300

## 2014-02-27 NOTE — Consult Note (Signed)
Reason for Consult:abdominal pain Referring Physician: Dr. Ronnie Derby  Dustin Ashley is an 73 y.o. male.  HPI: 73 y/o s/p cholecystectomy by Dr. Redmond Pulling on 02/15/14.  He presents now with waking up and complaining of abdominal pain going to his back and shoulders.  He reports a temp of 101 at home.  Some nausea at home.  He was fine yesterday, had a Cheese Dog for supper, regular Bm yesterday.  He was admitted with elevated WBC 18K/left shift.  CXR show no acute changes.  CT of the abdomen shows a 3.2 x 2.1 fluid collection gallbladder fossa. No acute changes.  CT scan shows small PE in the LLL. And some concern for pneumonia/pneumonitis.  We are ask to see with pt having rising creatinine, rising WBC and acute abdominal pain.  Currently the pain is on the left side and primarily the LLQ.  Past Medical History  Diagnosis Date  . HOH (hard of hearing)     has a hearing aid L, sees audiology rountinely  . Allergic rhinitis   . CAD (coronary artery disease)   . GERD (gastroesophageal reflux disease)   . Hyperlipemia   . Hypertension   . Hyperglycemia     A1C 5.8 04-2010  . Pain, joint, multiple sites     uses valium, occ uses for insomnia. uses for shoulder  pain  . Chronic renal insufficiency, stage II (mild)     Cr ~ 1.4, u/s 4-12 normal kidneys  . 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 Personal history of colonic polyps     Past Surgical History  Procedure Laterality Date  . Coronary artery bypass graft  1995  . Colonoscopy  multiple  . Cholecystectomy N/A 02/15/2014    Procedure: LAPAROSCOPIC CHOLECYSTECTOMY with IOC;  Surgeon: Gayland Curry, MD;  Location: [facility] OR;  Service: General;  Laterality: N/A;    Family History  Problem Relation Age of Onset  . Hypertension Father   . Heart attack      2 brothers, CABG  . Cancer Neg Hx     colon, prostate  . Diabetes Neg Hx     Social History:  reports that he quit smoking about 37 years ago. He has never used smokeless tobacco. He  reports that he does not drink alcohol or use illicit drugs.  Allergies:  Allergies  Allergen Reactions  . Hydrocodone-Acetaminophen Other (See Comments)    REACTION: bladder obstruction  . Tramadol Hcl Other (See Comments)    REACTION: bladder obstruction    Medications:  Prior to Admission:  Prescriptions prior to admission  Medication Sig Dispense Refill  . amLODipine (NORVASC) 10 MG tablet Take 10 mg by mouth daily.      Marland Kitchen aspirin 81 MG tablet Take 81 mg by mouth daily.        Marland Kitchen atenolol (TENORMIN) 50 MG tablet Take 75 mg by mouth daily.      Marland Kitchen atorvastatin (LIPITOR) 20 MG tablet Take 20 mg by mouth daily.       . benazepril (LOTENSIN) 20 MG tablet Take 20 mg by mouth 2 (two) times daily.      . diazepam (VALIUM) 10 MG tablet Take 10 mg by mouth every 12 (twelve) hours as needed for anxiety.      Marland Kitchen esomeprazole (NEXIUM) 40 MG capsule Take 1 capsule (40 mg total) by mouth daily as needed.  90 capsule  1  . fexofenadine (ALLEGRA) 180 MG tablet Take 180 mg by mouth daily.        Marland Kitchen  ketorolac (TORADOL) 10 MG tablet Take 1 tablet (10 mg total) by mouth every 8 (eight) hours as needed for severe pain.  10 tablet  0  . niacin (NIASPAN) 500 MG CR tablet Take 500 mg by mouth at bedtime.       Scheduled: . loratadine  10 mg Oral Daily  . pantoprazole  40 mg Oral Daily  . piperacillin-tazobactam (ZOSYN)  IV  3.375 g Intravenous Q8H  . piperacillin-tazobactam  3.375 g Intravenous Once  . sodium chloride  3 mL Intravenous Q12H  . vancomycin  1,250 mg Intravenous Q12H   Continuous: . sodium chloride 125 mL/hr at 02/27/14 0815  . heparin 1,550 Units/hr (02/27/14 0338)   XTK:[id number], albuterol, alum & mag hydroxide-simeth, diazepam, HYDROmorphone (DILAUDID) injection, metoprolol, ondansetron (ZOFRAN) IV, sodium chloride Anti-infectives   Start     Dose/Rate Route Frequency Ordered Stop   02/27/14 0400  piperacillin-tazobactam (ZOSYN) IVPB 3.375 g     3.375 g 12.5 mL/hr over 240  Minutes Intravenous Every 8 hours 02/26/14 2048     02/26/14 2100  vancomycin (VANCOCIN) 1,250 mg in sodium chloride 0.9 % 250 mL IVPB     1,250 mg 166.7 mL/hr over 90 Minutes Intravenous Every 12 hours 02/26/14 2048     02/26/14 2100  piperacillin-tazobactam (ZOSYN) IVPB 3.375 g     3.375 g 100 mL/hr over 30 Minutes Intravenous  Once 02/26/14 2048     02/26/14 1900  piperacillin-tazobactam (ZOSYN) IVPB 3.375 g     3.375 g 12.5 mL/hr over 240 Minutes Intravenous  Once 02/26/14 1856 02/26/14 1950   02/26/14 1900  vancomycin (VANCOCIN) IVPB 1000 mg/200 mL premix     1,000 mg 200 mL/hr over 60 Minutes Intravenous  Once 02/26/14 1856 02/26/14 2036      Results for orders placed during the hospital encounter of 02/26/14 (from the past 48 hour(s))  CBC WITH DIFFERENTIAL     Status: Abnormal   Collection Time    02/26/14  3:10 PM      Result Value Ref Range   WBC 18.0 (*) 4.0 - 10.5 K/uL   RBC 4.95  4.22 - 5.81 MIL/uL   Hemoglobin 14.0  13.0 - 17.0 g/dL   HCT 41.8  39.0 - 52.0 %   MCV 84.4  78.0 - 100.0 fL   MCH 28.3  26.0 - 34.0 pg   MCHC 33.5  30.0 - 36.0 g/dL   RDW 13.2  11.5 - 15.5 %   Platelets 272  150 - 400 K/uL   Neutrophils Relative % 86 (*) 43 - 77 %   Neutro Abs 15.5 (*) 1.7 - 7.7 K/uL   Lymphocytes Relative 6 (*) 12 - 46 %   Lymphs Abs 1.0  0.7 - 4.0 K/uL   Monocytes Relative 8  3 - 12 %   Monocytes Absolute 1.4 (*) 0.1 - 1.0 K/uL   Eosinophils Relative 0  0 - 5 %   Eosinophils Absolute 0.1  0.0 - 0.7 K/uL   Basophils Relative 0  0 - 1 %   Basophils Absolute 0.0  0.0 - 0.1 K/uL  COMPREHENSIVE METABOLIC PANEL     Status: Abnormal   Collection Time    02/26/14  3:10 PM      Result Value Ref Range   Sodium 139  137 - 147 mEq/L   Potassium 4.1  3.7 - 5.3 mEq/L   Chloride 103  96 - 112 mEq/L   CO2 22  19 - 32 mEq/L  Glucose, Bld 129 (*) 70 - 99 mg/dL   BUN 19  6 - 23 mg/dL   Creatinine, Ser 1.16  0.50 - 1.35 mg/dL   Calcium 9.0  8.4 - 10.5 mg/dL   Total Protein  6.7  6.0 - 8.3 g/dL   Albumin 3.1 (*) 3.5 - 5.2 g/dL   AST 26  0 - 37 U/L   ALT 31  0 - 53 U/L   Alkaline Phosphatase 119 (*) 39 - 117 U/L   Total Bilirubin 0.7  0.3 - 1.2 mg/dL   GFR calc non Af Amer 61 (*) >90 mL/min   GFR calc Af Amer 70 (*) >90 mL/min   Comment: (NOTE)     The eGFR has been calculated using the CKD EPI equation.     This calculation has not been validated in all clinical situations.     eGFR's persistently <90 mL/min signify possible Chronic Kidney     Disease.   Anion gap 14  5 - 15  LIPASE, BLOOD     Status: None   Collection Time    02/26/14  3:10 PM      Result Value Ref Range   Lipase 27  11 - 59 U/L  I-STAT TROPOININ, ED     Status: None   Collection Time    02/26/14  3:26 PM      Result Value Ref Range   Troponin i, poc 0.01  0.00 - 0.08 ng/mL   Comment 3            Comment: Due to the release kinetics of cTnI,     a negative result within the first hours     of the onset of symptoms does not rule out     myocardial infarction with certainty.     If myocardial infarction is still suspected,     repeat the test at appropriate intervals.  I-STAT CG4 LACTIC ACID, ED     Status: None   Collection Time    02/26/14  6:44 PM      Result Value Ref Range   Lactic Acid, Venous 1.74  0.5 - 2.2 mmol/L  URINALYSIS, ROUTINE W REFLEX MICROSCOPIC     Status: Abnormal   Collection Time    02/27/14  1:29 AM      Result Value Ref Range   Color, Urine AMBER (*) YELLOW   Comment: BIOCHEMICALS MAY BE AFFECTED BY COLOR   APPearance CLEAR  CLEAR   Specific Gravity, Urine 1.015  1.005 - 1.030   pH 5.0  5.0 - 8.0   Glucose, UA NEGATIVE  NEGATIVE mg/dL   Hgb urine dipstick NEGATIVE  NEGATIVE   Bilirubin Urine NEGATIVE  NEGATIVE   Ketones, ur 15 (*) NEGATIVE mg/dL   Protein, ur NEGATIVE  NEGATIVE mg/dL   Urobilinogen, UA 1.0  0.0 - 1.0 mg/dL   Nitrite NEGATIVE  NEGATIVE   Leukocytes, UA NEGATIVE  NEGATIVE   Comment: MICROSCOPIC NOT DONE ON URINES WITH NEGATIVE  PROTEIN, BLOOD, LEUKOCYTES, NITRITE, OR GLUCOSE <1000 mg/dL.  HEPARIN LEVEL (UNFRACTIONATED)     Status: None   Collection Time    02/27/14  1:30 AM      Result Value Ref Range   Heparin Unfractionated 0.30  0.30 - 0.70 IU/mL   Comment:            IF HEPARIN RESULTS ARE BELOW     EXPECTED VALUES, AND PATIENT     DOSAGE HAS BEEN CONFIRMED,  SUGGEST FOLLOW UP TESTING     OF ANTITHROMBIN III LEVELS.  CBC     Status: Abnormal   Collection Time    02/27/14  1:30 AM      Result Value Ref Range   WBC 25.4 (*) 4.0 - 10.5 K/uL   RBC 5.13  4.22 - 5.81 MIL/uL   Hemoglobin 14.7  13.0 - 17.0 g/dL   HCT 43.6  39.0 - 52.0 %   MCV 85.0  78.0 - 100.0 fL   MCH 28.7  26.0 - 34.0 pg   MCHC 33.7  30.0 - 36.0 g/dL   RDW 13.3  11.5 - 15.5 %   Platelets 317  150 - 400 K/uL  BASIC METABOLIC PANEL     Status: Abnormal   Collection Time    02/27/14  1:30 AM      Result Value Ref Range   Sodium 137  137 - 147 mEq/L   Potassium 4.3  3.7 - 5.3 mEq/L   Chloride 100  96 - 112 mEq/L   CO2 22  19 - 32 mEq/L   Glucose, Bld 173 (*) 70 - 99 mg/dL   BUN 23  6 - 23 mg/dL   Creatinine, Ser 1.42 (*) 0.50 - 1.35 mg/dL   Calcium 8.9  8.4 - 10.5 mg/dL   GFR calc non Af Amer 47 (*) >90 mL/min   GFR calc Af Amer 55 (*) >90 mL/min   Comment: (NOTE)     The eGFR has been calculated using the CKD EPI equation.     This calculation has not been validated in all clinical situations.     eGFR's persistently <90 mL/min signify possible Chronic Kidney     Disease.   Anion gap 15  5 - 15  GLUCOSE, CAPILLARY     Status: Abnormal   Collection Time    02/27/14  8:12 AM      Result Value Ref Range   Glucose-Capillary 134 (*) 70 - 99 mg/dL    Dg Chest 2 View  02/26/2014   CLINICAL DATA:  Fever.  EXAM: CHEST  2 VIEW  COMPARISON:  None.  FINDINGS: Lung volumes are low. There is some basilar atelectasis. No consolidative process, pneumothorax or effusion is identified. Heart size is normal. The patient is status post CABG.   IMPRESSION: No acute disease in a low volume chest.   Electronically Signed   By: Inge Rise M.D.   On: 02/26/2014 16:46   Ct Angio Chest Pe W/cm &/or Wo Cm  02/26/2014   CLINICAL DATA:  Upper abdominal pain, back pain, shoulder pain. Recent gallbladder surgery. Chest pain. Concern for pulmonary embolism  EXAM: CT ANGIOGRAPHY CHEST WITH CONTRAST  TECHNIQUE: Multidetector CT imaging of the chest was performed using the standard protocol during bolus administration of intravenous contrast. Multiplanar CT image reconstructions and MIPs were obtained to evaluate the vascular anatomy.  CONTRAST:  181m OMNIPAQUE IOHEXOL 300 MG/ML  SOLN  COMPARISON:  None.  FINDINGS: There is a than tubular filling defect within the central aspect of the left lower lobe pulmonary artery (image 71, series 601). Second wall adherent defect within an anterior left lower lobe pulmonary artery (image 63). No upper lobe pulmonary emboli or right lung pulmonary emboli.  No acute findings of the aorta great vessels. No pericardial fluid. Coronary calcifications noted. Patient status post bypass graft surgery.  Review of the lung windows demonstrate bibasilar atelectasis and mild interstitial edema. There is mild airspace disease in the left  lower lobe.  Review of the MIP images confirms the above findings.  IMPRESSION: 1. Small pulmonary emboli within a subsegmental left lower lobe pulmonary arteries. Overall clot burden is minimal. 2. Bibasilar atelectasis and concern for airspace disease at left lung base. This could represent pneumonia or aspiration pneumonitis. Small pulmonary infarction would also be in differential. Findings conveyed toVRINDA PICKERING on 02/26/2014  at18:39.   Electronically Signed   By: Suzy Bouchard M.D.   On: 02/26/2014 18:39   Ct Abdomen Pelvis W Contrast  02/26/2014   CLINICAL DATA:  Upper abdominal pain, pain to back of shoulders, fell hot, diaphoretic, and "funny"; patient is post cholecystectomy 1  week ago  EXAM: CT ABDOMEN AND PELVIS WITH CONTRAST  TECHNIQUE: Multidetector CT imaging of the abdomen and pelvis was performed using the standard protocol following bolus administration of intravenous contrast. Sagittal and coronal MPR images reconstructed from axial data set. CTA chest reported separately.  CONTRAST:  144m OMNIPAQUE IOHEXOL 300 MG/ML  SOLN IV  COMPARISON:  02/11/2014  FINDINGS: Bibasilar atelectasis.  Post median sternotomy and cholecystectomy.  Liver, spleen, pancreas, kidneys, and adrenal glands normal appearance.  Small postoperative fluid collection at gallbladder fossa, 3.2 x 2.1 cm image 19 containing no gas.  Small amount of free pelvic fluid dependently.  Normal appendix.  Unremarkable bladder and ureters.  Small hiatal hernia with stomach and small bowel loops otherwise unremarkable.  No mass, adenopathy, free air or acute bone lesion.  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  IMPRESSION: Small postoperative fluid collection at gallbladder fossa post cholecystectomy measuring 3.2 x 2.1 cm, nonspecific, could be sterile or infected.  Small amount of nonspecific free pelvic fluid.  No other intra-abdominal or intrapelvic abnormalities.   Electronically Signed   By: MLavonia DanaM.D.   On: 02/26/2014 18:32    Review of Systems  Constitutional: Positive for fever. Negative for chills, weight loss, malaise/fatigue and diaphoresis.  HENT: Negative.   Eyes: Negative.   Respiratory: Negative.   Cardiovascular: Positive for claudication and PND. Negative for chest pain, palpitations, orthopnea and leg swelling.       Pain in abdomen going to back and shoulders  Gastrointestinal: Positive for heartburn, nausea and abdominal pain (he described generalized abdominal pain going to back and shoulders.  Now his pain is in Left side mostly LLQ). Negative for vomiting, diarrhea, constipation, blood in stool and melena.       Has a cheese dog last pm for supper,  last Bm yesterday and normal. Prior colonoscopy pt reports no diverticulosis.  Genitourinary: Negative.        Slow/bph He got i/o cath on floor.  Musculoskeletal: Positive for back pain.  Skin: Negative.   Neurological: Negative.  Negative for weakness.  Endo/Heme/Allergies: Negative.   Psychiatric/Behavioral: Negative.    Blood pressure 103/47, pulse 68, temperature 98.3 F (36.8 C), temperature source Oral, resp. rate 20, height 5' 10" (1.778 m), weight 89.1 kg (196 lb 6.9 oz), SpO2 94.00%. Physical Exam  Constitutional: He is oriented to person, place, and time. He appears well-developed and well-nourished. No distress.  HENT:  Head: Normocephalic and atraumatic.  Nose: Nose normal.  Eyes: Conjunctivae and EOM are normal. Pupils are equal, round, and reactive to light. Right eye exhibits no discharge. Left eye exhibits no discharge. No scleral icterus.  Neck: Normal range of motion. Neck supple. No JVD present. No tracheal deviation present. No thyromegaly present.  Cardiovascular: Normal rate, regular rhythm, normal heart sounds and intact  distal pulses.  Exam reveals no gallop.   No murmur heard. Respiratory: Effort normal and breath sounds normal. No respiratory distress. He has no wheezes. He has no rales. He exhibits no tenderness.  S/p CABG at age 64  GI: Soft. Bowel sounds are normal. He exhibits no distension and no mass. There is tenderness (some tenderness left side most of it is in the LLQ). There is no rebound and no guarding.  Port sites are fine, no pain in the RUQ even with deep palpation.  Musculoskeletal: He exhibits no edema.  Lymphadenopathy:    He has no cervical adenopathy.  Neurological: He is alert and oriented to person, place, and time. No cranial nerve deficit.  Skin: Skin is warm and dry. No rash noted. He is not diaphoretic. No erythema. No pallor.  Psychiatric: He has a normal mood and affect. His behavior is normal. Judgment and thought content normal.     Assessment/Plan: 1.  Acute onset of abdominal pain, back and shoulder pain with PE found on CT scan. 2.  Bibasilar atelectasis, possible pneumonia 3.  S/p cholecystectomy with small fluid collection in the GB fossa that is normal.  New leukocytosis.  No history of diverticulosis 4.  CAD 5.  Hypertension 6.  Hyperglycemia 7.  Renal insuffiencey/hx of RAS  Plan:  Dr. Rosendo Gros will see, but at this time I don't think this is related to his cholecystectomy.    Jillien Yakel 02/27/2014, 10:05 AM

## 2014-02-27 NOTE — Progress Notes (Signed)
UA not obtained at time of foley insertion as UA was collected at [facility] on 02/27/14  Richardean Canal RN, BSN, CCRN

## 2014-02-28 ENCOUNTER — Inpatient Hospital Stay (HOSPITAL_COMMUNITY): Payer: Medicare Other

## 2014-02-28 DIAGNOSIS — R1013 Epigastric pain: Secondary | ICD-10-CM

## 2014-02-28 DIAGNOSIS — R52 Pain, unspecified: Secondary | ICD-10-CM

## 2014-02-28 DIAGNOSIS — L851 Acquired keratosis [keratoderma] palmaris et plantaris: Secondary | ICD-10-CM

## 2014-02-28 LAB — COMPREHENSIVE METABOLIC PANEL
ALBUMIN: 2.3 g/dL — AB (ref 3.5–5.2)
ALK PHOS: 101 U/L (ref 39–117)
ALT: 28 U/L (ref 0–53)
AST: 20 U/L (ref 0–37)
Anion gap: 12 (ref 5–15)
BILIRUBIN TOTAL: 1.1 mg/dL (ref 0.3–1.2)
BUN: 35 mg/dL — AB (ref 6–23)
CHLORIDE: 104 meq/L (ref 96–112)
CO2: 21 mEq/L (ref 19–32)
Calcium: 8.2 mg/dL — ABNORMAL LOW (ref 8.4–10.5)
Creatinine, Ser: 2.05 mg/dL — ABNORMAL HIGH (ref 0.50–1.35)
GFR calc Af Amer: 35 mL/min — ABNORMAL LOW (ref >90)
GFR calc non Af Amer: 30 mL/min — ABNORMAL LOW (ref >90)
Glucose, Bld: 121 mg/dL — ABNORMAL HIGH (ref 70–99)
Potassium: 4.4 mEq/L (ref 3.7–5.3)
Sodium: 137 mEq/L (ref 137–147)
Total Protein: 5.8 g/dL — ABNORMAL LOW (ref 6.0–8.3)

## 2014-02-28 LAB — HEPARIN LEVEL (UNFRACTIONATED): Heparin Unfractionated: 0.49 IU/mL (ref 0.30–0.70)

## 2014-02-28 LAB — CBC
HCT: 36.8 % — ABNORMAL LOW (ref 39.0–52.0)
Hemoglobin: 12 g/dL — ABNORMAL LOW (ref 13.0–17.0)
MCH: 28.4 pg (ref 26.0–34.0)
MCHC: 32.6 g/dL (ref 30.0–36.0)
MCV: 87 fL (ref 78.0–100.0)
PLATELETS: 243 10*3/uL (ref 150–400)
RBC: 4.23 MIL/uL (ref 4.22–5.81)
RDW: 14.2 % (ref 11.5–15.5)
WBC: 18.2 10*3/uL — ABNORMAL HIGH (ref 4.0–10.5)

## 2014-02-28 LAB — CREATININE, URINE, RANDOM: Creatinine, Urine: 314.88 mg/dL

## 2014-02-28 LAB — SODIUM, URINE, RANDOM: Sodium, Ur: <20 mEq/L

## 2014-02-28 LAB — LACTIC ACID, PLASMA: LACTIC ACID, VENOUS: 1 mmol/L (ref 0.5–2.2)

## 2014-02-28 MED ORDER — SODIUM CHLORIDE 0.9 % IV BOLUS (SEPSIS)
500.0000 mL | Freq: Once | INTRAVENOUS | Status: AC
  Administered 2014-02-28: 500 mL via INTRAVENOUS

## 2014-02-28 MED ORDER — OXYCODONE-ACETAMINOPHEN 5-325 MG PO TABS
2.0000 | ORAL_TABLET | ORAL | Status: DC | PRN
  Administered 2014-02-28 – 2014-03-02 (×10): 2 via ORAL
  Filled 2014-02-28 (×11): qty 2

## 2014-02-28 MED ORDER — DOCUSATE SODIUM 50 MG/5ML PO LIQD
100.0000 mg | Freq: Two times a day (BID) | ORAL | Status: DC
  Filled 2014-02-28 (×2): qty 10

## 2014-02-28 MED ORDER — DOCUSATE SODIUM 100 MG PO CAPS
100.0000 mg | ORAL_CAPSULE | Freq: Two times a day (BID) | ORAL | Status: DC
  Administered 2014-02-28 – 2014-03-06 (×12): 100 mg via ORAL
  Filled 2014-02-28 (×14): qty 1

## 2014-02-28 MED ORDER — BISACODYL 5 MG PO TBEC
5.0000 mg | DELAYED_RELEASE_TABLET | Freq: Every day | ORAL | Status: DC | PRN
  Administered 2014-03-01: 5 mg via ORAL
  Filled 2014-02-28: qty 1

## 2014-02-28 NOTE — Progress Notes (Addendum)
Received a call from central monitoring stating that patient has converted from 1st degree block to A.fib.  Md Merrie Roof has been paged. Patient is asymptomatic upon assessment. Vital signs stable.

## 2014-02-28 NOTE — Progress Notes (Signed)
Spoke to [place] from [facility] regarding marginal urine output of 61ml/hr. SBP remains adequate. Patient is lethargic but awakens and responds appropriately. No orders at this time. Will continue NS IVF @ 120ml/hr.

## 2014-02-28 NOTE — Progress Notes (Signed)
Patient felt like he could eat supper. Paged Md Titus Mould and got orders to give patient a heart healthy diet. After taking a few bites of food, his abdomen started to hurt. Pain medication given.

## 2014-02-28 NOTE — Progress Notes (Signed)
eLink Physician-Brief Progress Note Patient Name: Dustin Ashley DOB: Mar 28, 1941 MRN: [id number]   Date of Service  02/28/2014  HPI/Events of Note  Low UO  eICU Interventions  Fluid challenge 500 Chk FENa     Intervention Category Intermediate Interventions: Oliguria - evaluation and management  ALVA,RAKESH V. 02/28/2014, 12:54 AM

## 2014-02-28 NOTE — Progress Notes (Signed)
Spoke with MD Merrie Roof about heart rhythm and he stated that as long as the Heart rate stays controlled, and is not sustaining at 120 and above patient is fine.If heart rate starts to sustain above the 120's page him back. He continues on his IV heparin as ordered.

## 2014-02-28 NOTE — Consult Note (Signed)
PULMONARY / CRITICAL CARE MEDICINE   Name: Dustin Ashley MRN: [id number] DOB: 01/23/1941    ADMISSION DATE:  02/26/2014 CONSULTATION DATE:  02/27/14  REFERRING MD :  TRH   CHIEF COMPLAINT:  Consulted for PE and possible sepsis w/ abd source   INITIAL PRESENTATION: 73 yo male admitted 8/15 with severe abd pain w/ recent cholecystectomy on 8/3 found to have PE on CT angio , CT abd pelvis showed small collection of fluid at gallbladder fossa w/ no gas. CCS consulted.  Pt was moved to ICU with concern for sepsis with peritonitis and PCCM was consulted on 8/16.   STUDIES:  8/15 CT angio >>Small pulmonary emboli within a subsegmental left lower lobe  pulmonary arteries. Overall clot burden is minimal. Bibasilar atx  At left lung base  8/15 CT abd/pelvis >>Small postoperative fluid collection at gallbladder fossa 3.2 x 2.1 cm,   Small amount of nonspecific free pelvic fluid. No other acute findings.   SIGNIFICANT EVENTS: 8/16 Transfer to ICU for concern of sepsis  8/17- reduced urine output, bolus  SUBJECTIVE: abd pain  VITAL SIGNS: Temp:  [97.6 F (36.4 C)-99 F (37.2 C)] 98.7 F (37.1 C) (08/17 0758) Pulse Rate:  [65-76] 72 (08/17 0700) Resp:  [7-25] 12 (08/17 0700) BP: (91-133)/(38-66) 119/44 mmHg (08/17 0700) SpO2:  [90 %-97 %] 97 % (08/17 0700) Weight:  [89.1 kg (196 lb 6.9 oz)] 89.1 kg (196 lb 6.9 oz) (08/16 0920) HEMODYNAMICS:   VENTILATOR SETTINGS:   INTAKE / OUTPUT:  Intake/Output Summary (Last 24 hours) at 02/28/14 0836 Last data filed at 02/28/14 0600  Gross per 24 hour  Intake 3710.25 ml  Output    500 ml  Net 3210.25 ml    General: no distress Neuro: lethargic, awakens, should er and abdo pain reported, nonfocal HEENT: jvd low PULM: reduced CV: s1 s2rrr no r Abd0: tender distended, nor/g, BS hypo Extremities: edema 1    LABS:  CBC  Recent Labs Lab 02/27/14 0130 02/27/14 1033 02/28/14 0232  WBC 25.4* 24.5* 18.2*  HGB 14.7 14.1 12.0*  HCT 43.6  42.9 36.8*  PLT 317 285 243   Coag's No results found for this basename: APTT, INR,  in the last 168 hours BMET  Recent Labs Lab 02/26/14 1510 02/27/14 0130 02/28/14 0232  NA 139 137 137  K 4.1 4.3 4.4  CL 103 100 104  CO2 22 22 21   BUN 19 23 35*  CREATININE 1.16 1.42* 2.05*  GLUCOSE 129* 173* 121*   Electrolytes  Recent Labs Lab 02/26/14 1510 02/27/14 0130 02/28/14 0232  CALCIUM 9.0 8.9 8.2*   Sepsis Markers  Recent Labs Lab 02/26/14 1844 02/27/14 1033 02/27/14 1105 02/28/14 0232  LATICACIDVEN 1.74 1.3  --  1.0  PROCALCITON  --   --  1.87  --    ABG No results found for this basename: PHART, PCO2ART, PO2ART,  in the last 168 hours Liver Enzymes  Recent Labs Lab 02/26/14 1510 02/28/14 0232  AST 26 20  ALT 31 28  ALKPHOS 119* 101  BILITOT 0.7 1.1  ALBUMIN 3.1* 2.3*   Cardiac Enzymes  Recent Labs Lab 02/27/14 1105  PROBNP 2847.0*   Glucose  Recent Labs Lab 02/27/14 0812 02/27/14 1141  GLUCAP 134* 122*    Imaging No results found.   ASSESSMENT / PLAN:  PULMONARY  A:Acute PE >8/15 CTA w/ small emboli in LLL, min clot burden  Appears to be provoked w/ recent surgery  Bibasilar atx vs HCAP LLL -  fever and elevated wbc on admit in absence of cough/congestion   P:   Continue Heparin drip  Titrate O2 to keep sats >90% Continue IV abx for now  Repeat cXR in am  IS, upright  CARDIOVASCULAR A: HTN >b/p and HR are stable without signs of shock and no signs of right sided failure , neg troponin  Small PE P:  Doppler legs Heparin drip Cont IVF  Map goals 60  RENAL A: Acute on Chronic Renal Failure, hypovolemia, ATN (sepsis) Renal Artery stenosis   P:   Repeat bmet in am  May need cvp Urine na less 20, maintain pos balance, bolus if needed further, has picked up output  GASTROINTESTINAL A:  Severe Abd pain s/p cholecystectomy 8/3 ? Etiology  8/15 CT abd /pelvis w/ small collection of fluid in GB fossa w/ no gas noted, no  other acute issues , LFT nml ,lipase nml , UA ok   P:   CCS consult , sign off Pain rx As needed   Remains clears lft in am   HEMATOLOGIC A: Leukocytosis improved P:  Monitor h/h closely as on Hep  Coumadin likely in am, ensure no changes in abdo prior to starting Hep drip  INFECTIOUS A:  Fever and elevated WBC ? SIRS process  ?HCAP  ? Abd source   P:   8/15 BCx2 >> Abx: Zosyn , start date 8/15>>> Will likely narrow in am  Vanc 8/15 , culture neg, dc vanc  Check LA and PCT   ENDOCRINE A:  Hyperglycemia  P:   Add SSI if BS >200   NEUROLOGIC A:  Alert  Pain   P:   Pain meds as needed, try to avoid oversedation Consider dc di lauded, add percocet  TODAY'S SUMMARY:urine output improved, continued pos balance, dc dilauded, heparin, to sdu  I have personally obtained a history, examined the patient, evaluated laboratory and imaging results, formulated the assessment and plan and placed orders. CRITICAL CARE: The patient is critically ill with multiple organ systems failure and requires high complexity decision making for assessment and support, frequent evaluation and titration of therapies, application of advanced monitoring technologies and extensive interpretation of multiple databases. Critical Care Time devoted to patient care services described in this note is  minutes.   Lavon Paganini. Titus Mould, MD, [place] Pgr: [facility]

## 2014-02-28 NOTE — Progress Notes (Signed)
ANTICOAGULATION CONSULT NOTE-F/u  Pharmacy Consult for Heparin Indication: pulmonary embolus  Allergies  Allergen Reactions  . Hydrocodone-Acetaminophen Other (See Comments)    REACTION: bladder obstruction  . Tramadol Hcl Other (See Comments)    REACTION: bladder obstruction    Patient Measurements: Height: 5\' 10"  (177.8 cm) Weight: 196 lb 6.9 oz (89.1 kg) IBW/kg (Calculated) : 73 Height: 70 inches Weight: 92.2 kg IBW: 73 kg Heparin Dosing Weight: 92 kg  Vital Signs: Temp: 98.7 F (37.1 C) (08/17 0758) Temp src: Oral (08/17 0758) BP: 125/50 mmHg (08/17 1100) Pulse Rate: 72 (08/17 1100)  Labs:  Recent Labs  02/26/14 1510 02/27/14 0130 02/27/14 1033 02/28/14 0232  HGB 14.0 14.7 14.1 12.0*  HCT 41.8 43.6 42.9 36.8*  PLT 272 317 285 243  HEPARINUNFRC  --  0.30 0.41 0.49  CREATININE 1.16 1.42*  --  2.05*   CrCl ~ 50ml/min  Estimated Creatinine Clearance: 36 ml/min (by C-G formula based on Cr of 2.05).   Medical History: Past Medical History  Diagnosis Date  . HOH (hard of hearing)     has a hearing aid L, sees audiology rountinely  . Allergic rhinitis   . CAD (coronary artery disease)   . GERD (gastroesophageal reflux disease)   . Hyperlipemia   . Hypertension   . Hyperglycemia     A1C 5.8 04-2010  . Pain, joint, multiple sites     uses valium, occ uses for insomnia. uses for shoulder  pain  . Chronic renal insufficiency, stage II (mild)     Cr ~ 1.4, u/s 4-12 normal kidneys  . 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 Personal history of colonic polyps     Medications:  ASA, Amlodipine, Atenolol, Lipitor, Benazepril, Allegra, Niacin, Ketorolac PRN  Assessment: 73 yo M continues on heparin for small PE. HL remains therapeutic 0.49 @1550  units/hr. Hg decreased to 12, plt wnl. No bleeding documented.   Goal of Therapy:  Heparin level 0.3-0.7 units/ml Monitor platelets by anticoagulation protocol: Yes   Plan:   Heparin 1550 units/hr. Heparin  level and CBC daily while on heparin F/u oral anticoagulant (likely 8/18 AM)

## 2014-03-01 ENCOUNTER — Inpatient Hospital Stay (HOSPITAL_COMMUNITY): Payer: Medicare Other

## 2014-03-01 DIAGNOSIS — K859 Acute pancreatitis without necrosis or infection, unspecified: Secondary | ICD-10-CM

## 2014-03-01 DIAGNOSIS — R933 Abnormal findings on diagnostic imaging of other parts of digestive tract: Secondary | ICD-10-CM

## 2014-03-01 DIAGNOSIS — R609 Edema, unspecified: Secondary | ICD-10-CM

## 2014-03-01 DIAGNOSIS — R109 Unspecified abdominal pain: Secondary | ICD-10-CM

## 2014-03-01 LAB — CBC WITH DIFFERENTIAL/PLATELET
BASOS PCT: 0 % (ref 0–1)
Basophils Absolute: 0 10*3/uL (ref 0.0–0.1)
EOS PCT: 0 % (ref 0–5)
Eosinophils Absolute: 0.1 10*3/uL (ref 0.0–0.7)
HCT: 36.7 % — ABNORMAL LOW (ref 39.0–52.0)
Hemoglobin: 12.3 g/dL — ABNORMAL LOW (ref 13.0–17.0)
Lymphocytes Relative: 6 % — ABNORMAL LOW (ref 12–46)
Lymphs Abs: 0.9 10*3/uL (ref 0.7–4.0)
MCH: 28.9 pg (ref 26.0–34.0)
MCHC: 33.5 g/dL (ref 30.0–36.0)
MCV: 86.4 fL (ref 78.0–100.0)
Monocytes Absolute: 0.9 10*3/uL (ref 0.1–1.0)
Monocytes Relative: 6 % (ref 3–12)
NEUTROS PCT: 88 % — AB (ref 43–77)
Neutro Abs: 12.8 10*3/uL — ABNORMAL HIGH (ref 1.7–7.7)
PLATELETS: 253 10*3/uL (ref 150–400)
RBC: 4.25 MIL/uL (ref 4.22–5.81)
RDW: 14.3 % (ref 11.5–15.5)
WBC: 14.6 10*3/uL — ABNORMAL HIGH (ref 4.0–10.5)

## 2014-03-01 LAB — HEPARIN LEVEL (UNFRACTIONATED)
HEPARIN UNFRACTIONATED: 0.16 [IU]/mL — AB (ref 0.30–0.70)
Heparin Unfractionated: <0.1 IU/mL — ABNORMAL LOW (ref 0.30–0.70)
Heparin Unfractionated: <0.1 IU/mL — ABNORMAL LOW (ref 0.30–0.70)

## 2014-03-01 LAB — COMPREHENSIVE METABOLIC PANEL
ALK PHOS: 128 U/L — AB (ref 39–117)
ALT: 31 U/L (ref 0–53)
AST: 29 U/L (ref 0–37)
Albumin: 2.1 g/dL — ABNORMAL LOW (ref 3.5–5.2)
Anion gap: 13 (ref 5–15)
BUN: 22 mg/dL (ref 6–23)
CO2: 19 meq/L (ref 19–32)
Calcium: 8.3 mg/dL — ABNORMAL LOW (ref 8.4–10.5)
Chloride: 108 mEq/L (ref 96–112)
Creatinine, Ser: 1.12 mg/dL (ref 0.50–1.35)
GFR, EST AFRICAN AMERICAN: 73 mL/min — AB (ref >90)
GFR, EST NON AFRICAN AMERICAN: 63 mL/min — AB (ref >90)
GLUCOSE: 113 mg/dL — AB (ref 70–99)
POTASSIUM: 4.2 meq/L (ref 3.7–5.3)
SODIUM: 140 meq/L (ref 137–147)
Total Bilirubin: 1.1 mg/dL (ref 0.3–1.2)
Total Protein: 5.8 g/dL — ABNORMAL LOW (ref 6.0–8.3)

## 2014-03-01 LAB — AMYLASE: Amylase: 16 U/L (ref 0–105)

## 2014-03-01 LAB — LIPASE, BLOOD: Lipase: 14 U/L (ref 11–59)

## 2014-03-01 MED ORDER — HEPARIN BOLUS VIA INFUSION
3000.0000 [IU] | Freq: Once | INTRAVENOUS | Status: AC
  Administered 2014-03-01: 3000 [IU] via INTRAVENOUS
  Filled 2014-03-01: qty 3000

## 2014-03-01 MED ORDER — HYDROMORPHONE HCL PF 1 MG/ML IJ SOLN
INTRAMUSCULAR | Status: AC
  Administered 2014-03-01: 19:00:00
  Filled 2014-03-01: qty 1

## 2014-03-01 MED ORDER — HEPARIN BOLUS VIA INFUSION
3000.0000 [IU] | Freq: Once | INTRAVENOUS | Status: AC
Start: 2014-03-01 — End: 2014-03-01
  Administered 2014-03-01: 3000 [IU] via INTRAVENOUS
  Filled 2014-03-01: qty 3000

## 2014-03-01 MED ORDER — HYDROMORPHONE HCL PF 1 MG/ML IJ SOLN
0.5000 mg | Freq: Once | INTRAMUSCULAR | Status: AC
  Administered 2014-03-01: 0.5 mg via INTRAVENOUS
  Filled 2014-03-01: qty 1

## 2014-03-01 MED ORDER — SODIUM CHLORIDE 0.9 % IV SOLN
INTRAVENOUS | Status: DC
  Administered 2014-03-01 – 2014-03-03 (×2): via INTRAVENOUS

## 2014-03-01 MED ORDER — POLYETHYLENE GLYCOL 3350 17 G PO PACK
17.0000 g | PACK | Freq: Two times a day (BID) | ORAL | Status: DC
  Administered 2014-03-01 – 2014-03-06 (×11): 17 g via ORAL
  Filled 2014-03-01 (×13): qty 1

## 2014-03-01 MED ORDER — HEPARIN BOLUS VIA INFUSION
2000.0000 [IU] | Freq: Once | INTRAVENOUS | Status: AC
  Administered 2014-03-01: 2000 [IU] via INTRAVENOUS
  Filled 2014-03-01: qty 2000

## 2014-03-01 MED ORDER — SODIUM CHLORIDE 0.9 % IV SOLN
INTRAVENOUS | Status: DC
  Administered 2014-03-01: 23:00:00 via INTRAVENOUS

## 2014-03-01 MED ORDER — HYDROMORPHONE HCL PF 1 MG/ML IJ SOLN
0.5000 mg | Freq: Once | INTRAMUSCULAR | Status: AC
  Administered 2014-03-01: 0.5 mg via INTRAVENOUS

## 2014-03-01 NOTE — Progress Notes (Signed)
md Vineet returned call. New order to give dilauded Iv as a one time order. Does not want to order IV pain meds prn.

## 2014-03-01 NOTE — Progress Notes (Signed)
ANTICOAGULATION CONSULT NOTE - Follow Up Consult  Pharmacy Consult for Heparin  Indication: pulmonary embolus and DVT  Allergies  Allergen Reactions  . Hydrocodone-Acetaminophen Other (See Comments)    REACTION: bladder obstruction  . Tramadol Hcl Other (See Comments)    REACTION: bladder obstruction    Patient Measurements: Height: 5\' 10"  (177.8 cm) Weight: 199 lb 12.8 oz (90.629 kg) (bed scale) IBW/kg (Calculated) : 73  Vital Signs: Temp: 98.4 F (36.9 C) (08/18 2056) Temp src: Oral (08/18 2056) BP: 149/86 mmHg (08/18 2056) Pulse Rate: 110 (08/18 2056)  Labs:  Recent Labs  02/27/14 0130 02/27/14 1033 02/28/14 0232 03/01/14 0450 03/01/14 1513 03/01/14 2213  HGB 14.7 14.1 12.0* 12.3*  --   --   HCT 43.6 42.9 36.8* 36.7*  --   --   PLT 317 285 243 253  --   --   HEPARINUNFRC 0.30 0.41 0.49 <0.10* 0.16* <0.10*  CREATININE 1.42*  --  2.05* 1.12  --   --     Estimated Creatinine Clearance: 66.5 ml/min (by C-G formula based on Cr of 1.12).   Medications:  Heparin 1950 units/hr  Assessment: 73 y/o M on heparin for PE/DVT. HL is sub-therapeutic despite multiple rate increases, other labs as above.   Goal of Therapy:  Heparin level 0.3-0.7 units/ml Monitor platelets by anticoagulation protocol: Yes   Plan:  -Heparin 2000 units BOLUS -Increase heparin drip to 2150 units/hr -0730 HL -Daily CBC/HL -Monitor for bleeding  Narda Bonds 03/01/2014,11:17 PM

## 2014-03-01 NOTE — Progress Notes (Signed)
*  PRELIMINARY RESULTS* Vascular Ultrasound Lower extremity venous duplex has been completed.  Preliminary findings: Right = no evidence of DVT. Left = evidence of DVT involving a single left peroneal vein in the mid calf.  Landry Mellow, RDMS, RVT  03/01/2014, 11:00 AM

## 2014-03-01 NOTE — Consult Note (Signed)
PULMONARY / CRITICAL CARE MEDICINE   Name: Dustin Ashley MRN: [id number] DOB: 1941/04/07    ADMISSION DATE:  02/26/2014 CONSULTATION DATE:  02/27/14  REFERRING MD :  TRH   CHIEF COMPLAINT:  Consulted for PE and possible sepsis w/ abd source   INITIAL PRESENTATION:  73 yo male presented with abdominal pain after cholecystectomy on 8/03.  CT chest showed small PE, and CT abd/pelvis showed small fluid collection in gallbladder fossa.  PCCM asked to assume care with concerns for sepsis.  STUDIES:  8/15 CT angio >>Small pulmonary emboli within a subsegmental left lower lobe pulmonary arteries. Overall clot burden is minimal. Bibasilar atx  At left lung base  8/15 CT abd/pelvis >>Small postoperative fluid collection at gallbladder fossa 3.2 x 2.1 cm, small amount of nonspecific free pelvic fluid. No other acute findings.   SIGNIFICANT EVENTS: 8/15 Admit 8/16 transfer to ICU, surgery consulted >> no surgical needs/signed off 8/17 Transfer to floor   SUBJECTIVE:  abd pain ,decreased bs  VITAL SIGNS: Temp:  [97.7 F (36.5 C)-98.7 F (37.1 C)] 97.7 F (36.5 C) (08/18 0556) Pulse Rate:  [70-113] 113 (08/18 0556) Resp:  [12-18] 18 (08/18 0556) BP: (113-141)/(44-74) 141/72 mmHg (08/18 0556) SpO2:  [91 %-97 %] 91 % (08/18 0556) Weight:  [199 lb 12.8 oz (90.629 kg)-201 lb 4.5 oz (91.3 kg)] 199 lb 12.8 oz (90.629 kg) (08/18 0556) INTAKE / OUTPUT:  Intake/Output Summary (Last 24 hours) at 03/01/14 0849 Last data filed at 03/01/14 0816  Gross per 24 hour  Intake 3724.5 ml  Output   1610 ml  Net 2114.5 ml    General: Co abd pain, NAD Neuro: Intact , interactive HEENT:No LVD/LAN PULM: reduced CV: s1 s2rrr no r Abd0: tender distended, nor/g, BS hypo, ll abd pain and tender to palpation Extremities: edema 1    LABS:  CBC  Recent Labs Lab 02/27/14 1033 02/28/14 0232 03/01/14 0450  WBC 24.5* 18.2* 14.6*  HGB 14.1 12.0* 12.3*  HCT 42.9 36.8* 36.7*  PLT 285 243 253    BMET  Recent Labs Lab 02/27/14 0130 02/28/14 0232 03/01/14 0450  NA 137 137 140  K 4.3 4.4 4.2  CL 100 104 108  CO2 22 21 19   BUN 23 35* 22  CREATININE 1.42* 2.05* 1.12  GLUCOSE 173* 121* 113*   Electrolytes  Recent Labs Lab 02/27/14 0130 02/28/14 0232 03/01/14 0450  CALCIUM 8.9 8.2* 8.3*   Sepsis Markers  Recent Labs Lab 02/26/14 1844 02/27/14 1033 02/27/14 1105 02/28/14 0232  LATICACIDVEN 1.74 1.3  --  1.0  PROCALCITON  --   --  1.87  --    Liver Enzymes  Recent Labs Lab 02/26/14 1510 02/28/14 0232 03/01/14 0450  AST 26 20 29   ALT 31 28 31   ALKPHOS 119* 101 128*  BILITOT 0.7 1.1 1.1  ALBUMIN 3.1* 2.3* 2.1*   Cardiac Enzymes  Recent Labs Lab 02/27/14 1105  PROBNP 2847.0*   Glucose  Recent Labs Lab 02/27/14 0812 02/27/14 1141  GLUCAP 134* 122*    Imaging Dg Chest Port 1 View  02/28/2014   CLINICAL DATA:  Pneumonia.  EXAM: PORTABLE CHEST - 1 VIEW  COMPARISON:  CT chest and chest radiograph 02/26/2014.  FINDINGS: Trachea is midline. Heart is at the upper limits of normal in size. Lungs are low in volume with bibasilar atelectasis and probable small left pleural effusion.  IMPRESSION: Low lung volumes with bibasilar atelectasis and probable small left pleural effusion.   Electronically Signed  By: Lorin Picket M.D.   On: 02/28/2014 07:07    Impression:  73 yo male present with abdominal pain after recent cholecystectomy.  Found to have small PE and ?of HCAP vs ATX.  Acute PE >> likely provoked in setting of recent abdominal surgery. Plan: Will need to transition to oral anticoagulant once able to take oral meds Continue heparin gtt for now  ?HCAP. Plan: Day 4 of Abx, current on Zosyn  Blood Cx 8/15 >>  Abdominal pain >> persists. Plan: Monitor clinically F/u Abd xray Repeat amylase/lipase Might need GI evaluation  Hx of HTN. Plan:  AKI 2nd to sepsis/hypovolemia >> improved. Hx of renal artery stenosis. Plan: F/u  renal fx, urine outpt   TODAY'S SUMMARY: Complains of abd pain. No bs. Make NPO. Check abd film. May need GI consult.  Richardson Landry Minor ACNP Maryanna Shape PCCM Pager [phone] till 3 pm If no answer page [phone] 03/01/2014, 9:02 AM  Updated family about plan.  Will ask GI to re-evaluate.  Now that pt is out of ICU will ask [facility] to resume care from 8/19 and PCCM sign off.  Chesley Mires, MD [facility] Pulmonary/Critical Care 03/01/2014, 10:41 AM Pager:  [phone] After 3pm call: [phone]

## 2014-03-01 NOTE — Progress Notes (Signed)
ANTICOAGULATION CONSULT NOTE-F/u  Pharmacy Consult for Heparin Indication: pulmonary embolus  Allergies  Allergen Reactions  . Hydrocodone-Acetaminophen Other (See Comments)    REACTION: bladder obstruction  . Tramadol Hcl Other (See Comments)    REACTION: bladder obstruction    Patient Measurements: Height: 5\' 10"  (177.8 cm) Weight: 199 lb 12.8 oz (90.629 kg) (bed scale) IBW/kg (Calculated) : 73 Height: 70 inches Weight: 92.2 kg IBW: 73 kg Heparin Dosing Weight: 92 kg  Vital Signs: Temp: 98.2 F (36.8 C) (08/18 1405) Temp src: Oral (08/18 1405) BP: 129/51 mmHg (08/18 1405) Pulse Rate: 98 (08/18 1405)  Labs:  Recent Labs  02/27/14 0130 02/27/14 1033 02/28/14 0232 03/01/14 0450 03/01/14 1513  HGB 14.7 14.1 12.0* 12.3*  --   HCT 43.6 42.9 36.8* 36.7*  --   PLT 317 285 243 253  --   HEPARINUNFRC 0.30 0.41 0.49 <0.10* 0.16*  CREATININE 1.42*  --  2.05* 1.12  --    CrCl ~ 33ml/min  Estimated Creatinine Clearance: 66.5 ml/min (by C-G formula based on Cr of 1.12).   Medical History: Past Medical History  Diagnosis Date  . HOH (hard of hearing)     has a hearing aid L, sees audiology rountinely  . Allergic rhinitis   . CAD (coronary artery disease)   . GERD (gastroesophageal reflux disease)   . Hyperlipemia   . Hypertension   . Hyperglycemia     A1C 5.8 04-2010  . Pain, joint, multiple sites     uses valium, occ uses for insomnia. uses for shoulder  pain  . Chronic renal insufficiency, stage II (mild)     Cr ~ 1.4, u/s 4-12 normal kidneys  . 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 Personal history of colonic polyps     Medications:  ASA, Amlodipine, Atenolol, Lipitor, Benazepril, Allegra, Niacin, Ketorolac PRN  Assessment: Heparin level still sub-therapeutic at 0.16  Goal of Therapy:  Heparin level 0.3-0.7 units/ml Monitor platelets by anticoagulation protocol: Yes   Plan:  Give 3000 unit bolus and increase to 1950/hr with 6 hour HL for f/u    Tad Moore

## 2014-03-01 NOTE — Consult Note (Signed)
[name]: 11:23 AM 03/01/2014  LOS: 3 days    Referring Provider: Dr Halford Chessman  Primary Care Physician:  [name], MD Primary Gastroenterologist:  Dr. Carlean Purl     Reason for Consultation:  "Abdominal pain and decreased motility"   HPI: Dustin Ashley is a 73 y.o. male.  S/p CABG 1995, stage 2 CKD, arthritis and MS pain, diminished hearing.  GI hx of large adenomatous colon polyp in 2004.  Surveillance colonoscopy in 2010 with no recurrent polyps.  Due for follow up colonoscopy 06/2014. Never had upper endoscopy.   S/p 02/14/14 lap chole with placement of "snow" due to intraoperative bleeding in GB fossa.  IOC was negative.  Presented with 2 days epigastric pain on 7/31, vomiting after eating chocolate ice cream.  Ultrasound showed 4 mm CBD, benign GB with no stones.  CT scan showed mild infiltration at head of pancreas so ? early pancreatitis, however the lipase was and is consistently normal.  The LFTs were also normal but the alk phos did rise to 116 and the AST to 58 post op day 3.  Preop HIDA 8/1 showed GB EF of only 6% (norm is >41%), pain duplicated with CCK injection. Discharge home on 8/5.  At home used the po Toradol at night and was able to sleep through the night.  He was still having the same epigastric to LUQ pain and pain across upper back.  Pt was not using Nexium, though it is listed as a home med.  Had stopped having GERD several months ago so stopped using this. Not using NSAIDS.  No ETOH.  Admitted 8/15 with severe back pain and upper abdominal pain, fevers to 101.5.  On CTA chest has LLL PNA and small PE.  Started on Heparin drip, Vanco/Zosyn. 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   Pt  describes pain across mid to left upper abdomen, radiating across upper thoracic spine.  Even having pain in area of right trapezius.  Pain worse with movement but the abdominal pain is worse with belching.  Pain not worse with inspiration but pain causes him to take shallow breaths.  Has long hx of back pain and spasms.  He has not had nausea or emesis.  Constipated.   Dr Rosendo Gros, surgeon, says abdominal pain c/w his preop pain. Feels fluid collection is normal post op finding. No surgical intervention needed and has signed off.   LFTs:  Alk phos from 119 to 101 to 128.  AST/ALT and T bil normal.  Xrays: On portable KUB this morning there is slight gastric distention, non-obstructed BGP.    Past Medical History  Diagnosis Date  . HOH (hard of hearing)     has a hearing aid L, sees audiology rountinely  . Allergic rhinitis   . CAD (coronary artery disease)   . GERD (gastroesophageal reflux disease)   . Hyperlipemia   . Hypertension   . Hyperglycemia     A1C 5.8 04-2010  .  Pain, joint, multiple sites     uses valium, occ uses for insomnia. uses for shoulder  pain  . Chronic renal insufficiency, stage II (mild)     Cr ~ 1.4, u/s 4-12 normal kidneys  . 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 Personal history of colonic polyps     Past Surgical History  Procedure Laterality Date  . Coronary artery bypass graft  1995  . Colonoscopy  multiple  . Cholecystectomy N/A 02/15/2014    Procedure: LAPAROSCOPIC CHOLECYSTECTOMY with IOC;  Surgeon: Gayland Curry, MD;  Location: [facility];  Service: General;  Laterality: N/A;    Prior to Admission medications   Medication Sig Start Date End Date Taking? Authorizing Provider  amLODipine (NORVASC) 10 MG tablet Take 10 mg by mouth daily.   Yes Historical Provider, MD  aspirin 81 MG tablet Take 81 mg by mouth daily.     Yes Historical Provider, MD  atenolol (TENORMIN) 50 MG tablet Take 75 mg by mouth daily.   Yes Historical Provider, MD  atorvastatin  (LIPITOR) 20 MG tablet Take 20 mg by mouth daily.    Yes Historical Provider, MD  benazepril (LOTENSIN) 20 MG tablet Take 20 mg by mouth 2 (two) times daily.   Yes Historical Provider, MD  diazepam (VALIUM) 10 MG tablet Take 10 mg by mouth every 12 (twelve) hours as needed for anxiety.   Yes Historical Provider, MD  esomeprazole (NEXIUM) 40 MG capsule Take 1 capsule (40 mg total) by mouth daily as needed. 06/08/13  Yes Colon Branch, MD  fexofenadine (ALLEGRA) 180 MG tablet Take 180 mg by mouth daily.     Yes Historical Provider, MD  ketorolac (TORADOL) 10 MG tablet Take 1 tablet (10 mg total) by mouth every 8 (eight) hours as needed for severe pain. 02/16/14  Yes Hosie Poisson, MD  niacin (NIASPAN) 500 MG CR tablet Take 500 mg by mouth at bedtime.   Yes Historical Provider, MD    Scheduled Meds: . antiseptic oral rinse  7 mL Mouth Rinse q12n4p  . chlorhexidine  15 mL Mouth Rinse BID  . docusate sodium  100 mg Oral BID  . pantoprazole (PROTONIX) IV  40 mg Intravenous Q24H  . piperacillin-tazobactam (ZOSYN)  IV  3.375 g Intravenous Q8H  . sodium chloride  3 mL Intravenous Q12H   Infusions: . sodium chloride 50 mL/hr at 03/01/14 1044  . heparin 1,700 Units/hr (03/01/14 0704)   PRN Meds: acetaminophen, albuterol, alum & mag hydroxide-simeth, bisacodyl, diazepam, ondansetron (ZOFRAN) IV, oxyCODONE-acetaminophen, sodium chloride   Allergies as of 02/26/2014 - Review Complete 02/26/2014  Allergen Reaction Noted  . Hydrocodone-acetaminophen Other (See Comments) 03/03/2007  . Tramadol hcl Other (See Comments) 03/03/2007    Family History  Problem Relation Age of Onset  . Hypertension Father   . Heart attack      2 brothers, CABG  . Cancer Neg Hx     colon, prostate  . Diabetes Neg Hx     History   Social History  . Marital Status: Married    Spouse Name: N/A    Number of Children: 1  . Years of Education: N/A   Occupational History  . retired, delivery truck Geophysicist/field seismologist    Social  History Main Topics  . Smoking status: Former Smoker    Quit date: 03/13/1976  . Smokeless tobacco: Never Used  . Alcohol Use: No  . Drug Use: No  . Sexual Activity: Not on file   Other Topics  Concern  . Not on file   Social History Narrative   Born in [place], has a large extended family, oldest of several brothers-sister         REVIEW OF SYSTEMS: Constitutional: feels a bit weak and fatigued. 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  ENT:  No nose bleeds Pulm:  Per HPI CV:  No palpitations.  + chronic LE edema.  GU:  No hematuria, no frequency, no tea color urine GI:  Per HPI Heme:  No unusual bleeding or bruising.    Transfusions:  none Neuro:  No headaches, no peripheral tingling or numbness MS:  Per HPI Derm:  No itching, no rash or sores.  Endocrine:  No sweats or chills.  No polyuria or dysuria Immunization:  Not queried.  Travel:  None beyond local counties in last few months.    PHYSICAL EXAM: Vital signs in last 24 hours: Filed Vitals:   03/01/14 0556  BP: 141/72  Pulse: 113  Temp: 97.7 F (36.5 C)  Resp: 18   Wt Readings from Last 3 Encounters:  03/01/14 90.629 kg (199 lb 12.8 oz)  02/12/14 92.2 kg (203 lb 4.2 oz)  02/12/14 92.2 kg (203 lb 4.2 oz)    General: pleasant, slightly uncomfortable elderly WM.   Head:  No asymmetry or swelling  Eyes:  No icterus, no pallor Ears:  HOH  Nose:  No congestion or discharge Mouth:  Clear oral mucosa.   Neck:  No mass, no JVD Lungs:  Reduced BS.  No rales or wheezes. Slight dyspnea with speech Heart: RRR.  No MRG Abdomen:  Soft, somewhat tense and protuberant.  BS active.  Minor tenderness in epigastrium to LUQ.  No HSM Rectal: deferred   Musc/Skeltl: no joint erythema, no tenderness in upper trapezius on right Extremities:  2 to 3 + pedal edema.  No clubbing or cyanosis  Neurologic:  No tremor, no limb weakness Skin:  No rash or telangectasia.  No sores Tattoos:   none Nodes:  No cervical adenopathy   Psych:  Pleasant, cooperative, speech a bit slow but accurate.   Intake/Output from previous day: 08/17 0701 - 08/18 0700 In: 3850 [P.O.:440; I.V.:3310; IV Piggyback:100] Out: 1657 [Urine:1657] Intake/Output this shift: Total I/O In: 240 [P.O.:240] Out: -   LAB RESULTS:  Recent Labs  02/27/14 1033 02/28/14 0232 03/01/14 0450  WBC 24.5* 18.2* 14.6*  HGB 14.1 12.0* 12.3*  HCT 42.9 36.8* 36.7*  PLT 285 243 253   BMET Lab Results  Component Value Date   NA 140 03/01/2014   NA 137 02/28/2014   NA 137 02/27/2014   K 4.2 03/01/2014   K 4.4 02/28/2014   K 4.3 02/27/2014   CL 108 03/01/2014   CL 104 02/28/2014   CL 100 02/27/2014   CO2 19 03/01/2014   CO2 21 02/28/2014   CO2 22 02/27/2014   GLUCOSE 113* 03/01/2014   GLUCOSE 121* 02/28/2014   GLUCOSE 173* 02/27/2014   BUN 22 03/01/2014   BUN 35* 02/28/2014   BUN 23 02/27/2014   CREATININE 1.12 03/01/2014   CREATININE 2.05* 02/28/2014   CREATININE 1.42* 02/27/2014   CALCIUM 8.3* 03/01/2014   CALCIUM 8.2* 02/28/2014   CALCIUM 8.9 02/27/2014   LFT  Recent Labs  02/26/14 1510 02/28/14 0232 03/01/14 0450  PROT 6.7 5.8* 5.8*  ALBUMIN 3.1* 2.3* 2.1*  AST _0 ALT _1 ALKPHOS 119* 101 128*  BILITOT 0.7 1.1 1.1  PT/INR Lab Results  Component Value Date   INR 1.08 02/12/2014   Lipase     Component Value Date/Time   LIPASE 27 02/26/2014 1510    RADIOLOGY STUDIES: Dg Chest Port 1 View  03/01/2014   CLINICAL DATA:  Shortness of breath, atelectasis, history coronary artery disease, GERD, hypertension, mild chronic renal insufficiency stage II  EXAM: PORTABLE CHEST - 1 VIEW  COMPARISON:  Portable exam 0526 hr compared to 02/28/2014  FINDINGS: Enlargement of cardiac silhouette post CABG.  Slight pulmonary vascular congestion.  Bibasilar atelectasis and question small LEFT pleural effusion.  Accentuation of perihilar markings versus previous study question minimal edema.  No pneumothorax.   IMPRESSION: Enlargement of cardiac silhouette with vascular congestion post CABG.  Bibasilar atelectasis, small LEFT pleural effusion and question new minimal perihilar edema.   Electronically Signed   By: Mark  Boles M.D.   On: 03/01/2014 08:06   Dg Chest Port 1 View  02/28/2014   CLINICAL DATA:  Pneumonia.  EXAM: PORTABLE CHEST - 1 VIEW  COMPARISON:  CT chest and chest radiograph 02/26/2014.  FINDINGS: Trachea is midline. Heart is at the upper limits of normal in size. Lungs are low in volume with bibasilar atelectasis and probable small left pleural effusion.  IMPRESSION: Low lung volumes with bibasilar atelectasis and probable small left pleural effusion.   Electronically Signed   By: Melinda  Blietz M.D.   On: 02/28/2014 07:07   Dg Abd Portable 1v  03/01/2014   CLINICAL DATA:  Abdominal pain and discomfort, question bowel obstruction, no recent bowel movement, history hypertension, GERD  EXAM: PORTABLE ABDOMEN - 1 VIEW  COMPARISON:  CT abdomen and pelvis 02/26/2014  FINDINGS: Contrast in RIGHT colon.  Gas throughout remainder of colon.  Minimal gas in rectum.  No small bowel dilatation, evidence of bowel wall thickening or bowel obstruction.  Atelectasis versus consolidation LEFT lower lobe with minimal RIGHT basilar atelectasis.  Slight gaseous distention of stomach.  Bones demineralized.  No definite urinary tract calcification.  IMPRESSION: Nonobstructive bowel gas pattern.  Atelectasis versus consolidation LEFT lower lobe.   Electronically Signed   By: Mark  Boles M.D.   On: 03/01/2014 10:15    ENDOSCOPIC STUDIES: 06/2009  Colonoscopy    Dr Gessner INDICATIONS: history of pre-cancerous (adenomatous) colon polyps  large > 2cm villous adenoma and 9 mm villous adenoma removed 2004, ENDOSCOPIC IMPRESSION:  1) Normal colon  2) Internal hemorrhoids in the rectum  3) Excellent prep  4) Prior villous adenomas in 2004, one > 2 cm  REPEAT EXAM: In 5 year(s) for routine colonoscopy for polyp    surveillance.     IMPRESSION:   *  Upper abdomiinal, LUQ pain.  Persist despite lap chole over 2 weeks ago. Some of the description raises concern for MS source (back pain and increased intensity with movement).  No confirmation of pancreatitis on labs or on follow up CT scan.  Rule out PUD.   * Constipation  *  PNA, assume HCAP.  On VANC and Zosyn  *  Small PE: on Heparin drip.      PLAN:     *  EGD set for tomorrow 1030 with MAC sedation.   Ok to eat today.  If EGD unrevealing, we may need to pursue studies to assess for spinal source of sxs.  Will need to determine if Dr Chinedu Agustin ok with EGD while pt on Heparin gtt or if he prefers this to be held preop.    Sarah Gribbin    03/01/2014, 11:23 AM Pager: [phone]       Attending physician's note   I have taken a history, examined the patient and reviewed the chart. I agree with the Advanced Practitioner's note, impression and recommendations. 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 Readmitted with acute LLL PNA and PE on IV antibiotics and IV heparin. Upper abdominal pain, constipation and KUB showing slight gaseous distention of the stomach. Upper abdominal pain could be referred from PNA, PE. Continue PPI daily. Add Miralax bid for constipation. EGD tomorrow at 1030 to R/O ulcer, gastritis, etc. Hold IV heparin for 4 hours prior to EGD.   Ladene Artist, MD [facility]

## 2014-03-01 NOTE — Progress Notes (Signed)
ANTICOAGULATION CONSULT NOTE-F/u  Pharmacy Consult for Heparin Indication: pulmonary embolus  Allergies  Allergen Reactions  . Hydrocodone-Acetaminophen Other (See Comments)    REACTION: bladder obstruction  . Tramadol Hcl Other (See Comments)    REACTION: bladder obstruction    Patient Measurements: Height: 5\' 10"  (177.8 cm) Weight: 199 lb 12.8 oz (90.629 kg) (bed scale) IBW/kg (Calculated) : 73 Height: 70 inches Weight: 92.2 kg IBW: 73 kg Heparin Dosing Weight: 92 kg  Vital Signs: Temp: 97.7 F (36.5 C) (08/18 0556) Temp src: Oral (08/18 0556) BP: 141/72 mmHg (08/18 0556) Pulse Rate: 113 (08/18 0556)  Labs:  Recent Labs  02/26/14 1510  02/27/14 0130 02/27/14 1033 02/28/14 0232 03/01/14 0450  HGB 14.0  --  14.7 14.1 12.0* 12.3*  HCT 41.8  --  43.6 42.9 36.8* 36.7*  PLT 272  --  317 285 243 253  HEPARINUNFRC  --   < > 0.30 0.41 0.49 <0.10*  CREATININE 1.16  --  1.42*  --  2.05*  --   < > = values in this interval not displayed. CrCl ~ 47ml/min  Estimated Creatinine Clearance: 36.3 ml/min (by C-G formula based on Cr of 2.05).   Medical History: Past Medical History  Diagnosis Date  . HOH (hard of hearing)     has a hearing aid L, sees audiology rountinely  . Allergic rhinitis   . CAD (coronary artery disease)   . GERD (gastroesophageal reflux disease)   . Hyperlipemia   . Hypertension   . Hyperglycemia     A1C 5.8 04-2010  . Pain, joint, multiple sites     uses valium, occ uses for insomnia. uses for shoulder  pain  . Chronic renal insufficiency, stage II (mild)     Cr ~ 1.4, u/s 4-12 normal kidneys  . 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 Personal history of colonic polyps     Medications:  ASA, Amlodipine, Atenolol, Lipitor, Benazepril, Allegra, Niacin, Ketorolac PRN  Assessment: Heparin level is undetectable this am after 3 consecutive therapeutic levels at 1550/hr. No infusion related issues per RN. Pump beeped intermittently early in  shift but was resolved.  Goal of Therapy:  Heparin level 0.3-0.7 units/ml Monitor platelets by anticoagulation protocol: Yes   Plan:   Give 3000 unit bolus and increase to 1700/hr with 8 hour HL for f/u   Curlene Dolphin

## 2014-03-01 NOTE — Care Management Note (Addendum)
    Page 1 of 2   03/06/2014     3:20:04 PM CARE MANAGEMENT NOTE 03/06/2014  Patient:  Dustin Ashley, Dustin Ashley   Account Number:  [id number]  Date Initiated:  03/01/2014  Documentation initiated by:  HUTCHINSON,CRYSTAL  Subjective/Objective Assessment:   Abd / back pain post surgical.  PE     Action/Plan:   CM to follow for disposition needs   Anticipated DC Date:  03/04/2014   Anticipated DC Plan:  [place]  CM consult  Medication Assistance      PAC Choice  NA   Choice offered to / List presented to:          [facility] arranged  HH-2 PT      [place].   Status of service:  Completed, signed off Medicare Important Message given?  YES (If response is "NO", the following Medicare IM given date fields will be blank) Date Medicare IM given:  03/03/2014 Medicare IM given by:  HUTCHINSON,CRYSTAL Date Additional Medicare IM given:   Additional Medicare IM given by:    Discharge Disposition:  HOME/SELF CARE  Per UR Regulation:  Reviewed for med. necessity/level of care/duration of stay  If discussed at [place] of Stay Meetings, dates discussed:   03/03/2014    Comments:  03/06/14 CM received call to bring a preactivated 30 day free trial card to room and to set up HHPT.  CM brought card to room and set up HHPT with [facility].  Referral texted to [facility] rep, Winnie.  No other CM needs were communicated. Mariane Masters, BSN, [place].  Crystal Hutchinson RN, BSN, MSHL, CCM  Nurse - Case Manager,  (Unit [phone]  03/04/2014 BENEFITS CHECK: PT WILL REQUIRE A AUTH. $45 [place] [phone] for both Xarelto and Eliquis 5 mg bid Dr. Algis Liming notified. 30 day cards provided to patient and instructed wife on activation process. MD plans to Rx Xarelto. Dispositon Plan:  Home Self care.  Crystal Hutchinson RN, BSN, MSHL, CCM  Nurse - Case Manager, (Unit [phone]  03/03/2014 ---03/03/2014 1034 by Mariann Laster--- BENEFITS CHECK: Xarelto (15 mg, 20 mg, or the [place]) Coverage, copay, deductible, authorizations Per [place] - shows no Medication coverage on current insruance card provided to CM contacted Pharmacy:  [facility] [place]  [phone] who confirms patient has medication coverage through [place].  CM contacted wife via TC to request verification of insruance/medication ID #'s.  Wife can only locate her card.  States will find card and bring to [facility] tomorrow.  CM advised if CM is n/a to leave with Charge nurse.  Advised CM needs copy of info on front and back of card. Disposition Plan:  home / self care. CM will continue to monitor.  Crystal Hutchinson RN, BSN, MSHL, CCM  Nurse - Case Manager,  (Unit [place])  [phone]  03/01/2014 Social:  From home with wife Hep gtt Dispostion Plan:  home / self care.

## 2014-03-01 NOTE — Progress Notes (Signed)
Paged MD Vineet to see if we could get an order for a srtonger pain medication. Oxycodone is not lasting the entire 4 hour for patient, and patient is asking for a srtonger medication for abdominal pain. Awaiting call back.

## 2014-03-01 NOTE — Progress Notes (Signed)
Discontinued foley catheter.

## 2014-03-02 ENCOUNTER — Inpatient Hospital Stay (HOSPITAL_COMMUNITY): Payer: Medicare Other | Admitting: Anesthesiology

## 2014-03-02 ENCOUNTER — Encounter (HOSPITAL_COMMUNITY): Payer: Medicare Other | Admitting: Anesthesiology

## 2014-03-02 ENCOUNTER — Encounter (HOSPITAL_COMMUNITY)
Admission: EM | Disposition: A | Discharge status: Home Health | Payer: Self-pay | Source: Home / Self Care | Attending: Internal Medicine

## 2014-03-02 ENCOUNTER — Inpatient Hospital Stay (HOSPITAL_COMMUNITY): Payer: Medicare Other

## 2014-03-02 ENCOUNTER — Encounter (HOSPITAL_COMMUNITY): Payer: Self-pay | Admitting: Gastroenterology

## 2014-03-02 DIAGNOSIS — K219 Gastro-esophageal reflux disease without esophagitis: Secondary | ICD-10-CM

## 2014-03-02 DIAGNOSIS — R112 Nausea with vomiting, unspecified: Secondary | ICD-10-CM

## 2014-03-02 HISTORY — PX: ESOPHAGOGASTRODUODENOSCOPY: SHX5428

## 2014-03-02 LAB — CBC
HEMATOCRIT: 35.8 % — AB (ref 39.0–52.0)
Hemoglobin: 11.9 g/dL — ABNORMAL LOW (ref 13.0–17.0)
MCH: 28.3 pg (ref 26.0–34.0)
MCHC: 33.2 g/dL (ref 30.0–36.0)
MCV: 85 fL (ref 78.0–100.0)
Platelets: 314 10*3/uL (ref 150–400)
RBC: 4.21 MIL/uL — ABNORMAL LOW (ref 4.22–5.81)
RDW: 14.4 % (ref 11.5–15.5)
WBC: 11 10*3/uL — AB (ref 4.0–10.5)

## 2014-03-02 LAB — BASIC METABOLIC PANEL
Anion gap: 13 (ref 5–15)
BUN: 16 mg/dL (ref 6–23)
CHLORIDE: 104 meq/L (ref 96–112)
CO2: 21 meq/L (ref 19–32)
CREATININE: 1 mg/dL (ref 0.50–1.35)
Calcium: 8.5 mg/dL (ref 8.4–10.5)
GFR calc non Af Amer: 73 mL/min — ABNORMAL LOW (ref >90)
GFR, EST AFRICAN AMERICAN: 84 mL/min — AB (ref >90)
Glucose, Bld: 118 mg/dL — ABNORMAL HIGH (ref 70–99)
Potassium: 4.2 mEq/L (ref 3.7–5.3)
Sodium: 138 mEq/L (ref 137–147)

## 2014-03-02 LAB — HEPARIN LEVEL (UNFRACTIONATED)
Heparin Unfractionated: <0.1 IU/mL — ABNORMAL LOW (ref 0.30–0.70)
Heparin Unfractionated: 0.39 IU/mL (ref 0.30–0.70)

## 2014-03-02 LAB — HEPATIC FUNCTION PANEL
ALT: 27 U/L (ref 0–53)
AST: 23 U/L (ref 0–37)
Albumin: 2 g/dL — ABNORMAL LOW (ref 3.5–5.2)
Alkaline Phosphatase: 145 U/L — ABNORMAL HIGH (ref 39–117)
BILIRUBIN DIRECT: 0.7 mg/dL — AB (ref 0.0–0.3)
BILIRUBIN TOTAL: 1.3 mg/dL — AB (ref 0.3–1.2)
Indirect Bilirubin: 0.6 mg/dL (ref 0.3–0.9)
Total Protein: 5.8 g/dL — ABNORMAL LOW (ref 6.0–8.3)

## 2014-03-02 SURGERY — EGD (ESOPHAGOGASTRODUODENOSCOPY)
Anesthesia: Monitor Anesthesia Care

## 2014-03-02 MED ORDER — BISACODYL 5 MG PO TBEC: 10.0000 mg | DELAYED_RELEASE_TABLET | Freq: Every day | ORAL | Status: DC | PRN

## 2014-03-02 MED ORDER — BISACODYL 5 MG PO TBEC
5.0000 mg | DELAYED_RELEASE_TABLET | Freq: Every day | ORAL | Status: DC | PRN
  Administered 2014-03-02: 5 mg via ORAL
  Filled 2014-03-02: qty 1

## 2014-03-02 MED ORDER — PROPOFOL INFUSION 10 MG/ML OPTIME
INTRAVENOUS | Status: DC | PRN
  Administered 2014-03-02: 75 ug/kg/min via INTRAVENOUS

## 2014-03-02 MED ORDER — HEPARIN BOLUS VIA INFUSION
2000.0000 [IU] | Freq: Once | INTRAVENOUS | Status: DC
  Filled 2014-03-02: qty 2000

## 2014-03-02 MED ORDER — LACTATED RINGERS IV SOLN: INTRAVENOUS | Status: DC

## 2014-03-02 MED ORDER — HEPARIN (PORCINE) IN NACL 100-0.45 UNIT/ML-% IJ SOLN
2700.0000 [IU]/h | INTRAMUSCULAR | Status: DC
  Administered 2014-03-02 (×2): 2450 [IU]/h via INTRAVENOUS
  Administered 2014-03-03 (×2): 2700 [IU]/h via INTRAVENOUS
  Filled 2014-03-02 (×6): qty 250

## 2014-03-02 MED ORDER — BUTAMBEN-TETRACAINE-BENZOCAINE 2-2-14 % EX AERO
INHALATION_SPRAY | CUTANEOUS | Status: DC | PRN
  Administered 2014-03-02: 2 via TOPICAL

## 2014-03-02 MED ORDER — FENTANYL CITRATE 0.05 MG/ML IJ SOLN
INTRAMUSCULAR | Status: DC | PRN
  Administered 2014-03-02: 50 ug via INTRAVENOUS

## 2014-03-02 MED ORDER — PANTOPRAZOLE SODIUM 40 MG PO TBEC
40.0000 mg | DELAYED_RELEASE_TABLET | Freq: Two times a day (BID) | ORAL | Status: DC
  Administered 2014-03-02 – 2014-03-06 (×9): 40 mg via ORAL
  Filled 2014-03-02 (×9): qty 1

## 2014-03-02 MED ORDER — BISACODYL 10 MG RE SUPP
10.0000 mg | Freq: Once | RECTAL | Status: AC
  Administered 2014-03-02: 10 mg via RECTAL
  Filled 2014-03-02: qty 1

## 2014-03-02 MED ORDER — LACTATED RINGERS IV SOLN
INTRAVENOUS | Status: DC | PRN
  Administered 2014-03-02: 10:00:00 via INTRAVENOUS

## 2014-03-02 MED ORDER — HYDROMORPHONE HCL PF 1 MG/ML IJ SOLN: 0.2500 mg | INTRAMUSCULAR | Status: DC | PRN

## 2014-03-02 MED ORDER — PROMETHAZINE HCL 25 MG/ML IJ SOLN: 6.2500 mg | INTRAMUSCULAR | Status: DC | PRN

## 2014-03-02 MED ORDER — HEPARIN BOLUS VIA INFUSION
2000.0000 [IU] | Freq: Once | INTRAVENOUS | Status: AC
  Administered 2014-03-02: 2000 [IU] via INTRAVENOUS
  Filled 2014-03-02: qty 2000

## 2014-03-02 MED ORDER — DEXTROSE IN LACTATED RINGERS 5 % IV SOLN: INTRAVENOUS | Status: DC

## 2014-03-02 NOTE — Anesthesia Preprocedure Evaluation (Addendum)
Anesthesia Evaluation  Patient identified by MRN, date of birth, ID band Patient awake    Reviewed: Allergy & Precautions, H&P , NPO status , Patient's Chart, lab work & pertinent test results  History of Anesthesia Complications Negative for: history of anesthetic complications  Airway Mallampati: II TM Distance: >3 FB Neck ROM: Full    Dental  (+) Partial Lower, Dental Advisory Given   Pulmonary former smoker,    Pulmonary exam normal       Cardiovascular hypertension, Pt. on home beta blockers and Pt. on medications + CAD and + Peripheral Vascular Disease     Neuro/Psych negative neurological ROS  negative psych ROS   GI/Hepatic Neg liver ROS, GERD-  Medicated,  Endo/Other  negative endocrine ROS  Renal/GU Renal InsufficiencyRenal disease     Musculoskeletal   Abdominal   Peds  Hematology negative hematology ROS (+)   Anesthesia Other Findings   Reproductive/Obstetrics                          Anesthesia Physical Anesthesia Plan  ASA: III  Anesthesia Plan: MAC   Post-op Pain Management:    Induction: Intravenous  Airway Management Planned:   Additional Equipment:   Intra-op Plan:   Post-operative Plan:   Informed Consent: I have reviewed the patients History and Physical, chart, labs and discussed the procedure including the risks, benefits and alternatives for the proposed anesthesia with the patient or authorized representative who has indicated his/her understanding and acceptance.   Dental advisory given  Plan Discussed with: Anesthesiologist, Surgeon and CRNA  Anesthesia Plan Comments:        Anesthesia Quick Evaluation

## 2014-03-02 NOTE — Progress Notes (Signed)
Notified attending doctor of patient converting to normals sinus rhythm with first degree heart block and urinary retention issues. Notified oncoming nurse to make [facility] doctors aware when making their rounds and will continue to monitor patient to end of shift.

## 2014-03-02 NOTE — Anesthesia Procedure Notes (Signed)
Procedure Name: MAC Date/Time: 03/02/2014 10:26 AM Performed by: Scheryl Darter Pre-anesthesia Checklist: Patient identified, Emergency Drugs available, Suction available, Timeout performed and Patient being monitored Patient Re-evaluated:Patient Re-evaluated prior to inductionOxygen Delivery Method: Nasal cannula Intubation Type: IV induction Placement Confirmation: positive ETCO2

## 2014-03-02 NOTE — Progress Notes (Signed)
ANTICOAGULATION CONSULT NOTE - Follow Up Consult  Pharmacy Consult for Heparin  Indication: pulmonary embolus and DVT  Allergies  Allergen Reactions  . Hydrocodone-Acetaminophen Other (See Comments)    REACTION: bladder obstruction  . Tramadol Hcl Other (See Comments)    REACTION: bladder obstruction    Patient Measurements: Height: 5\' 10"  (177.8 cm) Weight: 209 lb 11.2 oz (95.119 kg) (bedscale) IBW/kg (Calculated) : 73 Heparin dosing weight: 92 kg  Vital Signs: Temp: 98.2 F (36.8 C) (08/19 0958) Temp src: Oral (08/19 1100) BP: 140/59 mmHg (08/19 1224) Pulse Rate: 79 (08/19 1224)  Labs:  Recent Labs  02/28/14 0232 03/01/14 0450  03/01/14 2213 03/02/14 0335 03/02/14 0716 03/02/14 1958  HGB 12.0* 12.3*  --   --  11.9*  --   --   HCT 36.8* 36.7*  --   --  35.8*  --   --   PLT 243 253  --   --  314  --   --   HEPARINUNFRC 0.49 <0.10*  < > <0.10*  --  <0.10* 0.39  CREATININE 2.05* 1.12  --   --  1.00  --   --   < > = values in this interval not displayed.  Estimated Creatinine Clearance: 76.1 ml/min (by C-G formula based on Cr of 1).  Assessment: The 8 hour heparin level = 0.39 whic is therapeutic on rate of 2450 units/hr IV heparin in this 73 y/o male on heparin for PE/DVT.  S/p  EGD today.  No bleeding reported.  Goal of Therapy:  Heparin level 0.3-0.7 units/ml Monitor platelets by anticoagulation protocol: Yes   Plan:  Continue heparin drip to 2450 units/hr Daily CBC/HL Monitor for bleeding  Nicole Cella, RPh Clinical Pharmacist Pager: [phone] 03/02/2014,9:19 PM

## 2014-03-02 NOTE — Progress Notes (Signed)
PULMONARY / CRITICAL CARE MEDICINE   Name: Dustin Ashley MRN: [id number] DOB: 1941/03/14    ADMISSION DATE:  02/26/2014 CONSULTATION DATE:  02/27/14  REFERRING MD :  TRH   CHIEF COMPLAINT:  Consulted for PE and possible sepsis w/ abd source   INITIAL PRESENTATION:  73 yo male presented with abdominal pain after cholecystectomy on 8/03.  CT chest showed small PE, and CT abd/pelvis showed small fluid collection in gallbladder fossa.  PCCM asked to assume care with concerns for sepsis.  STUDIES:  8/15 CT angio >>Small pulmonary emboli within a subsegmental left lower lobe pulmonary arteries. Overall clot burden is minimal. Bibasilar atx  At left lung base  8/15 CT abd/pelvis >>Small postoperative fluid collection at gallbladder fossa 3.2 x 2.1 cm, small amount of nonspecific free pelvic fluid. No other acute findings.  8/19 EGD >> White exudate distal esophagus, small  SIGNIFICANT EVENTS: 8/15 Admit 8/16 transfer to ICU, surgery consulted >> no surgical needs/signed off 8/17 Transfer to floor  8/19 Ileus on Abd xray   SUBJECTIVE:  Still having difficulty having BM.  VITAL SIGNS: Temp:  [97.9 F (36.6 C)-98.6 F (37 C)] 98.2 F (36.8 C) (08/19 0958) Pulse Rate:  [79-110] 79 (08/19 0935) Resp:  [16-18] 16 (08/19 0958) BP: (121-149)/(50-86) 145/50 mmHg (08/19 0958) SpO2:  [94 %-95 %] 94 % (08/19 0958) Weight:  [209 lb 11.2 oz (95.119 kg)] 209 lb 11.2 oz (95.119 kg) (08/19 0607) INTAKE / OUTPUT:  Intake/Output Summary (Last 24 hours) at 03/02/14 1032 Last data filed at 03/02/14 0950  Gross per 24 hour  Intake    180 ml  Output   1120 ml  Net   -940 ml    General: Co abd pain, NAD, sleepy from pain meds Neuro: Intact , interactive HEENT:No LVD/LAN PULM: reduced CV: s1 s2rrr no r Abd0: tender distended, nor/g, BS hypo, ll abd pain and tender to palpation Extremities: edema 1    LABS:  CBC  Recent Labs Lab 02/28/14 0232 03/01/14 0450 03/02/14 0335  WBC 18.2*  14.6* 11.0*  HGB 12.0* 12.3* 11.9*  HCT 36.8* 36.7* 35.8*  PLT 243 253 314   BMET  Recent Labs Lab 02/28/14 0232 03/01/14 0450 03/02/14 0335  NA 137 140 138  K 4.4 4.2 4.2  CL 104 108 104  CO2 21 19 21   BUN 35* 22 16  CREATININE 2.05* 1.12 1.00  GLUCOSE 121* 113* 118*   Electrolytes  Recent Labs Lab 02/28/14 0232 03/01/14 0450 03/02/14 0335  CALCIUM 8.2* 8.3* 8.5   Sepsis Markers  Recent Labs Lab 02/26/14 1844 02/27/14 1033 02/27/14 1105 02/28/14 0232  LATICACIDVEN 1.74 1.3  --  1.0  PROCALCITON  --   --  1.87  --    Liver Enzymes  Recent Labs Lab 02/28/14 0232 03/01/14 0450 03/02/14 0335  AST 20 29 23   ALT 28 31 27   ALKPHOS 101 128* 145*  BILITOT 1.1 1.1 1.3*  ALBUMIN 2.3* 2.1* 2.0*   Cardiac Enzymes  Recent Labs Lab 02/27/14 1105  PROBNP 2847.0*   Glucose  Recent Labs Lab 02/27/14 0812 02/27/14 1141  GLUCAP 134* 122*    Imaging Dg Chest Port 1 View  03/01/2014   CLINICAL DATA:  Shortness of breath, atelectasis, history coronary artery disease, GERD, hypertension, mild chronic renal insufficiency stage II  EXAM: PORTABLE CHEST - 1 VIEW  COMPARISON:  Portable exam 0526 hr compared to 02/28/2014  FINDINGS: Enlargement of cardiac silhouette post CABG.  Slight pulmonary vascular congestion.  Bibasilar atelectasis and question small LEFT pleural effusion.  Accentuation of perihilar markings versus previous study question minimal edema.  No pneumothorax.  IMPRESSION: Enlargement of cardiac silhouette with vascular congestion post CABG.  Bibasilar atelectasis, small LEFT pleural effusion and question new minimal perihilar edema.   Electronically Signed   By: Lavonia Dana M.D.   On: 03/01/2014 08:06   Dg Abd Portable 1v  03/01/2014   CLINICAL DATA:  Abdominal pain and discomfort, question bowel obstruction, no recent bowel movement, history hypertension, GERD  EXAM: PORTABLE ABDOMEN - 1 VIEW  COMPARISON:  CT abdomen and pelvis 02/26/2014  FINDINGS:  Contrast in RIGHT colon.  Gas throughout remainder of colon.  Minimal gas in rectum.  No small bowel dilatation, evidence of bowel wall thickening or bowel obstruction.  Atelectasis versus consolidation LEFT lower lobe with minimal RIGHT basilar atelectasis.  Slight gaseous distention of stomach.  Bones demineralized.  No definite urinary tract calcification.  IMPRESSION: Nonobstructive bowel gas pattern.  Atelectasis versus consolidation LEFT lower lobe.   Electronically Signed   By: Lavonia Dana M.D.   On: 03/01/2014 10:15    Impression:  73 yo male present with abdominal pain after recent cholecystectomy.  Found to have small PE and ?of HCAP vs ATX.  Acute PE >> likely provoked in setting of recent abdominal surgery. Plan: Will need to transition to oral anticoagulant once able to take oral meds Continue heparin gtt for now  ?HCAP. Plan: Day 5 of Abx, current on Zosyn  Blood Cx 8/15 >>  Abdominal pain >> persists >> appreciate help from GI >> likely epigastric. Ileus. Plan: Advance diet as tolerated Continue protonix BID F/u Bx from distal esophagus 8/19 >>  Bowel regimen >> if no improvement with dulcolax suppository, then try tap water enema  Hx of HTN. Plan: Monitor hemodynamics  AKI 2nd to sepsis/hypovolemia >> resolved. Hx of renal artery stenosis. Plan: F/u renal fx, urine outpt   Richardson Landry Minor ACNP Maryanna Shape PCCM Pager [phone] till 3 pm If no answer page [phone] 03/02/2014, 10:32 AM  Appreciate help from GI.  Updated family about plan.  [facility] to assume care from 8/20 and PCCM sign off.  Chesley Mires, MD [facility] Pulmonary/Critical Care 03/02/2014, 2:45 PM Pager:  [phone] After 3pm call: [phone]

## 2014-03-02 NOTE — Progress Notes (Addendum)
ANTICOAGULATION CONSULT NOTE - Follow Up Consult  Pharmacy Consult for Heparin  Indication: pulmonary embolus and DVT  Allergies  Allergen Reactions  . Hydrocodone-Acetaminophen Other (See Comments)    REACTION: bladder obstruction  . Tramadol Hcl Other (See Comments)    REACTION: bladder obstruction    Patient Measurements: Height: 5\' 10"  (177.8 cm) Weight: 209 lb 11.2 oz (95.119 kg) (bedscale) IBW/kg (Calculated) : 73  Vital Signs: Temp: 97.9 F (36.6 C) (08/19 0607) Temp src: Oral (08/19 0607) BP: 125/80 mmHg (08/19 0607) Pulse Rate: 100 (08/19 0607)  Labs:  Recent Labs  02/28/14 0232 03/01/14 0450 03/01/14 1513 03/01/14 2213 03/02/14 0335 03/02/14 0716  HGB 12.0* 12.3*  --   --  11.9*  --   HCT 36.8* 36.7*  --   --  35.8*  --   PLT 243 253  --   --  314  --   HEPARINUNFRC 0.49 <0.10* 0.16* <0.10*  --  <0.10*  CREATININE 2.05* 1.12  --   --  1.00  --     Estimated Creatinine Clearance: 76.1 ml/min (by C-G formula based on Cr of 1).  Assessment: 73 y/o M on heparin for PE/DVT. HL is sub-therapeutic despite multiple rate increases, other labs as above.   Goal of Therapy:  Heparin level 0.3-0.7 units/ml Monitor platelets by anticoagulation protocol: Yes   Plan:  -Heparin 2000 units BOLUS -Increase heparin drip to 2450 units/hr -1600 HL -Daily CBC/HL -Monitor for bleeding  Tad Moore 03/02/2014,8:49 AM

## 2014-03-02 NOTE — Progress Notes (Signed)
Daily Rounding Note  03/02/2014, 8:34 AM  LOS: 4 days   SUBJECTIVE:       Still with epigastric pain, not as much back pain.  Percocet not helpful, so getting prn doses of dilaudid.  No nausea.  + urinary retention after Foley pulled yesterday.  No BMs for 5 days.   OBJECTIVE:         Vital signs in last 24 hours:    Temp:  [97.9 F (36.6 C)-98.4 F (36.9 C)] 97.9 F (36.6 C) (08/19 0607) Pulse Rate:  [98-110] 100 (08/19 0607) Resp:  [18] 18 (08/19 0607) BP: (125-149)/(51-86) 125/80 mmHg (08/19 0607) SpO2:  [94 %-95 %] 94 % (08/19 0607) Weight:  [95.119 kg (209 lb 11.2 oz)] 95.119 kg (209 lb 11.2 oz) (08/19 0607) Last BM Date: 02/25/14 (miralax and colace given) General: looks ill, somewhat diaphoretic   Heart: RRR Chest: clear bil.  Abdomen:  Tender epigastrium.  Firm.  BS hypoactive.  Extremities: + LE edema Neuro/Psych:  Alert, oriented x 3.  No tremor.   Intake/Output from previous day: 08/18 0701 - 08/19 0700 In: 420 [P.O.:420] Out: 900 [Urine:900]  Intake/Output this shift:    Lab Results:  Recent Labs  02/28/14 0232 03/01/14 0450 03/02/14 0335  WBC 18.2* 14.6* 11.0*  HGB 12.0* 12.3* 11.9*  HCT 36.8* 36.7* 35.8*  PLT 243 253 314   BMET  Recent Labs  02/28/14 0232 03/01/14 0450 03/02/14 0335  NA 137 140 138  K 4.4 4.2 4.2  CL 104 108 104  CO2 21 19 21   GLUCOSE 121* 113* 118*  BUN 35* 22 16  CREATININE 2.05* 1.12 1.00  CALCIUM 8.2* 8.3* 8.5   LFT  Recent Labs  02/28/14 0232 03/01/14 0450 03/02/14 0335  PROT 5.8* 5.8* 5.8*  ALBUMIN 2.3* 2.1* 2.0*  AST 20 29 23   ALT 28 31 27   ALKPHOS 101 128* 145*  BILITOT 1.1 1.1 1.3*  BILIDIR  --   --  0.7*  IBILI  --   --  0.6   PT/INR No results found for this basename: LABPROT, INR,  in the last 72 hours  Studies/Results: Dg Chest Port 1 View  03/01/2014   CLINICAL DATA:  Shortness of breath, atelectasis, history coronary artery  disease, GERD, hypertension, mild chronic renal insufficiency stage II  EXAM: PORTABLE CHEST - 1 VIEW  COMPARISON:  Portable exam 0526 hr compared to 02/28/2014  FINDINGS: Enlargement of cardiac silhouette post CABG.  Slight pulmonary vascular congestion.  Bibasilar atelectasis and question small LEFT pleural effusion.  Accentuation of perihilar markings versus previous study question minimal edema.  No pneumothorax.  IMPRESSION: Enlargement of cardiac silhouette with vascular congestion post CABG.  Bibasilar atelectasis, small LEFT pleural effusion and question new minimal perihilar edema.   Electronically Signed   By: Lavonia Dana M.D.   On: 03/01/2014 08:06   Dg Abd Portable 1v  03/01/2014   CLINICAL DATA:  Abdominal pain and discomfort, question bowel obstruction, no recent bowel movement, history hypertension, GERD  EXAM: PORTABLE ABDOMEN - 1 VIEW  COMPARISON:  CT abdomen and pelvis 02/26/2014  FINDINGS: Contrast in RIGHT colon.  Gas throughout remainder of colon.  Minimal gas in rectum.  No small bowel dilatation, evidence of bowel wall thickening or bowel obstruction.  Atelectasis versus consolidation LEFT lower lobe with minimal RIGHT basilar atelectasis.  Slight gaseous distention of stomach.  Bones demineralized.  No definite urinary tract calcification.  IMPRESSION: Nonobstructive  bowel gas pattern.  Atelectasis versus consolidation LEFT lower lobe.   Electronically Signed   By: Lavonia Dana M.D.   On: 03/01/2014 10:15   Scheduled Meds: . antiseptic oral rinse  7 mL Mouth Rinse q12n4p  . chlorhexidine  15 mL Mouth Rinse BID  . docusate sodium  100 mg Oral BID  . heparin  2,000 Units Intravenous Once  . pantoprazole (PROTONIX) IV  40 mg Intravenous Q24H  . piperacillin-tazobactam (ZOSYN)  IV  3.375 g Intravenous Q8H  . polyethylene glycol  17 g Oral BID  . sodium chloride  3 mL Intravenous Q12H   Continuous Infusions: . sodium chloride 50 mL/hr at 03/01/14 1044  . sodium chloride 20 mL/hr at  03/01/14 2313  . heparin 2,150 Units/hr (03/02/14 0311)   PRN Meds:.acetaminophen, albuterol, alum & mag hydroxide-simeth, bisacodyl, diazepam, ondansetron (ZOFRAN) IV, oxyCODONE-acetaminophen, sodium chloride   ASSESMENT:   * Upper abdomiinal, LUQ pain. Persist despite lap chole over 2 weeks ago. Some of the description raises concern for MS source (back pain and increased intensity with movement). No confirmation of pancreatitis on labs or on follow up CT scan. Rule out PUD.  * Constipation, worse with added narcotics. Persists despite Miralax and colace.  * PNA, assume HCAP. On Zosyn, previously Vanc.  * Small PE: on Heparin drip. Heparin on hold for EGD.  *  ARF: resolved.  *  Urinary retention.  Foley being replaced now.     PLAN   *  EGD off Heparin at 10:30 today.  *  Add dulcolax   Dustin Ashley  03/02/2014, 8:34 AM Pager: [phone]     Attending physician's note   I have taken an interval history, reviewed the chart and examined the patient. I agree with the Advanced Practitioner's note, impression and recommendations. Continue treatment for constipation. Upper abdominal pain persist. For EGD today.  Dustin Ashley. Fuller Plan, MD [facility]

## 2014-03-02 NOTE — Transfer of Care (Signed)
Immediate Anesthesia Transfer of Care Note  Patient: Dustin Ashley  Procedure(s) Performed: Procedure(s) with comments: ESOPHAGOGASTRODUODENOSCOPY (EGD) (N/A) - sarah/leone  Patient Location: PACU and Endoscopy Unit  Anesthesia Type:MAC  Level of Consciousness: awake, oriented and sedated  Airway & Oxygen Therapy: Patient Spontanous Breathing and Patient connected to face mask oxygen  Post-op Assessment: Report given to PACU RN, Post -op Vital signs reviewed and stable and Patient moving all extremities  Post vital signs: Reviewed and stable  Complications: No apparent anesthesia complications

## 2014-03-02 NOTE — Progress Notes (Signed)
At shift change was notified by CMT that patient had a 2.5 pause and converted from atrial flutter/atrial fibrillation to normal sinus rhythm with 1st degree heart block. Patient asymptomatic but still continues to have abdominal pain. EKG is currently being performed by nurse tech. Also in regards to his renal insufficiency, patient voided x1 during the night shift of 250cc.Bladder scan done and was between 150-160.  Paged on-call doctor twice and currently waiting for return page. Reported the above to oncoming nurse and will notify doctor when they return the page. Nurse signing off at this time.

## 2014-03-02 NOTE — Anesthesia Postprocedure Evaluation (Signed)
Anesthesia Post Note  Patient: Dustin Ashley  Procedure(s) Performed: Procedure(s) (LRB): ESOPHAGOGASTRODUODENOSCOPY (EGD) (N/A)  Anesthesia type: MAC  Patient location: PACU  Post pain: Pain level controlled  Post assessment: Patient's Cardiovascular Status Stable  Last Vitals:  Filed Vitals:   03/02/14 1224  BP: 140/59  Pulse: 79  Temp:   Resp: 12    Post vital signs: Reviewed and stable  Level of consciousness: sedated  Complications: No apparent anesthesia complications

## 2014-03-02 NOTE — Interval H&P Note (Signed)
History and Physical Interval Note:  03/02/2014 10:07 AM  Dustin Ashley  has presented today for surgery, with the diagnosis of pain in upper abdomen. gastric distention on xray.  The various methods of treatment have been discussed with the patient and family. After consideration of risks, benefits and other options for treatment, the patient has consented to  Procedure(s) with comments: ESOPHAGOGASTRODUODENOSCOPY (EGD) (N/A) - sarah/leone as a surgical intervention .  The patient's history has been reviewed, patient examined, no change in status, stable for surgery.  I have reviewed the patient's chart and labs.  Questions were answered to the patient's satisfaction.     [name]. Fuller Plan MD

## 2014-03-02 NOTE — Op Note (Signed)
[facility] [place], [address]   ENDOSCOPY PROCEDURE REPORT  PATIENT: Dustin Ashley, Dustin Ashley  MR#: [id number] BIRTHDATE: Oct 28, 1940 , 73  yrs. old GENDER: Male ENDOSCOPIST: Ladene Artist, MD, [facility] REFERRED BY:  [facility] PROCEDURE DATE:  03/02/2014 PROCEDURE:  EGD w/ biopsy ASA CLASS:     Class III INDICATIONS:  abdominal pain in upper left quadrant.   abnormal abdominal x-ray. MEDICATIONS: MAC sedation, administered by CRNA TOPICAL ANESTHETIC: none DESCRIPTION OF PROCEDURE: After the risks benefits and alternatives of the procedure were thoroughly explained, informed consent was obtained.  The Pentax Gastroscope [id number] endoscope was introduced through the mouth and advanced to the second portion of the duodenum. Limited by retained thick fluids and food in the proximal gastric body and fundus which could not be completely suctioned. The instrument was slowly withdrawn as the mucosa was fully examined.  ESOPHAGUS: White exudates in the distal esophagus which did not have a typical appearance for candida.  Biopsies obtained. The esophagus was otherwise normal. STOMACH: The mucosa of the stomach appeared normal. Some retained solids and thick liquids which obscured parts of the proximal body and fundus. DUODENUM: The duodenal mucosa showed no abnormalities in the bulb and second portion of the duodenum.  Retroflexed views revealed a small hiatal hernia.     The scope was then withdrawn from the patient and the procedure completed.  COMPLICATIONS: There were no complications.  ENDOSCOPIC IMPRESSION: 1.   White exudates in the distal esophagus.  Biopsies obtained. 2.   Small hiatal hernia 3.   Some retained gastric solids/thick liquids  RECOMMENDATIONS: 1.  Await pathology results 2.  Anti-reflux regimen and full liquid diet for now 3.  PPI bid 4.  Abd films xray to r/o ileus 5.  OK to resume IV heparin infusion in 2  hours  eSigned:  Ladene Artist, MD, [facility] 03/02/2014 10:58 AM   CC:      Silvano Rusk, MD

## 2014-03-02 NOTE — Progress Notes (Signed)
UR completed Adriane Gabbert K. Adesuwa Osgood, RN, BSN, MSHL, CCM  03/02/2014 11:35 PM

## 2014-03-02 NOTE — H&P (View-Only) (Signed)
[name]: 11:23 AM 03/01/2014  LOS: 3 days    Referring Provider: Dr Halford Chessman  Primary Care Physician:  [name], MD Primary Gastroenterologist:  Dr. Carlean Purl     Reason for Consultation:  "Abdominal pain and decreased motility"   HPI: Dustin Ashley is a 73 y.o. male.  S/p CABG 1995, stage 2 CKD, arthritis and MS pain, diminished hearing.  GI hx of large adenomatous colon polyp in 2004.  Surveillance colonoscopy in 2010 with no recurrent polyps.  Due for follow up colonoscopy 06/2014. Never had upper endoscopy.   S/p 02/14/14 lap chole with placement of "snow" due to intraoperative bleeding in GB fossa.  IOC was negative.  Presented with 2 days epigastric pain on 7/31, vomiting after eating chocolate ice cream.  Ultrasound showed 4 mm CBD, benign GB with no stones.  CT scan showed mild infiltration at head of pancreas so ? early pancreatitis, however the lipase was and is consistently normal.  The LFTs were also normal but the alk phos did rise to 116 and the AST to 58 post op day 3.  Preop HIDA 8/1 showed GB EF of only 6% (norm is >41%), pain duplicated with CCK injection. Discharge home on 8/5.  At home used the po Toradol at night and was able to sleep through the night.  He was still having the same epigastric to LUQ pain and pain across upper back.  Pt was not using Nexium, though it is listed as a home med.  Had stopped having GERD several months ago so stopped using this. Not using NSAIDS.  No ETOH.  Admitted 8/15 with severe back pain and upper abdominal pain, fevers to 101.5.  On CTA chest has LLL PNA and small PE.  Started on Heparin drip, Vanco/Zosyn. 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   Pt  describes pain across mid to left upper abdomen, radiating across upper thoracic spine.  Even having pain in area of right trapezius.  Pain worse with movement but the abdominal pain is worse with belching.  Pain not worse with inspiration but pain causes him to take shallow breaths.  Has long hx of back pain and spasms.  He has not had nausea or emesis.  Constipated.   Dr Rosendo Gros, surgeon, says abdominal pain c/w his preop pain. Feels fluid collection is normal post op finding. No surgical intervention needed and has signed off.   LFTs:  Alk phos from 119 to 101 to 128.  AST/ALT and T bil normal.  Xrays: On portable KUB this morning there is slight gastric distention, non-obstructed BGP.    Past Medical History  Diagnosis Date  . HOH (hard of hearing)     has a hearing aid L, sees audiology rountinely  . Allergic rhinitis   . CAD (coronary artery disease)   . GERD (gastroesophageal reflux disease)   . Hyperlipemia   . Hypertension   . Hyperglycemia     A1C 5.8 04-2010  .  Pain, joint, multiple sites     uses valium, occ uses for insomnia. uses for shoulder  pain  . Chronic renal insufficiency, stage II (mild)     Cr ~ 1.4, u/s 4-12 normal kidneys  . 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 Personal history of colonic polyps     Past Surgical History  Procedure Laterality Date  . Coronary artery bypass graft  1995  . Colonoscopy  multiple  . Cholecystectomy N/A 02/15/2014    Procedure: LAPAROSCOPIC CHOLECYSTECTOMY with IOC;  Surgeon: Gayland Curry, MD;  Location: [facility];  Service: General;  Laterality: N/A;    Prior to Admission medications   Medication Sig Start Date End Date Taking? Authorizing Provider  amLODipine (NORVASC) 10 MG tablet Take 10 mg by mouth daily.   Yes Historical Provider, MD  aspirin 81 MG tablet Take 81 mg by mouth daily.     Yes Historical Provider, MD  atenolol (TENORMIN) 50 MG tablet Take 75 mg by mouth daily.   Yes Historical Provider, MD  atorvastatin  (LIPITOR) 20 MG tablet Take 20 mg by mouth daily.    Yes Historical Provider, MD  benazepril (LOTENSIN) 20 MG tablet Take 20 mg by mouth 2 (two) times daily.   Yes Historical Provider, MD  diazepam (VALIUM) 10 MG tablet Take 10 mg by mouth every 12 (twelve) hours as needed for anxiety.   Yes Historical Provider, MD  esomeprazole (NEXIUM) 40 MG capsule Take 1 capsule (40 mg total) by mouth daily as needed. 06/08/13  Yes Colon Branch, MD  fexofenadine (ALLEGRA) 180 MG tablet Take 180 mg by mouth daily.     Yes Historical Provider, MD  ketorolac (TORADOL) 10 MG tablet Take 1 tablet (10 mg total) by mouth every 8 (eight) hours as needed for severe pain. 02/16/14  Yes Hosie Poisson, MD  niacin (NIASPAN) 500 MG CR tablet Take 500 mg by mouth at bedtime.   Yes Historical Provider, MD    Scheduled Meds: . antiseptic oral rinse  7 mL Mouth Rinse q12n4p  . chlorhexidine  15 mL Mouth Rinse BID  . docusate sodium  100 mg Oral BID  . pantoprazole (PROTONIX) IV  40 mg Intravenous Q24H  . piperacillin-tazobactam (ZOSYN)  IV  3.375 g Intravenous Q8H  . sodium chloride  3 mL Intravenous Q12H   Infusions: . sodium chloride 50 mL/hr at 03/01/14 1044  . heparin 1,700 Units/hr (03/01/14 0704)   PRN Meds: acetaminophen, albuterol, alum & mag hydroxide-simeth, bisacodyl, diazepam, ondansetron (ZOFRAN) IV, oxyCODONE-acetaminophen, sodium chloride   Allergies as of 02/26/2014 - Review Complete 02/26/2014  Allergen Reaction Noted  . Hydrocodone-acetaminophen Other (See Comments) 03/03/2007  . Tramadol hcl Other (See Comments) 03/03/2007    Family History  Problem Relation Age of Onset  . Hypertension Father   . Heart attack      2 brothers, CABG  . Cancer Neg Hx     colon, prostate  . Diabetes Neg Hx     History   Social History  . Marital Status: Married    Spouse Name: N/A    Number of Children: 1  . Years of Education: N/A   Occupational History  . retired, delivery truck Geophysicist/field seismologist    Social  History Main Topics  . Smoking status: Former Smoker    Quit date: 03/13/1976  . Smokeless tobacco: Never Used  . Alcohol Use: No  . Drug Use: No  . Sexual Activity: Not on file   Other Topics  Concern  . Not on file   Social History Narrative   Born in [place], has a large extended family, oldest of several brothers-sister         REVIEW OF SYSTEMS: Constitutional: feels a bit weak and fatigued. 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  ENT:  No nose bleeds Pulm:  Per HPI CV:  No palpitations.  + chronic LE edema.  GU:  No hematuria, no frequency, no tea color urine GI:  Per HPI Heme:  No unusual bleeding or bruising.    Transfusions:  none Neuro:  No headaches, no peripheral tingling or numbness MS:  Per HPI Derm:  No itching, no rash or sores.  Endocrine:  No sweats or chills.  No polyuria or dysuria Immunization:  Not queried.  Travel:  None beyond local counties in last few months.    PHYSICAL EXAM: Vital signs in last 24 hours: Filed Vitals:   03/01/14 0556  BP: 141/72  Pulse: 113  Temp: 97.7 F (36.5 C)  Resp: 18   Wt Readings from Last 3 Encounters:  03/01/14 90.629 kg (199 lb 12.8 oz)  02/12/14 92.2 kg (203 lb 4.2 oz)  02/12/14 92.2 kg (203 lb 4.2 oz)    General: pleasant, slightly uncomfortable elderly WM.   Head:  No asymmetry or swelling  Eyes:  No icterus, no pallor Ears:  HOH  Nose:  No congestion or discharge Mouth:  Clear oral mucosa.   Neck:  No mass, no JVD Lungs:  Reduced BS.  No rales or wheezes. Slight dyspnea with speech Heart: RRR.  No MRG Abdomen:  Soft, somewhat tense and protuberant.  BS active.  Minor tenderness in epigastrium to LUQ.  No HSM Rectal: deferred   Musc/Skeltl: no joint erythema, no tenderness in upper trapezius on right Extremities:  2 to 3 + pedal edema.  No clubbing or cyanosis  Neurologic:  No tremor, no limb weakness Skin:  No rash or telangectasia.  No sores Tattoos:   none Nodes:  No cervical adenopathy   Psych:  Pleasant, cooperative, speech a bit slow but accurate.   Intake/Output from previous day: 08/17 0701 - 08/18 0700 In: 3850 [P.O.:440; I.V.:3310; IV Piggyback:100] Out: 1657 [Urine:1657] Intake/Output this shift: Total I/O In: 240 [P.O.:240] Out: -   LAB RESULTS:  Recent Labs  02/27/14 1033 02/28/14 0232 03/01/14 0450  WBC 24.5* 18.2* 14.6*  HGB 14.1 12.0* 12.3*  HCT 42.9 36.8* 36.7*  PLT 285 243 253   BMET Lab Results  Component Value Date   NA 140 03/01/2014   NA 137 02/28/2014   NA 137 02/27/2014   K 4.2 03/01/2014   K 4.4 02/28/2014   K 4.3 02/27/2014   CL 108 03/01/2014   CL 104 02/28/2014   CL 100 02/27/2014   CO2 19 03/01/2014   CO2 21 02/28/2014   CO2 22 02/27/2014   GLUCOSE 113* 03/01/2014   GLUCOSE 121* 02/28/2014   GLUCOSE 173* 02/27/2014   BUN 22 03/01/2014   BUN 35* 02/28/2014   BUN 23 02/27/2014   CREATININE 1.12 03/01/2014   CREATININE 2.05* 02/28/2014   CREATININE 1.42* 02/27/2014   CALCIUM 8.3* 03/01/2014   CALCIUM 8.2* 02/28/2014   CALCIUM 8.9 02/27/2014   LFT  Recent Labs  02/26/14 1510 02/28/14 0232 03/01/14 0450  PROT 6.7 5.8* 5.8*  ALBUMIN 3.1* 2.3* 2.1*  AST _0 ALT _1 ALKPHOS 119* 101 128*  BILITOT 0.7 1.1 1.1  PT/INR Lab Results  Component Value Date   INR 1.08 02/12/2014   Lipase     Component Value Date/Time   LIPASE 27 02/26/2014 1510    RADIOLOGY STUDIES: Dg Chest Port 1 View  03/01/2014   CLINICAL DATA:  Shortness of breath, atelectasis, history coronary artery disease, GERD, hypertension, mild chronic renal insufficiency stage II  EXAM: PORTABLE CHEST - 1 VIEW  COMPARISON:  Portable exam 0526 hr compared to 02/28/2014  FINDINGS: Enlargement of cardiac silhouette post CABG.  Slight pulmonary vascular congestion.  Bibasilar atelectasis and question small LEFT pleural effusion.  Accentuation of perihilar markings versus previous study question minimal edema.  No pneumothorax.   IMPRESSION: Enlargement of cardiac silhouette with vascular congestion post CABG.  Bibasilar atelectasis, small LEFT pleural effusion and question new minimal perihilar edema.   Electronically Signed   By: Lavonia Dana M.D.   On: 03/01/2014 08:06   Dg Chest Port 1 View  02/28/2014   CLINICAL DATA:  Pneumonia.  EXAM: PORTABLE CHEST - 1 VIEW  COMPARISON:  CT chest and chest radiograph 02/26/2014.  FINDINGS: Trachea is midline. Heart is at the upper limits of normal in size. Lungs are low in volume with bibasilar atelectasis and probable small left pleural effusion.  IMPRESSION: Low lung volumes with bibasilar atelectasis and probable small left pleural effusion.   Electronically Signed   By: Lorin Picket M.D.   On: 02/28/2014 07:07   Dg Abd Portable 1v  03/01/2014   CLINICAL DATA:  Abdominal pain and discomfort, question bowel obstruction, no recent bowel movement, history hypertension, GERD  EXAM: PORTABLE ABDOMEN - 1 VIEW  COMPARISON:  CT abdomen and pelvis 02/26/2014  FINDINGS: Contrast in RIGHT colon.  Gas throughout remainder of colon.  Minimal gas in rectum.  No small bowel dilatation, evidence of bowel wall thickening or bowel obstruction.  Atelectasis versus consolidation LEFT lower lobe with minimal RIGHT basilar atelectasis.  Slight gaseous distention of stomach.  Bones demineralized.  No definite urinary tract calcification.  IMPRESSION: Nonobstructive bowel gas pattern.  Atelectasis versus consolidation LEFT lower lobe.   Electronically Signed   By: Lavonia Dana M.D.   On: 03/01/2014 10:15    ENDOSCOPIC STUDIES: 06/2009  Colonoscopy    Dr Carlean Purl INDICATIONS: history of pre-cancerous (adenomatous) colon polyps  large > 2cm villous adenoma and 9 mm villous adenoma removed 2004, ENDOSCOPIC IMPRESSION:  1) Normal colon  2) Internal hemorrhoids in the rectum  3) Excellent prep  4) Prior villous adenomas in 2004, one > 2 cm  REPEAT EXAM: In 5 year(s) for routine colonoscopy for polyp    surveillance.     IMPRESSION:   *  Upper abdomiinal, LUQ pain.  Persist despite lap chole over 2 weeks ago. Some of the description raises concern for MS source (back pain and increased intensity with movement).  No confirmation of pancreatitis on labs or on follow up CT scan.  Rule out PUD.   * Constipation  *  PNA, assume HCAP.  On VANC and Zosyn  *  Small PE: on Heparin drip.      PLAN:     *  EGD set for tomorrow 1030 with MAC sedation.   Ok to eat today.  If EGD unrevealing, we may need to pursue studies to assess for spinal source of sxs.  Will need to determine if Dr Fuller Plan ok with EGD while pt on Heparin gtt or if he prefers this to be held preop.    Azucena Freed  03/01/2014, 11:23 AM Pager: [phone]       Attending physician's note   I have taken a history, examined the patient and reviewed the chart. I agree with the Advanced Practitioner's note, impression and recommendations. 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 Readmitted with acute LLL PNA and PE on IV antibiotics and IV heparin. Upper abdominal pain, constipation and KUB showing slight gaseous distention of the stomach. Upper abdominal pain could be referred from PNA, PE. Continue PPI daily. Add Miralax bid for constipation. EGD tomorrow at 1030 to R/O ulcer, gastritis, etc. Hold IV heparin for 4 hours prior to EGD.   Ladene Artist, MD [facility]

## 2014-03-03 ENCOUNTER — Telehealth: Payer: Self-pay | Admitting: Internal Medicine

## 2014-03-03 ENCOUNTER — Encounter (HOSPITAL_COMMUNITY): Payer: Self-pay | Admitting: Gastroenterology

## 2014-03-03 DIAGNOSIS — K560 Paralytic ileus: Secondary | ICD-10-CM

## 2014-03-03 LAB — CBC
HEMATOCRIT: 34.9 % — AB (ref 39.0–52.0)
Hemoglobin: 11.8 g/dL — ABNORMAL LOW (ref 13.0–17.0)
MCH: 28.9 pg (ref 26.0–34.0)
MCHC: 33.8 g/dL (ref 30.0–36.0)
MCV: 85.3 fL (ref 78.0–100.0)
Platelets: 317 10*3/uL (ref 150–400)
RBC: 4.09 MIL/uL — ABNORMAL LOW (ref 4.22–5.81)
RDW: 14.4 % (ref 11.5–15.5)
WBC: 10.7 10*3/uL — ABNORMAL HIGH (ref 4.0–10.5)

## 2014-03-03 LAB — BASIC METABOLIC PANEL WITH GFR
Anion gap: 12 (ref 5–15)
BUN: 14 mg/dL (ref 6–23)
CO2: 23 meq/L (ref 19–32)
Calcium: 8.6 mg/dL (ref 8.4–10.5)
Chloride: 103 meq/L (ref 96–112)
Creatinine, Ser: 0.93 mg/dL (ref 0.50–1.35)
GFR calc Af Amer: >90 mL/min
GFR calc non Af Amer: 81 mL/min — ABNORMAL LOW
Glucose, Bld: 105 mg/dL — ABNORMAL HIGH (ref 70–99)
Potassium: 4 meq/L (ref 3.7–5.3)
Sodium: 138 meq/L (ref 137–147)

## 2014-03-03 LAB — MAGNESIUM: Magnesium: 1.7 mg/dL (ref 1.5–2.5)

## 2014-03-03 LAB — HEPARIN LEVEL (UNFRACTIONATED)
Heparin Unfractionated: 0.1 IU/mL — ABNORMAL LOW (ref 0.30–0.70)
Heparin Unfractionated: 0.51 IU/mL (ref 0.30–0.70)

## 2014-03-03 MED ORDER — OXYCODONE-ACETAMINOPHEN 5-325 MG PO TABS: 1.0000 | ORAL_TABLET | Freq: Four times a day (QID) | ORAL | Status: DC | PRN

## 2014-03-03 MED ORDER — FLUCONAZOLE 100 MG PO TABS
100.0000 mg | ORAL_TABLET | Freq: Every day | ORAL | Status: DC
  Administered 2014-03-03 – 2014-03-06 (×4): 100 mg via ORAL
  Filled 2014-03-03 (×4): qty 1

## 2014-03-03 MED ORDER — ENOXAPARIN SODIUM 100 MG/ML ~~LOC~~ SOLN
90.0000 mg | SUBCUTANEOUS | Status: AC
  Administered 2014-03-03: 90 mg via SUBCUTANEOUS
  Filled 2014-03-03: qty 1

## 2014-03-03 MED ORDER — ENOXAPARIN SODIUM 100 MG/ML ~~LOC~~ SOLN
90.0000 mg | Freq: Two times a day (BID) | SUBCUTANEOUS | Status: DC
Start: 2014-03-04 — End: 2014-03-04
  Administered 2014-03-04: 90 mg via SUBCUTANEOUS
  Filled 2014-03-03 (×3): qty 1

## 2014-03-03 NOTE — Progress Notes (Signed)
Per day shift report, pt had 1 large BM today after enema. Pt had second large BM this evening. Will continue to monitor. Ronnette Hila, RN

## 2014-03-03 NOTE — Telephone Encounter (Signed)
Caller name: Katharine Look Relation to pt: Spouse  Call back number: [phone]  Reason for call: pt was admitted 8/15 to [facility]. Pt wife Sheriff Rodenberg would like to discuss her husband blood clots.

## 2014-03-03 NOTE — Progress Notes (Addendum)
PROGRESS NOTE    Dustin Ashley ZWC:[id number] DOB: [birth year] DOA: 02/26/2014 PCP: [name], MD  HPI/Brief narrative 73 year old male hospitalized 02/11/14-02/16/14 when he underwent laparoscopic cholecystectomy on 02/15/14 for chronic acalculous cholecystitis, returned on 02/26/14 with complaints of abdominal pain and fever. CT chest showed small PE and CT abdomen/pelvis showed small fluid collection in the gallbladder fossa. Due to concern for abdominal sepsis/peritonitis, patient was transferred to ICU under critical care service on 02/27/14. After improvement, patient was transferred back to the floor to [facility] on 03/03/14.     Assessment/Plan:  1. Abdominal pain (left upper abdominal pain): Unclear etiology-? Secondary to colonic ileus and esophagitis. s/p laparoscopic cholecystectomy 8/4. EGD 8/19 showed white exudates in the distal esophagus, small hiatal hernia and some retained gastric contents. Follow biopsy results. KUB 8/19 showed improving colonic ileus. GI following an input appreciated. Continue bowel regimen and received tap water enema 8/20. Minimize narcotics and mobilize patient. Continue full liquid diet for now. As per previous note, patient had left-sided abdominal pain similar to preop state (prior to laparoscopic cholecystectomy). Surgeons evaluated on 8/16 - no surgical needs/signed off. 2. Colonic ileus: Probably multifactorial secondary to opioids, decreased by mouth intake and immobility. Bowel regimen, minimize opioids and mobilize patient. Potassium and magnesium okay. 3. Acute pulmonary embolism/DVT left peroneal vein: Likely provoked in the setting of recent abdominal surgery. Patient was on heparin infusion but difficult to maintain therapeutic levels. Switched to full dose Lovenox until able to take consistently by mouth at which time start oral agent. 4. Possible healthcare associated pneumonia: Day 6 of 8 Zosyn-continue. Blood cultures negative to date. 5. Hypertension:  Reasonably controlled-currently off medications. 6. 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 Patient has history of renal artery stenosis. 7. Anemia of critical illness: Stable. 8. Acute urinary retention: Foley was removed but had to be replaced secondary to ongoing retention. Minimize opioids and mobilize.  9. History of CAD: Asymptomatic of chest pain. Home medications-atenolol, aspirin and benazepril on hold secondary to problem #1. Consider starting back on atenolol. 10. History of hyperlipidemia 11. Leukocytosis: Resolved 12. Candida esophagitis: Continue fluconazole he had outpatient followup with Dr. Carlean Purl   Code Status: Full Family Communication: Discussed with spouse and son at bedside on 8/20. Disposition Plan: Home when medically stable.   Consultants:  Critical care medicine  Gen. surgery  Procedures:  Foley catheter  Antibiotics:  IV vancomycin 8/15 - 8/16  IV Zosyn 8/15 >8/22   Subjective: Patient had small BM and flatus this morning. Continues to complain of abdominal pain-upper abdomen. Nausea +.  Objective: Filed Vitals:   03/03/14 0253 03/03/14 0454 03/03/14 1300 03/03/14 1405  BP:  147/61 145/56   Pulse:  93 86 85  Temp:  99.4 F (37.4 C) 98.5 F (36.9 C) 98.5 F (36.9 C)  TempSrc:  Oral Oral Oral  Resp:  20 18   Height:      Weight:  94.484 kg (208 lb 4.8 oz)    SpO2: 94% 95% 94%     Intake/Output Summary (Last 24 hours) at 03/03/14 1416 Last data filed at 03/03/14 1326  Gross per 24 hour  Intake 2283.33 ml  Output   1203 ml  Net 1080.33 ml   Filed Weights   03/01/14 0556 03/02/14 0607 03/03/14 0454  Weight: 90.629 kg (199 lb 12.8 oz) 95.119 kg (209 lb 11.2 oz) 94.484 kg (208 lb 4.8 oz)     Exam:  General exam: Pleasant  elderly male lying supine in bed in mild painful distress. Respiratory system: Diminished breath sounds in the bases but otherwise clear to auscultation. No  increased work of breathing. Cardiovascular system: S1 & S2 heard, RRR. No JVD, murmurs, gallops, clicks. Trace bilateral ankle edema. Telemetry: Sinus rhythm. Gastrointestinal system: Abdomen is mildly distended, upper abdomen mild tenderness without peritoneal signs and diminished bowel sounds. Central nervous system: Alert and oriented. No focal neurological deficits. Extremities: Symmetric 5 x 5 power.   Data Reviewed: Basic Metabolic Panel:  Recent Labs Lab 02/27/14 0130 02/28/14 0232 03/01/14 0450 03/02/14 0335 03/03/14 0446  NA 137 137 140 138 138  K 4.3 4.4 4.2 4.2 4.0  CL 100 104 108 104 103  CO2 22 21 19 21 23   GLUCOSE 173* 121* 113* 118* 105*  BUN 23 35* 22 16 14   CREATININE 1.42* 2.05* 1.12 1.00 0.93  CALCIUM 8.9 8.2* 8.3* 8.5 8.6  MG  --   --   --   --  1.7   Liver Function Tests:  Recent Labs Lab 02/26/14 1510 02/28/14 0232 03/01/14 0450 03/02/14 0335  AST 26 20 29 23   ALT 31 28 31 27   ALKPHOS 119* 101 128* 145*  BILITOT 0.7 1.1 1.1 1.3*  PROT 6.7 5.8* 5.8* 5.8*  ALBUMIN 3.1* 2.3* 2.1* 2.0*    Recent Labs Lab 02/26/14 1510 03/01/14 1100  LIPASE 27 14  AMYLASE  --  16   No results found for this basename: AMMONIA,  in the last 168 hours CBC:  Recent Labs Lab 02/26/14 1510  02/27/14 1033 02/28/14 0232 03/01/14 0450 03/02/14 0335 03/03/14 0446  WBC 18.0*  < > 24.5* 18.2* 14.6* 11.0* 10.7*  NEUTROABS 15.5*  --   --   --  12.8*  --   --   HGB 14.0  < > 14.1 12.0* 12.3* 11.9* 11.8*  HCT 41.8  < > 42.9 36.8* 36.7* 35.8* 34.9*  MCV 84.4  < > 87.7 87.0 86.4 85.0 85.3  PLT 272  < > 285 243 253 314 317  < > = values in this interval not displayed. Cardiac Enzymes: No results found for this basename: CKTOTAL, CKMB, CKMBINDEX, TROPONINI,  in the last 168 hours BNP (last 3 results)  Recent Labs  02/27/14 1105  PROBNP 2847.0*   CBG:  Recent Labs Lab 02/27/14 0812 02/27/14 1141  GLUCAP 134* 122*    Recent Results (from the past 240  hour(s))  CULTURE, BLOOD (ROUTINE X 2)     Status: None   Collection Time    02/26/14  6:30 PM      Result Value Ref Range Status   Specimen Description BLOOD RIGHT ARM   Final   Special Requests BOTTLES DRAWN AEROBIC AND ANAEROBIC 10CC   Final   Culture  Setup Time     Final   Value: 02/27/2014 03:40     Performed at [facility]   Culture     Final   Value:        BLOOD CULTURE RECEIVED NO GROWTH TO DATE CULTURE WILL BE HELD FOR 5 DAYS BEFORE ISSUING A FINAL NEGATIVE REPORT     Performed at [facility]   Report Status PENDING   Incomplete  CULTURE, BLOOD (ROUTINE X 2)     Status: None   Collection Time    02/26/14  6:36 PM      Result Value Ref Range Status   Specimen Description BLOOD RIGHT HAND   Final  Special Requests BOTTLES DRAWN AEROBIC ONLY 10CC   Final   Culture  Setup Time     Final   Value: 02/27/2014 03:40     Performed at [facility]   Culture     Final   Value:        BLOOD CULTURE RECEIVED NO GROWTH TO DATE CULTURE WILL BE HELD FOR 5 DAYS BEFORE ISSUING A FINAL NEGATIVE REPORT     Performed at [facility]   Report Status PENDING   Incomplete       Studies: Dg Abd 2 Views  03/02/2014   CLINICAL DATA:  Abdominal pain.  Evaluate for ileus.  EXAM: ABDOMEN - 2 VIEW  COMPARISON:  03/01/2014; CT abdomen pelvis - 02/26/2014  FINDINGS: There has been interval passage of some of the previously ingested enteric contrast with minimal amount of residual contrast seen within predominantly the splenic flexure of the colon. There is persistent mild gas distention of the colon without upstream dilatation of the small bowel. No pneumoperitoneum, pneumatosis or portal venous gas.  Multiple phleboliths and vascular calcifications overlie the lower pelvis bilaterally. No definite abnormal intra-abdominal calcifications. No acute osseus abnormalities. Post median sternotomy.  IMPRESSION: Suspected improving colonic ileus.   Electronically Signed   By:  Sandi Mariscal M.D.   On: 03/02/2014 13:36        Scheduled Meds: . antiseptic oral rinse  7 mL Mouth Rinse q12n4p  . chlorhexidine  15 mL Mouth Rinse BID  . docusate sodium  100 mg Oral BID  . pantoprazole  40 mg Oral BID  . piperacillin-tazobactam (ZOSYN)  IV  3.375 g Intravenous Q8H  . polyethylene glycol  17 g Oral BID  . sodium chloride  3 mL Intravenous Q12H   Continuous Infusions: . sodium chloride 50 mL/hr at 03/03/14 0130  . heparin 2,700 Units/hr (03/03/14 0837)    Principal Problem:   Pulmonary embolism Active Problems:   HYPERLIPIDEMIA   HYPERTENSION   CORONARY ARTERY DISEASE   HYPERGLYCEMIA   Chronic renal insufficiency, stage II (mild)   HCAP (healthcare-associated pneumonia)   Leukocytosis   SIRS (systemic inflammatory response syndrome)   Abdominal pain, other specified site   Nonspecific (abnormal) findings on radiological and other examination of gastrointestinal tract    Time spent: 40 minutes.    Vernell Leep, MD, FACP, FHM. [facility] Pager [phone]  If 7PM-7AM, please contact night-coverage [email or web address] Password [email or web address] 03/03/2014, 2:16 PM    LOS: 5 days

## 2014-03-03 NOTE — Telephone Encounter (Signed)
Would you like for them to schedule a hospital F/U appt?    Please Advise.

## 2014-03-03 NOTE — Telephone Encounter (Signed)
I spoke with the patient's wife, she is concerned because this is the second admission in the last few weeks. He was readmitted after the GB surgery with a diagnosis of PE and pneumonia. He had EGD today. Labs are stable, he is out of  ICU. I reassured her, Urian  is under the care of internal medicine and GI which is appropriate, he is in good hands and base of the trending down WBCs he seems to be improving. Will communicate again at any time if needed

## 2014-03-03 NOTE — Progress Notes (Signed)
ANTICOAGULATION CONSULT NOTE - Follow Up Consult  Pharmacy Consult for Heparin to Lovenox Indication: pulmonary embolus and DVT  Allergies  Allergen Reactions  . Hydrocodone-Acetaminophen Other (See Comments)    REACTION: bladder obstruction  . Tramadol Hcl Other (See Comments)    REACTION: bladder obstruction    Patient Measurements: Height: 5\' 10"  (177.8 cm) Weight: 208 lb 4.8 oz (94.484 kg) (c scale) IBW/kg (Calculated) : 73  Vital Signs: Temp: 98.5 F (36.9 C) (08/20 1405) Temp src: Oral (08/20 1405) BP: 145/56 mmHg (08/20 1300) Pulse Rate: 85 (08/20 1405)  Labs:  Recent Labs  03/01/14 0450  03/02/14 0335 03/02/14 0716 03/02/14 1958 03/03/14 0446  HGB 12.3*  --  11.9*  --   --  11.8*  HCT 36.7*  --  35.8*  --   --  34.9*  PLT 253  --  314  --   --  317  HEPARINUNFRC <0.10*  < >  --  <0.10* 0.39 0.10*  CREATININE 1.12  --  1.00  --   --  0.93  < > = values in this interval not displayed.  Estimated Creatinine Clearance: 81.6 ml/min (by C-G formula based on Cr of 0.93).   Medications:  Heparin 2450 units/hr  Assessment: 73 y/o M on heparin for PE/DVT.  Switching to Lovenox  Goal of Therapy:   Monitor platelets by anticoagulation protocol: Yes   Plan:  -Lovenox 90 mg sq Q 12 hours -Follow CBC -Follow up for po regimen  Thank you. Anette Guarneri, PharmD Tad Moore 03/03/2014,2:27 PM

## 2014-03-03 NOTE — Progress Notes (Signed)
ANTICOAGULATION CONSULT NOTE - Follow Up Consult  Pharmacy Consult for Heparin  Indication: pulmonary embolus and DVT  Allergies  Allergen Reactions  . Hydrocodone-Acetaminophen Other (See Comments)    REACTION: bladder obstruction  . Tramadol Hcl Other (See Comments)    REACTION: bladder obstruction    Patient Measurements: Height: 5\' 10"  (177.8 cm) Weight: 208 lb 4.8 oz (94.484 kg) (c scale) IBW/kg (Calculated) : 73  Vital Signs: Temp: 99.4 F (37.4 C) (08/20 0454) Temp src: Oral (08/20 0454) BP: 147/61 mmHg (08/20 0454) Pulse Rate: 93 (08/20 0454)  Labs:  Recent Labs  03/01/14 0450  03/02/14 0335 03/02/14 0716 03/02/14 1958 03/03/14 0446  HGB 12.3*  --  11.9*  --   --  11.8*  HCT 36.7*  --  35.8*  --   --  34.9*  PLT 253  --  314  --   --  317  HEPARINUNFRC <0.10*  < >  --  <0.10* 0.39 0.10*  CREATININE 1.12  --  1.00  --   --  0.93  < > = values in this interval not displayed.  Estimated Creatinine Clearance: 81.6 ml/min (by C-G formula based on Cr of 0.93).   Medications:  Heparin 2450 units/hr  Assessment: 73 y/o M on heparin for PE/DVT. HL is sub-therapeutic. No issues per RN.   Goal of Therapy:  Heparin level 0.3-0.7 units/ml Monitor platelets by anticoagulation protocol: Yes   Plan:  -Increase heparin drip to 2700 units/hr -1400 HL -Daily CBC/HL -Monitor for bleeding  Narda Bonds 03/03/2014,6:48 AM

## 2014-03-03 NOTE — Progress Notes (Signed)
Daily Rounding Note  03/03/2014, 8:30 AM  LOS: 5 days   SUBJECTIVE:       Now hurting in left mid/lower abdomen.  No BM yet, go dulcolax PR x one (second ordered if no results after first but was not given).  Passed some flatus this AM otherwise not much flatus. No nausea, no epigastric pain. Last percocet was at 1800 yesterday. No c/o SOB  OBJECTIVE:         Vital signs in last 24 hours:    Temp:  [98.2 F (36.8 C)-99.4 F (37.4 C)] 99.4 F (37.4 C) (08/20 0454) Pulse Rate:  [75-93] 93 (08/20 0454) Resp:  [9-20] 20 (08/20 0454) BP: (121-157)/(43-88) 147/61 mmHg (08/20 0454) SpO2:  [89 %-99 %] 95 % (08/20 0454) Weight:  [94.484 kg (208 lb 4.8 oz)] 94.484 kg (208 lb 4.8 oz) (08/20 0454) Last BM Date: 02/25/14 General: somewhat somnolent, uncomfortable   Heart: RRR Chest: clear bil.  No cough Abdomen: distended, tight, tender over RUQ, minor tenderness in lower abdomen bil.  BS scant.  Extremities: + edema Neuro/Psych:  No tremor, no limb weakness.    Intake/Output from previous day: 2023-03-25 0701 - 08/20 0700 In: 2663.3 [P.O.:120; I.V.:2543.3] Out: 971 [Urine:970; Emesis/NG output:1]  Intake/Output this shift:    Lab Results:  Recent Labs  03/01/14 0450 03/24/14 0335 03/03/14 0446  WBC 14.6* 11.0* 10.7*  HGB 12.3* 11.9* 11.8*  HCT 36.7* 35.8* 34.9*  PLT 253 314 317   BMET  Recent Labs  03/01/14 0450 March 24, 2014 0335 03/03/14 0446  NA 140 138 138  K 4.2 4.2 4.0  CL 108 104 103  CO2 19 21 23   GLUCOSE 113* 118* 105*  BUN 22 16 14   CREATININE 1.12 1.00 0.93  CALCIUM 8.3* 8.5 8.6   LFT  Recent Labs  03/01/14 0450 Mar 24, 2014 0335  PROT 5.8* 5.8*  ALBUMIN 2.1* 2.0*  AST 29 23  ALT 31 27  ALKPHOS 128* 145*  BILITOT 1.1 1.3*  BILIDIR  --  0.7*  IBILI  --  0.6    Studies/Results: Dg Abd 2 Views  03/24/2014   CLINICAL DATA:  Abdominal pain.  Evaluate for ileus.  EXAM: ABDOMEN - 2 VIEW   COMPARISON:  03/01/2014; CT abdomen pelvis - 02/26/2014  FINDINGS: There has been interval passage of some of the previously ingested enteric contrast with minimal amount of residual contrast seen within predominantly the splenic flexure of the colon. There is persistent mild gas distention of the colon without upstream dilatation of the small bowel. No pneumoperitoneum, pneumatosis or portal venous gas.  Multiple phleboliths and vascular calcifications overlie the lower pelvis bilaterally. No definite abnormal intra-abdominal calcifications. No acute osseus abnormalities. Post median sternotomy.  IMPRESSION: Suspected improving colonic ileus.   Electronically Signed   By: Sandi Mariscal M.D.   On: 03-24-14 13:36   Dg Abd Portable 1v  03/01/2014   CLINICAL DATA:  Abdominal pain and discomfort, question bowel obstruction, no recent bowel movement, history hypertension, GERD  EXAM: PORTABLE ABDOMEN - 1 VIEW  COMPARISON:  CT abdomen and pelvis 02/26/2014  FINDINGS: Contrast in RIGHT colon.  Gas throughout remainder of colon.  Minimal gas in rectum.  No small bowel dilatation, evidence of bowel wall thickening or bowel obstruction.  Atelectasis versus consolidation LEFT lower lobe with minimal RIGHT basilar atelectasis.  Slight gaseous distention of stomach.  Bones demineralized.  No definite urinary tract calcification.  IMPRESSION: Nonobstructive bowel gas pattern.  Atelectasis versus consolidation LEFT lower lobe.   Electronically Signed   By: Lavonia Dana M.D.   On: 03/01/2014 10:15   Scheduled Meds: . antiseptic oral rinse  7 mL Mouth Rinse q12n4p  . chlorhexidine  15 mL Mouth Rinse BID  . docusate sodium  100 mg Oral BID  . pantoprazole  40 mg Oral BID  . piperacillin-tazobactam (ZOSYN)  IV  3.375 g Intravenous Q8H  . polyethylene glycol  17 g Oral BID  . sodium chloride  3 mL Intravenous Q12H   Continuous Infusions: . sodium chloride 50 mL/hr at 03/03/14 0130  . sodium chloride 20 mL/hr at 03/01/14  2313  . dextrose 5% lactated ringers    . heparin 2,700 Units/hr (03/03/14 0705)  . lactated ringers     PRN Meds:.acetaminophen, albuterol, alum & mag hydroxide-simeth, bisacodyl, diazepam, ondansetron (ZOFRAN) IV, oxyCODONE-acetaminophen, promethazine, sodium chloride   ASSESMENT:   * Upper abdomiinal, LUQ pain. Persist despite lap chole over 2 weeks ago.  EGD  8/19: white exudates at distal esophagus. Candida esophagitis on esophageal biopsies, small HH, retained gastric contents.  * Constipation, worse with added narcotics. Persists despite Miralax and colace.  * Mild colonic ileus on xray.  On Miralax, colace, prn bisacodyl.  Received bisacodyl po and PR yesterday.  * PNA, assume HCAP. On Zosyn, previously Vanc.  * Small PE: on Heparin drip. * Urinary retention. Foley in place but oliguric at 970 CC and 900 cc per day in last 2 days.      PLAN   *  Tap water enema.  If no significant results, will repeat KUB.  *  Minimize narcotics.  *  Continue Miralax bid for now *  Diflucan for 7 days    Azucena Freed  03/03/2014, 8:30 AM Pager: [phone]     Attending physician's note   I have taken an interval history, reviewed the chart and examined the patient. I agree with the Advanced Practitioner's note, impression and recommendations. He had a relieving bowel movement post enema today. His abdomen is no longer tender or distended.  Continue to minimize opioid pain medications, continue Miralax BID for now, continue full liquids for now, repeat enemas if no additional BMs by tomorrow morning. Diflucan for candida esophagitis. GI signing off. Outpatient GI follow up with Dr. Silvano Rusk as needed.  [name]. Fuller Plan, MD [facility]

## 2014-03-04 MED ORDER — RIVAROXABAN 20 MG PO TABS: 20.0000 mg | ORAL_TABLET | Freq: Every day | ORAL | Status: DC

## 2014-03-04 MED ORDER — LEVOFLOXACIN 750 MG PO TABS
750.0000 mg | ORAL_TABLET | Freq: Every day | ORAL | Status: AC
  Administered 2014-03-04 – 2014-03-05 (×2): 750 mg via ORAL
  Filled 2014-03-04 (×2): qty 1

## 2014-03-04 MED ORDER — ACETAMINOPHEN 325 MG PO TABS
650.0000 mg | ORAL_TABLET | Freq: Four times a day (QID) | ORAL | Status: DC | PRN
  Administered 2014-03-05 (×3): 650 mg via ORAL
  Filled 2014-03-04 (×3): qty 2

## 2014-03-04 MED ORDER — FUROSEMIDE 20 MG PO TABS
20.0000 mg | ORAL_TABLET | Freq: Once | ORAL | Status: AC
  Administered 2014-03-04: 20 mg via ORAL
  Filled 2014-03-04: qty 1

## 2014-03-04 MED ORDER — RIVAROXABAN 15 MG PO TABS
15.0000 mg | ORAL_TABLET | Freq: Two times a day (BID) | ORAL | Status: DC
  Administered 2014-03-04 – 2014-03-06 (×4): 15 mg via ORAL
  Filled 2014-03-04 (×6): qty 1

## 2014-03-04 NOTE — Progress Notes (Signed)
ANTICOAGULATION CONSULT NOTE - Follow Up Consult  Pharmacy Consult for Lovenox >> Xarelto Indication: PE/DVT  Allergies  Allergen Reactions  . Hydrocodone-Acetaminophen Other (See Comments)    REACTION: bladder obstruction  . Tramadol Hcl Other (See Comments)    REACTION: bladder obstruction    Patient Measurements: Height: 5\' 10"  (177.8 cm) Weight: 209 lb 3.5 oz (94.9 kg) (scale B) IBW/kg (Calculated) : 73  Vital Signs: Temp: 98.7 F (37.1 C) (08/21 1421) Temp src: Oral (08/21 1421) BP: 153/56 mmHg (08/21 1421) Pulse Rate: 79 (08/21 1421)  Labs:  Recent Labs  03/02/14 0335  03/02/14 1958 03/03/14 0446 03/03/14 1342  HGB 11.9*  --   --  11.8*  --   HCT 35.8*  --   --  34.9*  --   PLT 314  --   --  317  --   HEPARINUNFRC  --   < > 0.39 0.10* 0.51  CREATININE 1.00  --   --  0.93  --   < > = values in this interval not displayed.  Estimated Creatinine Clearance: 81.8 ml/min (by C-G formula based on Cr of 0.93).   Assessment: 19 YOM who presented on 8/15 with abdominal pain and fever and upon work up was found to have a PE/DVT. Initially the patient was started on heparin from 8/15 - 8/20 and then transitioned to lovenox yesterday evening. Now the MD has decided to transition from lovenox to Xarelto. The patient's last lovenox dose was at 0500 this AM, will plan to start Xarelto this evening. SCr 0.93, CrCl~80 ml/min.   The patient and his wife were educated on Xarelto this evening - all questions were addressed.   Goal of Therapy:  Appropriate anticoagulation   Plan:  1. Discontinue Lovenox 2. Start Xarelto 15 mg bid x 21 days (thru 9/10) 3. Transition to Xarelto 20 mg daily with supper on 9/11 4. Will plan to monitor for s/sx of bleeding  Alycia Rossetti, PharmD, BCPS Clinical Pharmacist Pager: [phone] 03/04/2014 7:32 PM

## 2014-03-04 NOTE — Discharge Instructions (Signed)
Information on my medicine - XARELTO (rivaroxaban)  This medication education was reviewed with me or my healthcare representative as part of my discharge preparation.  The pharmacist that spoke with me during my hospital stay was:  Lawson Radar, Tice? Xarelto was prescribed to treat blood clots that may have been found in the veins of your legs (deep vein thrombosis) or in your lungs (pulmonary embolism) and to reduce the risk of them occurring again.  What do you need to know about Xarelto? The starting dose is one 15 mg tablet taken TWICE daily with food for the FIRST 21 DAYS then on (enter date)  Friday, September 11th, 2015  the dose is changed to one 20 mg tablet taken ONCE A DAY with your evening meal.  DO NOT stop taking Xarelto without talking to the health care provider who prescribed the medication.  Refill your prescription for 20 mg tablets before you run out.  After discharge, you should have regular check-up appointments with your healthcare provider that is prescribing your Xarelto.  In the future your dose may need to be changed if your kidney function changes by a significant amount.  What do you do if you miss a dose? If you are taking Xarelto TWICE DAILY and you miss a dose, take it as soon as you remember. You may take two 15 mg tablets (total 30 mg) at the same time then resume your regularly scheduled 15 mg twice daily the next day.  If you are taking Xarelto ONCE DAILY and you miss a dose, take it as soon as you remember on the same day then continue your regularly scheduled once daily regimen the next day. Do not take two doses of Xarelto at the same time.   Important Safety Information Xarelto is a blood thinner medicine that can cause bleeding. You should call your healthcare provider right away if you experience any of the following:   Bleeding from an injury or your nose that does not stop.   Unusual colored  urine (red or dark brown) or unusual colored stools (red or black).   Unusual bruising for unknown reasons.   A serious fall or if you hit your head (even if there is no bleeding).  Some medicines may interact with Xarelto and might increase your risk of bleeding while on Xarelto. To help avoid this, consult your healthcare provider or pharmacist prior to using any new prescription or non-prescription medications, including herbals, vitamins, non-steroidal anti-inflammatory drugs (NSAIDs) and supplements.  This website has more information on Xarelto: [email or web address]

## 2014-03-04 NOTE — Progress Notes (Signed)
PT Cancellation Note  Patient Details Name: JAELYN CLONINGER MRN: [id number] DOB: 15-Apr-1941   Cancelled Treatment:    Reason Eval Not Completed: Patient politely declined as he just got back in bed and reports he has been up to use bathroom several times today and sat up in recliner. He agreed to work with PT this afternoon. Will return   Berta Denson 03/04/2014, 12:23 PM Pager [phone]

## 2014-03-04 NOTE — Progress Notes (Signed)
PROGRESS NOTE    Dustin Ashley VXB:[id number] DOB: Mar 20, 1941 DOA: 02/26/2014 PCP: [name], MD  HPI/Brief narrative 73 year old male hospitalized 02/11/14-02/16/14 when he underwent laparoscopic cholecystectomy on 02/15/14 for chronic acalculous cholecystitis, returned on 02/26/14 with complaints of abdominal pain and fever. CT chest showed small PE and CT abdomen/pelvis showed small fluid collection in the gallbladder fossa. Due to concern for abdominal sepsis/peritonitis, patient was transferred to ICU under critical care service on 02/27/14. After improvement, patient was transferred back to the floor to [facility] on 03/03/14.     Assessment/Plan:  1. Abdominal pain (left upper abdominal pain): Unclear etiology-? Secondary to colonic ileus and esophagitis. s/p laparoscopic cholecystectomy 8/4. EGD 8/19 showed white exudates in the distal esophagus, small hiatal hernia and some retained gastric contents. Follow biopsy results. KUB 8/19 showed improving colonic ileus. GI signed off. Abdominal pain resolved on 8/21. Patient had multiple BMs in the last 24 hours. Discontinue narcotics and mobilize patient. Advance to soft diet. As per previous note, patient had left-sided abdominal pain similar to preop state (prior to laparoscopic cholecystectomy). Surgeons evaluated on 8/16 - no surgical needs/signed off. 2. Colonic ileus: Probably multifactorial secondary to opioids, decreased by mouth intake and immobility. Bowel regimen, minimize opioids and mobilize patient. Potassium and magnesium okay. Clinically resolved. Advance diet. 3. Acute pulmonary embolism/DVT left peroneal vein: Likely provoked in the setting of recent abdominal surgery. Patient was on heparin infusion but difficult to maintain therapeutic levels. Switched to full dose Lovenox until able to take consistently by mouth at which time start oral agent. Will switch to Xarelto on 8/21 (obtained preauthorization: Case # for 15 mg tabs-[place] [id number] &  case [place] for 20 mg tablets- [place] [id number]). 4. Possible healthcare associated pneumonia: Initially treated with Zosyn and then switched to oral levofloxacin to complete total 8 days treatment. Blood cultures negative to date. 5. Hypertension: Reasonably controlled-currently off medications. 6. 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 Patient has history of renal artery stenosis. 7. Anemia of critical illness: Stable. 8. Acute urinary retention: Foley was removed but had to be replaced secondary to ongoing retention. DC Foley 8/21 and monitor for voiding. Opioids discontinued. 9. History of CAD: Asymptomatic of chest pain. Home medications-atenolol, aspirin and benazepril on hold secondary to problem #1. Consider starting back on atenolol. 10. History of hyperlipidemia 11. Leukocytosis: Resolved   Code Status: Full Family Communication: Discussed with spouse and son at bedside on 8/21. Disposition Plan: Home when medically stable.   Consultants:  Critical care medicine  Gen. surgery  Procedures:  Foley catheter-DC 8/21  Antibiotics:  IV vancomycin 8/15 - 8/16  IV Zosyn 8/15 >8/22   By mouth levofloxacin  Subjective: 3 large BM's over last 24 hours. No abdominal pain. Tolerating diet. Ambulating with assistance.  Objective: Filed Vitals:   03/03/14 2020 03/04/14 0657 03/04/14 0751 03/04/14 1421  BP: 148/52 150/51  153/56  Pulse: 84 80  79  Temp: 98.2 F (36.8 C) 98.7 F (37.1 C)  98.7 F (37.1 C)  TempSrc: Oral Oral  Oral  Resp: 18 20  20   Height:      Weight:   94.9 kg (209 lb 3.5 oz)   SpO2: 94% 91%  94%    Intake/Output Summary (Last 24 hours) at 03/04/14 1707 Last data filed at 03/04/14 1422  Gross per 24 hour  Intake 1963.33 ml  Output   1376 ml  Net 587.33 ml   [employer]  03/02/14 0607 03/03/14 0454 03/04/14 0751  Weight: 95.119 kg (209 lb 11.2 oz) 94.484 kg (208 lb 4.8 oz) 94.9 kg (209 lb 3.5 oz)       Exam:  General exam: Pleasant elderly male sitting up comfortably on chair this morning. Respiratory system: clear to auscultation. No increased work of breathing. Cardiovascular system: S1 & S2 heard, RRR. No JVD, murmurs, gallops, clicks. 1+ pitting bilateral ankle edema. Telemetry: Sinus rhythm. Gastrointestinal system: Abdomen is mildly distended, upper abdomen mild tenderness without peritoneal signs and diminished bowel sounds. Central nervous system: Alert and oriented. No focal neurological deficits. Extremities: Symmetric 5 x 5 power.   Data Reviewed: Basic Metabolic Panel:  Recent Labs Lab 02/27/14 0130 02/28/14 0232 03/01/14 0450 03/02/14 0335 03/03/14 0446  NA 137 137 140 138 138  K 4.3 4.4 4.2 4.2 4.0  CL 100 104 108 104 103  CO2 22 21 19 21 23   GLUCOSE 173* 121* 113* 118* 105*  BUN 23 35* 22 16 14   CREATININE 1.42* 2.05* 1.12 1.00 0.93  CALCIUM 8.9 8.2* 8.3* 8.5 8.6  MG  --   --   --   --  1.7   Liver Function Tests:  Recent Labs Lab 02/26/14 1510 02/28/14 0232 03/01/14 0450 03/02/14 0335  AST 26 20 29 23   ALT 31 28 31 27   ALKPHOS 119* 101 128* 145*  BILITOT 0.7 1.1 1.1 1.3*  PROT 6.7 5.8* 5.8* 5.8*  ALBUMIN 3.1* 2.3* 2.1* 2.0*    Recent Labs Lab 02/26/14 1510 03/01/14 1100  LIPASE 27 14  AMYLASE  --  16   No results found for this basename: AMMONIA,  in the last 168 hours CBC:  Recent Labs Lab 02/26/14 1510  02/27/14 1033 02/28/14 0232 03/01/14 0450 03/02/14 0335 03/03/14 0446  WBC 18.0*  < > 24.5* 18.2* 14.6* 11.0* 10.7*  NEUTROABS 15.5*  --   --   --  12.8*  --   --   HGB 14.0  < > 14.1 12.0* 12.3* 11.9* 11.8*  HCT 41.8  < > 42.9 36.8* 36.7* 35.8* 34.9*  MCV 84.4  < > 87.7 87.0 86.4 85.0 85.3  PLT 272  < > 285 243 253 314 317  < > = values in this interval not displayed. Cardiac Enzymes: No results found for this basename: CKTOTAL, CKMB, CKMBINDEX, TROPONINI,  in the last 168 hours BNP (last 3 results)  Recent Labs   02/27/14 1105  PROBNP 2847.0*   CBG:  Recent Labs Lab 02/27/14 0812 02/27/14 1141  GLUCAP 134* 122*    Recent Results (from the past 240 hour(s))  CULTURE, BLOOD (ROUTINE X 2)     Status: None   Collection Time    02/26/14  6:30 PM      Result Value Ref Range Status   Specimen Description BLOOD RIGHT ARM   Final   Special Requests BOTTLES DRAWN AEROBIC AND ANAEROBIC 10CC   Final   Culture  Setup Time     Final   Value: 02/27/2014 03:40     Performed at [facility]   Culture     Final   Value:        BLOOD CULTURE RECEIVED NO GROWTH TO DATE CULTURE WILL BE HELD FOR 5 DAYS BEFORE ISSUING A FINAL NEGATIVE REPORT     Performed at [facility]   Report Status PENDING   Incomplete  CULTURE, BLOOD (ROUTINE X 2)     Status: None   Collection Time  02/26/14  6:36 PM      Result Value Ref Range Status   Specimen Description BLOOD RIGHT HAND   Final   Special Requests BOTTLES DRAWN AEROBIC ONLY 10CC   Final   Culture  Setup Time     Final   Value: 02/27/2014 03:40     Performed at [facility]   Culture     Final   Value:        BLOOD CULTURE RECEIVED NO GROWTH TO DATE CULTURE WILL BE HELD FOR 5 DAYS BEFORE ISSUING A FINAL NEGATIVE REPORT     Performed at [facility]   Report Status PENDING   Incomplete       Studies: No results found.      Scheduled Meds: . antiseptic oral rinse  7 mL Mouth Rinse q12n4p  . chlorhexidine  15 mL Mouth Rinse BID  . docusate sodium  100 mg Oral BID  . fluconazole  100 mg Oral Daily  . levofloxacin  750 mg Oral Daily  . pantoprazole  40 mg Oral BID  . polyethylene glycol  17 g Oral BID  . sodium chloride  3 mL Intravenous Q12H   Continuous Infusions:    Principal Problem:   Pulmonary embolism Active Problems:   HYPERLIPIDEMIA   HYPERTENSION   CORONARY ARTERY DISEASE   HYPERGLYCEMIA   Chronic renal insufficiency, stage II (mild)   HCAP (healthcare-associated pneumonia)   Leukocytosis   SIRS  (systemic inflammatory response syndrome)   Abdominal pain, other specified site   Nonspecific (abnormal) findings on radiological and other examination of gastrointestinal tract    Time spent: 30 minutes.    Vernell Leep, MD, FACP, FHM. [facility] Pager [phone]  If 7PM-7AM, please contact night-coverage [email or web address] Password [email or web address] 03/04/2014, 5:07 PM    LOS: 6 days

## 2014-03-04 NOTE — Progress Notes (Signed)
PT Cancellation Note  Patient Details Name: Dustin Ashley MRN: [id number] DOB: [birth year]   Cancelled Treatment:    Reason Eval Not Completed: Patient declined; reported he walked down hall with nursing this afternoon and is too fatigued to work with PT now. Agreed to work with PT 8/22   Lyndzie Zentz 03/04/2014, 4:49 PM Pager [phone]

## 2014-03-04 NOTE — Progress Notes (Signed)
ANTIBIOTIC CONSULT NOTE - INITIAL  Pharmacy Consult for levaquin Indication: rule out pneumonia  Allergies  Allergen Reactions  . Hydrocodone-Acetaminophen Other (See Comments)    REACTION: bladder obstruction  . Tramadol Hcl Other (See Comments)    REACTION: bladder obstruction    Patient Measurements: Height: 5\' 10"  (177.8 cm) Weight: 209 lb 3.5 oz (94.9 kg) (scale B) IBW/kg (Calculated) : 73   Vital Signs: Temp: 98.7 F (37.1 C) (08/21 0657) Temp src: Oral (08/21 0657) BP: 150/51 mmHg (08/21 0657) Pulse Rate: 80 (08/21 0657) Intake/Output from previous day: 08/20 0701 - 08/21 0700 In: 2183.3 [P.O.:540; I.V.:1193.3; IV Piggyback:450] Out: 2595 [Urine:1400; Emesis/NG output:2] Intake/Output from this shift: Total I/O In: 360 [P.O.:360] Out: -   Labs:  Recent Labs  03/02/14 0335 03/03/14 0446  WBC 11.0* 10.7*  HGB 11.9* 11.8*  PLT 314 317  CREATININE 1.00 0.93   Estimated Creatinine Clearance: 81.8 ml/min (by C-G formula based on Cr of 0.93). No results found for this basename: VANCOTROUGH, Corlis Leak, VANCORANDOM, GENTTROUGH, GENTPEAK, GENTRANDOM, TOBRATROUGH, TOBRAPEAK, TOBRARND, AMIKACINPEAK, AMIKACINTROU, AMIKACIN,  in the last 72 hours   Microbiology: Recent Results (from the past 720 hour(s))  SURGICAL PCR SCREEN     Status: None   Collection Time    02/13/14  3:13 PM      Result Value Ref Range Status   MRSA, PCR NEGATIVE  NEGATIVE Final   Staphylococcus aureus NEGATIVE  NEGATIVE Final   Comment:            The Xpert SA Assay (FDA     approved for NASAL specimens     in patients over 25 years of age),     is one component of     a comprehensive surveillance     program.  Test performance has     been validated by [employer] for patients greater     than or equal to 31 year old.     It is not intended     to diagnose infection nor to     guide or monitor treatment.  CULTURE, BLOOD (ROUTINE X 2)     Status: None   Collection Time   02/26/14  6:30 PM      Result Value Ref Range Status   Specimen Description BLOOD RIGHT ARM   Final   Special Requests BOTTLES DRAWN AEROBIC AND ANAEROBIC 10CC   Final   Culture  Setup Time     Final   Value: 02/27/2014 03:40     Performed at [facility]   Culture     Final   Value:        BLOOD CULTURE RECEIVED NO GROWTH TO DATE CULTURE WILL BE HELD FOR 5 DAYS BEFORE ISSUING A FINAL NEGATIVE REPORT     Performed at [facility]   Report Status PENDING   Incomplete  CULTURE, BLOOD (ROUTINE X 2)     Status: None   Collection Time    02/26/14  6:36 PM      Result Value Ref Range Status   Specimen Description BLOOD RIGHT HAND   Final   Special Requests BOTTLES DRAWN AEROBIC ONLY 10CC   Final   Culture  Setup Time     Final   Value: 02/27/2014 03:40     Performed at [facility]   Culture     Final   Value:        BLOOD CULTURE RECEIVED NO GROWTH TO  DATE CULTURE WILL BE HELD FOR 5 DAYS BEFORE ISSUING A FINAL NEGATIVE REPORT     Performed at [facility]   Report Status PENDING   Incomplete    Medical History: Past Medical History  Diagnosis Date  . HOH (hard of hearing)     has a hearing aid L, sees audiology rountinely  . Allergic rhinitis   . CAD (coronary artery disease)   . GERD (gastroesophageal reflux disease)   . Hyperlipemia   . Hypertension   . Hyperglycemia     A1C 5.8 04-2010  . Pain, joint, multiple sites     uses valium, occ uses for insomnia. uses for shoulder  pain  . Chronic renal insufficiency, stage II (mild)     Cr ~ 1.4, u/s 4-12 normal kidneys  . 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 Personal history of colonic polyps     Medications:  Prescriptions prior to admission  Medication Sig Dispense Refill  . amLODipine (NORVASC) 10 MG tablet Take 10 mg by mouth daily.      Marland Kitchen aspirin 81 MG tablet Take 81 mg by mouth daily.        Marland Kitchen atenolol (TENORMIN) 50 MG tablet Take 75 mg by mouth daily.      Marland Kitchen atorvastatin (LIPITOR)  20 MG tablet Take 20 mg by mouth daily.       . benazepril (LOTENSIN) 20 MG tablet Take 20 mg by mouth 2 (two) times daily.      . diazepam (VALIUM) 10 MG tablet Take 10 mg by mouth every 12 (twelve) hours as needed for anxiety.      Marland Kitchen esomeprazole (NEXIUM) 40 MG capsule Take 1 capsule (40 mg total) by mouth daily as needed.  90 capsule  1  . fexofenadine (ALLEGRA) 180 MG tablet Take 180 mg by mouth daily.        Marland Kitchen ketorolac (TORADOL) 10 MG tablet Take 1 tablet (10 mg total) by mouth every 8 (eight) hours as needed for severe pain.  10 tablet  0  . niacin (NIASPAN) 500 MG CR tablet Take 500 mg by mouth at bedtime.       Assessment: 73 yo man to complete antibiotic course with levaquin for 2 more days.  His CrCl ~80 ml/min.  Goal of Therapy:  Eradication of infection  Plan:  Levaquin 750 mg po daily x 2 days  Woodard Perrell Poteet 03/04/2014,10:44 AM

## 2014-03-05 DIAGNOSIS — I4891 Unspecified atrial fibrillation: Secondary | ICD-10-CM

## 2014-03-05 LAB — BASIC METABOLIC PANEL
Anion gap: 12 (ref 5–15)
BUN: 11 mg/dL (ref 6–23)
CALCIUM: 8.4 mg/dL (ref 8.4–10.5)
CO2: 26 meq/L (ref 19–32)
CREATININE: 0.91 mg/dL (ref 0.50–1.35)
Chloride: 103 mEq/L (ref 96–112)
GFR calc Af Amer: >90 mL/min (ref >90)
GFR calc non Af Amer: 82 mL/min — ABNORMAL LOW (ref >90)
GLUCOSE: 112 mg/dL — AB (ref 70–99)
Potassium: 3.5 mEq/L — ABNORMAL LOW (ref 3.7–5.3)
Sodium: 141 mEq/L (ref 137–147)

## 2014-03-05 LAB — CULTURE, BLOOD (ROUTINE X 2)
CULTURE: NO GROWTH
Culture: NO GROWTH

## 2014-03-05 LAB — TSH: TSH: 5.69 u[IU]/mL — ABNORMAL HIGH (ref 0.350–4.500)

## 2014-03-05 MED ORDER — ATENOLOL 50 MG PO TABS
50.0000 mg | ORAL_TABLET | Freq: Every day | ORAL | Status: DC
  Administered 2014-03-05: 50 mg via ORAL
  Filled 2014-03-05 (×2): qty 1

## 2014-03-05 MED ORDER — FUROSEMIDE 40 MG PO TABS
40.0000 mg | ORAL_TABLET | Freq: Once | ORAL | Status: AC
  Administered 2014-03-05: 40 mg via ORAL
  Filled 2014-03-05: qty 1

## 2014-03-05 MED ORDER — ATORVASTATIN CALCIUM 20 MG PO TABS
20.0000 mg | ORAL_TABLET | Freq: Every day | ORAL | Status: DC
  Administered 2014-03-05 – 2014-03-06 (×2): 20 mg via ORAL
  Filled 2014-03-05 (×2): qty 1

## 2014-03-05 MED ORDER — NIACIN ER (ANTIHYPERLIPIDEMIC) 500 MG PO TBCR
500.0000 mg | EXTENDED_RELEASE_TABLET | Freq: Every day | ORAL | Status: DC
  Administered 2014-03-05: 500 mg via ORAL
  Filled 2014-03-05 (×2): qty 1

## 2014-03-05 NOTE — Evaluation (Signed)
Physical Therapy Evaluation Patient Details Name: Dustin Ashley MRN: [id number] DOB: October 16, 1940 Today's Date: 03/05/2014   History of Present Illness  is a 73 y.o. male with a recent cholecystectomy on 02/14/2014 who presents with complaints of severe upper ABD Pain radiating into both shoulders that started this am at around 5 AM.   He began to develop fevers at home by the afternoon to 101.5, his wife gave him tylenol. In the ED, a CTA of the Chest revealed LLL pneumonia and small Pulmonary Emboli.   Clinical Impression  Pt admitted with the above. Pt currently with functional limitations due to the deficits listed below (see PT Problem List). At the time of PT eval, pt was able to ambulate without an AD without LOB, however pt states he is not near his baseline of function at this time. Pt will benefit from skilled PT to increase their independence and safety with mobility to allow discharge to the venue listed below.       Follow Up Recommendations Home health PT;Supervision - Intermittent    Equipment Recommendations  None recommended by PT    Recommendations for Other Services       Precautions / Restrictions Precautions Precautions: Fall Restrictions Weight Bearing Restrictions: No      Mobility  Bed Mobility Overal bed mobility: Needs Assistance Bed Mobility: Supine to Sit;Sit to Supine     Supine to sit: Supervision Sit to supine: Supervision   General bed mobility comments: Supervision for safety as pt transitioned to full sitting position. No physical assist required. Pt able to scoot himself to [facility]   Transfers Overall transfer level: Needs assistance Equipment used: None Transfers: Sit to/from Stand Sit to Stand: Supervision         General transfer comment: Pt demonstrated proper hand placement and safety awarenress.   Ambulation/Gait Ambulation/Gait assistance: Min guard Ambulation Distance (Feet): 250 Feet Assistive device: None Gait  Pattern/deviations: Step-through pattern;Decreased stride length;Trunk flexed Gait velocity: Decreased Gait velocity interpretation: Below normal speed for age/gender General Gait Details: Pt with slow and guarded gait pattern. He was able to relax as gait training progressed, with no LOB or unsteadiness noted.   Stairs            Wheelchair Mobility    Modified Rankin (Stroke Patients Only)       Balance Overall balance assessment: Needs assistance Sitting-balance support: Feet supported;No upper extremity supported Sitting balance-Leahy Scale: Good     Standing balance support: No upper extremity supported Standing balance-Leahy Scale: Fair                               Pertinent Vitals/Pain Pain Assessment: 0-10 Pain Score: 2  Pain Location: Low back    Home Living Family/patient expects to be discharged to:: Private residence Living Arrangements: Spouse/significant other Available Help at Discharge: Family;Available 24 hours/day Type of Home: House Home Access: Stairs to enter Entrance Stairs-Rails: Right Entrance Stairs-Number of Steps: 4 Home Layout: Two level;Bed/bath upstairs Home Equipment: Walker - 2 wheels;Bedside commode      Prior Function Level of Independence: Independent         Comments: Still driving, retired, [employer] manager   Dominant Hand: Right    Extremity/Trunk Assessment   Upper Extremity Assessment: Defer to OT evaluation           Lower Extremity Assessment: Generalized weakness      Cervical /  Trunk Assessment: Normal  Communication   Communication: HOH  Cognition Arousal/Alertness: Awake/alert Behavior During Therapy: WFL for tasks assessed/performed Overall Cognitive Status: Within Functional Limits for tasks assessed                      General Comments      Exercises        Assessment/Plan    PT Assessment Patient needs continued PT services  PT  Diagnosis Difficulty walking;Generalized weakness   PT Problem List Decreased range of motion;Decreased strength;Decreased activity tolerance;Decreased balance;Decreased mobility;Decreased knowledge of use of DME;Decreased safety awareness  PT Treatment Interventions DME instruction;Gait training;Stair training;Functional mobility training;Therapeutic activities;Therapeutic exercise;Neuromuscular re-education;Patient/family education   PT Goals (Current goals can be found in the Care Plan section) Acute Rehab PT Goals Patient Stated Goal: home today PT Goal Formulation: With patient Time For Goal Achievement: 03/12/14 Potential to Achieve Goals: Good    Frequency Min 3X/week   Barriers to discharge        Co-evaluation               End of Session Equipment Utilized During Treatment: Gait belt Activity Tolerance: Patient tolerated treatment well Patient left: in bed;with call bell/phone within reach;with family/visitor present Nurse Communication: Mobility status         Time: 1554-1610 PT Time Calculation (min): 16 min   Charges:   PT Evaluation $Initial PT Evaluation Tier I: 1 Procedure PT Treatments $Therapeutic Activity: 8-22 mins   PT G CodesJolyn Lent 03/05/2014, 4:45 PM  Jolyn Lent, PT, DPT Acute Rehabilitation Services Pager: [phone]

## 2014-03-05 NOTE — Progress Notes (Addendum)
PROGRESS NOTE    WITTEN CERTAIN FUX:[id number] DOB: 02-Jun-1941 DOA: 02/26/2014 PCP: [name], MD  HPI/Brief narrative 73 year old male hospitalized 02/11/14-02/16/14 when he underwent laparoscopic cholecystectomy on 02/15/14 for chronic acalculous cholecystitis, returned on 02/26/14 with complaints of abdominal pain and fever. CT chest showed small PE and CT abdomen/pelvis showed small fluid collection in the gallbladder fossa. Due to concern for abdominal sepsis/peritonitis, patient was transferred to ICU under critical care service on 02/27/14. After improvement, patient was transferred back to the floor to [facility] on 03/03/14.     Assessment/Plan:  1. Abdominal pain (left upper abdominal pain): Unclear etiology-? Secondary to colonic ileus and esophagitis. s/p laparoscopic cholecystectomy 8/4. EGD 8/19 showed white exudates in the distal esophagus, small hiatal hernia and some retained gastric contents. Follow biopsy results. KUB 8/19 showed improving colonic ileus. GI signed off. Abdominal pain resolved on 8/21. Patient had multiple BMs in the last 24 hours. Discontinued narcotics and mobilize patient. Tolerating soft diet. As per previous note, patient had left-sided abdominal pain similar to preop state (prior to laparoscopic cholecystectomy). Surgeons evaluated on 8/16 - no surgical needs/signed off. Abdominal pain resolved. 2. Colonic ileus: Probably multifactorial secondary to opioids, decreased by mouth intake and immobility. Bowel regimen, minimize opioids and mobilize patient. Potassium and magnesium okay. Clinically resolved. Advance diet. 3. Acute pulmonary embolism/DVT left peroneal vein: Likely provoked in the setting of recent abdominal surgery. Patient was on heparin infusion but difficult to maintain therapeutic levels. Switched to full dose Lovenox until able to take consistently by mouth at which time start oral agent. Switched to Xarelto on 8/21 (obtained preauthorization: Case # for 15  mg tabs-[place] [id number] & case number for 20 mg tablets- [place] [id number]). 4. Possible healthcare associated pneumonia: Initially treated with Zosyn and then switched to oral levofloxacin to complete total 8 days treatment. Blood cultures negative to date. 5. Hypertension: Resume the atenolol at lower dose. Continue to hold amlodipine and ACE inhibitor. Titrate as needed. 6. 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 Patient has history of renal artery stenosis. 7. Anemia of critical illness: Stable. 8. Acute urinary retention: Foley was removed but had to be replaced secondary to ongoing retention. DC Foley 8/21 and monitor for voiding. Opioids discontinued. Voiding without difficulty. 9. History of CAD: Asymptomatic of chest pain. Home medications-atenolol, aspirin and benazepril on hold secondary to problem #1. Consider starting back on atenolol. 10. History of hyperlipidemia 11. Leukocytosis: Resolved 12. Afib: Noted A. fib on monitor between 8/17-8/19 and patient spontaneously reverted to sinus rhythm. No history of A. fib. Check 2-D echo and TSH. Patient on xarelto now for PE/DVT. Atenolol resumed.   Code Status: Full Family Communication: Discussed with spouse at bedside on 8/22. Disposition Plan: Home when medically stable.   Consultants:  Critical care medicine  Gen. surgery  Procedures:  Foley catheter-DC 8/21  Antibiotics:  IV vancomycin 8/15 - 8/16  IV Zosyn 8/15 >8/22   By mouth levofloxacin  Subjective: Having BMs. No abdominal pain. Tolerating diet. Leg and scrotal swelling. Voiding without difficulty.  Objective: Filed Vitals:   03/04/14 2001 03/05/14 0550 03/05/14 1000 03/05/14 1353  BP: 148/54 151/54 168/57 125/43  Pulse: 75 72 73 69  Temp: 98.5 F (36.9 C) 98.6 F (37 C)  99 F (37.2 C)  TempSrc: Oral Oral  Oral  Resp: 18 18 18 20   Height:      Weight:  93.35 kg (205 lb 12.8 oz)  SpO2: 95% 98% 95% 93%     Intake/Output Summary (Last 24 hours) at 03/05/14 1403 Last data filed at 03/05/14 1352  Gross per 24 hour  Intake    723 ml  Output   1675 ml  Net   -952 ml   Filed Weights   03/03/14 0454 03/04/14 0751 03/05/14 0550  Weight: 94.484 kg (208 lb 4.8 oz) 94.9 kg (209 lb 3.5 oz) 93.35 kg (205 lb 12.8 oz)     Exam:  General exam: Pleasant elderly male lying comfortably in bed.  Respiratory system: clear to auscultation. No increased work of breathing. Cardiovascular system: S1 & S2 heard, RRR. No JVD, murmurs, gallops, clicks. 1+ pitting bilateral ankle edema -decreasing. Telemetry: Sinus rhythm. Gastrointestinal system: Abdomen is nondistended, soft and nontender. Normal bowel sounds heard. Mild scrotal and penile edema Central nervous system: Alert and oriented. No focal neurological deficits. Extremities: Symmetric 5 x 5 power.   Data Reviewed: Basic Metabolic Panel:  Recent Labs Lab 02/28/14 0232 03/01/14 0450 03/02/14 0335 03/03/14 0446 03/05/14 0412  NA 137 140 138 138 141  K 4.4 4.2 4.2 4.0 3.5*  CL 104 108 104 103 103  CO2 21 19 21 23 26   GLUCOSE 121* 113* 118* 105* 112*  BUN 35* 22 16 14 11   CREATININE 2.05* 1.12 1.00 0.93 0.91  CALCIUM 8.2* 8.3* 8.5 8.6 8.4  MG  --   --   --  1.7  --    Liver Function Tests:  Recent Labs Lab 02/26/14 1510 02/28/14 0232 03/01/14 0450 03/02/14 0335  AST 26 20 29 23   ALT 31 28 31 27   ALKPHOS 119* 101 128* 145*  BILITOT 0.7 1.1 1.1 1.3*  PROT 6.7 5.8* 5.8* 5.8*  ALBUMIN 3.1* 2.3* 2.1* 2.0*    Recent Labs Lab 02/26/14 1510 03/01/14 1100  LIPASE 27 14  AMYLASE  --  16   No results found for this basename: AMMONIA,  in the last 168 hours CBC:  Recent Labs Lab 02/26/14 1510  02/27/14 1033 02/28/14 0232 03/01/14 0450 03/02/14 0335 03/03/14 0446  WBC 18.0*  < > 24.5* 18.2* 14.6* 11.0* 10.7*  NEUTROABS 15.5*  --   --   --  12.8*  --   --   HGB 14.0  < > 14.1 12.0* 12.3* 11.9* 11.8*  HCT 41.8  < > 42.9  36.8* 36.7* 35.8* 34.9*  MCV 84.4  < > 87.7 87.0 86.4 85.0 85.3  PLT 272  < > 285 243 253 314 317  < > = values in this interval not displayed. Cardiac Enzymes: No results found for this basename: CKTOTAL, CKMB, CKMBINDEX, TROPONINI,  in the last 168 hours BNP (last 3 results)  Recent Labs  02/27/14 1105  PROBNP 2847.0*   CBG:  Recent Labs Lab 02/27/14 0812 02/27/14 1141  GLUCAP 134* 122*    Recent Results (from the past 240 hour(s))  CULTURE, BLOOD (ROUTINE X 2)     Status: None   Collection Time    02/26/14  6:30 PM      Result Value Ref Range Status   Specimen Description BLOOD RIGHT ARM   Final   Special Requests BOTTLES DRAWN AEROBIC AND ANAEROBIC 10CC   Final   Culture  Setup Time     Final   Value: 02/27/2014 03:40     Performed at [facility]   Culture     Final   Value: NO GROWTH 5 DAYS     Performed at  [facility]   Report Status 03/05/2014 FINAL   Final  CULTURE, BLOOD (ROUTINE X 2)     Status: None   Collection Time    02/26/14  6:36 PM      Result Value Ref Range Status   Specimen Description BLOOD RIGHT HAND   Final   Special Requests BOTTLES DRAWN AEROBIC ONLY 10CC   Final   Culture  Setup Time     Final   Value: 02/27/2014 03:40     Performed at [facility]   Culture     Final   Value: NO GROWTH 5 DAYS     Performed at [facility]   Report Status 03/05/2014 FINAL   Final       Studies: No results found.      Scheduled Meds: . antiseptic oral rinse  7 mL Mouth Rinse q12n4p  . atenolol  50 mg Oral Daily  . atorvastatin  20 mg Oral Daily  . chlorhexidine  15 mL Mouth Rinse BID  . docusate sodium  100 mg Oral BID  . fluconazole  100 mg Oral Daily  . niacin  500 mg Oral QHS  . pantoprazole  40 mg Oral BID  . polyethylene glycol  17 g Oral BID  . rivaroxaban  15 mg Oral BID WC   Followed by  . [START ON 03/25/2014] rivaroxaban  20 mg Oral Q supper  . sodium chloride  3 mL Intravenous Q12H    Continuous Infusions:    Principal Problem:   Pulmonary embolism Active Problems:   HYPERLIPIDEMIA   HYPERTENSION   CORONARY ARTERY DISEASE   HYPERGLYCEMIA   Chronic renal insufficiency, stage II (mild)   HCAP (healthcare-associated pneumonia)   Leukocytosis   SIRS (systemic inflammatory response syndrome)   Abdominal pain, other specified site   Nonspecific (abnormal) findings on radiological and other examination of gastrointestinal tract    Time spent: 30 minutes.    Vernell Leep, MD, FACP, FHM. [facility] Pager [phone]  If 7PM-7AM, please contact night-coverage [email or web address] Password [email or web address] 03/05/2014, 2:03 PM    LOS: 7 days

## 2014-03-06 DIAGNOSIS — I369 Nonrheumatic tricuspid valve disorder, unspecified: Secondary | ICD-10-CM

## 2014-03-06 LAB — BASIC METABOLIC PANEL
ANION GAP: 11 (ref 5–15)
BUN: 11 mg/dL (ref 6–23)
CHLORIDE: 102 meq/L (ref 96–112)
CO2: 29 meq/L (ref 19–32)
Calcium: 8.4 mg/dL (ref 8.4–10.5)
Creatinine, Ser: 0.98 mg/dL (ref 0.50–1.35)
GFR calc Af Amer: >90 mL/min (ref >90)
GFR calc non Af Amer: 80 mL/min — ABNORMAL LOW (ref >90)
Glucose, Bld: 112 mg/dL — ABNORMAL HIGH (ref 70–99)
Potassium: 3.4 mEq/L — ABNORMAL LOW (ref 3.7–5.3)
Sodium: 142 mEq/L (ref 137–147)

## 2014-03-06 LAB — MAGNESIUM: Magnesium: 1.7 mg/dL (ref 1.5–2.5)

## 2014-03-06 MED ORDER — ESOMEPRAZOLE MAGNESIUM 40 MG PO CPDR: 40.0000 mg | DELAYED_RELEASE_CAPSULE | Freq: Every day | ORAL | Status: DC

## 2014-03-06 MED ORDER — RIVAROXABAN 20 MG PO TABS: 20.0000 mg | ORAL_TABLET | Freq: Every day | ORAL | Status: DC

## 2014-03-06 MED ORDER — BISACODYL 5 MG PO TBEC: 5.0000 mg | DELAYED_RELEASE_TABLET | Freq: Every day | ORAL | Status: DC | PRN

## 2014-03-06 MED ORDER — ACETAMINOPHEN 325 MG PO TABS: 650.0000 mg | ORAL_TABLET | Freq: Four times a day (QID) | ORAL | Status: DC | PRN

## 2014-03-06 MED ORDER — RIVAROXABAN 15 MG PO TABS
15.0000 mg | ORAL_TABLET | Freq: Two times a day (BID) | ORAL | Status: DC
Start: 2014-03-06 — End: 2014-03-06

## 2014-03-06 MED ORDER — POTASSIUM CHLORIDE ER 10 MEQ PO TBCR: 10.0000 meq | EXTENDED_RELEASE_TABLET | Freq: Every day | ORAL | Status: DC

## 2014-03-06 MED ORDER — FLUCONAZOLE 100 MG PO TABS: 100.0000 mg | ORAL_TABLET | Freq: Every day | ORAL | Status: DC

## 2014-03-06 MED ORDER — AMLODIPINE BESYLATE 10 MG PO TABS
10.0000 mg | ORAL_TABLET | Freq: Every day | ORAL | Status: DC
  Administered 2014-03-06: 10 mg via ORAL
  Filled 2014-03-06: qty 1

## 2014-03-06 MED ORDER — POTASSIUM CHLORIDE CRYS ER 20 MEQ PO TBCR
40.0000 meq | EXTENDED_RELEASE_TABLET | Freq: Once | ORAL | Status: AC
  Administered 2014-03-06: 40 meq via ORAL
  Filled 2014-03-06: qty 2

## 2014-03-06 MED ORDER — RIVAROXABAN 15 MG PO TABS: 15.0000 mg | ORAL_TABLET | Freq: Two times a day (BID) | ORAL | Status: DC

## 2014-03-06 MED ORDER — FUROSEMIDE 20 MG PO TABS: 20.0000 mg | ORAL_TABLET | Freq: Every day | ORAL | Status: DC

## 2014-03-06 MED ORDER — DSS 100 MG PO CAPS: 100.0000 mg | ORAL_CAPSULE | Freq: Two times a day (BID) | ORAL | Status: DC

## 2014-03-06 MED ORDER — POLYETHYLENE GLYCOL 3350 17 G PO PACK: 17.0000 g | PACK | Freq: Two times a day (BID) | ORAL | Status: DC

## 2014-03-06 MED ORDER — ATENOLOL 50 MG PO TABS
75.0000 mg | ORAL_TABLET | Freq: Every day | ORAL | Status: DC
  Administered 2014-03-06: 75 mg via ORAL
  Filled 2014-03-06: qty 1

## 2014-03-06 NOTE — Discharge Summary (Addendum)
Physician Discharge Summary  Dustin Ashley DGL:[id number] DOB: 04/19/1941 DOA: 02/26/2014  PCP: [name], MD  Admit date: 02/26/2014 Discharge date: 03/06/2014  Time spent: Greater than 30 minutes  Recommendations for Outpatient Follow-up:  1. Dr. [name], PCP in 3 days with repeat labs (CBC & BMP). 2. Dr. Sherren Mocha, Cardiology in 1 week- FU of newly diagnosed PAF. 3. Dr. Silvano Rusk, GI - as needed. 4. Recommend followup chest x-ray in 4-6 weeks to ensure resolution of pneumonia findings. 5. Recommend followup of TSH in 4-6 weeks. 6. Home health PT.  Discharge Diagnoses:  Principal Problem:   Pulmonary embolism Active Problems:   HYPERLIPIDEMIA   HYPERTENSION   CORONARY ARTERY DISEASE   HYPERGLYCEMIA   Chronic renal insufficiency, stage II (mild)   HCAP (healthcare-associated pneumonia)   Leukocytosis   SIRS (systemic inflammatory response syndrome)   Abdominal pain, other specified site   Nonspecific (abnormal) findings on radiological and other examination of gastrointestinal tract   Discharge Condition: Improved & Stable  Diet recommendation: Heart Healthy diet.  Filed Weights   03/04/14 0751 03/05/14 0550 03/06/14 0515  Weight: 94.9 kg (209 lb 3.5 oz) 93.35 kg (205 lb 12.8 oz) 91.264 kg (201 lb 3.2 oz)    History of present illness:  73 year old male hospitalized 02/11/14-02/16/14 when he underwent laparoscopic cholecystectomy on 02/15/14 for chronic acalculous cholecystitis, returned on 02/26/14 with complaints of abdominal pain and fever. CT chest showed small PE and CT abdomen/pelvis showed small fluid collection in the gallbladder fossa. Due to concern for abdominal sepsis/peritonitis, patient was transferred to ICU under critical care service on 02/27/14. After improvement, patient was transferred back to the floor to [facility] on 03/03/14.  Hospital Course:   1. Abdominal pain (left upper abdominal pain): Unclear etiology-? Secondary to colonic ileus and esophagitis.  s/p laparoscopic cholecystectomy 8/4. EGD 8/19 showed white exudates in the distal esophagus c/w Candida esophagitis, small hiatal hernia and some retained gastric contents. KUB 8/19 showed improving colonic ileus. Patient's narcotics were discontinued. He continued to mobilize. Bowel regimen was initiated. Patient has had BMs daily. Abdominal pain has resolved and he is tolerating diet. GI signed off. As per previous note, patient had left-sided abdominal pain similar to preop state (prior to laparoscopic cholecystectomy). Surgeons evaluated on 8/16 - no surgical needs/signed off.  2. Colonic ileus: Probably multifactorial secondary to opioids, decreased by mouth intake and immobility. Bowel regimen, minimize opioids and mobilize patient. Resolved. Continue bowel regimen. 3. Acute pulmonary embolism/DVT left peroneal vein: Likely provoked in the setting of recent abdominal surgery. Patient was on heparin infusion but difficult to maintain therapeutic levels. Switched to full dose Lovenox until able to take consistently by mouth. Switched to Xarelto on 8/21 (obtained preauthorization: Case # for 15 mg tabs-[place] [id number] & case number for 20 mg tablets- [place] [id number]). He will probably need to remain on Xarelto for PAF. 4. Possible healthcare associated pneumonia: Initially treated with Zosyn and then switched to oral levofloxacin and completed 8 days treatment. Blood cultures negative. 5. Hypertension: Resumed atenolol and amlodipine. ACE inhibitors held secondary to recent acute renal failure but can be resumed during outpatient followup pending review of BMP. 6. 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 Patient has history of renal artery stenosis. 7. Anemia of critical illness: Stable. 8. Acute urinary retention: Foley was removed but had to be replaced secondary to ongoing retention. DC Foley 8/21 and monitor for voiding. Opioids discontinued. Voiding without  difficulty. Patient  has mild penoscrotal edema. He and family have been counseled regarding pulling prepuce over glans daily to avoid paraphimosis & they verbalized understanding. 9. History of CAD: Asymptomatic of chest pain. 10. History of hyperlipidemia: Continue statins 11. Leukocytosis: Resolved 12. PAF: Noted asymptomatic A. fib on monitor between 8/17-8/19 and patient spontaneously reverted to sinus rhythm. No history of A. fib or palpitations. Patient had recurrence of A. Fib with CVR on 8/22 and reverted spontaneously to sinus rhythm. Continue atenolol. Patient on xarelto now for PE/DVT which may need to be continued for PAF. CHADS 2 score: 2.  13. High TSH: Clinically euthyroid.? Secondary to acute illness. Recommend following TSH in 4-6 weeks. 14. Fluid overload/possible acute diastolic CHF: Likely complicated by to IVF resuscitation and hypoalbuminemia from poor nutritional status. ProBNP 2847. Low dose Lasix initiated, diuresing and improving. Follow closely as outpatient. 15. Hypokalemia: Replaced prior to DC. EKG shows mild prolonged QTC 493 ms-follow outpatient. Potassium 1.7. 16. Chronic benzodiazepine use: Patient states that he uses chronic benzodiazepine at least nightly at bedtime for sleep and back spasms. Advised patient and family not to abruptly stop benzodiazepine and they verbalized understanding. 17. Candida esophagitis: Complete one week of fluconazole.   Consultants:  Critical care medicine  Gen. surgery  Procedures:  Foley catheter-DC 8/21    Discharge Exam:  Complaints:  Patient denies dyspnea, chest pain or palpitations. Voiding well and decreasing lower extremity and penoscrotal swelling. No bleeding reported. Having BMs. Tolerating diet. No abdominal pain, nausea vomiting. Family insisting on discharge.  Filed Vitals:   03/05/14 1517 03/05/14 1657 03/05/14 2045 03/06/14 0515  BP: 151/79  130/58 149/55  Pulse: 132 81 112 65  Temp:   98.8 F (37.1 C)  98.7 F (37.1 C)  TempSrc:   Oral Oral  Resp:   18 18  Height:      Weight:    91.264 kg (201 lb 3.2 oz)  SpO2: 93%  95% 98%   General exam: Pleasant elderly male lying comfortably in bed.  Respiratory system: clear to auscultation. No increased work of breathing.  Cardiovascular system: S1 & S2 heard, RRR. No JVD, murmurs, gallops, clicks. 1+ pitting bilateral ankle edema -decreasing. Telemetry: A. fib with ventricular rate in the 90s-100s seen on 03/05/14 between 3:05 PM-10:03 PM-reverted to sinus rhythm. Currently sinus rhythm in the 60s.  Gastrointestinal system: Abdomen is nondistended, soft and nontender. Normal bowel sounds heard. Mild scrotal and penile edema-decreasing.  Central nervous system: Alert and oriented. No focal neurological deficits.  Extremities: Symmetric 5 x 5 power.   Discharge Instructions      Discharge Instructions   Call MD for:  persistant nausea and vomiting    Complete by:  As directed      Call MD for:  severe uncontrolled pain    Complete by:  As directed      Diet - low sodium heart healthy    Complete by:  As directed      Increase activity slowly    Complete by:  As directed             Medication List    STOP taking these medications       aspirin 81 MG tablet     benazepril 20 MG tablet  Commonly known as:  LOTENSIN     ketorolac 10 MG tablet  Commonly known as:  TORADOL      TAKE these medications       acetaminophen 325 MG tablet  Commonly known  as:  TYLENOL  Take 2 tablets (650 mg total) by mouth every 6 (six) hours as needed for mild pain, moderate pain, fever or headache.     amLODipine 10 MG tablet  Commonly known as:  NORVASC  Take 10 mg by mouth daily.     atenolol 50 MG tablet  Commonly known as:  TENORMIN  Take 75 mg by mouth daily.     atorvastatin 20 MG tablet  Commonly known as:  LIPITOR  Take 20 mg by mouth daily.     bisacodyl 5 MG EC tablet  Commonly known as:  DULCOLAX  Take 1 tablet (5 mg total)  by mouth daily as needed for moderate constipation.     diazepam 10 MG tablet  Commonly known as:  VALIUM  Take 10 mg by mouth every 12 (twelve) hours as needed for anxiety.     DSS 100 MG Caps  Take 100 mg by mouth 2 (two) times daily.     esomeprazole 40 MG capsule  Commonly known as:  NEXIUM  Take 1 capsule (40 mg total) by mouth daily.     fexofenadine 180 MG tablet  Commonly known as:  ALLEGRA  Take 180 mg by mouth daily.     fluconazole 100 MG tablet  Commonly known as:  DIFLUCAN  Take 1 tablet (100 mg total) by mouth daily.     furosemide 20 MG tablet  Commonly known as:  LASIX  Take 1 tablet (20 mg total) by mouth daily.     niacin 500 MG CR tablet  Commonly known as:  NIASPAN  Take 500 mg by mouth at bedtime.     polyethylene glycol packet  Commonly known as:  MIRALAX / GLYCOLAX  Take 17 g by mouth 2 (two) times daily.     potassium chloride 10 MEQ tablet  Commonly known as:  K-DUR  Take 1 tablet (10 mEq total) by mouth daily.     Rivaroxaban 15 MG Tabs tablet  Commonly known as:  XARELTO  Take 1 tablet (15 mg total) by mouth 2 (two) times daily with a meal.     rivaroxaban 20 MG Tabs tablet  Commonly known as:  XARELTO  Take 1 tablet (20 mg total) by mouth daily with supper. First dose on Fri 03/25/14 at 1700  Start taking on:  03/25/2014       Follow-up Information   Follow up with [name], MD. Schedule an appointment as soon as possible for a visit in 3 days. (To be seen with repeat labs (BMP & CBC). )    Specialty:  Internal Medicine   Contact information:   [phone] [facility] [address] [place] [address] [phone]       Follow up with Sherren Mocha, MD. Schedule an appointment as soon as possible for a visit in 1 week.   Specialty:  Cardiology   Contact information:   [address] [place] [address] [phone]        The results of significant diagnostics from this hospitalization (including  imaging, microbiology, ancillary and laboratory) are listed below for reference.    Significant Diagnostic Studies: Dg Chest 2 View  02/26/2014   CLINICAL DATA:  Fever.  EXAM: CHEST  2 VIEW  COMPARISON:  None.  FINDINGS: Lung volumes are low. There is some basilar atelectasis. No consolidative process, pneumothorax or effusion is identified. Heart size is normal. The patient is status post CABG.  IMPRESSION: No  acute disease in a low volume chest.   Electronically Signed   By: Inge Rise M.D.   On: 02/26/2014 16:46   Dg Cholangiogram Operative  02/15/2014   CLINICAL DATA:  Abnormal biliary ejection fraction  EXAM: INTRAOPERATIVE CHOLANGIOGRAM  TECHNIQUE: Cholangiographic images from the C-arm fluoroscopic device were submitted for interpretation post-operatively. Please see the procedural report for the amount of contrast and the fluoroscopy time utilized.  COMPARISON:  Nuclear medicine HIDA scan 02/12/2014; prior abdominal ultrasound 02/12/2014  FINDINGS: Intraoperative staining and spot images demonstrate cannulation of the cystic duct remnant and opacification of the intra and extrahepatic biliary tree. No evidence of stenosis, stricture, filling defect or dilatation. Contrast material passes freely through the ampulla and into the duodenum. A nasogastric tube is present.  IMPRESSION: Negative intraoperative cholangiogram.   Electronically Signed   By: Jacqulynn Cadet M.D.   On: 02/15/2014 11:24   Ct Angio Chest Pe W/cm &/or Wo Cm  02/26/2014   CLINICAL DATA:  Upper abdominal pain, back pain, shoulder pain. Recent gallbladder surgery. Chest pain. Concern for pulmonary embolism  EXAM: CT ANGIOGRAPHY CHEST WITH CONTRAST  TECHNIQUE: Multidetector CT imaging of the chest was performed using the standard protocol during bolus administration of intravenous contrast. Multiplanar CT image reconstructions and MIPs were obtained to evaluate the vascular anatomy.  CONTRAST:  168mL OMNIPAQUE IOHEXOL 300  MG/ML  SOLN  COMPARISON:  None.  FINDINGS: There is a than tubular filling defect within the central aspect of the left lower lobe pulmonary artery (image 71, series 601). Second wall adherent defect within an anterior left lower lobe pulmonary artery (image 63). No upper lobe pulmonary emboli or right lung pulmonary emboli.  No acute findings of the aorta great vessels. No pericardial fluid. Coronary calcifications noted. Patient status post bypass graft surgery.  Review of the lung windows demonstrate bibasilar atelectasis and mild interstitial edema. There is mild airspace disease in the left lower lobe.  Review of the MIP images confirms the above findings.  IMPRESSION: 1. Small pulmonary emboli within a subsegmental left lower lobe pulmonary arteries. Overall clot burden is minimal. 2. Bibasilar atelectasis and concern for airspace disease at left lung base. This could represent pneumonia or aspiration pneumonitis. Small pulmonary infarction would also be in differential. Findings conveyed toVRINDA PICKERING on 02/26/2014  at18:39.   Electronically Signed   By: Suzy Bouchard M.D.   On: 02/26/2014 18:39   US Abdomen Complete  02/12/2014   CLINICAL DATA:  Abdominal pain.  EXAM: ULTRASOUND ABDOMEN COMPLETE  COMPARISON:  CT abdomen and pelvis 02/11/2014 and abdominal ultrasound 11/05/2010  FINDINGS: Gallbladder:  No gallstones or wall thickening visualized. No sonographic Murphy sign noted.  Common bile duct:  Diameter: 4 mm  Liver:  Suboptimal visualization of the left lobe. No focal abnormality identified.  IVC:  No abnormality visualized.  Pancreas:  Not visualized due to overlying bowel gas.  Spleen:  Size and appearance within normal limits.  Right Kidney:  Length: 11.2 cm. Echogenicity within normal limits. No mass or hydronephrosis visualized.  Left Kidney:  Length: 11.8 cm. Echogenicity within normal limits. No mass or hydronephrosis visualized.  Abdominal aorta:  No aneurysm visualized.  Other  findings:  None.  IMPRESSION: Limited visualization of midline structures due to bowel gas. No acute abnormality identified.   Electronically Signed   By: Logan Bores   On: 02/12/2014 08:56   Ct Abdomen Pelvis W Contrast  02/26/2014   CLINICAL DATA:  Upper abdominal pain, pain to back  of shoulders, fell hot, diaphoretic, and "funny"; patient is post cholecystectomy 1 week ago  EXAM: CT ABDOMEN AND PELVIS WITH CONTRAST  TECHNIQUE: Multidetector CT imaging of the abdomen and pelvis was performed using the standard protocol following bolus administration of intravenous contrast. Sagittal and coronal MPR images reconstructed from axial data set. CTA chest reported separately.  CONTRAST:  181mL OMNIPAQUE IOHEXOL 300 MG/ML  SOLN IV  COMPARISON:  02/11/2014  FINDINGS: Bibasilar atelectasis.  Post median sternotomy and cholecystectomy.  Liver, spleen, pancreas, kidneys, and adrenal glands normal appearance.  Small postoperative fluid collection at gallbladder fossa, 3.2 x 2.1 cm image 19 containing no gas.  Small amount of free pelvic fluid dependently.  Normal appendix.  Unremarkable bladder and ureters.  Small hiatal hernia with stomach and small bowel loops otherwise unremarkable.  No mass, adenopathy, free air or acute bone lesion.  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  IMPRESSION: Small postoperative fluid collection at gallbladder fossa post cholecystectomy measuring 3.2 x 2.1 cm, nonspecific, could be sterile or infected.  Small amount of nonspecific free pelvic fluid.  No other intra-abdominal or intrapelvic abnormalities.   Electronically Signed   By: Lavonia Dana M.D.   On: 02/26/2014 18:32   Ct Abdomen Pelvis W Contrast  02/11/2014   CLINICAL DATA:  Upper abdominal pain since Wednesday. Nausea and vomiting.  EXAM: CT ABDOMEN AND PELVIS WITH CONTRAST  TECHNIQUE: Multidetector CT imaging of the abdomen and pelvis was performed using the standard protocol following bolus  administration of intravenous contrast.  CONTRAST:  123mL OMNIPAQUE IOHEXOL 300 MG/ML  SOLN  COMPARISON:  Ultrasound kidneys 11/05/2010  FINDINGS: Dependent changes in the lung bases. Small esophageal hiatal hernia. Postoperative changes in the mediastinum.  But there is evidence of minimal infiltration in the fat around the head of the pancreas which, given the patient's symptoms could indicate early changes of acute focal pancreatitis. No loculated fluid collections are demonstrated in the pancreatic parenchymal enhancement is normal. Correlation with laboratory studies is recommended.  The liver, spleen, gallbladder, adrenal glands, kidneys, inferior vena cava, and retroperitoneal lymph nodes are unremarkable. Calcification of the abdominal aorta without aneurysm or dissection. Stomach, small bowel, and colon are normal for degree of distention. Scattered stool in the colon. No free air or free fluid in the abdomen. Small amount of fat in the umbilicus.  Pelvis: Appendix is normal. Scattered diverticula in the sigmoid colon without evidence of diverticulitis. Prostate gland is not enlarged. Bladder wall is not thickened. No free or loculated pelvic fluid collections. No pelvic mass or lymphadenopathy. 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 Degenerative changes in the lumbar spine. No destructive bone lesions appreciated.  IMPRESSION: Mild infiltration suggested around the head of the pancreas, possibly indicating early acute pancreatitis. Small esophageal hiatal hernia. 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   Electronically Signed   By: Lucienne Capers M.D.   On: 02/11/2014 22:30   Nm Hepato W/eject Fract  02/12/2014   CLINICAL DATA:  Abdominal pain  EXAM: NUCLEAR MEDICINE HEPATOBILIARY IMAGING WITH GALLBLADDER EF  TECHNIQUE: Sequential images of the abdomen were obtained out to 60 minutes following intravenous administration of radiopharmaceutical. After slow intravenous  infusion of Cholecystokinin, gallbladder ejection fraction was determined.  RADIOPHARMACEUTICALS:  [employer] [place] YQ-82N Choletec  COMPARISON:  None.  FINDINGS: Adequate uptake is noted throughout the liver following injection. The biliary tree is noted at 10 min with gallbladder visualization at 15 min. Progressive filling of the gallbladder is noted.  Following  injection with CCK the gallbladder ejection fraction is calculated at 6% which is well below the expected normal of greater than 30%.  The patient did experience symptoms during CCK infusion.  IMPRESSION: Normal uptake and excretion of biliary tracer.  Severely reduced ejection fraction at 6%.   Electronically Signed   By: Inez Catalina M.D.   On: 02/12/2014 14:41   Dg Chest Port 1 View  03/01/2014   CLINICAL DATA:  Shortness of breath, atelectasis, history coronary artery disease, GERD, hypertension, mild chronic renal insufficiency stage II  EXAM: PORTABLE CHEST - 1 VIEW  COMPARISON:  Portable exam 0526 hr compared to 02/28/2014  FINDINGS: Enlargement of cardiac silhouette post CABG.  Slight pulmonary vascular congestion.  Bibasilar atelectasis and question small LEFT pleural effusion.  Accentuation of perihilar markings versus previous study question minimal edema.  No pneumothorax.  IMPRESSION: Enlargement of cardiac silhouette with vascular congestion post CABG.  Bibasilar atelectasis, small LEFT pleural effusion and question new minimal perihilar edema.   Electronically Signed   By: Lavonia Dana M.D.   On: 03/01/2014 08:06   Dg Chest Port 1 View  02/28/2014   CLINICAL DATA:  Pneumonia.  EXAM: PORTABLE CHEST - 1 VIEW  COMPARISON:  CT chest and chest radiograph 02/26/2014.  FINDINGS: Trachea is midline. Heart is at the upper limits of normal in size. Lungs are low in volume with bibasilar atelectasis and probable small left pleural effusion.  IMPRESSION: Low lung volumes with bibasilar atelectasis and probable small left pleural effusion.    Electronically Signed   By: Lorin Picket M.D.   On: 02/28/2014 07:07   Dg Abd 2 Views  03/02/2014   CLINICAL DATA:  Abdominal pain.  Evaluate for ileus.  EXAM: ABDOMEN - 2 VIEW  COMPARISON:  03/01/2014; CT abdomen pelvis - 02/26/2014  FINDINGS: There has been interval passage of some of the previously ingested enteric contrast with minimal amount of residual contrast seen within predominantly the splenic flexure of the colon. There is persistent mild gas distention of the colon without upstream dilatation of the small bowel. No pneumoperitoneum, pneumatosis or portal venous gas.  Multiple phleboliths and vascular calcifications overlie the lower pelvis bilaterally. No definite abnormal intra-abdominal calcifications. No acute osseus abnormalities. Post median sternotomy.  IMPRESSION: Suspected improving colonic ileus.   Electronically Signed   By: Sandi Mariscal M.D.   On: 03/02/2014 13:36   Dg Abd Portable 1v  03/01/2014   CLINICAL DATA:  Abdominal pain and discomfort, question bowel obstruction, no recent bowel movement, history hypertension, GERD  EXAM: PORTABLE ABDOMEN - 1 VIEW  COMPARISON:  CT abdomen and pelvis 02/26/2014  FINDINGS: Contrast in RIGHT colon.  Gas throughout remainder of colon.  Minimal gas in rectum.  No small bowel dilatation, evidence of bowel wall thickening or bowel obstruction.  Atelectasis versus consolidation LEFT lower lobe with minimal RIGHT basilar atelectasis.  Slight gaseous distention of stomach.  Bones demineralized.  No definite urinary tract calcification.  IMPRESSION: Nonobstructive bowel gas pattern.  Atelectasis versus consolidation LEFT lower lobe.   Electronically Signed   By: Lavonia Dana M.D.   On: 03/01/2014 10:15    Microbiology: Recent Results (from the past 240 hour(s))  CULTURE, BLOOD (ROUTINE X 2)     Status: None   Collection Time    02/26/14  6:30 PM      Result Value Ref Range Status   Specimen Description BLOOD RIGHT ARM   Final   Special Requests  BOTTLES DRAWN AEROBIC AND  ANAEROBIC 10CC   Final   Culture  Setup Time     Final   Value: 02/27/2014 03:40     Performed at [facility]   Culture     Final   Value: NO GROWTH 5 DAYS     Performed at [facility]   Report Status 03/05/2014 FINAL   Final  CULTURE, BLOOD (ROUTINE X 2)     Status: None   Collection Time    02/26/14  6:36 PM      Result Value Ref Range Status   Specimen Description BLOOD RIGHT HAND   Final   Special Requests BOTTLES DRAWN AEROBIC ONLY 10CC   Final   Culture  Setup Time     Final   Value: 02/27/2014 03:40     Performed at [facility]   Culture     Final   Value: NO GROWTH 5 DAYS     Performed at [facility]   Report Status 03/05/2014 FINAL   Final     Labs: Basic Metabolic Panel:  Recent Labs Lab 03/01/14 0450 03/02/14 0335 03/03/14 0446 03/05/14 0412 03/06/14 0415  NA 140 138 138 141 142  K 4.2 4.2 4.0 3.5* 3.4*  CL 108 104 103 103 102  CO2 19 21 23 26 29   GLUCOSE 113* 118* 105* 112* 112*  BUN 22 16 14 11 11   CREATININE 1.12 1.00 0.93 0.91 0.98  CALCIUM 8.3* 8.5 8.6 8.4 8.4  MG  --   --  1.7  --  1.7   Liver Function Tests:  Recent Labs Lab 02/28/14 0232 03/01/14 0450 03/02/14 0335  AST 20 29 23   ALT 28 31 27   ALKPHOS 101 128* 145*  BILITOT 1.1 1.1 1.3*  PROT 5.8* 5.8* 5.8*  ALBUMIN 2.3* 2.1* 2.0*    Recent Labs Lab 03/01/14 1100  LIPASE 14  AMYLASE 16   No results found for this basename: AMMONIA,  in the last 168 hours CBC:  Recent Labs Lab 02/28/14 0232 03/01/14 0450 03/02/14 0335 03/03/14 0446  WBC 18.2* 14.6* 11.0* 10.7*  NEUTROABS  --  12.8*  --   --   HGB 12.0* 12.3* 11.9* 11.8*  HCT 36.8* 36.7* 35.8* 34.9*  MCV 87.0 86.4 85.0 85.3  PLT 243 253 314 317   Cardiac Enzymes: No results found for this basename: CKTOTAL, CKMB, CKMBINDEX, TROPONINI,  in the last 168 hours BNP: BNP (last 3 results)  Recent Labs  02/27/14 1105  PROBNP 2847.0*   CBG: No results found  for this basename: GLUCAP,  in the last 168 hours  Additional labs: 1. TSH: 5.690 2. 2-D echo 03/06/14: Study Conclusions  - Left ventricle: Abnormal septal motion. The cavity size was normal. Wall thickness was normal. The estimated ejection fraction was 55%. - Left atrium: The atrium was mildly dilated. - Atrial septum: No defect or patent foramen ovale was identified. 3. Bilateral lower extremity venous Dopplers 03/01/14: Summary:  - No evidence of deep vein thrombosis involving the right lower extremity. - Findings consistent with deep vein thrombosis involving a single left peroneal vein in the mid calf. 4. Pathology: Diagnosis Esophagus, biopsy, distal - CANDIDA ESOPHAGITIS. - THERE IS NO EVIDENCE OF DYSPLASIA OR MALIGNANCY. - SEE COMMENT.      Signed:  Vernell Leep, MD, FACP, FHM. [facility] Pager [phone]  If 7PM-7AM, please contact night-coverage [email or web address] Password [facility] 03/06/2014, 12:40 PM

## 2014-03-06 NOTE — Progress Notes (Signed)
Client was discharged into spousal care.  Spouse verbalized understanding of discharge instructions.  Son had earlier expressed anger toward hospital related to  waiting for results of ECHO for father, even after the possible complications were explained by the Physician.  Son is loud and displays suspicious behavior toward staff.  Son verbalized that we are keeping his father to make more money.  After ECHO was reviewed, client was released.  Social worker assisted with getting Xarelto free for client for the next 30 days.  New prescription was written for the 15 mg tablet.  MD will cancel the Xarelto prescription for 15 mg called to personal pharmacy.   Client also instruction to pulled skin over penis to afford urinary issues by MD, instructions reinforced.

## 2014-03-06 NOTE — Progress Notes (Signed)
  Echocardiogram 2D Echocardiogram has been performed.  Dustin Ashley 03/06/2014, 10:16 AM

## 2014-03-07 ENCOUNTER — Telehealth: Payer: Self-pay | Admitting: Internal Medicine

## 2014-03-07 NOTE — Telephone Encounter (Signed)
Please call the patient, needs a hospital  followup this week

## 2014-03-10 ENCOUNTER — Ambulatory Visit (INDEPENDENT_AMBULATORY_CARE_PROVIDER_SITE_OTHER): Payer: Medicare Other | Admitting: Internal Medicine

## 2014-03-10 ENCOUNTER — Encounter: Payer: Self-pay | Admitting: Internal Medicine

## 2014-03-10 VITALS — BP 138/68 | HR 62 | Temp 98.8°F | Wt 191.4 lb

## 2014-03-10 DIAGNOSIS — I10 Essential (primary) hypertension: Secondary | ICD-10-CM

## 2014-03-10 DIAGNOSIS — I2699 Other pulmonary embolism without acute cor pulmonale: Secondary | ICD-10-CM

## 2014-03-10 DIAGNOSIS — J189 Pneumonia, unspecified organism: Secondary | ICD-10-CM

## 2014-03-10 DIAGNOSIS — I480 Paroxysmal atrial fibrillation: Secondary | ICD-10-CM

## 2014-03-10 DIAGNOSIS — R109 Unspecified abdominal pain: Secondary | ICD-10-CM

## 2014-03-10 DIAGNOSIS — I4891 Unspecified atrial fibrillation: Secondary | ICD-10-CM

## 2014-03-10 DIAGNOSIS — M549 Dorsalgia, unspecified: Secondary | ICD-10-CM

## 2014-03-10 LAB — CBC WITH DIFFERENTIAL/PLATELET
BASOS PCT: 0.6 % (ref 0.0–3.0)
Basophils Absolute: 0.1 10*3/uL (ref 0.0–0.1)
EOS ABS: 0.3 10*3/uL (ref 0.0–0.7)
EOS PCT: 2.5 % (ref 0.0–5.0)
HCT: 41.6 % (ref 39.0–52.0)
HEMOGLOBIN: 13.7 g/dL (ref 13.0–17.0)
Lymphocytes Relative: 17.8 % (ref 12.0–46.0)
Lymphs Abs: 1.8 10*3/uL (ref 0.7–4.0)
MCHC: 32.9 g/dL (ref 30.0–36.0)
MCV: 86.9 fl (ref 78.0–100.0)
MONO ABS: 0.7 10*3/uL (ref 0.1–1.0)
Monocytes Relative: 6.9 % (ref 3.0–12.0)
NEUTROS ABS: 7.4 10*3/uL (ref 1.4–7.7)
Neutrophils Relative %: 72.2 % (ref 43.0–77.0)
Platelets: 294 10*3/uL (ref 150.0–400.0)
RBC: 4.78 Mil/uL (ref 4.22–5.81)
RDW: 14.4 % (ref 11.5–15.5)
WBC: 10.2 10*3/uL (ref 4.0–10.5)

## 2014-03-10 LAB — BASIC METABOLIC PANEL
BUN: 11 mg/dL (ref 6–23)
CHLORIDE: 101 meq/L (ref 96–112)
CO2: 31 meq/L (ref 19–32)
CREATININE: 1 mg/dL (ref 0.4–1.5)
Calcium: 9.1 mg/dL (ref 8.4–10.5)
GFR: 77.77 mL/min (ref >60.00)
Glucose, Bld: 85 mg/dL (ref 70–99)
POTASSIUM: 3.8 meq/L (ref 3.5–5.1)
SODIUM: 138 meq/L (ref 135–145)

## 2014-03-10 NOTE — Progress Notes (Signed)
Subjective:    Patient ID: Dustin Ashley, male    DOB: January 30, 1941, 73 y.o.   MRN: [id number]  DOS:  03/10/2014 Type of visit - description:  Hospital f/u chart reviewed, excerpts:   .Admit date: 02/26/2014  Discharge date: 03/06/2014  Time spent: Greater than 30 minutes  Recommendations for Outpatient Follow-up:  1. Dr. [name], PCP in 3 days with repeat labs (CBC & BMP). 2. Dr. Sherren Mocha, Cardiology in 1 week- FU of newly diagnosed PAF. 3. Dr. Silvano Rusk, GI - as needed. 4. Recommend followup chest x-ray in 4-6 weeks to ensure resolution of pneumonia findings. 5. Recommend followup of TSH in 4-6 weeks. 6. Home health PT.  73 year old male hospitalized 02/11/14-02/16/14 when he underwent laparoscopic cholecystectomy on 02/15/14 for chronic acalculous cholecystitis, returned on 02/26/14 with complaints of abdominal pain and fever. CT chest showed small PE and CT abdomen/pelvis showed small fluid collection in the gallbladder fossa. Due to concern for abdominal sepsis/peritonitis, patient was transferred to ICU under critical care service on 02/27/14. After improvement, patient was transferred back to the floor to [facility] on 03/03/14.  Hospital Course:  7. Abdominal pain (left upper abdominal pain): Unclear etiology-? Secondary to colonic ileus and esophagitis. s/p laparoscopic cholecystectomy 8/4. EGD 8/19 showed white exudates in the distal esophagus c/w Candida esophagitis, small hiatal hernia and some retained gastric contents. KUB 8/19 showed improving colonic ileus. Patient's narcotics were discontinued. He continued to mobilize. Bowel regimen was initiated. Patient has had BMs daily. Abdominal pain has resolved and he is tolerating diet. GI signed off. As per previous note, patient had left-sided abdominal pain similar to preop state (prior to laparoscopic cholecystectomy). Surgeons evaluated on 8/16 - no surgical needs/signed off.  8. Colonic ileus: Probably multifactorial secondary to  opioids, decreased by mouth intake and immobility. Bowel regimen, minimize opioids and mobilize patient. Resolved. Continue bowel regimen. 9. Acute pulmonary embolism/DVT left peroneal vein: Likely provoked in the setting of recent abdominal surgery. Patient was on heparin infusion but difficult to maintain therapeutic levels. Switched to full dose Lovenox until able to take consistently by mouth. Switched to Xarelto on 8/21 (obtained preauthorization: Case # for 15 mg tabs-[place] [id number] & [place] for 20 mg tablets- [place] [id number]). He will probably need to remain on Xarelto for PAF. 10. Possible healthcare associated pneumonia: Initially treated with Zosyn and then switched to oral levofloxacin and completed 8 days treatment. Blood cultures negative. 11. Hypertension: Resumed atenolol and amlodipine. ACE inhibitors held secondary to recent acute renal failure but can be resumed during outpatient followup pending review of BMP. 12. 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 Patient has history of renal artery stenosis. 13. Anemia of critical illness: Stable. 51. Acute urinary retention: Foley was removed but had to be replaced secondary to ongoing retention. DC Foley 8/21 and monitor for voiding. Opioids discontinued. Voiding without difficulty. Patient has mild penoscrotal edema. He and family have been counseled regarding pulling prepuce over glans daily to avoid paraphimosis & they verbalized understanding. 15. History of CAD: Asymptomatic of chest pain. 16. History of hyperlipidemia: Continue statins 17. Leukocytosis: Resolved 18. PAF: Noted asymptomatic A. fib on monitor between 8/17-8/19 and patient spontaneously reverted to sinus rhythm. No history of A. fib or palpitations. Patient had recurrence of A. Fib with CVR on 8/22 and reverted spontaneously to sinus rhythm. Continue atenolol. Patient on xarelto now for PE/DVT which may need to be continued for  PAF. CHADS 2 score:  2.  19. High TSH: Clinically euthyroid.? Secondary to acute illness. Recommend following TSH in 4-6 weeks. 20. Fluid overload/possible acute diastolic CHF: Likely complicated by to IVF resuscitation and hypoalbuminemia from poor nutritional status. ProBNP 2847. Low dose Lasix initiated, diuresing and improving. Follow closely as outpatient. 21. Hypokalemia: Replaced prior to DC. EKG shows mild prolonged QTC 493 ms-follow outpatient. Potassium 1.7. 22. Chronic benzodiazepine use: Patient states that he uses chronic benzodiazepine at least nightly at bedtime for sleep and back spasms. Advised patient and family not to abruptly stop benzodiazepine and they verbalized understanding. 23. Candida esophagitis: Complete one week of fluconazole. 24.   ROS He is here with his wife, in general feels better. Developed back pain while in the hospital, currently taking Tylenol and a heating pad and is already slightly better. Pain is similar to previous back aches. Edema improved, on Lasix and potassium Denies fever or chills No chest pain or difficulty breathing at this time. GI symptoms are resolved, denies nausea, vomiting, diarrhea, abdominal pain. Taking laxatives and having bowel movements daily. No cough or sputum production  Past Medical History  Diagnosis Date  . HOH (hard of hearing)     has a hearing aid L, sees audiology rountinely  . Allergic rhinitis   . CAD (coronary artery disease)   . GERD (gastroesophageal reflux disease)   . Hyperlipemia   . Hypertension   . Hyperglycemia     A1C 5.8 04-2010  . Pain, joint, multiple sites     uses valium, occ uses for insomnia. uses for shoulder  pain  . Chronic renal insufficiency, stage II (mild)     Cr ~ 1.4, u/s 4-12 normal kidneys  . 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 Personal history of colonic polyps     Past Surgical History  Procedure Laterality Date  . Coronary artery bypass graft  1995  . Colonoscopy   multiple  . Cholecystectomy N/A 02/15/2014    Procedure: LAPAROSCOPIC CHOLECYSTECTOMY with IOC;  Surgeon: Gayland Curry, MD;  Location: [facility];  Service: General;  Laterality: N/A;  . Esophagogastroduodenoscopy N/A 03/02/2014    Procedure: ESOPHAGOGASTRODUODENOSCOPY (EGD);  Surgeon: Ladene Artist, MD;  Location[facility] ENDOSCOPY;  Service: Endoscopy;  Laterality: N/A;  sarah/leone    History   Social History  . Marital Status: Married    Spouse Name: N/A    Number of Children: 1  . Years of Education: N/A   Occupational History  . retired, delivery truck Geophysicist/field seismologist    Social History Main Topics  . Smoking status: Former Smoker    Quit date: 03/13/1976  . Smokeless tobacco: Never Used  . Alcohol Use: No  . Drug Use: No  . Sexual Activity: Not on file   Other Topics Concern  . Not on file   Social History Narrative   Born in [place], has a large extended family, oldest of several brothers-sister             Medication List       This list is accurate as of: 03/10/14 10:59 AM.  Always use your most recent med list.               acetaminophen 325 MG tablet  Commonly known as:  TYLENOL  Take 2 tablets (650 mg total) by mouth every 6 (six) hours as needed for mild pain, moderate pain, fever or headache.     amLODipine 10 MG tablet  Commonly known as:  NORVASC  Take  10 mg by mouth daily.     atenolol 50 MG tablet  Commonly known as:  TENORMIN  Take 75 mg by mouth daily.     atorvastatin 20 MG tablet  Commonly known as:  LIPITOR  Take 20 mg by mouth daily.     benazepril 20 MG tablet  Commonly known as:  LOTENSIN  Take 1 tablet by mouth daily.     bisacodyl 5 MG EC tablet  Commonly known as:  DULCOLAX  Take 1 tablet (5 mg total) by mouth daily as needed for moderate constipation.     diazepam 10 MG tablet  Commonly known as:  VALIUM  Take 10 mg by mouth every 12 (twelve) hours as needed for anxiety.     DSS 100 MG Caps  Take 100 mg by mouth 2 (two) times  daily.     esomeprazole 40 MG capsule  Commonly known as:  NEXIUM  Take 1 capsule (40 mg total) by mouth daily.     fexofenadine 180 MG tablet  Commonly known as:  ALLEGRA  Take 180 mg by mouth daily.     furosemide 20 MG tablet  Commonly known as:  LASIX  Take 1 tablet (20 mg total) by mouth daily.     niacin 500 MG CR tablet  Commonly known as:  NIASPAN  Take 500 mg by mouth at bedtime.     polyethylene glycol packet  Commonly known as:  MIRALAX / GLYCOLAX  Take 17 g by mouth 2 (two) times daily.     potassium chloride 10 MEQ tablet  Commonly known as:  K-DUR,KLOR-CON  Take 1 tablet by mouth daily.     rivaroxaban 20 MG Tabs tablet  Commonly known as:  XARELTO  Take 1 tablet (20 mg total) by mouth daily with supper. First dose on Fri 03/25/14 at 1700  Start taking on:  03/25/2014           Objective:   Physical Exam BP 138/68  Pulse 62  Temp(Src) 98.8 F (37.1 C) (Oral)  Wt 191 lb 6 oz (86.807 kg)  SpO2 96% General -- alert, well-developed, NAD.  HEENT-- Not pale.   Lungs -- normal respiratory effort, no intercostal retractions, no accessory muscle use, and normal breath sounds.  Heart-- seems regular today  Abdomen-- Not distended, good bowel sounds,soft, non-tender, no bruit . No rebound or rigidity.  Extremities-- R leg  ++/+++, L leg +/+++ Neurologic--  alert & oriented X3. Speech normal,  strength symmetric and appropriate for age.  Posture antalgic due to back pain Psych-- Cognition and judgment appear intact. Cooperative with normal attention span and concentration. No anxious or depressed appearing.        Assessment & Plan:    Recently admitted with multiple medical problems, seems to be recovering well. He has physical therapy  at home starting today.  Pneumonia, now asymptomatic, I asked the patient to come back in one month, we'll need a chest x-ray at that time  DVT and PE, on xarelto For at least 3 months, this was a provoked event. May  need xarelto for longer d/t  atrial fibrillation.  Atrial fibrillation,   on xarelto, seems on RRR today, cardiology eval pending, will send a referral although the wife already contacted cardiology  Back pain, back pain since he was in the hospital, likely an exacerbation of chronic back pain however he is on anticoagulation. I asked the patient to call me immediately if the pain is not improving  or if you get worse.  GI symptoms, currently tolerating by mouth well, nearly asymptomatic, needs to see GI. Was dx w/ candida esophagitis, s/p diflucan x 7 days   Abnormal TSH, will recheck on return to the office  Check labs   Needs paperwork for his insurance company    Today , I spent more than  45  min with the patient: >50% of the time counseling regards above problems,  reviewing the chart and labs ordered by other providers  and coordinating his care (forms)

## 2014-03-10 NOTE — Progress Notes (Signed)
Pre-visit discussion using our clinic review tool. No additional management support is needed unless otherwise documented below in the visit note.  

## 2014-03-10 NOTE — Patient Instructions (Signed)
Get your blood work before you leave   Next visit is for routine check up in one month  If the back pain is not improving soon or  if it gets worse or if you have abdominal pain, fever, chills, chest pain, difficulty breathing, increased edema please ----> call the office  Be sure you see your gastroenterologist and cardiologist.  Keep legs elevated, avoid  excessive salt

## 2014-03-15 ENCOUNTER — Encounter: Payer: Self-pay | Admitting: Physician Assistant

## 2014-03-15 ENCOUNTER — Encounter (INDEPENDENT_AMBULATORY_CARE_PROVIDER_SITE_OTHER): Payer: Medicare Other

## 2014-03-15 ENCOUNTER — Telehealth: Payer: Self-pay

## 2014-03-15 ENCOUNTER — Ambulatory Visit (INDEPENDENT_AMBULATORY_CARE_PROVIDER_SITE_OTHER): Payer: Medicare Other | Admitting: Physician Assistant

## 2014-03-15 VITALS — BP 150/70 | HR 62 | Ht 70.0 in | Wt 183.0 lb

## 2014-03-15 DIAGNOSIS — I701 Atherosclerosis of renal artery: Secondary | ICD-10-CM

## 2014-03-15 DIAGNOSIS — I2699 Other pulmonary embolism without acute cor pulmonale: Secondary | ICD-10-CM

## 2014-03-15 DIAGNOSIS — I2510 Atherosclerotic heart disease of native coronary artery without angina pectoris: Secondary | ICD-10-CM

## 2014-03-15 DIAGNOSIS — I4891 Unspecified atrial fibrillation: Secondary | ICD-10-CM | POA: Insufficient documentation

## 2014-03-15 DIAGNOSIS — I480 Paroxysmal atrial fibrillation: Secondary | ICD-10-CM | POA: Insufficient documentation

## 2014-03-15 DIAGNOSIS — N189 Chronic kidney disease, unspecified: Secondary | ICD-10-CM

## 2014-03-15 DIAGNOSIS — I10 Essential (primary) hypertension: Secondary | ICD-10-CM

## 2014-03-15 DIAGNOSIS — E785 Hyperlipidemia, unspecified: Secondary | ICD-10-CM

## 2014-03-15 NOTE — Progress Notes (Signed)
Cardiology Office Note    Date:  03/15/2014   ID:  Dustin Ashley, DOB [birth year], MRN [id number]  PCP:  [name], MD  Cardiologist:  Dr. Sherren Mocha     History of Present Illness: Dustin Ashley is a 73 y.o. male with a hx of CAD s/p CABG in 1992, bilateral RAS managed medically, HTN, HL, CKD.  Last seen by Dr. Sherren Mocha in 06/2013.  The patient was admitted 7/31-8/5 for lap cholecystectomy 2/2 chronic acalculous cholecystitis.  He was readmitted 8/15-8/23.  He presented with fever and abdominal pain.  He was treated for possible HCAP, Candida esophagitis, ileus.  Chest CT demonstrated LLL pulmonary emboli and he was placed on anticoagulation.  Hospitalization was c/b AKI in setting of hypovolemia and sepsis.  He developed volume overload with fluid resuscitation and was diuresed. Echo demonstrated normal LVF.  TSH was noted to be elevated and outpatient FU was recommended in several weeks.  Tele demonstrated runs of PAFib with RVR.  I reviewed the strips in his chart myself today.  He did have HRs into the 140s at times and there was one post termination pause of 2.8 seconds.  He remained in NSR at DC.  He was placed on Xarelto for his PE.  He returns for FU.    He is weak but continues to improve. He denies chest pain, shortness of breath, syncope, orthopnea, PND or edema.   Studies:  - Echo (8/15):  EF 55%, abnormal septal motion, mild LAE  - Renal Art [place] (12/14):  Normal Aorta, mild stenosis distal aorta and celiac axis, mod stable stenosis SMA and IMA, normal kidney size, stable bilateral RA > 60%   Recent Labs/Images: 02/12/2014: HDL Cholesterol by NMR 40; LDL (calc) 58  02/27/2014: Pro B Natriuretic peptide (BNP) 2847.0*  03/02/2014: ALT 27  03/05/2014: TSH 5.690*  03/10/2014: Creatinine 1.0; Hemoglobin 13.7; Potassium 3.8   Ct Angio Chest Pe W/cm &/or Wo Cm  02/26/2014    IMPRESSION: 1. Small pulmonary emboli within a subsegmental left lower lobe pulmonary arteries.  Overall clot burden is minimal. 2. Bibasilar atelectasis and concern for airspace disease at left lung base. This could represent pneumonia or aspiration pneumonitis. Small pulmonary infarction would also be in differential. Findings conveyed toVRINDA PICKERING on 02/26/2014  at18:39.   Electronically Signed   By: Suzy Bouchard M.D.   On: 02/26/2014 18:39    Wt Readings from Last 3 Encounters:  03/15/14 183 lb (83.008 kg)  03/10/14 191 lb 6 oz (86.807 kg)  03/06/14 201 lb 3.2 oz (91.264 kg)     Past Medical History  Diagnosis Date  . HOH (hard of hearing)     has a hearing aid L, sees audiology rountinely  . Allergic rhinitis   . CAD (coronary artery disease)   . GERD (gastroesophageal reflux disease)   . Hyperlipemia   . Hypertension   . Hyperglycemia     A1C 5.8 04-2010  . Pain, joint, multiple sites     uses valium, occ uses for insomnia. uses for shoulder  pain  . Chronic renal insufficiency, stage II (mild)     Cr ~ 1.4, u/s 4-12 normal kidneys  . 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 Personal history of colonic polyps     Current Outpatient Prescriptions  Medication Sig Dispense Refill  . acetaminophen (TYLENOL) 325 MG tablet Take 2 tablets (650 mg total) by mouth every 6 (six) hours as needed for mild pain, moderate  pain, fever or headache.      Marland Kitchen amLODipine (NORVASC) 10 MG tablet Take 10 mg by mouth daily.      Marland Kitchen atenolol (TENORMIN) 50 MG tablet Take 75 mg by mouth daily.      Marland Kitchen atorvastatin (LIPITOR) 20 MG tablet Take 20 mg by mouth daily.       . benazepril (LOTENSIN) 20 MG tablet Take 1 tablet by mouth daily.      . bisacodyl (DULCOLAX) 5 MG EC tablet Take 1 tablet (5 mg total) by mouth daily as needed for moderate constipation.  30 tablet  0  . diazepam (VALIUM) 10 MG tablet Take 10 mg by mouth every 12 (twelve) hours as needed for anxiety.      . docusate sodium 100 MG CAPS Take 100 mg by mouth 2 (two) times daily.  30 capsule  0  . esomeprazole (NEXIUM) 40 MG  capsule Take 1 capsule (40 mg total) by mouth daily.      . fexofenadine (ALLEGRA) 180 MG tablet Take 180 mg by mouth daily.        . furosemide (LASIX) 20 MG tablet Take 1 tablet (20 mg total) by mouth daily.  30 tablet  0  . niacin (NIASPAN) 500 MG CR tablet Take 500 mg by mouth 2 (two) times daily. AT BEDTIME      . polyethylene glycol (MIRALAX / GLYCOLAX) packet Take 17 g by mouth 2 (two) times daily.  30 each  0  . potassium chloride (K-DUR,KLOR-CON) 10 MEQ tablet Take 1 tablet by mouth daily.      Derrill Memo ON 03/25/2014] rivaroxaban (XARELTO) 20 MG TABS tablet Take 1 tablet (20 mg total) by mouth daily with supper. First dose on Fri 03/25/14 at 1700  30 tablet  0   No current facility-administered medications for this visit.     Allergies:   Hydrocodone-acetaminophen and Tramadol hcl   Social History:  The patient  reports that he quit smoking about 38 years ago. He has never used smokeless tobacco. He reports that he does not drink alcohol or use illicit drugs.   Family History:  The patient's family history includes Heart attack in his brother, father, and another family member; Hypertension in his father. There is no history of Cancer or Diabetes.   ROS:  Please see the history of present illness.   He denies any bleeding problems.   All other systems reviewed and negative.   PHYSICAL EXAM: VS:  BP 150/70  Pulse 62  Ht 5\' 10"  (1.778 m)  Wt 183 lb (83.008 kg)  BMI 26.26 kg/m2 Well nourished, well developed, in no acute distress HEENT: normal Neck:  no JVD Cardiac:  normal S1, S2;  RRR; no murmur Lungs:   clear to auscultation bilaterally, no wheezing, rhonchi or rales Abd: soft, nontender, no hepatomegaly Ext:  no edema Skin: warm and dry Neuro:  CNs 2-12 intact, no focal abnormalities noted  EKG:  NSR, HR 62, first degree AV block (PR 354 ms),  no significant ST changes, no change from prior tracing     ASSESSMENT AND PLAN:  1. Paroxysmal atrial fibrillation: He is  maintaining NSR.  His atrial fibrillation did occur in the context of an acute illness.  CHADS2-VASc=3.  Given his stroke risk profile, he would benefit from long-term anticoagulation.  After he completes his treatment for his pulmonary embolism, consider continuing Xarelto long-term as long as he is tolerating anticoagulation.  2. CORONARY ARTERY DISEASE: No angina. He  is not on aspirin as he is Xarelto. Continue statin, beta blocker. 3. Pulmonary embolism: Continue Xarelto. 4. RAS (renal artery stenosis): Arrange followup duplex in December. 5. HYPERTENSION: Borderline control. He monitors his blood pressure at home and the readings are optimal. Continue to monitor. 6. HYPERLIPIDEMIA: Continue statin. 7. CKD (chronic kidney disease): Recent creatinine stable.   Disposition:  FU with Dr. Sherren Mocha in 06/2014.    Signed, Versie Starks, MHS 03/15/2014 11:02 AM    [facility] [address], [place], [place]  [address] Phone: [phone]; Fax: [phone]

## 2014-03-15 NOTE — Telephone Encounter (Signed)
LMOM for Pt to return call about his paperwork that is ready for pick up.

## 2014-03-15 NOTE — Telephone Encounter (Signed)
Informed patient that paperwork was ready for pick up

## 2014-03-15 NOTE — Patient Instructions (Signed)
Your physician recommends that you continue on your current medications as directed. Please refer to the Current Medication list given to you today.  YOU WILL NEED TO HAVE RENAL ARTERY DUPLEX ULTRASOUND IN 06/2014  YOU WILL NEED TO FOLLOW UP WITH DR. Burt Knack IN 06/2014 AFTER YOUR ULTRASOUND HAS BEEN COMPLETED

## 2014-03-15 NOTE — Telephone Encounter (Signed)
Paperwork placed at front desk for pick up.

## 2014-03-16 ENCOUNTER — Telehealth: Payer: Self-pay

## 2014-03-16 NOTE — Telephone Encounter (Signed)
Received Home Health Certification and Plan of Care forms from [employer] via fax.  Forms were labeled and placed in Dr. Ethel Rana red folder for review and signature.

## 2014-03-23 ENCOUNTER — Telehealth: Payer: Self-pay | Admitting: Internal Medicine

## 2014-03-23 NOTE — Telephone Encounter (Signed)
Thanks

## 2014-03-23 NOTE — Telephone Encounter (Signed)
[facility] calling in regards to pt, pt was discharged on 03/17/14 with all goals met and is no longer home bound.

## 2014-03-23 NOTE — Telephone Encounter (Signed)
FYI

## 2014-03-24 NOTE — Telephone Encounter (Signed)
Completed.

## 2014-04-06 ENCOUNTER — Other Ambulatory Visit: Payer: Self-pay

## 2014-04-06 MED ORDER — FUROSEMIDE 20 MG PO TABS: 20.0000 mg | ORAL_TABLET | Freq: Every day | ORAL | Status: DC

## 2014-04-12 ENCOUNTER — Encounter: Payer: Self-pay | Admitting: Internal Medicine

## 2014-04-12 ENCOUNTER — Ambulatory Visit (HOSPITAL_BASED_OUTPATIENT_CLINIC_OR_DEPARTMENT_OTHER)
Admission: RE | Admit: 2014-04-12 | Discharge: 2014-04-12 | Disposition: A | Discharge status: Home / Self Care | Payer: Medicare Other | Source: Ambulatory Visit | Attending: Internal Medicine | Admitting: Internal Medicine

## 2014-04-12 ENCOUNTER — Ambulatory Visit (INDEPENDENT_AMBULATORY_CARE_PROVIDER_SITE_OTHER): Payer: Medicare Other | Admitting: Internal Medicine

## 2014-04-12 VITALS — BP 128/62 | HR 62 | Temp 98.1°F | Ht 69.0 in | Wt 188.4 lb

## 2014-04-12 DIAGNOSIS — J189 Pneumonia, unspecified organism: Secondary | ICD-10-CM

## 2014-04-12 DIAGNOSIS — R7989 Other specified abnormal findings of blood chemistry: Secondary | ICD-10-CM

## 2014-04-12 DIAGNOSIS — I2510 Atherosclerotic heart disease of native coronary artery without angina pectoris: Secondary | ICD-10-CM | POA: Insufficient documentation

## 2014-04-12 DIAGNOSIS — K2100 Gastro-esophageal reflux disease with esophagitis, without bleeding: Secondary | ICD-10-CM

## 2014-04-12 DIAGNOSIS — M549 Dorsalgia, unspecified: Secondary | ICD-10-CM

## 2014-04-12 DIAGNOSIS — Z951 Presence of aortocoronary bypass graft: Secondary | ICD-10-CM | POA: Diagnosis not present

## 2014-04-12 DIAGNOSIS — I2699 Other pulmonary embolism without acute cor pulmonale: Secondary | ICD-10-CM

## 2014-04-12 DIAGNOSIS — I10 Essential (primary) hypertension: Secondary | ICD-10-CM

## 2014-04-12 DIAGNOSIS — Z23 Encounter for immunization: Secondary | ICD-10-CM

## 2014-04-12 DIAGNOSIS — Z0000 Encounter for general adult medical examination without abnormal findings: Secondary | ICD-10-CM

## 2014-04-12 DIAGNOSIS — E785 Hyperlipidemia, unspecified: Secondary | ICD-10-CM

## 2014-04-12 DIAGNOSIS — R946 Abnormal results of thyroid function studies: Secondary | ICD-10-CM

## 2014-04-12 LAB — BASIC METABOLIC PANEL
BUN: 21 mg/dL (ref 6–23)
CHLORIDE: 105 meq/L (ref 96–112)
CO2: 24 mEq/L (ref 19–32)
CREATININE: 1.2 mg/dL (ref 0.4–1.5)
Calcium: 9.2 mg/dL (ref 8.4–10.5)
GFR: 61.23 mL/min (ref >60.00)
Glucose, Bld: 106 mg/dL — ABNORMAL HIGH (ref 70–99)
Potassium: 3.7 mEq/L (ref 3.5–5.1)
Sodium: 139 mEq/L (ref 135–145)

## 2014-04-12 LAB — T3, FREE: T3, Free: 3.3 pg/mL (ref 2.3–4.2)

## 2014-04-12 LAB — TSH: TSH: 0.42 u[IU]/mL (ref 0.35–4.50)

## 2014-04-12 LAB — T4, FREE: Free T4: 1.08 ng/dL (ref 0.60–1.60)

## 2014-04-12 MED ORDER — RIVAROXABAN 20 MG PO TABS
20.0000 mg | ORAL_TABLET | Freq: Every day | ORAL | Status: DC
Start: 2014-04-12 — End: 2014-08-13

## 2014-04-12 NOTE — Progress Notes (Signed)
Pre visit review using our clinic review tool, if applicable. No additional management support is needed unless otherwise documented below in the visit note. 

## 2014-04-12 NOTE — Assessment & Plan Note (Addendum)
Td 2008 PNM shot 2008 prevnar today Elected regular flu shot-- provided today shingles vaccine -- discussed before  Due for cscope this year-- reasses on RTC DRE-- PSA  Normal 2014, reasses next year

## 2014-04-12 NOTE — Patient Instructions (Signed)
Get your blood work before you leave   Stop by the first floor and get the XR   Stop by the front desk and schedule labs to be done within few days (fasting)  Please come back to the office 3 months  for a routine check up

## 2014-04-12 NOTE — Assessment & Plan Note (Signed)
Well controlled with current medications, he ran out of Lasix and potassium a week ago, plan not to restart them for now. Check a BMP, take ambulatory BPs

## 2014-04-12 NOTE — Assessment & Plan Note (Signed)
History of DVT and PE dx 02-2014, he continue with xarelto; he also has atrial fibrillation

## 2014-04-12 NOTE — Assessment & Plan Note (Signed)
Has not seen GI regards Candida esophagitis, he is asymptomatic.  Plan: Continue PPIs

## 2014-04-12 NOTE — Assessment & Plan Note (Signed)
Well controlled 

## 2014-04-12 NOTE — Assessment & Plan Note (Signed)
And also right pneumonia 02-2014, asymptomatic, check a chest x-ray

## 2014-04-12 NOTE — Assessment & Plan Note (Signed)
aic stable over the years ~ 6.0

## 2014-04-12 NOTE — Assessment & Plan Note (Signed)
Recent TSH elevated, labs

## 2014-04-12 NOTE — Assessment & Plan Note (Signed)
Occasional back pain, Not an issue at this time

## 2014-04-12 NOTE — Progress Notes (Signed)
Subjective:    Patient ID: BYFORD SCHOOLS, male    DOB: [birth year], 73 y.o.   MRN: [id number]  DOS:  04/12/2014 Type of visit - description :   Here for Medicare AWV:   1. Risk factors based on Past M, S, F history: reviewed   2. Physical Activities: physical activity very  Limited since surgery (GB) 01-2014 3. Depression/mood: Neg screening 4. 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 ADL's: Independent  , drives  6. Fall Risk: prevention discussed , no recent falls 7. home Safety: does feel safe at home   8. Height, weight, & visual acuity: see VS, vision ok w/ glasses, sees eye doctor   9. Counseling: provided   10. Labs ordered based on risk factors: if needed   11. Referral Coordination: if needed   12. Care Plan, see assessment and plan   13. Cognitive Assessment: motor skills and cognition appropriate for age     In addition, today we discussed the following: Status post gallbladder surgery, appetite is normal, no postprandial pain. History of esophagitis, denies any heartburn, dysphagia or odynophagia. History of PE and atrial fibrillation (paroxysmals) on xarelto, saw cards recently , stable , next visit 06-2014 Hypertension, recent diagnosis of CHF.  Patient ran out of Lasix and potassium a week ago, see assessment and plan History of recent pneumonia, asymptomatic, no cough. Concern about pertussis, a church member was diagnosed with it, she was completely asymptomatic  . Patient remains symptomatic   ROS No fever or chills No nausea, vomiting, diarrhea or blood in the stools. No chest painor difficulty breathing No ST, cough, wheezing  No dysuria, gross hematuria.  Past Medical History  Diagnosis Date  . HOH (hard of hearing)     has a hearing aid L, sees audiology rountinely  . Allergic rhinitis   . CAD (coronary artery disease)   . GERD (gastroesophageal reflux disease)   . Hyperlipemia   . Hypertension   . Hyperglycemia     A1C 5.8 04-2010  .  Pain, joint, multiple sites     uses valium, occ uses for insomnia. uses for shoulder  pain  . Chronic renal insufficiency, stage II (mild)     Cr ~ 1.4, u/s 4-12 normal kidneys  . 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 Personal history of colonic polyps   . Pulmonary emboli     02-2014  . Paroxysmal atrial fibrillation     Past Surgical History  Procedure Laterality Date  . Coronary artery bypass graft  1995  . Colonoscopy  multiple  . Cholecystectomy N/A 02/15/2014    Procedure: LAPAROSCOPIC CHOLECYSTECTOMY with IOC;  Surgeon: Gayland Curry, MD;  Location: [place];  Service: General;  Laterality: N/A;  . Esophagogastroduodenoscopy N/A 03/02/2014    Procedure: ESOPHAGOGASTRODUODENOSCOPY (EGD);  Surgeon: Ladene Artist, MD;  Location: [facility] ENDOSCOPY;  Service: Endoscopy;  Laterality: N/A;  sarah/leone    History   Social History  . Marital Status: Married    Spouse Name: N/A    Number of Children: 1  . Years of Education: N/A   Occupational History  . retired, delivery truck Geophysicist/field seismologist    Social History Main Topics  . Smoking status: Former Smoker    Quit date: 03/13/1976  . Smokeless tobacco: Never Used  . Alcohol Use: No  . Drug Use: No  . Sexual Activity: Not on file   Other Topics Concern  . Not on file  Social History Narrative   Born in [place], has a large extended family, oldest of several brothers-sister          Family History  Problem Relation Age of Onset  . Hypertension Father   . Heart attack Brother     2 brothers, CABG  . Cancer Neg Hx     colon, prostate  . Diabetes Neg Hx        Medication List       This list is accurate as of: 04/12/14  9:55 AM.  Always use your most recent med list.               acetaminophen 325 MG tablet  Commonly known as:  TYLENOL  Take 2 tablets (650 mg total) by mouth every 6 (six) hours as needed for mild pain, moderate pain, fever or headache.     amLODipine 10 MG tablet  Commonly known as:  NORVASC    Take 10 mg by mouth daily.     atenolol 50 MG tablet  Commonly known as:  TENORMIN  Take 75 mg by mouth daily.     atorvastatin 20 MG tablet  Commonly known as:  LIPITOR  Take 20 mg by mouth daily.     benazepril 20 MG tablet  Commonly known as:  LOTENSIN  Take 1 tablet by mouth daily.     diazepam 10 MG tablet  Commonly known as:  VALIUM  Take 10 mg by mouth every 12 (twelve) hours as needed for anxiety.     esomeprazole 40 MG capsule  Commonly known as:  NEXIUM  Take 1 capsule (40 mg total) by mouth daily.     fexofenadine 180 MG tablet  Commonly known as:  ALLEGRA  Take 180 mg by mouth daily.     furosemide 20 MG tablet  Commonly known as:  LASIX  Take 1 tablet (20 mg total) by mouth daily.     niacin 500 MG CR tablet  Commonly known as:  NIASPAN  Take 500 mg by mouth 2 (two) times daily. AT BEDTIME     polyethylene glycol packet  Commonly known as:  MIRALAX / GLYCOLAX  Take 17 g by mouth once as needed.     potassium chloride 10 MEQ tablet  Commonly known as:  K-DUR,KLOR-CON  Take 1 tablet by mouth daily.     rivaroxaban 20 MG Tabs tablet  Commonly known as:  XARELTO  Take 1 tablet (20 mg total) by mouth daily with supper. First dose on Fri 03/25/14 at 1700           Objective:   Physical Exam BP 128/62  Pulse 62  Temp(Src) 98.1 F (36.7 C) (Oral)  Ht 5\' 9"  (1.753 m)  Wt 188 lb 6 oz (85.446 kg)  BMI 27.81 kg/m2  SpO2 95% General -- alert, well-developed, NAD.  Lungs -- normal respiratory effort, no intercostal retractions, no accessory muscle use, and normal breath sounds.  Heart-- normal rate, regular rhythm, no murmur.  Abdomen-- Not distended, good bowel sounds,soft, non-tender.  Extremities-- no pretibial edema bilaterally  Neurologic--  alert & oriented X3. Speech normal, gait appropriate for age, strength symmetric and appropriate for age.  Psych-- Cognition and judgment appear intact. Cooperative with normal attention span and  concentration. No anxious or depressed appearing.       Assessment & Plan:

## 2014-04-19 ENCOUNTER — Other Ambulatory Visit: Payer: Self-pay

## 2014-04-19 ENCOUNTER — Telehealth: Payer: Self-pay

## 2014-04-19 ENCOUNTER — Other Ambulatory Visit: Payer: Self-pay | Admitting: Internal Medicine

## 2014-04-19 MED ORDER — DIAZEPAM 10 MG PO TABS: 10.0000 mg | ORAL_TABLET | Freq: Two times a day (BID) | ORAL | Status: DC | PRN

## 2014-04-19 NOTE — Telephone Encounter (Signed)
Pt is requesting refill on Diazepam.  Last OV: 04/12/2014  Last Fill: 09/14/2013 # 60 with 0 RF UDS: 04/08/2013 Low risk   Please advise.

## 2014-04-19 NOTE — Telephone Encounter (Signed)
Faxed to [facility]. 

## 2014-04-19 NOTE — Telephone Encounter (Signed)
done

## 2014-04-20 ENCOUNTER — Encounter: Payer: Self-pay | Admitting: Internal Medicine

## 2014-04-20 ENCOUNTER — Ambulatory Visit (INDEPENDENT_AMBULATORY_CARE_PROVIDER_SITE_OTHER): Payer: Medicare Other | Admitting: Internal Medicine

## 2014-04-20 VITALS — BP 148/68 | HR 63 | Temp 98.4°F | Wt 189.1 lb

## 2014-04-20 DIAGNOSIS — W5581XA Bitten by other mammals, initial encounter: Secondary | ICD-10-CM

## 2014-04-20 DIAGNOSIS — T148 Other injury of unspecified body region: Secondary | ICD-10-CM

## 2014-04-20 DIAGNOSIS — Z418 Encounter for other procedures for purposes other than remedying health state: Secondary | ICD-10-CM

## 2014-04-20 DIAGNOSIS — Z299 Encounter for prophylactic measures, unspecified: Secondary | ICD-10-CM

## 2014-04-20 DIAGNOSIS — M7989 Other specified soft tissue disorders: Secondary | ICD-10-CM

## 2014-04-20 DIAGNOSIS — T148XXA Other injury of unspecified body region, initial encounter: Secondary | ICD-10-CM

## 2014-04-20 DIAGNOSIS — Z23 Encounter for immunization: Secondary | ICD-10-CM

## 2014-04-20 MED ORDER — DOXYCYCLINE HYCLATE 100 MG PO TABS: 100.0000 mg | ORAL_TABLET | Freq: Two times a day (BID) | ORAL | Status: DC

## 2014-04-20 NOTE — Progress Notes (Signed)
Subjective:    Patient ID: Dustin Ashley, male    DOB: 05-Mar-1941, 73 y.o.   MRN: [id number]  DOS:  04/20/2014 Type of visit - description : acute Interval history: Last week, his puppy bite him at the  left hand, very superficially. 5 days ago developed pain at the base of the left thumb, 2 days later the hand was swollen, slightly red and tender to palpation in a glove distribution. Since then the swelling has decreased and is almost back to normal.    ROS Denies fever or chills Did have some pain proximally around the elbow but that is gone  No fever or chills No history of gout, has not eaten anything different lately.    Past Medical History  Diagnosis Date  . HOH (hard of hearing)     has a hearing aid L, sees audiology rountinely  . Allergic rhinitis   . CAD (coronary artery disease)   . GERD (gastroesophageal reflux disease)   . Hyperlipemia   . Hypertension   . Hyperglycemia     A1C 5.8 04-2010  . Pain, joint, multiple sites     uses valium, occ uses for insomnia. uses for shoulder  pain  . Chronic renal insufficiency, stage II (mild)     Cr ~ 1.4, u/s 4-12 normal kidneys  . 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 Personal history of colonic polyps   . Pulmonary emboli     02-2014  . Paroxysmal atrial fibrillation     Past Surgical History  Procedure Laterality Date  . Coronary artery bypass graft  1995  . Colonoscopy  multiple  . Cholecystectomy N/A 02/15/2014    Procedure: LAPAROSCOPIC CHOLECYSTECTOMY with IOC;  Surgeon: Gayland Curry, MD;  Location: [place];  Service: General;  Laterality: N/A;  . Esophagogastroduodenoscopy N/A 03/02/2014    Procedure: ESOPHAGOGASTRODUODENOSCOPY (EGD);  Surgeon: Ladene Artist, MD;  [facility] ENDOSCOPY;  Service: Endoscopy;  Laterality: N/A;  sarah/leone    History   Social History  . Marital Status: Married    Spouse Name: N/A    Number of Children: 1  . Years of Education: N/A   Occupational History  .  retired, delivery truck Geophysicist/field seismologist    Social History Main Topics  . Smoking status: Former Smoker    Quit date: 03/13/1976  . Smokeless tobacco: Never Used  . Alcohol Use: No  . Drug Use: No  . Sexual Activity: Not on file   Other Topics Concern  . Not on file   Social History Narrative   Born in [place], has a large extended family, oldest of several brothers-sister             Medication List       This list is accurate as of: 04/20/14 11:59 PM.  Always use your most recent med list.               acetaminophen 325 MG tablet  Commonly known as:  TYLENOL  Take 2 tablets (650 mg total) by mouth every 6 (six) hours as needed for mild pain, moderate pain, fever or headache.     amLODipine 10 MG tablet  Commonly known as:  NORVASC  Take 10 mg by mouth daily.     atenolol 50 MG tablet  Commonly known as:  TENORMIN  Take 75 mg by mouth daily.     atorvastatin 20 MG tablet  Commonly known as:  LIPITOR  Take 20 mg by mouth  daily.     benazepril 20 MG tablet  Commonly known as:  LOTENSIN  TAKE 1 TABLET BY MOUTH TWICE A DAY     diazepam 10 MG tablet  Commonly known as:  VALIUM  Take 1 tablet (10 mg total) by mouth every 12 (twelve) hours as needed for anxiety.     doxycycline 100 MG tablet  Commonly known as:  VIBRA-TABS  Take 1 tablet (100 mg total) by mouth 2 (two) times daily.     esomeprazole 40 MG capsule  Commonly known as:  NEXIUM  Take 1 capsule (40 mg total) by mouth daily.     fexofenadine 180 MG tablet  Commonly known as:  ALLEGRA  Take 180 mg by mouth daily.     furosemide 20 MG tablet  Commonly known as:  LASIX  Take 1 tablet (20 mg total) by mouth daily.     niacin 500 MG CR tablet  Commonly known as:  NIASPAN  Take 500 mg by mouth 2 (two) times daily. AT BEDTIME     polyethylene glycol packet  Commonly known as:  MIRALAX / GLYCOLAX  Take 17 g by mouth once as needed.     potassium chloride 10 MEQ tablet  Commonly known as:   K-DUR,KLOR-CON  Take 1 tablet by mouth daily.     rivaroxaban 20 MG Tabs tablet  Commonly known as:  XARELTO  Take 1 tablet (20 mg total) by mouth daily with supper.           Objective:   Physical Exam  Musculoskeletal:       Arms:  BP 148/68  Pulse 63  Temp(Src) 98.4 F (36.9 C) (Oral)  Wt 189 lb 2 oz (85.787 kg)  SpO2 96% General -- alert, well-developed, NAD.   Extremities--  R UE normal Neurologic--  alert & oriented X3. Speech normal, gait appropriate for age, strength symmetric and appropriate for age.  Psych-- Cognition and judgment appear intact. Cooperative with normal attention span and concentration. No anxious or depressed appearing.        Assessment & Plan:  Hand swelling  Resolving gout? Resolving cellulitis (puppy bite ) ? Plan: Td, watch the   puppy, be sure he has all his shots up-to-date Doxycycline for 5 days Call if symptoms resurface

## 2014-04-20 NOTE — Patient Instructions (Signed)
Take the antibiotic as prescribed for 5 days Is a swelling and pain return to the Left hand --- please let us know

## 2014-04-20 NOTE — Progress Notes (Signed)
Pre visit review using our clinic review tool, if applicable. No additional management support is needed unless otherwise documented below in the visit note. 

## 2014-05-12 ENCOUNTER — Telehealth: Payer: Self-pay | Admitting: Internal Medicine

## 2014-05-12 DIAGNOSIS — H9191 Unspecified hearing loss, right ear: Secondary | ICD-10-CM

## 2014-05-12 DIAGNOSIS — H9192 Unspecified hearing loss, left ear: Secondary | ICD-10-CM

## 2014-05-12 NOTE — Telephone Encounter (Signed)
,  Caller name: Antone, Summons Relation to pt: spouse  Call back number: [phone]   Reason for call:   Pt requesting a referral for hearing specialist Dr. Warren Lacy [phone] [facility].

## 2014-05-12 NOTE — Telephone Encounter (Signed)
Referral placed to Dr. Warren Lacy as requested.

## 2014-06-13 ENCOUNTER — Other Ambulatory Visit: Payer: Self-pay

## 2014-06-18 ENCOUNTER — Other Ambulatory Visit: Payer: Self-pay | Admitting: Internal Medicine

## 2014-06-20 ENCOUNTER — Other Ambulatory Visit: Payer: Self-pay

## 2014-06-27 ENCOUNTER — Encounter: Payer: Self-pay | Admitting: Internal Medicine

## 2014-06-28 ENCOUNTER — Ambulatory Visit (HOSPITAL_COMMUNITY): Payer: Medicare Other | Attending: Cardiology | Admitting: Cardiology

## 2014-06-28 DIAGNOSIS — E785 Hyperlipidemia, unspecified: Secondary | ICD-10-CM | POA: Diagnosis not present

## 2014-06-28 DIAGNOSIS — N182 Chronic kidney disease, stage 2 (mild): Secondary | ICD-10-CM | POA: Insufficient documentation

## 2014-06-28 DIAGNOSIS — I2510 Atherosclerotic heart disease of native coronary artery without angina pectoris: Secondary | ICD-10-CM | POA: Diagnosis not present

## 2014-06-28 DIAGNOSIS — I701 Atherosclerosis of renal artery: Secondary | ICD-10-CM | POA: Insufficient documentation

## 2014-06-28 DIAGNOSIS — I10 Essential (primary) hypertension: Secondary | ICD-10-CM

## 2014-06-28 NOTE — Progress Notes (Signed)
Renal artery duplex performed  

## 2014-07-02 ENCOUNTER — Other Ambulatory Visit: Payer: Self-pay | Admitting: Internal Medicine

## 2014-07-05 ENCOUNTER — Encounter: Payer: Self-pay | Admitting: Cardiovascular Disease

## 2014-07-05 NOTE — Telephone Encounter (Signed)
New Msg      Pt wife Dustin Ashley calling, would like to be called back with test results from 06/28/14 appt.

## 2014-07-05 NOTE — Telephone Encounter (Signed)
This encounter was created in error - please disregard.

## 2014-07-12 ENCOUNTER — Ambulatory Visit (INDEPENDENT_AMBULATORY_CARE_PROVIDER_SITE_OTHER): Payer: Medicare Other | Admitting: Internal Medicine

## 2014-07-12 ENCOUNTER — Encounter: Payer: Self-pay | Admitting: Internal Medicine

## 2014-07-12 VITALS — BP 133/75 | HR 53 | Temp 97.9°F | Ht 69.0 in | Wt 189.4 lb

## 2014-07-12 DIAGNOSIS — I480 Paroxysmal atrial fibrillation: Secondary | ICD-10-CM

## 2014-07-12 DIAGNOSIS — I2699 Other pulmonary embolism without acute cor pulmonale: Secondary | ICD-10-CM

## 2014-07-12 DIAGNOSIS — I10 Essential (primary) hypertension: Secondary | ICD-10-CM

## 2014-07-12 LAB — BASIC METABOLIC PANEL
BUN: 23 mg/dL (ref 6–23)
CO2: 29 meq/L (ref 19–32)
Calcium: 9.2 mg/dL (ref 8.4–10.5)
Chloride: 108 mEq/L (ref 96–112)
Creatinine, Ser: 1.2 mg/dL (ref 0.4–1.5)
GFR: 62.36 mL/min (ref >60.00)
Glucose, Bld: 94 mg/dL (ref 70–99)
POTASSIUM: 4.5 meq/L (ref 3.5–5.1)
SODIUM: 142 meq/L (ref 135–145)

## 2014-07-12 NOTE — Assessment & Plan Note (Signed)
Continue with amlodipine, Lotensin; he discontinue Lasix and potassium. Check a BMP

## 2014-07-12 NOTE — Patient Instructions (Signed)
Get your blood work before you leave   Check the  blood pressure   weekly  Be sure your blood pressure is between  145/85  and 110/65.  if it is consistently higher or lower, let me know     Please come back to the office in4 to 6 months  for a routine check up

## 2014-07-12 NOTE — Assessment & Plan Note (Signed)
On Xarelto  X 4 months for a provoked DVT and PE. From the PE standpoint, he can discontinue Xarelto but will wait until he sees cardiology as he also has paroxysmal atrial fibrillation.

## 2014-07-12 NOTE — Progress Notes (Signed)
Subjective:    Patient ID: Dustin Ashley, male    DOB: 11-26-1940, 73 y.o.   MRN: [id number]  DOS:  07/12/2014 Type of visit - description : rov  Interval history:  In general feeling well. Medication list reviewed, good compliance, he already discontinue Lasix and potassium. Ambulatory BPs off Lasix around 140 when checked. Labs reviewed, due for a BMP  ROS Denies chest pain, difficulty breathing. Occasionally has edema at the end of the day around the ankles. No nausea, vomiting, blood in the stools or abdominal pain. Since he had his gallbladder out has loose stools after the first meal of the day, overall that problem is decreasing  Past Medical History  Diagnosis Date  . HOH (hard of hearing)     has a hearing aid L, sees audiology rountinely  . Allergic rhinitis   . CAD (coronary artery disease)   . GERD (gastroesophageal reflux disease)   . Hyperlipemia   . Hypertension   . Hyperglycemia     A1C 5.8 04-2010  . Pain, joint, multiple sites     uses valium, occ uses for insomnia. uses for shoulder  pain  . Chronic renal insufficiency, stage II (mild)     Cr ~ 1.4, u/s 4-12 normal kidneys  . 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 Personal history of colonic polyps   . Pulmonary emboli     02-2014  . Paroxysmal atrial fibrillation     Past Surgical History  Procedure Laterality Date  . Coronary artery bypass graft  1995  . Colonoscopy  multiple  . Cholecystectomy N/A 02/15/2014    Procedure: LAPAROSCOPIC CHOLECYSTECTOMY with IOC;  Surgeon: Gayland Curry, MD;  Location: [place];  Service: General;  Laterality: N/A;  . Esophagogastroduodenoscopy N/A 03/02/2014    Procedure: ESOPHAGOGASTRODUODENOSCOPY (EGD);  Surgeon: Ladene Artist, MD;  Location: [facility] ENDOSCOPY;  Service: Endoscopy;  Laterality: N/A;  sarah/leone    History   Social History  . Marital Status: Married    Spouse Name: N/A    Number of Children: 1  . Years of Education: N/A   Occupational History    . retired, delivery truck Geophysicist/field seismologist    Social History Main Topics  . Smoking status: Former Smoker    Quit date: 03/13/1976  . Smokeless tobacco: Never Used  . Alcohol Use: No  . Drug Use: No  . Sexual Activity: Not on file   Other Topics Concern  . Not on file   Social History Narrative   Born in [place], has a large extended family, oldest of several brothers-sister             Medication List       This list is accurate as of: 07/12/14  7:09 PM.  Always use your most recent med list.               acetaminophen 325 MG tablet  Commonly known as:  TYLENOL  Take 2 tablets (650 mg total) by mouth every 6 (six) hours as needed for mild pain, moderate pain, fever or headache.     amLODipine 10 MG tablet  Commonly known as:  NORVASC  TAKE 1 TABLET BY MOUTH DAILY     atenolol 50 MG tablet  Commonly known as:  TENORMIN  Take 75 mg by mouth daily.     atorvastatin 20 MG tablet  Commonly known as:  LIPITOR  TAKE 1 TABLET BY MOUTH DAILY     benazepril 20 MG  tablet  Commonly known as:  LOTENSIN  TAKE 1 TABLET BY MOUTH TWICE A DAY     diazepam 10 MG tablet  Commonly known as:  VALIUM  Take 1 tablet (10 mg total) by mouth every 12 (twelve) hours as needed for anxiety.     esomeprazole 40 MG capsule  Commonly known as:  NEXIUM  Take 1 capsule (40 mg total) by mouth daily.     fexofenadine 180 MG tablet  Commonly known as:  ALLEGRA  Take 180 mg by mouth daily.     niacin 500 MG CR tablet  Commonly known as:  NIASPAN  Take 500 mg by mouth 2 (two) times daily. AT BEDTIME     polyethylene glycol packet  Commonly known as:  MIRALAX / GLYCOLAX  Take 17 g by mouth once as needed.     rivaroxaban 20 MG Tabs tablet  Commonly known as:  XARELTO  Take 1 tablet (20 mg total) by mouth daily with supper.           Objective:   Physical Exam BP 133/75 mmHg  Pulse 53  Temp(Src) 97.9 F (36.6 C) (Oral)  Ht 5\' 9"  (1.753 m)  Wt 189 lb 6 oz (85.9 kg)  BMI 27.95  kg/m2  SpO2 95% General -- alert, well-developed, NAD.  Lungs -- normal respiratory effort, no intercostal retractions, no accessory muscle use, and normal breath sounds.  Heart-- normal rate, regular rhythm, no murmur.  Abdomen-- Not distended, good bowel sounds,soft, non-tender.  Extremities-- trace peri-ankle  edema bilaterally  Neurologic--  alert & oriented X3. Speech normal, gait appropriate for age, strength symmetric and appropriate for age.  Psych-- Cognition and judgment appear intact. Cooperative with normal attention span and concentration. No anxious or depressed appearing.        Assessment & Plan:

## 2014-07-12 NOTE — Progress Notes (Signed)
Pre visit review using our clinic review tool, if applicable. No additional management support is needed unless otherwise documented below in the visit note. 

## 2014-07-12 NOTE — Assessment & Plan Note (Signed)
Paroxysmal atrial fibrillation, on Xarelto. asx. Pt will see cardiology soon and I will let them determine if he needs to stay on Xarelto long-term.

## 2014-07-14 ENCOUNTER — Encounter: Payer: Self-pay | Admitting: Cardiovascular Disease

## 2014-07-14 ENCOUNTER — Ambulatory Visit (INDEPENDENT_AMBULATORY_CARE_PROVIDER_SITE_OTHER): Payer: Medicare Other | Admitting: Cardiovascular Disease

## 2014-07-14 VITALS — BP 132/62 | HR 55 | Ht 69.0 in | Wt 188.8 lb

## 2014-07-14 DIAGNOSIS — I701 Atherosclerosis of renal artery: Secondary | ICD-10-CM

## 2014-07-14 DIAGNOSIS — I4891 Unspecified atrial fibrillation: Secondary | ICD-10-CM

## 2014-07-14 NOTE — Progress Notes (Signed)
Background: The patient is followed for coronary artery disease status post CABG and bilateral renal artery stenosis. The patient has a long history of cardiac disease dating back 30 years. He was first diagnosed with coronary artery disease around age 73. He tells me that medical therapy was advised because he was "too young for heart surgery." He was managed for about 10 years, but developed severe angina with minimal activity. He was taken urgently for four-vessel bypass surgery in 1992. He has had no anginal symptoms since that time.  HPI:  73 year-old male presenting for follow-up evaluation. The patient had a fairly complicated hospitalization this summer when he developed epigastric pain. He was ultimately diagnosed with gallbladder disease and was treated with cholecystectomy. His initial hospitalization was uncomplicated, but he presented about a week later with fever and shortness of breath as well as abdominal pain. He was diagnosed with acute pulmonary emboli. During that hospitalization he was monitored on telemetry and he was incidentally noted to have episodes of atrial fibrillation. He has been treated with Xarelto for his PE as well as for thromboembolic prophylaxis in the setting of atrial fibrillation. Of note, his thrombus burden was small at the time of his pulmonary embolus.  The patient is now doing well. He has some mild leg swelling, but no other specific cardiac related complaints. He denies chest pain, chest pressure, shortness of breath, orthopnea, PND, or heart palpitations.  Studies:  Renal arterial duplex: Peak velocity on the right is 487 cm/s, right kidney 11.1 cm in length. Peak velocity on the left is 484 cm/s, left kidney 10.5 cm in length  Outpatient Encounter Prescriptions as of 07/14/2014  Medication Sig  . acetaminophen (TYLENOL) 325 MG tablet Take 2 tablets (650 mg total) by mouth every 6 (six) hours as needed for mild pain, moderate pain, fever or headache.    Marland Kitchen amLODipine (NORVASC) 10 MG tablet TAKE 1 TABLET BY MOUTH DAILY  . atenolol (TENORMIN) 50 MG tablet Take 75 mg by mouth daily.  Marland Kitchen atorvastatin (LIPITOR) 20 MG tablet TAKE 1 TABLET BY MOUTH DAILY  . benazepril (LOTENSIN) 20 MG tablet TAKE 1 TABLET BY MOUTH TWICE A DAY  . diazepam (VALIUM) 10 MG tablet Take 1 tablet (10 mg total) by mouth every 12 (twelve) hours as needed for anxiety.  Marland Kitchen esomeprazole (NEXIUM) 40 MG capsule Take 1 capsule (40 mg total) by mouth daily.  . fexofenadine (ALLEGRA) 180 MG tablet Take 180 mg by mouth daily.    . niacin (NIASPAN) 500 MG CR tablet Take 500 mg by mouth 2 (two) times daily. AT BEDTIME  . polyethylene glycol (MIRALAX / GLYCOLAX) packet Take 17 g by mouth once as needed.  . rivaroxaban (XARELTO) 20 MG TABS tablet Take 1 tablet (20 mg total) by mouth daily with supper.    Allergies  Allergen Reactions  . Hydrocodone-Acetaminophen Other (See Comments)    REACTION: bladder obstruction  . Tramadol Hcl Other (See Comments)    REACTION: bladder obstruction    Past Medical History  Diagnosis Date  . HOH (hard of hearing)     has a hearing aid L, sees audiology rountinely  . Allergic rhinitis   . CAD (coronary artery disease)   . GERD (gastroesophageal reflux disease)   . Hyperlipemia   . Hypertension   . Hyperglycemia     A1C 5.8 04-2010  . Pain, joint, multiple sites     uses valium, occ uses for insomnia. uses for shoulder  pain  .  Chronic renal insufficiency, stage II (mild)     Cr ~ 1.4, u/s 4-12 normal kidneys  . 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 Personal history of colonic polyps   . Pulmonary emboli     02-2014  . Paroxysmal atrial fibrillation     family history includes Heart attack in his brother; Hypertension in his father. There is no history of Cancer or Diabetes.   ROS: Negative except as per HPI  BP 132/62 mmHg  Pulse 55  Ht 5\' 9"  (1.753 m)  Wt 188 lb 12.8 oz (85.639 kg)  BMI 27.87 kg/m2  PHYSICAL EXAM: Pt is  alert and oriented, NAD HEENT: normal Neck: JVP - normal, carotids 2+= without bruits Lungs: CTA bilaterally CV: RRR without murmur or gallop Abd: soft, NT, Positive BS, no hepatomegaly Ext: no C/C/E, distal pulses intact and equal Skin: warm/dry no rash  ASSESSMENT AND PLAN: 1. CAD s/p CABG: stable without angina. Medical program reviewed and appropriate.  2. Renal atherosclerosis. BP controlled, renal function normal, and renal size within normal limits. Continue annual follow-up and duplex imaging. Reviewed findings of recent study with the patient and his wife today.  3. HTN - controlled on med Rx.   4. Hyperlipidemia: on combination of niacin and atorvastatin. Lipid Panel     Component Value Date/Time   CHOL 123 02/12/2014 0555   TRIG 126 02/12/2014 0555   HDL 40 02/12/2014 0555   CHOLHDL 3.1 02/12/2014 0555   VLDL 25 02/12/2014 0555   LDLCALC 58 02/12/2014 0555   5. Paroxysmal atrial fibrillation. Unclear whether this is an ongoing issue or whether this was related to his acute illness during his hospitalization earlier this year. As he otherwise would be about ready to discontinue Xarelto, I think it is important that we document whether he is having any atrial fibrillation. Will plan on a 3 week event monitor since he was asymptomatic at the time of his atrial fib in the past. If he is maintaining sinus rhythm, will plan on discontinuing Xarelto in the near future.  Sherren Mocha, MD 07/14/2014 4:28 PM

## 2014-07-14 NOTE — Patient Instructions (Signed)
Your physician has recommended that you wear a 21 day event monitor. Event monitors are medical devices that record the heart's electrical activity. Doctors most often [place] these monitors to diagnose arrhythmias. Arrhythmias are problems with the speed or rhythm of the heartbeat. The monitor is a small, portable device. You can wear one while you do your normal daily activities. This is usually used to diagnose what is causing palpitations/syncope (passing out).  Your physician wants you to follow-up in: 1 year with Dr. Burt Knack. You will receive a reminder letter in the mail two months in advance. If you don't receive a letter, please call our office to schedule the follow-up appointment.  Your physician has requested that you have a renal artery duplex in 1 year. During this test, an ultrasound is used to evaluate blood flow to the kidneys. Allow one hour for this exam. Do not eat after midnight the day before and avoid carbonated beverages. Take your medications as you usually do.

## 2014-07-18 ENCOUNTER — Encounter: Payer: Self-pay | Admitting: *Deleted

## 2014-07-18 ENCOUNTER — Encounter (INDEPENDENT_AMBULATORY_CARE_PROVIDER_SITE_OTHER): Payer: Medicare Other

## 2014-07-18 DIAGNOSIS — I4891 Unspecified atrial fibrillation: Secondary | ICD-10-CM

## 2014-07-18 NOTE — Progress Notes (Signed)
Patient ID: Dustin Ashley, male   DOB: 1940-09-24, 74 y.o.   MRN: [id number] [employer] 21 day cardiac event monitor applied to patient.

## 2014-07-19 DIAGNOSIS — I4891 Unspecified atrial fibrillation: Secondary | ICD-10-CM | POA: Diagnosis not present

## 2014-08-13 ENCOUNTER — Other Ambulatory Visit: Payer: Self-pay | Admitting: Internal Medicine

## 2014-08-18 ENCOUNTER — Encounter: Payer: Self-pay | Admitting: Cardiovascular Disease

## 2014-08-18 ENCOUNTER — Telehealth: Payer: Self-pay | Admitting: Internal Medicine

## 2014-08-18 NOTE — Telephone Encounter (Signed)
Note from cardiology reviewed, no cardiac indication for xarelto,  Anticoagulated for > than  5 months for a provoked DVT and PE. Please call the patient:  Okay to discontinue Xarelto Restart aspirin 81 mg daily

## 2014-08-18 NOTE — Progress Notes (Signed)
I spoke with the pt's wife and made her aware of monitor results. Dr Ethel Rana office has already contacted her to discontinue Xarelto and start Aspirin 81mg  daily.

## 2014-08-18 NOTE — Telephone Encounter (Signed)
Xarelto 20 mg d/c on Pts chart.

## 2014-08-18 NOTE — Telephone Encounter (Signed)
Spoke with Katharine Look, [place] wife, informed her we received cardio notes and per cardio and Dr. Larose Kells, [place] may d/c Xarelto. Informed her to have Pt take enteric coated Aspirin 81 mg daily. Katharine Look verbalized understanding.

## 2014-08-18 NOTE — Progress Notes (Signed)
Reviewed event monitor. There is no atrial fibrillation. I will check with Dr Larose Kells to see if patient can stop Xarelto. There is no cardiac indication for continuing oral anticoagulation.  Sherren Mocha 08/18/2014 12:26 PM

## 2014-08-18 NOTE — Telephone Encounter (Signed)
LMOM for Pt to return call.  

## 2014-09-01 ENCOUNTER — Other Ambulatory Visit: Payer: Self-pay | Admitting: Internal Medicine

## 2014-09-07 ENCOUNTER — Other Ambulatory Visit: Payer: Self-pay | Admitting: Dermatology

## 2014-09-07 DIAGNOSIS — D485 Neoplasm of uncertain behavior of skin: Secondary | ICD-10-CM | POA: Diagnosis not present

## 2014-09-07 DIAGNOSIS — L814 Other melanin hyperpigmentation: Secondary | ICD-10-CM | POA: Diagnosis not present

## 2014-09-07 DIAGNOSIS — L570 Actinic keratosis: Secondary | ICD-10-CM | POA: Diagnosis not present

## 2014-10-10 ENCOUNTER — Other Ambulatory Visit: Payer: Self-pay | Admitting: Internal Medicine

## 2014-10-19 ENCOUNTER — Other Ambulatory Visit: Payer: Self-pay

## 2014-11-23 ENCOUNTER — Encounter: Payer: Self-pay | Admitting: Internal Medicine

## 2014-12-01 ENCOUNTER — Other Ambulatory Visit: Payer: Self-pay | Admitting: Internal Medicine

## 2014-12-01 NOTE — Telephone Encounter (Signed)
Okay #60 and 3 refills 

## 2014-12-01 NOTE — Telephone Encounter (Signed)
Rx printed, awaiting MD signature.  

## 2014-12-01 NOTE — Telephone Encounter (Signed)
Pt is requesting refill on Diazepam.  Last OV: 07/12/2014, appt scheduled for 12/13/2014 at 1045 Last Fill: 04/19/2014 #60 3RF UDS: 04/08/2013 Low risk  DUE FOR UDS AT NEXT OV  Please advise.

## 2014-12-01 NOTE — Telephone Encounter (Signed)
Rx faxed to [facility].

## 2014-12-13 ENCOUNTER — Encounter: Payer: Self-pay | Admitting: Internal Medicine

## 2014-12-13 ENCOUNTER — Ambulatory Visit (INDEPENDENT_AMBULATORY_CARE_PROVIDER_SITE_OTHER): Payer: Medicare Other | Admitting: Internal Medicine

## 2014-12-13 VITALS — BP 126/78 | HR 54 | Temp 98.0°F | Ht 69.0 in | Wt 190.2 lb

## 2014-12-13 DIAGNOSIS — N529 Male erectile dysfunction, unspecified: Secondary | ICD-10-CM | POA: Diagnosis not present

## 2014-12-13 DIAGNOSIS — I10 Essential (primary) hypertension: Secondary | ICD-10-CM | POA: Diagnosis not present

## 2014-12-13 DIAGNOSIS — G4700 Insomnia, unspecified: Secondary | ICD-10-CM | POA: Diagnosis not present

## 2014-12-13 DIAGNOSIS — I480 Paroxysmal atrial fibrillation: Secondary | ICD-10-CM

## 2014-12-13 DIAGNOSIS — R7303 Prediabetes: Secondary | ICD-10-CM

## 2014-12-13 DIAGNOSIS — F528 Other sexual dysfunction not due to a substance or known physiological condition: Secondary | ICD-10-CM

## 2014-12-13 DIAGNOSIS — R7309 Other abnormal glucose: Secondary | ICD-10-CM | POA: Diagnosis not present

## 2014-12-13 DIAGNOSIS — I2699 Other pulmonary embolism without acute cor pulmonale: Secondary | ICD-10-CM

## 2014-12-13 LAB — BASIC METABOLIC PANEL
BUN: 23 mg/dL (ref 6–23)
CO2: 27 mEq/L (ref 19–32)
CREATININE: 1.27 mg/dL (ref 0.40–1.50)
Calcium: 9.4 mg/dL (ref 8.4–10.5)
Chloride: 105 mEq/L (ref 96–112)
GFR: 58.9 mL/min — ABNORMAL LOW (ref >60.00)
GLUCOSE: 101 mg/dL — AB (ref 70–99)
POTASSIUM: 4 meq/L (ref 3.5–5.1)
SODIUM: 139 meq/L (ref 135–145)

## 2014-12-13 LAB — HEMOGLOBIN A1C: Hgb A1c MFr Bld: 5.3 % (ref 4.6–6.5)

## 2014-12-13 MED ORDER — AMLODIPINE BESYLATE 10 MG PO TABS: 10.0000 mg | ORAL_TABLET | Freq: Every day | ORAL | Status: DC

## 2014-12-13 MED ORDER — BENAZEPRIL HCL 20 MG PO TABS: 20.0000 mg | ORAL_TABLET | Freq: Two times a day (BID) | ORAL | Status: DC

## 2014-12-13 MED ORDER — ATORVASTATIN CALCIUM 20 MG PO TABS: 20.0000 mg | ORAL_TABLET | Freq: Every day | ORAL | Status: DC

## 2014-12-13 MED ORDER — TADALAFIL 20 MG PO TABS: 10.0000 mg | ORAL_TABLET | ORAL | Status: DC | PRN

## 2014-12-13 NOTE — Assessment & Plan Note (Signed)
History of atrial fibrillation, recently saw cardiology, after further testing they  discontinue Xarelto. Patient is asymptomatic

## 2014-12-13 NOTE — Progress Notes (Signed)
Pre visit review using our clinic review tool, if applicable. No additional management support is needed unless otherwise documented below in the visit note. 

## 2014-12-13 NOTE — Assessment & Plan Note (Signed)
Erectile dysfunction, previously failed Viagra, patient quite interested in checking his testosterone level. Plan: check testosterone, further advice would result. Try Cialis.

## 2014-12-13 NOTE — Patient Instructions (Addendum)
Get your blood work before you leave    Try Cialis 20 mg: Half or one tablet every other day.[place]

## 2014-12-13 NOTE — Assessment & Plan Note (Signed)
Insomnia, on diazepam daily at bedtime, check a UDS.

## 2014-12-13 NOTE — Progress Notes (Signed)
Subjective:    Patient ID: Dustin Ashley, male    DOB: 27-Sep-1940, 74 y.o.   MRN: [id number]  DOS:  12/13/2014 Type of visit - description : rov Interval history: History of atrial fibrillation, now off xarelto History of PE, now off anticoagulation  Request a check of testosterone, reports persistent ED, has also noted some degree of low energy. Libido is okay. Hypertension, good compliance of medication, due for a BMP   Review of Systems Denies chest pain, difficulty breathing, lower extremity edema or palpitations No nausea, vomiting, diarrhea No anxiety depression  Past Medical History  Diagnosis Date  . HOH (hard of hearing)     has a hearing aid L, sees audiology rountinely  . Allergic rhinitis   . CAD (coronary artery disease)   . GERD (gastroesophageal reflux disease)   . Hyperlipemia   . Hypertension   . Hyperglycemia     A1C 5.8 04-2010  . Pain, joint, multiple sites     uses valium, occ uses for insomnia. uses for shoulder  pain  . Chronic renal insufficiency, stage II (mild)     Cr ~ 1.4, u/s 4-12 normal kidneys  . 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 Personal history of colonic polyps   . Pulmonary emboli     02-2014  . Paroxysmal atrial fibrillation     Past Surgical History  Procedure Laterality Date  . Coronary artery bypass graft  1995  . Colonoscopy  multiple  . Cholecystectomy N/A 02/15/2014    Procedure: LAPAROSCOPIC CHOLECYSTECTOMY with IOC;  Surgeon: Gayland Curry, MD;  Location: [facility];  Service: General;  Laterality: N/A;  . Esophagogastroduodenoscopy N/A 03/02/2014    Procedure: ESOPHAGOGASTRODUODENOSCOPY (EGD);  Surgeon: Ladene Artist, MD;  Location: [facility] ENDOSCOPY;  Service: Endoscopy;  Laterality: N/A;  sarah/leone    History   Social History  . Marital Status: Married    Spouse Name: N/A  . Number of Children: 1  . Years of Education: N/A   Occupational History  . retired, delivery truck Geophysicist/field seismologist    Social History Main Topics    . Smoking status: Former Smoker    Quit date: 03/13/1976  . Smokeless tobacco: Never Used  . Alcohol Use: No  . Drug Use: No  . Sexual Activity: Not on file   Other Topics Concern  . Not on file   Social History Narrative   Born in [place], has a large extended family, oldest of several brothers-sister             Medication List       This list is accurate as of: 12/13/14  6:11 PM.  Always use your most recent med list.               acetaminophen 325 MG tablet  Commonly known as:  TYLENOL  Take 2 tablets (650 mg total) by mouth every 6 (six) hours as needed for mild pain, moderate pain, fever or headache.     amLODipine 10 MG tablet  Commonly known as:  NORVASC  Take 1 tablet (10 mg total) by mouth daily.     atenolol 50 MG tablet  Commonly known as:  TENORMIN  Take 1.5 tablets (75 mg total) by mouth daily.     atorvastatin 20 MG tablet  Commonly known as:  LIPITOR  Take 1 tablet (20 mg total) by mouth daily.     benazepril 20 MG tablet  Commonly known as:  LOTENSIN  Take  1 tablet (20 mg total) by mouth 2 (two) times daily.     diazepam 10 MG tablet  Commonly known as:  VALIUM  Take 1 tablet (10 mg total) by mouth every 12 (twelve) hours as needed for anxiety.     esomeprazole 40 MG capsule  Commonly known as:  NEXIUM  Take 1 capsule (40 mg total) by mouth daily.     fexofenadine 180 MG tablet  Commonly known as:  ALLEGRA  Take 180 mg by mouth daily.     niacin 500 MG CR tablet  Commonly known as:  NIASPAN  Take 500 mg by mouth 2 (two) times daily. AT BEDTIME     polyethylene glycol packet  Commonly known as:  MIRALAX / GLYCOLAX  Take 17 g by mouth once as needed.     tadalafil 20 MG tablet  Commonly known as:  CIALIS  Take 0.5-1 tablets (10-20 mg total) by mouth every other day as needed for erectile dysfunction.           Objective:   Physical Exam BP 126/78 mmHg  Pulse 54  Temp(Src) 98 F (36.7 C) (Oral)  Ht 5\' 9"  (1.753 m)  Wt  190 lb 4 oz (86.297 kg)  BMI 28.08 kg/m2  SpO2 95% General:   Well developed, well nourished . NAD.  HEENT:  Normocephalic . Face symmetric, atraumatic Lungs:  CTA B Normal respiratory effort, no intercostal retractions, no accessory muscle use. Heart: RRR,  no murmur.  No pretibial edema bilaterally  Skin: Not pale. Not jaundice Neurologic:  alert & oriented X3.  Speech normal, gait appropriate for age and unassisted Psych--  Cognition and judgment appear intact.  Cooperative with normal attention span and concentration.  Behavior appropriate. No anxious or depressed appearing.        Assessment & Plan:     Hypertension, good compliance of medication, check a BMP  Hyperglycemia, check A1c

## 2014-12-13 NOTE — Assessment & Plan Note (Signed)
Now of anticoagulation

## 2014-12-14 LAB — TESTOSTERONE, FREE, TOTAL, SHBG
Sex Hormone Binding: 49 nmol/L (ref 22–77)
TESTOSTERONE: 277 ng/dL — AB (ref 300–890)
Testosterone, Free: 41.8 pg/mL — ABNORMAL LOW (ref 47.0–244.0)
Testosterone-% Free: 1.5 % — ABNORMAL LOW (ref 1.6–2.9)

## 2014-12-19 ENCOUNTER — Encounter: Payer: Self-pay | Admitting: Internal Medicine

## 2015-02-01 ENCOUNTER — Encounter: Payer: Self-pay | Admitting: Gastroenterology

## 2015-02-01 ENCOUNTER — Encounter: Payer: Self-pay | Admitting: Internal Medicine

## 2015-02-24 ENCOUNTER — Ambulatory Visit (AMBULATORY_SURGERY_CENTER): Payer: Self-pay | Admitting: *Deleted

## 2015-02-24 VITALS — Ht 69.0 in | Wt 196.0 lb

## 2015-02-24 DIAGNOSIS — Z860100 Personal history of colon polyps, unspecified: Secondary | ICD-10-CM

## 2015-02-24 NOTE — Progress Notes (Signed)
No egg or soy allergy. No anesthesia problems.  No home O2.  No diet meds.  

## 2015-03-09 ENCOUNTER — Encounter: Payer: Self-pay | Admitting: Internal Medicine

## 2015-03-09 ENCOUNTER — Ambulatory Visit (AMBULATORY_SURGERY_CENTER): Payer: Medicare Other | Admitting: Internal Medicine

## 2015-03-09 VITALS — BP 131/59 | HR 51 | Temp 97.6°F | Resp 33 | Ht 69.0 in | Wt 196.0 lb

## 2015-03-09 DIAGNOSIS — D120 Benign neoplasm of cecum: Secondary | ICD-10-CM

## 2015-03-09 DIAGNOSIS — I10 Essential (primary) hypertension: Secondary | ICD-10-CM | POA: Diagnosis not present

## 2015-03-09 DIAGNOSIS — Z860100 Personal history of colon polyps, unspecified: Secondary | ICD-10-CM

## 2015-03-09 DIAGNOSIS — K635 Polyp of colon: Secondary | ICD-10-CM

## 2015-03-09 DIAGNOSIS — D124 Benign neoplasm of descending colon: Secondary | ICD-10-CM | POA: Diagnosis not present

## 2015-03-09 DIAGNOSIS — D123 Benign neoplasm of transverse colon: Secondary | ICD-10-CM

## 2015-03-09 MED ORDER — SODIUM CHLORIDE 0.9 % IV SOLN: 500.0000 mL | INTRAVENOUS | Status: DC

## 2015-03-09 NOTE — Op Note (Signed)
[place]  [address]. [place], [address]   COLONOSCOPY PROCEDURE REPORT  PATIENT: Dustin Ashley, Dustin Ashley  MR#: [id number] BIRTHDATE: 07-04-1941 , 89  yrs. old GENDER: male ENDOSCOPIST: Gatha Mayer, MD, [facility] PROCEDURE DATE:  03/09/2015 PROCEDURE:   Colonoscopy, surveillance and Colonoscopy with snare polypectomy First Screening Colonoscopy - Avg.  risk and is 50 yrs.  old or older - No.  Prior Negative Screening - Now for repeat screening. N/A  History of Adenoma - Now for follow-up colonoscopy & has been > or = to 3 yrs.  Yes hx of adenoma.  Has been 3 or more years since last colonoscopy.  Polyps removed today? Yes ASA CLASS:   Class III INDICATIONS:Surveillance due to prior colonic neoplasia and PH Colon Adenoma. MEDICATIONS: Propofol 200 mg IV and Monitored anesthesia care  DESCRIPTION OF PROCEDURE:   After the risks benefits and alternatives of the procedure were thoroughly explained, informed consent was obtained.  The digital rectal exam revealed no rectal mass and revealed an enlarged prostate.   The LB CF-H180AL Loaner [id number]  endoscope was introduced through the anus and advanced to the cecum, which was identified by both the appendix and ileocecal valve. No adverse events experienced.   The quality of the prep was good.  (MiraLax was used)  The instrument was then slowly withdrawn as the colon was fully examined. Estimated blood loss is zero unless otherwise noted in this procedure report.   COLON FINDINGS: Four polypoid shaped sessile polyps ranging from 2 to 17mm in size were found at the cecum, in the transverse colon, and descending colon.  Polypectomies were performed with a cold snare.  The resection was complete, the polyp tissue was completely retrieved and sent to histology.   There was diverticulosis noted in the sigmoid colon.   Internal hemorrhoids were found.   The examination was otherwise normal.  Retroflexed views  revealed internal hemorrhoids. The time to cecum = 3.4 Withdrawal time = 16.0   The scope was withdrawn and the procedure completed. COMPLICATIONS: There were no immediate complications.  ENDOSCOPIC IMPRESSION: 1.   Four sessile polyps ranging from 2 to 31mm in size were found at the cecum, in the transverse colon, and descending colon(2); polypectomies were performed with a cold snare 10 mm polyp was in transverse 2.   Diverticulosis was noted in the sigmoid colon 3.   Internal hemorrhoids 4.   Normal colonoscopy otherwise hx large villous adenomas 2004, no polyps 2010  RECOMMENDATIONS: Timing of repeat colonoscopy will be determined by pathology findings.  eSigned:  Gatha Mayer, MD, [facility] 03/09/2015 10:16 AM   cc: The Patient

## 2015-03-09 NOTE — Progress Notes (Signed)
Report to PACU, RN, vss, BBS= Clear.  

## 2015-03-09 NOTE — Progress Notes (Signed)
Called to room to assist during endoscopic procedure.  Patient ID and intended procedure confirmed with present staff. Received instructions for my participation in the procedure from the performing physician.  

## 2015-03-09 NOTE — Patient Instructions (Addendum)
I found and removed 4 small-medium polyps that look benign. You also have a condition called diverticulosis - common and not usually a problem. Please read the handout provided.  I also saw hemorrhoids.  I will let you know pathology results and if/when to have another routine colonoscopy by mail.  I appreciate the opportunity to care for you. Gatha Mayer, MD, FACG  YOU HAD AN ENDOSCOPIC PROCEDURE TODAY AT [facility]:   Refer to the procedure report that was given to you for any specific questions about what was found during the examination.  If the procedure report does not answer your questions, please call your gastroenterologist to clarify.  If you requested that your care partner not be given the details of your procedure findings, then the procedure report has been included in a sealed envelope for you to review at your convenience later.  YOU SHOULD EXPECT: Some feelings of bloating in the abdomen. Passage of more gas than usual.  Walking can help get rid of the air that was put into your GI tract during the procedure and reduce the bloating. If you had a lower endoscopy (such as a colonoscopy or flexible sigmoidoscopy) you may notice spotting of blood in your stool or on the toilet paper. If you underwent a bowel prep for your procedure, you may not have a normal bowel movement for a few days.  Please Note:  You might notice some irritation and congestion in your nose or some drainage.  This is from the oxygen used during your procedure.  There is no need for concern and it should clear up in a day or so.  SYMPTOMS TO REPORT IMMEDIATELY:   Following lower endoscopy (colonoscopy or flexible sigmoidoscopy):  Excessive amounts of blood in the stool  Significant tenderness or worsening of abdominal pains  Swelling of the abdomen that is new, acute  Fever of 100F or higher  For urgent or emergent issues, a gastroenterologist can be reached at any hour by  calling [phone].   DIET: Your first meal following the procedure should be a small meal and then it is ok to progress to your normal diet. Heavy or fried foods are harder to digest and may make you feel nauseous or bloated.  Likewise, meals heavy in dairy and vegetables can increase bloating.  Drink plenty of fluids but you should avoid alcoholic beverages for 24 hours.  ACTIVITY:  You should plan to take it easy for the rest of today and you should NOT DRIVE or use heavy machinery until tomorrow (because of the sedation medicines used during the test).    FOLLOW UP: Our staff will call the number listed on your records the next business day following your procedure to check on you and address any questions or concerns that you may have regarding the information given to you following your procedure. If we do not reach you, we will leave a message.  However, if you are feeling well and you are not experiencing any problems, there is no need to return our call.  We will assume that you have returned to your regular daily activities without incident.  If any biopsies were taken you will be contacted by phone or by letter within the next 1-3 weeks.  Please call us at [phone] if you have not heard about the biopsies in 3 weeks.    SIGNATURES/CONFIDENTIALITY: You and/or your care partner have signed paperwork which will be entered into your electronic  medical record.  These signatures attest to the fact that that the information above on your After Visit Summary has been reviewed and is understood.  Full responsibility of the confidentiality of this discharge information lies with you and/or your care-partner.  Please review diverticulosis, polyp, and hemorrhoid handouts provided.

## 2015-03-10 ENCOUNTER — Telehealth: Payer: Self-pay | Admitting: *Deleted

## 2015-03-10 NOTE — Telephone Encounter (Signed)
  Follow up Call-  Call back number 03/09/2015  Post procedure Call Back phone  #  [phone]  Permission to leave phone message Yes     Patient questions:  Do you have a fever, pain , or abdominal swelling? No. Pain Score  0 *  Have you tolerated food without any problems? Yes.    Have you been able to return to your normal activities? Yes.    Do you have any questions about your discharge instructions: Diet   No. Medications  No. Follow up visit  No.  Do you have questions or concerns about your Care? No.  Actions: * If pain score is 4 or above: No action needed, pain <4.

## 2015-03-16 ENCOUNTER — Encounter: Payer: Self-pay | Admitting: Internal Medicine

## 2015-03-16 DIAGNOSIS — Z86010 Personal history of colonic polyps: Secondary | ICD-10-CM

## 2015-03-16 NOTE — Progress Notes (Signed)
Quick Note:  Adenoma (small) and a 10 mm hyperplastic (serrated lesion) transverse polyp - recall 2019 consider repeat colonoscopy ______

## 2015-04-20 ENCOUNTER — Encounter: Payer: Medicare Other | Admitting: Internal Medicine

## 2015-05-23 DIAGNOSIS — C44622 Squamous cell carcinoma of skin of right upper limb, including shoulder: Secondary | ICD-10-CM | POA: Diagnosis not present

## 2015-05-24 DIAGNOSIS — C44622 Squamous cell carcinoma of skin of right upper limb, including shoulder: Secondary | ICD-10-CM | POA: Diagnosis not present

## 2015-05-26 ENCOUNTER — Ambulatory Visit (INDEPENDENT_AMBULATORY_CARE_PROVIDER_SITE_OTHER): Payer: Medicare Other

## 2015-05-26 ENCOUNTER — Other Ambulatory Visit: Payer: Self-pay | Admitting: Internal Medicine

## 2015-05-26 DIAGNOSIS — Z23 Encounter for immunization: Secondary | ICD-10-CM

## 2015-06-30 ENCOUNTER — Encounter (HOSPITAL_COMMUNITY): Payer: Medicare Other

## 2015-06-30 ENCOUNTER — Ambulatory Visit (HOSPITAL_COMMUNITY)
Admission: RE | Admit: 2015-06-30 | Discharge: 2015-06-30 | Disposition: A | Discharge status: Home / Self Care | Payer: Medicare Other | Source: Ambulatory Visit | Attending: Cardiology | Admitting: Cardiology

## 2015-06-30 DIAGNOSIS — I701 Atherosclerosis of renal artery: Secondary | ICD-10-CM | POA: Diagnosis not present

## 2015-06-30 DIAGNOSIS — N182 Chronic kidney disease, stage 2 (mild): Secondary | ICD-10-CM | POA: Diagnosis not present

## 2015-06-30 DIAGNOSIS — I129 Hypertensive chronic kidney disease with stage 1 through stage 4 chronic kidney disease, or unspecified chronic kidney disease: Secondary | ICD-10-CM | POA: Insufficient documentation

## 2015-06-30 DIAGNOSIS — E785 Hyperlipidemia, unspecified: Secondary | ICD-10-CM | POA: Insufficient documentation

## 2015-06-30 DIAGNOSIS — I774 Celiac artery compression syndrome: Secondary | ICD-10-CM | POA: Diagnosis not present

## 2015-07-13 ENCOUNTER — Encounter: Payer: Self-pay | Admitting: Cardiovascular Disease

## 2015-07-13 ENCOUNTER — Ambulatory Visit (INDEPENDENT_AMBULATORY_CARE_PROVIDER_SITE_OTHER): Payer: Medicare Other | Admitting: Cardiovascular Disease

## 2015-07-13 VITALS — BP 140/64 | Ht 70.0 in | Wt 200.6 lb

## 2015-07-13 DIAGNOSIS — I701 Atherosclerosis of renal artery: Secondary | ICD-10-CM

## 2015-07-13 DIAGNOSIS — I10 Essential (primary) hypertension: Secondary | ICD-10-CM

## 2015-07-13 DIAGNOSIS — D225 Melanocytic nevi of trunk: Secondary | ICD-10-CM | POA: Diagnosis not present

## 2015-07-13 DIAGNOSIS — I2510 Atherosclerotic heart disease of native coronary artery without angina pectoris: Secondary | ICD-10-CM | POA: Diagnosis not present

## 2015-07-13 DIAGNOSIS — Z85828 Personal history of other malignant neoplasm of skin: Secondary | ICD-10-CM | POA: Diagnosis not present

## 2015-07-13 DIAGNOSIS — L821 Other seborrheic keratosis: Secondary | ICD-10-CM | POA: Diagnosis not present

## 2015-07-13 DIAGNOSIS — L570 Actinic keratosis: Secondary | ICD-10-CM | POA: Diagnosis not present

## 2015-07-13 NOTE — Patient Instructions (Signed)
Medication Instructions:  Your physician recommends that you continue on your current medications as directed. Please refer to the Current Medication list given to you today.  Labwork: No new orders.   Testing/Procedures: Your physician has requested that you have a renal artery duplex in 1 YEAR. During this test, an ultrasound is used to evaluate blood flow to the kidneys. Allow one hour for this exam. Do not eat after midnight the day before and avoid carbonated beverages. Take your medications as you usually do.  Follow-Up: Your physician wants you to follow-up in: 1 YEAR with Dr Cooper.  You will receive a reminder letter in the mail two months in advance. If you don't receive a letter, please call our office to schedule the follow-up appointment.   Any Other Special Instructions Will Be Listed Below (If Applicable).     If you need a refill on your cardiac medications before your next appointment, please call your pharmacy.   

## 2015-07-13 NOTE — Progress Notes (Signed)
Cardiology Office Note Date:  07/13/2015   ID:  Dustin Ashley, DOB September 23, 1940, MRN [id number]  PCP:  [name], MD  Cardiologist:  Sherren Mocha, MD    Chief Complaint  Patient presents with  . routine 6 month exam    +LE edema.    denies cp/sob   History of Present Illness: Dustin Ashley is a 74 y.o. male who presents for  Follow-up evaluation.  The patient is followed for coronary artery disease status post CABG and bilateral renal artery stenosis. The patient has a long history of cardiac disease dating back 30 years. He was first diagnosed with coronary artery disease around age 76. He tells me that medical therapy was advised because he was "too young for heart surgery." He was managed for about 10 years, but developed severe angina with minimal activity. He was taken urgently for four-vessel bypass surgery in 1992. He has had no anginal symptoms since that time.   the patient is doing well. He has not had any chest pain or pressure. He does have generalized fatigue. He is not physically active. He denies orthopnea, PND, or shortness of breath. He's had no heart palpitations, lightheadedness, or syncope.   Past Medical History  Diagnosis Date  . HOH (hard of hearing)     has a hearing aid L, sees audiology rountinely  . Allergic rhinitis   . CAD (coronary artery disease)   . GERD (gastroesophageal reflux disease)   . Hyperlipemia   . Hypertension   . Hyperglycemia     A1C 5.8 04-2010  . Pain, joint, multiple sites     uses valium, occ uses for insomnia. uses for shoulder  pain  . Personal history of colonic polyps   . Pulmonary emboli ([place])     02-2014  . Paroxysmal atrial fibrillation ([facility])   . Allergy     seasonal  . Chronic renal insufficiency, stage II (mild)     Cr ~ 1.4, u/s 4-12 normal kidneys  . RAS (renal artery stenosis) ([place]) 05-2012   . 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 Gallstone pancreatitis 2015    Past Surgical History  Procedure Laterality Date  . Coronary  artery bypass graft  1995  . Colonoscopy  multiple  . Esophagogastroduodenoscopy N/A 03/02/2014    Procedure: ESOPHAGOGASTRODUODENOSCOPY (EGD);  Surgeon: Ladene Artist, MD;  Location: [facility];  Service: Endoscopy;  Laterality: N/A;  sarah/leone  . Cholecystectomy N/A 02/15/2014    Procedure: LAPAROSCOPIC CHOLECYSTECTOMY with IOC;  Surgeon: Gayland Curry, MD;  Location: [facility];  Service: General;  Laterality: N/A;  . Cardiac catheterization      Current Outpatient Prescriptions  Medication Sig Dispense Refill  . acetaminophen (TYLENOL) 325 MG tablet Take 2 tablets (650 mg total) by mouth every 6 (six) hours as needed for mild pain, moderate pain, fever or headache.    Marland Kitchen amLODipine (NORVASC) 10 MG tablet Take 1 tablet (10 mg total) by mouth daily. 90 tablet 2  . aspirin 81 MG tablet Take 81 mg by mouth daily.    Marland Kitchen atenolol (TENORMIN) 50 MG tablet Take 1.5 tablets (75 mg total) by mouth daily. 45 tablet 5  . atorvastatin (LIPITOR) 20 MG tablet Take 1 tablet (20 mg total) by mouth daily. 90 tablet 2  . benazepril (LOTENSIN) 20 MG tablet Take 1 tablet (20 mg total) by mouth 2 (two) times daily. 180 tablet 2  . bisacodyl (DULCOLAX) 5 MG EC tablet Take 5 mg by mouth daily as  needed for moderate constipation.    . diazepam (VALIUM) 10 MG tablet Take 1 tablet (10 mg total) by mouth every 12 (twelve) hours as needed for anxiety. 60 tablet 3  . esomeprazole (NEXIUM) 40 MG capsule Take 40 mg by mouth daily as needed (acid reflux or indigestion).    . fexofenadine (ALLEGRA) 180 MG tablet Take 180 mg by mouth daily.      . niacin (NIASPAN) 500 MG CR tablet Take 500 mg by mouth 2 (two) times daily. AT BEDTIME    . polyethylene glycol (MIRALAX / GLYCOLAX) packet Take 17 g by mouth daily as needed for mild constipation.      No current facility-administered medications for this visit.    Allergies:   Hydrocodone-acetaminophen and Tramadol hcl   Social History:  The patient  reports that he quit  smoking about 39 years ago. He has never used smokeless tobacco. He reports that he does not drink alcohol or use illicit drugs.   Family History:  The patient's  family history includes Heart attack in his brother; Hypertension in his father. There is no history of Cancer, Diabetes, or Colon cancer.    ROS:  Please see the history of present illness.  Otherwise, review of systems is positive for  Leg swelling, hearing loss, cough, diarrhea, back pain, easy bruising, snoring, constipation.  All other systems are reviewed and negative.    PHYSICAL EXAM: VS:  BP 140/64 mmHg  Ht 5\' 10"  (1.778 m)  Wt 200 lb 9.6 oz (90.992 kg)  BMI 28.78 kg/m2 , BMI Body mass index is 28.78 kg/(m^2). GEN: Well nourished, well developed, in no acute distress HEENT: normal Neck: no JVD, no masses. No carotid bruits Cardiac: bradycardic and regular without murmur or gallop    Respiratory:  clear to auscultation bilaterally, normal work of breathing GI: soft, nontender, nondistended, + BS MS: no deformity or atrophy Ext: no pretibial edema, pedal pulses 2+= bilaterally Skin: warm and dry, no rash Neuro:  Strength and sensation are intact Psych: euthymic mood, full affect  EKG:  EKG is ordered today. The ekg ordered today shows  Sinus bradycardia with first-degree AV block, otherwise within normal  Recent Labs: 12/13/2014: BUN 23; Creatinine, Ser 1.27; Potassium 4.0; Sodium 139   Lipid Panel     Component Value Date/Time   CHOL 123 02/12/2014 0555   TRIG 126 02/12/2014 0555   TRIG 92 06/26/2006 1042   HDL 40 02/12/2014 0555   CHOLHDL 3.1 02/12/2014 0555   CHOLHDL 2.8 CALC 06/26/2006 1042   VLDL 25 02/12/2014 0555   LDLCALC 58 02/12/2014 0555      Wt Readings from Last 3 Encounters:  07/13/15 200 lb 9.6 oz (90.992 kg)  03/09/15 196 lb (88.905 kg)  02/24/15 196 lb (88.905 kg)     Cardiac Studies Reviewed: 2D Echo 03/07/2015: Study Conclusions  - Left ventricle: Abnormal septal motion. The  cavity size was normal. Wall thickness was normal. The estimated ejection fraction was 55%. - Left atrium: The atrium was mildly dilated. - Atrial septum: No defect or patent foramen ovale was identified.  Renal arterial Duplex:  there is severe bilateral renal artery stenosis with a peak systolic velocity of 123XX123 cm/s on the right and 395 7 m/s on the left. Kidney size is normal bilaterally at 11.1 and 10.5 cm on the right and left, respectively. This study is unchanged from previous duplex scans dating back to 2013.  ASSESSMENT AND PLAN: 1.   Bilateral renal artery  stenosis: The patient remained stable. He continues with essentially normal renal function , good blood pressure control, and stable findings on renal arterial duplex. He will continue with medical therapy and repeat his renal arterial duplex next year.  2. Coronary artery disease status post remote CABG, no angina: Medications reviewed without changes.  3. Renovascular hypertension: the patient continues on amlodipine, atenolol, and benazepril with good blood pressure control.   4.  Hyperlipidemia: Treated with atorvastatin  And niacin.  5. Atrial fibrillation, paroxysmal: note the patient had atrial fibrillation at time of an acute illness while he was hospitalized in 2015. He's had no recurrence. He underwent a three-week monitor demonstrating no atrial fibrillation. Anticoagulation has been discontinued.    6. Bilateral calf claudication: minimal limitation as this only occurs with walking have very long distance. Continue with observation and medical therapy.  Current medicines are reviewed with the patient today.  The patient does not have concerns regarding medicines.  Labs/ tests ordered today include:   Orders Placed This Encounter  Procedures  . EKG 12-Lead    Disposition:   FU one year  Signed, Sherren Mocha, MD  07/13/2015 11:42 PM    [facility], [place],  [place]  [address] Phone: [phone]; Fax: [phone]

## 2015-07-14 ENCOUNTER — Encounter (HOSPITAL_COMMUNITY): Payer: Medicare Other

## 2015-07-24 ENCOUNTER — Other Ambulatory Visit: Payer: Self-pay | Admitting: Internal Medicine

## 2015-07-25 NOTE — Telephone Encounter (Signed)
Ok 60 and 3 RF

## 2015-07-25 NOTE — Telephone Encounter (Signed)
Pt is requesting refill on Diazepam.  Last OV: 12/13/2014, CPE on 08/25/2015 Last Fill: 12/01/2014 #60 and 3RF Pt sig: 1 tablet q12h PRN UDS: 07/01/2012 Low risk  Needs UDS and contract at next OV.  Please advise.

## 2015-07-25 NOTE — Telephone Encounter (Signed)
Rx printed, awaiting MD signature.  

## 2015-07-25 NOTE — Telephone Encounter (Signed)
Rx faxed to [facility].  

## 2015-08-07 ENCOUNTER — Telehealth: Payer: Self-pay | Admitting: Internal Medicine

## 2015-08-07 ENCOUNTER — Emergency Department (HOSPITAL_COMMUNITY)
Admission: EM | Admit: 2015-08-07 | Discharge: 2015-08-07 | Discharge status: Against Medical Advice | Payer: Medicare Other | Attending: Emergency Medicine | Admitting: Emergency Medicine

## 2015-08-07 ENCOUNTER — Encounter (HOSPITAL_COMMUNITY): Payer: Self-pay

## 2015-08-07 DIAGNOSIS — I129 Hypertensive chronic kidney disease with stage 1 through stage 4 chronic kidney disease, or unspecified chronic kidney disease: Secondary | ICD-10-CM | POA: Diagnosis not present

## 2015-08-07 DIAGNOSIS — R197 Diarrhea, unspecified: Secondary | ICD-10-CM | POA: Insufficient documentation

## 2015-08-07 DIAGNOSIS — N189 Chronic kidney disease, unspecified: Secondary | ICD-10-CM | POA: Insufficient documentation

## 2015-08-07 DIAGNOSIS — R531 Weakness: Secondary | ICD-10-CM | POA: Diagnosis not present

## 2015-08-07 DIAGNOSIS — I2510 Atherosclerotic heart disease of native coronary artery without angina pectoris: Secondary | ICD-10-CM | POA: Insufficient documentation

## 2015-08-07 DIAGNOSIS — R1110 Vomiting, unspecified: Secondary | ICD-10-CM | POA: Insufficient documentation

## 2015-08-07 LAB — I-STAT CG4 LACTIC ACID, ED: Lactic Acid, Venous: 1 mmol/L (ref 0.5–2.0)

## 2015-08-07 LAB — COMPREHENSIVE METABOLIC PANEL
ALT: 34 U/L (ref 17–63)
ANION GAP: 10 (ref 5–15)
AST: 28 U/L (ref 15–41)
Albumin: 3.6 g/dL (ref 3.5–5.0)
Alkaline Phosphatase: 89 U/L (ref 38–126)
BUN: 27 mg/dL — ABNORMAL HIGH (ref 6–20)
CHLORIDE: 106 mmol/L (ref 101–111)
CO2: 24 mmol/L (ref 22–32)
Calcium: 8.9 mg/dL (ref 8.9–10.3)
Creatinine, Ser: 1.37 mg/dL — ABNORMAL HIGH (ref 0.61–1.24)
GFR, EST AFRICAN AMERICAN: 57 mL/min — AB (ref >60)
GFR, EST NON AFRICAN AMERICAN: 49 mL/min — AB (ref >60)
Glucose, Bld: 122 mg/dL — ABNORMAL HIGH (ref 65–99)
POTASSIUM: 3.9 mmol/L (ref 3.5–5.1)
Sodium: 140 mmol/L (ref 135–145)
Total Bilirubin: 0.7 mg/dL (ref 0.3–1.2)
Total Protein: 6.4 g/dL — ABNORMAL LOW (ref 6.5–8.1)

## 2015-08-07 LAB — CBC
HEMATOCRIT: 44.8 % (ref 39.0–52.0)
Hemoglobin: 15.9 g/dL (ref 13.0–17.0)
MCH: 29.9 pg (ref 26.0–34.0)
MCHC: 35.5 g/dL (ref 30.0–36.0)
MCV: 84.2 fL (ref 78.0–100.0)
PLATELETS: 130 10*3/uL — AB (ref 150–400)
RBC: 5.32 MIL/uL (ref 4.22–5.81)
RDW: 13.5 % (ref 11.5–15.5)
WBC: 7.5 10*3/uL (ref 4.0–10.5)

## 2015-08-07 LAB — LIPASE, BLOOD: LIPASE: 23 U/L (ref 11–51)

## 2015-08-07 NOTE — ED Notes (Addendum)
Patient states he is leaving, will come back if he needs to, patient encouraged to stay

## 2015-08-07 NOTE — Telephone Encounter (Signed)
Wife called stating that patient is having severe diarrhea and vomiting with low grade fever. Transferred to [facility]

## 2015-08-07 NOTE — ED Notes (Signed)
Patient here with vomiting and diarrhea that started early Sunday am, denies abdominal pain, no cramping, complains that he feels weak

## 2015-08-07 NOTE — Telephone Encounter (Signed)
Patient Name: KADARIAN ARIAIL DOB: 10-23-1940 Initial Comment Caller states, husband vomiting and diarrhea for last 2 nights, no fever this morning. Nurse Assessment Nurse: Marcelline Deist, RN, Lynda Date/Time (Eastern Time): 08/07/2015 10:10:40 AM Confirm and document reason for call. If symptomatic, describe symptoms. You must click the next button to save text entered. ---Caller states her husband has been having vomiting and diarrhea for last 2 nights, no fever this morning. Has the patient traveled out of the country within the last 30 days? ---Not Applicable Does the patient have any new or worsening symptoms? ---Yes Will a triage be completed? ---Yes Related visit to physician within the last 2 weeks? ---No Does the PT have any chronic conditions? (i.e. diabetes, asthma, etc.) ---Yes List chronic conditions. ---high BP rx Is this a behavioral health or substance abuse call? ---No Guidelines Guideline Title Affirmed Question Affirmed Notes Vomiting [1] MODERATE vomiting (e.g., 3 - 5 times/ day) AND [2] age > 22 Final Disposition User Go to ED Now (or PCP triage) Marcelline Deist, RN, Lynda Comments Caller will talk to the patient & try to convince him to be seen at the hospital. Advised to call for EMS transport if he is too weak to get in a car to be driven. Caller can be reached on her cell # [phone]. Referrals [facility] - ED Disagree/Comply: Comply

## 2015-08-07 NOTE — Telephone Encounter (Signed)
Per chart, pt went to the ER as advised.   

## 2015-08-08 ENCOUNTER — Telehealth: Payer: Self-pay | Admitting: Internal Medicine

## 2015-08-08 NOTE — Telephone Encounter (Signed)
Wife called stating that she called yesterday, was transferred to [facility] and informed to take patient to ER. States they went to [facility] ER and sat for 6 hours without being seen, so they left. Wanted to see Dr. Larose Kells today but no appt available. Offered to either check another location or transfer her back to [facility] because patient is still having same symptoms (severe diarrhea. She declined both options and stated that she will take patient to Urgent Care.

## 2015-08-11 ENCOUNTER — Ambulatory Visit (INDEPENDENT_AMBULATORY_CARE_PROVIDER_SITE_OTHER): Payer: Medicare Other | Admitting: Internal Medicine

## 2015-08-11 ENCOUNTER — Encounter: Payer: Self-pay | Admitting: Internal Medicine

## 2015-08-11 VITALS — BP 108/72 | HR 58 | Temp 97.6°F | Ht 70.0 in | Wt 198.0 lb

## 2015-08-11 DIAGNOSIS — A084 Viral intestinal infection, unspecified: Secondary | ICD-10-CM

## 2015-08-11 NOTE — Progress Notes (Signed)
Pre visit review using our clinic review tool, if applicable. No additional management support is needed unless otherwise documented below in the visit note. 

## 2015-08-11 NOTE — Progress Notes (Signed)
Subjective:    Patient ID: Dustin Ashley, male    DOB: Mar 24, 1941, 75 y.o.   MRN: [id number]  DOS:  08/11/2015 Type of visit - description : Acute visit Interval history:  Symptoms started 08/06/2015: Nausea, several episodes of vomiting, diarrhea, yellow in color. The next day he went to the ER, labs were done, he left before been seen by MD. Labs reviewed, normal except for mild decreased platelet count.  Since the ER visit, he is trying to drink plenty of fluids, taking Imodium which controlled his symptoms for approximately 24 hours. Yesterday he had 3 or 4 episodes of watery diarrhea , had Imodium again  >> so far today no  Diarrhea  Review of Systems  Nausea and vomiting have subsided. Appetite is improving. At the beginning had mild subjective fever, no chills. No hematemesis, blood in the stools or abdominal pain. No travels outside [facility]. He ate salmon in a restaurant before the symptoms. Wife is unaffected. Not feeling dizzy or fainting  Past Medical History  Diagnosis Date  . HOH (hard of hearing)     has a hearing aid L, sees audiology rountinely  . Allergic rhinitis   . CAD (coronary artery disease)   . GERD (gastroesophageal reflux disease)   . Hyperlipemia   . Hypertension   . Hyperglycemia     A1C 5.8 04-2010  . Pain, joint, multiple sites     uses valium, occ uses for insomnia. uses for shoulder  pain  . Personal history of colonic polyps   . Pulmonary emboli ([place])     02-2014  . Paroxysmal atrial fibrillation ([facility])   . Allergy     seasonal  . Chronic renal insufficiency, stage II (mild)     Cr ~ 1.4, u/s 4-12 normal kidneys  . RAS (renal artery stenosis) ([place]) 05-2012   . 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 Gallstone pancreatitis 2015    Past Surgical History  Procedure Laterality Date  . Coronary artery bypass graft  1995  . Colonoscopy  multiple  . Esophagogastroduodenoscopy N/A 03/02/2014    Procedure: ESOPHAGOGASTRODUODENOSCOPY (EGD);  Surgeon:  Ladene Artist, MD;  [facility] ENDOSCOPY;  Service: Endoscopy;  Laterality: N/A;  sarah/leone  . Cholecystectomy N/A 02/15/2014    Procedure: LAPAROSCOPIC CHOLECYSTECTOMY with IOC;  Surgeon: Gayland Curry, MD;  Location: [place];  Service: General;  Laterality: N/A;  . Cardiac catheterization      Social History   Social History  . Marital Status: Married    Spouse Name: N/A  . Number of Children: 1  . Years of Education: N/A   Occupational History  . retired, delivery truck Geophysicist/field seismologist    Social History Main Topics  . Smoking status: Former Smoker    Quit date: 03/13/1976  . Smokeless tobacco: Never Used  . Alcohol Use: No  . Drug Use: No  . Sexual Activity: Not on file   Other Topics Concern  . Not on file   Social History Narrative   Born in [place], has a large extended family, oldest of several brothers-sister             Medication List       This list is accurate as of: 08/11/15 11:59 PM.  Always use your most recent med list.               acetaminophen 325 MG tablet  Commonly known as:  TYLENOL  Take 2 tablets (650 mg total) by mouth every  6 (six) hours as needed for mild pain, moderate pain, fever or headache.     amLODipine 10 MG tablet  Commonly known as:  NORVASC  Take 1 tablet (10 mg total) by mouth daily.     aspirin 81 MG tablet  Take 81 mg by mouth daily.     atenolol 50 MG tablet  Commonly known as:  TENORMIN  Take 1.5 tablets (75 mg total) by mouth daily.     atorvastatin 20 MG tablet  Commonly known as:  LIPITOR  Take 1 tablet (20 mg total) by mouth daily.     benazepril 20 MG tablet  Commonly known as:  LOTENSIN  Take 1 tablet (20 mg total) by mouth 2 (two) times daily.     bisacodyl 5 MG EC tablet  Commonly known as:  DULCOLAX  Take 5 mg by mouth daily as needed for moderate constipation. Reported on 08/11/2015     diazepam 10 MG tablet  Commonly known as:  VALIUM  Take 1 tablet (10 mg total) by mouth every 12 (twelve) hours  as needed for anxiety.     esomeprazole 40 MG capsule  Commonly known as:  NEXIUM  Take 40 mg by mouth daily as needed (acid reflux or indigestion).     fexofenadine 180 MG tablet  Commonly known as:  ALLEGRA  Take 180 mg by mouth daily.     niacin 500 MG CR tablet  Commonly known as:  NIASPAN  Take 500 mg by mouth 2 (two) times daily. AT BEDTIME     polyethylene glycol packet  Commonly known as:  MIRALAX / GLYCOLAX  Take 17 g by mouth daily as needed for mild constipation. Reported on 08/11/2015           Objective:   Physical Exam BP 108/72 mmHg  Pulse 58  Temp(Src) 97.6 F (36.4 C) (Oral)  Ht 5\' 10"  (1.778 m)  Wt 198 lb (89.812 kg)  BMI 28.41 kg/m2  SpO2 97% General:   Well developed, well nourished . NAD.  HEENT:  Normocephalic . Face symmetric, atraumatic. Oral membranes not dry Lungs:  CTA B Normal respiratory effort, no intercostal retractions, no accessory muscle use. Heart: RRR,  no murmur.  no pretibial edema bilaterally  Abdomen:  Not distended, soft, non-tender. No rebound or rigidity.  Skin: Not pale. Not jaundice Neurologic:  alert & oriented X3.  Speech normal, gait appropriate for age and unassisted Psych--  Cognition and judgment appear intact.  Cooperative with normal attention span and concentration.  Behavior appropriate. No anxious or depressed appearing.    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 Plan: Conservative treatment, see instructions. He  takes ACE inhibitors, recommend to skip them if he  is suspecting dehydration. Plan to recheck a CBC when he comes back in few days for his physical.

## 2015-08-11 NOTE — Patient Instructions (Signed)
Rest  Drink plenty of fluids  Instead of Imodium use Pepto-Bismol as needed  Follow a bland diet until better   If you feel you're getting slightly dehydrated or unable to drink fluids: Skip   2 or 3 doses of benazepril, let me know.  Anytime is severe symptoms, fever, chills.

## 2015-08-24 ENCOUNTER — Encounter: Payer: Self-pay | Admitting: Behavioral Health

## 2015-08-24 ENCOUNTER — Telehealth: Payer: Self-pay | Admitting: Behavioral Health

## 2015-08-24 DIAGNOSIS — Z79899 Other long term (current) drug therapy: Secondary | ICD-10-CM | POA: Diagnosis not present

## 2015-08-24 NOTE — Telephone Encounter (Signed)
Pre-Visit Call completed with patient and chart updated.   Pre-Visit Info documented in Specialty Comments under SnapShot.    

## 2015-08-25 ENCOUNTER — Ambulatory Visit (INDEPENDENT_AMBULATORY_CARE_PROVIDER_SITE_OTHER): Payer: Medicare Other | Admitting: Internal Medicine

## 2015-08-25 ENCOUNTER — Encounter: Payer: Self-pay | Admitting: Internal Medicine

## 2015-08-25 VITALS — BP 118/60 | HR 56 | Temp 98.1°F | Ht 70.0 in | Wt 200.4 lb

## 2015-08-25 DIAGNOSIS — R7989 Other specified abnormal findings of blood chemistry: Secondary | ICD-10-CM

## 2015-08-25 DIAGNOSIS — Z0000 Encounter for general adult medical examination without abnormal findings: Secondary | ICD-10-CM

## 2015-08-25 DIAGNOSIS — E785 Hyperlipidemia, unspecified: Secondary | ICD-10-CM | POA: Diagnosis not present

## 2015-08-25 DIAGNOSIS — I10 Essential (primary) hypertension: Secondary | ICD-10-CM | POA: Diagnosis not present

## 2015-08-25 LAB — LIPID PANEL
CHOL/HDL RATIO: 3
Cholesterol: 94 mg/dL (ref 0–200)
HDL: 29.9 mg/dL — AB (ref >39.00)
LDL Cholesterol: 45 mg/dL (ref 0–99)
NonHDL: 64.58
Triglycerides: 96 mg/dL (ref 0.0–149.0)
VLDL: 19.2 mg/dL (ref 0.0–40.0)

## 2015-08-25 LAB — BASIC METABOLIC PANEL
BUN: 16 mg/dL (ref 6–23)
CALCIUM: 9.2 mg/dL (ref 8.4–10.5)
CO2: 32 meq/L (ref 19–32)
Chloride: 105 mEq/L (ref 96–112)
Creatinine, Ser: 1.28 mg/dL (ref 0.40–1.50)
GFR: 58.26 mL/min — AB (ref >60.00)
GLUCOSE: 111 mg/dL — AB (ref 70–99)
Potassium: 4.5 mEq/L (ref 3.5–5.1)
Sodium: 142 mEq/L (ref 135–145)

## 2015-08-25 LAB — TSH: TSH: 1.79 u[IU]/mL (ref 0.35–4.50)

## 2015-08-25 NOTE — Patient Instructions (Signed)
Get your blood work before you leave     Next visit 6 months    Please see one of our nurses for a Medicare Wellness exam       Fall Prevention and Home Safety Falls cause injuries and can affect all age groups. It is possible to use preventive measures to significantly decrease the likelihood of falls. There are many simple measures which can make your home safer and prevent falls. OUTDOORS  Repair cracks and edges of walkways and driveways.  Remove high doorway thresholds.  Trim shrubbery on the main path into your home.  Have good outside lighting.  Clear walkways of tools, rocks, debris, and clutter.  Check that handrails are not broken and are securely fastened. Both sides of steps should have handrails.  Have leaves, snow, and ice cleared regularly.  Use sand or salt on walkways during winter months.  In the garage, clean up grease or oil spills. BATHROOM  Install night lights.  Install grab bars by the toilet and in the tub and shower.  Use non-skid mats or decals in the tub or shower.  Place a plastic non-slip stool in the shower to sit on, if needed.  Keep floors dry and clean up all water on the floor immediately.  Remove soap buildup in the tub or shower on a regular basis.  Secure bath mats with non-slip, double-sided rug tape.  Remove throw rugs and tripping hazards from the floors. BEDROOMS  Install night lights.  Make sure a bedside light is easy to reach.  Do not use oversized bedding.  Keep a telephone by your bedside.  Have a firm chair with side arms to use for getting dressed.  Remove throw rugs and tripping hazards from the floor. KITCHEN  Keep handles on pots and pans turned toward the center of the stove. Use back burners when possible.  Clean up spills quickly and allow time for drying.  Avoid walking on wet floors.  Avoid hot utensils and knives.  Position shelves so they are not too high or low.  Place commonly used  objects within easy reach.  If necessary, use a sturdy step stool with a grab bar when reaching.  Keep electrical cables out of the way.  Do not use floor polish or wax that makes floors slippery. If you must use wax, use non-skid floor wax.  Remove throw rugs and tripping hazards from the floor. STAIRWAYS  Never leave objects on stairs.  Place handrails on both sides of stairways and use them. Fix any loose handrails. Make sure handrails on both sides of the stairways are as long as the stairs.  Check carpeting to make sure it is firmly attached along stairs. Make repairs to worn or loose carpet promptly.  Avoid placing throw rugs at the top or bottom of stairways, or properly secure the rug with carpet tape to prevent slippage. Get rid of throw rugs, if possible.  Have an electrician put in a light switch at the top and bottom of the stairs. OTHER FALL PREVENTION TIPS  Wear low-heel or rubber-soled shoes that are supportive and fit well. Wear closed toe shoes.  When using a stepladder, make sure it is fully opened and both spreaders are firmly locked. Do not climb a closed stepladder.  Add color or contrast paint or tape to grab bars and handrails in your home. Place contrasting color strips on first and last steps.  Learn and use mobility aids as needed. Install an Dealer emergency  response system.  Turn on lights to avoid dark areas. Replace light bulbs that burn out immediately. Get light switches that glow.  Arrange furniture to create clear pathways. Keep furniture in the same place.  Firmly attach carpet with non-skid or double-sided tape.  Eliminate uneven floor surfaces.  Select a carpet pattern that does not visually hide the edge of steps.  Be aware of all pets. OTHER HOME SAFETY TIPS  Set the water temperature for 120 F (48.8 C).  Keep emergency numbers on or near the telephone.  Keep smoke detectors on every level of the home and near sleeping  areas. Document Released: 06/21/2002 Document Revised: 12/31/2011 Document Reviewed: 09/20/2011 [facility] Patient Information 2015 [place], [place]. This information is not intended to replace advice given to you by your health care provider. Make sure you discuss any questions you have with your health care provider.   Preventive Care for Adults Ages 110 and over  Blood pressure check.** / Every 1 to 2 years.  Lipid and cholesterol check.**/ Every 5 years beginning at age 63.  Lung cancer screening. / Every year if you are aged 45-80 years and have a 30-pack-year history of smoking and currently smoke or have quit within the past 15 years. Yearly screening is stopped once you have quit smoking for at least 15 years or develop a health problem that would prevent you from having lung cancer treatment.  Fecal occult blood test (FOBT) of stool. / Every year beginning at age 81 and continuing until age 71. You may not have to do this test if you get a colonoscopy every 10 years.  Flexible sigmoidoscopy** or colonoscopy.** / Every 5 years for a flexible sigmoidoscopy or every 10 years for a colonoscopy beginning at age 36 and continuing until age 38.  Hepatitis C blood test.** / For all people born from [birth year] through 1965 and any individual with known risks for hepatitis C.  Abdominal aortic aneurysm (AAA) screening.** / A one-time screening for ages 58 to 58 years who are current or former smokers.  Skin self-exam. / Monthly.  Influenza vaccine. / Every year.  Tetanus, diphtheria, and acellular pertussis (Tdap/Td) vaccine.** / 1 dose of Td every 10 years.  Varicella vaccine.** / Consult your health care provider.  Zoster vaccine.** / 1 dose for adults aged 76 years or older.  Pneumococcal 13-valent conjugate (PCV13) vaccine.** / Consult your health care provider.  Pneumococcal polysaccharide (PPSV23) vaccine.** / 1 dose for all adults aged 11 years and older.  Meningococcal vaccine.** /  Consult your health care provider.  Hepatitis A vaccine.** / Consult your health care provider.  Hepatitis B vaccine.** / Consult your health care provider.  Haemophilus influenzae type b (Hib) vaccine.** / Consult your health care provider. **Family history and personal history of risk and conditions may change your health care provider's recommendations. Document Released: 08/27/2001 Document Revised: 07/06/2013 Document Reviewed: 11/26/2010 [facility] Patient Information 2015 [place], [place]. This information is not intended to replace advice given to you by your health care provider. Make sure you discuss any questions you have with your health care provider.

## 2015-08-25 NOTE — Progress Notes (Signed)
Subjective:    Patient ID: Dustin Ashley, male    DOB: 03-Oct-1940, 75 y.o.   MRN: [id number]  DOS:  08/25/2015 Type of visit - description : CPX Interval history: CAD: Last note from cardiology reviewed. Stable HTN: Good compliance of medication, ambulatory BPs always less than 130 Dyslipidemia: On statins and niacin, good compliance and no side effects Insomnia, neck pain: Uses Valium as needed, needs to sign a contract and a UDS.    Review of Systems  Constitutional: No fever. No chills. No unexplained wt changes. No unusual sweats  HEENT: No dental problems, no ear discharge, no facial swelling, no voice changes. No eye discharge, no eye  redness , no  intolerance to light   Respiratory: No wheezing , no  difficulty breathing. No cough , no mucus production  Cardiovascular: No CP, no leg swelling , no  Palpitations  GI: no nausea, no vomiting, no diarrhea , no  abdominal pain.  No blood in the stools. No dysphagia, no odynophagia    Endocrine: No polyphagia, no polyuria , no polydipsia  GU: No dysuria, gross hematuria, difficulty urinating. No urinary urgency, no frequency.  Musculoskeletal: No joint swellings or unusual aches or pains  Skin: No change in the color of the skin, palor , no  Rash  Allergic, immunologic: No environmental allergies , no  food allergies  Neurological: No dizziness no  syncope. No headaches. No diplopia, no slurred, no slurred speech, no motor deficits, no facial  Numbness  Hematological: No enlarged lymph nodes, no easy bruising , no unusual bleedings  Psychiatry: No suicidal ideas, no hallucinations, no beavior problems, no confusion.  No unusual/severe anxiety, no depression  Past Medical History  Diagnosis Date  . HOH (hard of hearing)     has a hearing aid L, sees audiology rountinely  . Allergic rhinitis   . CAD (coronary artery disease)   . GERD (gastroesophageal reflux disease)   . Hyperlipemia   . Hypertension   .  Hyperglycemia     A1C 5.8 04-2010  . Pain, joint, multiple sites     uses valium, occ uses for insomnia. uses for shoulder  pain  . Personal history of colonic polyps   . Pulmonary emboli ([place])     02-2014  . Paroxysmal atrial fibrillation (HCC)   . Allergy     seasonal  . Chronic renal insufficiency, stage II (mild)     Cr ~ 1.4, u/s 4-12 normal kidneys  . RAS (renal artery stenosis) ([place]) 05-2012   . 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 Gallstone pancreatitis 2015    Past Surgical History  Procedure Laterality Date  . Coronary artery bypass graft  1995  . Colonoscopy  multiple  . Esophagogastroduodenoscopy N/A 03/02/2014    Procedure: ESOPHAGOGASTRODUODENOSCOPY (EGD);  Surgeon: Ladene Artist, MD;  Location: [facility] ENDOSCOPY;  Service: Endoscopy;  Laterality: N/A;  sarah/leone  . Cholecystectomy N/A 02/15/2014    Procedure: LAPAROSCOPIC CHOLECYSTECTOMY with IOC;  Surgeon: Gayland Curry, MD;  Location: [facility];  Service: General;  Laterality: N/A;  . Cardiac catheterization      Social History   Social History  . Marital Status: Married    Spouse Name: N/A  . Number of Children: 1  . Years of Education: N/A   Occupational History  . retired, delivery truck Geophysicist/field seismologist    Social History Main Topics  . Smoking status: Former Smoker    Quit date: 03/13/1976  . Smokeless tobacco: Never Used  .  Alcohol Use: No  . Drug Use: No  . Sexual Activity: Not on file   Other Topics Concern  . Not on file   Social History Narrative   Born in [place], has a large extended family, oldest of several brothers-sister             Medication List       This list is accurate as of: 08/25/15 10:15 AM.  Always use your most recent med list.               acetaminophen 325 MG tablet  Commonly known as:  TYLENOL  Take 2 tablets (650 mg total) by mouth every 6 (six) hours as needed for mild pain, moderate pain, fever or headache.     amLODipine 10 MG tablet  Commonly known as:  NORVASC  Take 1 tablet (10  mg total) by mouth daily.     aspirin 81 MG tablet  Take 81 mg by mouth daily.     atenolol 50 MG tablet  Commonly known as:  TENORMIN  Take 1.5 tablets (75 mg total) by mouth daily.     atorvastatin 20 MG tablet  Commonly known as:  LIPITOR  Take 1 tablet (20 mg total) by mouth daily.     benazepril 20 MG tablet  Commonly known as:  LOTENSIN  Take 1 tablet (20 mg total) by mouth 2 (two) times daily.     bisacodyl 5 MG EC tablet  Commonly known as:  DULCOLAX  Take 5 mg by mouth daily as needed for moderate constipation. Reported on 08/11/2015     diazepam 10 MG tablet  Commonly known as:  VALIUM  Take 1 tablet (10 mg total) by mouth every 12 (twelve) hours as needed for anxiety.     esomeprazole 40 MG capsule  Commonly known as:  NEXIUM  Take 40 mg by mouth daily as needed (acid reflux or indigestion). Reported on 08/24/2015     fexofenadine 180 MG tablet  Commonly known as:  ALLEGRA  Take 180 mg by mouth daily.     niacin 500 MG CR tablet  Commonly known as:  NIASPAN  Take 500 mg by mouth 2 (two) times daily. AT BEDTIME     polyethylene glycol packet  Commonly known as:  MIRALAX / GLYCOLAX  Take 17 g by mouth daily as needed for mild constipation. Reported on 08/11/2015       Family History  Problem Relation Age of Onset  . Hypertension Father   . Heart attack Brother     2 brothers, CABG  . Cancer Neg Hx     colon, prostate  . Diabetes Neg Hx   . Colon cancer Neg Hx        Objective:   Physical Exam BP 118/60 mmHg  Pulse 56  Temp(Src) 98.1 F (36.7 C) (Oral)  Ht 5\' 10"  (1.778 m)  Wt 200 lb 6 oz (90.89 kg)  BMI 28.75 kg/m2  SpO2 96% General:   Well developed, well nourished . NAD.  HEENT:  Normocephalic . Face symmetric, atraumatic. Neck: No thyromegaly Lungs:  CTA B Normal respiratory effort, no intercostal retractions, no accessory muscle use. Heart: RRR,  no murmur.  no pretibial edema bilaterally  Abdomen:  Not distended, soft, non-tender.  No rebound or rigidity.   Skin: Not pale. Not jaundice Neurologic:  alert & oriented X3.  Speech normal, gait appropriate for age and unassisted Psych--  Cognition and judgment appear intact.  Cooperative with  normal attention span and concentration.  Behavior appropriate. No anxious or depressed appearing.    Assessment & Plan:  Assessment Prediabetes HTN Hyperlipidemia GERD, h/o Candida esophagitis 02-2014, nexium prn Insomnia, on diazepam MSK --  occ. 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 Check a BMP Hyperlipidemia: Continue Lipitor, check FLP Insomnia: On diazepam, check UDS and contract CKD: Last creatinine is slightly up in the context of gastroenteritis, recheck a BMP. Slightly abnormal TSH: Recheck labs. RTC 6 months

## 2015-08-25 NOTE — Progress Notes (Signed)
Pre visit review using our clinic review tool, if applicable. No additional management support is needed unless otherwise documented below in the visit note. 

## 2015-08-25 NOTE — Assessment & Plan Note (Addendum)
Td 2008; PNM shot 2008; prevnar 2015; had a flu shot  shingles vaccine -- strongly declined  cscope 02-2015, next 3 years  Prostate cancer screening: new guidelines and reasoning behind them discussed, no family history, previous PSAs normal. Patient decided  no screening. Diet and exercise discussed.

## 2015-09-01 ENCOUNTER — Telehealth: Payer: Self-pay

## 2015-09-01 NOTE — Telephone Encounter (Signed)
UDS: 08/24/2015   Positive for Temazepam   Low risk per Dr. Larose Kells 08/31/2015

## 2015-09-12 ENCOUNTER — Other Ambulatory Visit: Payer: Self-pay | Admitting: Internal Medicine

## 2015-09-18 ENCOUNTER — Encounter: Payer: Self-pay | Admitting: Internal Medicine

## 2015-09-28 ENCOUNTER — Other Ambulatory Visit: Payer: Self-pay | Admitting: Internal Medicine

## 2015-09-29 ENCOUNTER — Ambulatory Visit: Payer: Medicare Other | Admitting: Cardiovascular Disease

## 2015-10-11 ENCOUNTER — Other Ambulatory Visit: Payer: Self-pay | Admitting: Internal Medicine

## 2015-10-12 DIAGNOSIS — Z85828 Personal history of other malignant neoplasm of skin: Secondary | ICD-10-CM | POA: Diagnosis not present

## 2015-10-12 DIAGNOSIS — L821 Other seborrheic keratosis: Secondary | ICD-10-CM | POA: Diagnosis not present

## 2015-10-12 DIAGNOSIS — L570 Actinic keratosis: Secondary | ICD-10-CM | POA: Diagnosis not present

## 2015-11-29 ENCOUNTER — Other Ambulatory Visit: Payer: Self-pay | Admitting: Internal Medicine

## 2016-02-26 ENCOUNTER — Ambulatory Visit (INDEPENDENT_AMBULATORY_CARE_PROVIDER_SITE_OTHER): Payer: Medicare Other | Admitting: Internal Medicine

## 2016-02-26 ENCOUNTER — Encounter: Payer: Self-pay | Admitting: Internal Medicine

## 2016-02-26 VITALS — BP 126/70 | HR 55 | Temp 97.8°F | Resp 14 | Ht 70.0 in | Wt 202.4 lb

## 2016-02-26 DIAGNOSIS — Z09 Encounter for follow-up examination after completed treatment for conditions other than malignant neoplasm: Secondary | ICD-10-CM

## 2016-02-26 DIAGNOSIS — I10 Essential (primary) hypertension: Secondary | ICD-10-CM

## 2016-02-26 DIAGNOSIS — E785 Hyperlipidemia, unspecified: Secondary | ICD-10-CM

## 2016-02-26 DIAGNOSIS — I701 Atherosclerosis of renal artery: Secondary | ICD-10-CM

## 2016-02-26 LAB — BASIC METABOLIC PANEL
BUN: 27 mg/dL — ABNORMAL HIGH (ref 6–23)
CHLORIDE: 106 meq/L (ref 96–112)
CO2: 28 mEq/L (ref 19–32)
CREATININE: 1.38 mg/dL (ref 0.40–1.50)
Calcium: 9.4 mg/dL (ref 8.4–10.5)
GFR: 53.34 mL/min — AB (ref >60.00)
Glucose, Bld: 107 mg/dL — ABNORMAL HIGH (ref 70–99)
POTASSIUM: 4 meq/L (ref 3.5–5.1)
Sodium: 141 mEq/L (ref 135–145)

## 2016-02-26 NOTE — Assessment & Plan Note (Addendum)
HTN: Well-controlled on amlodipine, Tenormin, Lotensin. Check a BMP. Hyperlipidemia: Last FLP satisfactory Claudication: As described above, now asx, good pulses. Rx  Observation. RAS: No bruit on abdominal exam today, checking a BMP RTC 08-2016 CPX

## 2016-02-26 NOTE — Patient Instructions (Signed)
GO TO THE LAB : Get the blood work     GO TO THE FRONT DESK Schedule your next appointment for a physical  exam by February 2018

## 2016-02-26 NOTE — Progress Notes (Signed)
Subjective:    Patient ID: Dustin Ashley, male    DOB: 11-16-1940, 75 y.o.   MRN: [id number]  DOS:  02/26/2016 Type of visit - description : Routine office visit Interval history: Few weeks ago, developed back pain, went to a chiropractor, doing better now. A insurance nurse went to his home and did some screening testing,  was told  he probably has decreased circulation on the left leg. Meds reviewed Labs reviewed   Review of Systems  Denies chest pain or difficulty breathing No nausea, vomiting, diarrhea. At some point has claudication bilaterally, he started a walking program and uis now essentially, asymptomatic. Has not been walking in the last few weeks due to back pain but plans to go back to his routine.  Past Medical History:  Diagnosis Date  . Allergic rhinitis   . Allergy    seasonal  . 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 CAD (coronary artery disease)   . Chronic renal insufficiency, stage II (mild)    Cr ~ 1.4, u/s 4-12 normal kidneys  . Gallstone pancreatitis 2015  . GERD (gastroesophageal reflux disease)   . HOH (hard of hearing)    has a hearing aid L, sees audiology rountinely  . Hyperglycemia    A1C 5.8 04-2010  . Hyperlipemia   . Hypertension   . Pain, joint, multiple sites    uses valium, occ uses for insomnia. uses for shoulder  pain  . Paroxysmal atrial fibrillation (HCC)   . Personal history of colonic polyps   . Pulmonary emboli ([place])    02-2014  . RAS (renal artery stenosis) ([place]) 05-2012     Past Surgical History:  Procedure Laterality Date  . CARDIAC CATHETERIZATION    . CHOLECYSTECTOMY N/A 02/15/2014   Procedure: LAPAROSCOPIC CHOLECYSTECTOMY with IOC;  Surgeon: Gayland Curry, MD;  Location: [place];  Service: General;  Laterality: N/A;  . CORONARY ARTERY BYPASS GRAFT  1995  . ESOPHAGOGASTRODUODENOSCOPY N/A 03/02/2014   Procedure: ESOPHAGOGASTRODUODENOSCOPY (EGD);  Surgeon: Ladene Artist, MD;  Location: [facility] ENDOSCOPY;  Service: Endoscopy;  Laterality: N/A;   sarah/leone    Social History   Social History  . Marital status: Married    Spouse name: N/A  . Number of children: 1  . Years of education: N/A   Occupational History  . retired, delivery truck Geophysicist/field seismologist    Social History Main Topics  . Smoking status: Former Smoker    Quit date: 03/13/1976  . Smokeless tobacco: Never Used  . Alcohol use No  . Drug use: No  . Sexual activity: Not on file   Other Topics Concern  . Not on file   Social History Narrative   Born in [place], has a large extended family, oldest of several brothers-sister             Medication List       Accurate as of 02/26/16  5:46 PM. Always use your most recent med list.          acetaminophen 325 MG tablet Commonly known as:  TYLENOL Take 2 tablets (650 mg total) by mouth every 6 (six) hours as needed for mild pain, moderate pain, fever or headache.   amLODipine 10 MG tablet Commonly known as:  NORVASC Take 1 tablet (10 mg total) by mouth daily.   aspirin 81 MG tablet Take 81 mg by mouth daily.   atenolol 50 MG tablet Commonly known as:  TENORMIN Take 1.5 tablets (75 mg total) by mouth daily.  atorvastatin 20 MG tablet Commonly known as:  LIPITOR Take 1 tablet (20 mg total) by mouth daily.   benazepril 20 MG tablet Commonly known as:  LOTENSIN Take 1 tablet (20 mg total) by mouth 2 (two) times daily.   diazepam 10 MG tablet Commonly known as:  VALIUM Take 1 tablet (10 mg total) by mouth every 12 (twelve) hours as needed for anxiety.   esomeprazole 40 MG capsule Commonly known as:  NEXIUM Take 40 mg by mouth daily as needed (acid reflux or indigestion). Reported on 08/24/2015   fexofenadine 180 MG tablet Commonly known as:  ALLEGRA Take 180 mg by mouth daily.   niacin 500 MG CR tablet Commonly known as:  NIASPAN Take 500 mg by mouth 2 (two) times daily. AT BEDTIME   polyethylene glycol packet Commonly known as:  MIRALAX / GLYCOLAX Take 17 g by mouth daily as needed for  mild constipation. Reported on 08/11/2015          Objective:   Physical Exam BP 126/70 (BP Location: Left Arm, Patient Position: Sitting, Cuff Size: Normal)   Pulse (!) 55   Temp 97.8 F (36.6 C) (Oral)   Resp 14   Ht 5\' 10"  (1.778 m)   Wt 202 lb 6 oz (91.8 kg)   SpO2 96%   BMI 29.04 kg/m  General:   Well developed, well nourished . NAD.  HEENT:  Normocephalic . Face symmetric, atraumatic Lungs:  CTA B Normal respiratory effort, no intercostal retractions, no accessory muscle use. Heart: RRR,  no murmur.  no pretibial edema bilaterally  Normal femoral and pedal pulses bilaterally Abdomen:  Not distended, soft, non-tender. No rebound or rigidity. . No bruit Skin: Not pale. Not jaundice Neurologic:  alert & oriented X3.  Speech normal, gait appropriate for age and unassisted Psych--  Cognition and judgment appear intact.  Cooperative with normal attention span and concentration.  Behavior appropriate. No anxious or depressed appearing.    Assessment & Plan:   Assessment Prediabetes HTN Hyperlipidemia GERD, h/o Candida esophagitis 02-2014, nexium prn Insomnia, on diazepam MSK --  occ. 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 Check a BMP. Hyperlipidemia: Last FLP satisfactory Claudication: As described above, now asx, good pulses. Rx  Observation. RAS: No bruit on abdominal exam today, checking a BMP RTC 08-2016 CPX

## 2016-02-26 NOTE — Progress Notes (Signed)
Pre visit review using our clinic review tool, if applicable. No additional management support is needed unless otherwise documented below in the visit note. 

## 2016-03-27 ENCOUNTER — Encounter (HOSPITAL_BASED_OUTPATIENT_CLINIC_OR_DEPARTMENT_OTHER): Payer: Self-pay

## 2016-03-27 ENCOUNTER — Ambulatory Visit (INDEPENDENT_AMBULATORY_CARE_PROVIDER_SITE_OTHER): Payer: Medicare Other | Admitting: Medical

## 2016-03-27 ENCOUNTER — Emergency Department (HOSPITAL_BASED_OUTPATIENT_CLINIC_OR_DEPARTMENT_OTHER): Payer: Medicare Other

## 2016-03-27 ENCOUNTER — Emergency Department (HOSPITAL_BASED_OUTPATIENT_CLINIC_OR_DEPARTMENT_OTHER)
Admission: EM | Admit: 2016-03-27 | Discharge: 2016-03-27 | Disposition: A | Discharge status: Home / Self Care | Payer: Medicare Other | Attending: Emergency Medicine | Admitting: Emergency Medicine

## 2016-03-27 ENCOUNTER — Encounter: Payer: Self-pay | Admitting: Medical

## 2016-03-27 VITALS — BP 170/90 | HR 62 | Temp 98.9°F | Ht 70.0 in | Wt 200.0 lb

## 2016-03-27 DIAGNOSIS — I129 Hypertensive chronic kidney disease with stage 1 through stage 4 chronic kidney disease, or unspecified chronic kidney disease: Secondary | ICD-10-CM | POA: Insufficient documentation

## 2016-03-27 DIAGNOSIS — R109 Unspecified abdominal pain: Secondary | ICD-10-CM | POA: Diagnosis not present

## 2016-03-27 DIAGNOSIS — K5900 Constipation, unspecified: Secondary | ICD-10-CM | POA: Diagnosis not present

## 2016-03-27 DIAGNOSIS — Z87891 Personal history of nicotine dependence: Secondary | ICD-10-CM | POA: Diagnosis not present

## 2016-03-27 DIAGNOSIS — R1013 Epigastric pain: Secondary | ICD-10-CM

## 2016-03-27 DIAGNOSIS — R112 Nausea with vomiting, unspecified: Secondary | ICD-10-CM | POA: Insufficient documentation

## 2016-03-27 DIAGNOSIS — Z79899 Other long term (current) drug therapy: Secondary | ICD-10-CM | POA: Diagnosis not present

## 2016-03-27 DIAGNOSIS — Z7982 Long term (current) use of aspirin: Secondary | ICD-10-CM | POA: Insufficient documentation

## 2016-03-27 DIAGNOSIS — N182 Chronic kidney disease, stage 2 (mild): Secondary | ICD-10-CM | POA: Diagnosis not present

## 2016-03-27 DIAGNOSIS — I2510 Atherosclerotic heart disease of native coronary artery without angina pectoris: Secondary | ICD-10-CM | POA: Insufficient documentation

## 2016-03-27 HISTORY — DX: Disorder of kidney and ureter, unspecified: N28.9

## 2016-03-27 LAB — URINALYSIS, ROUTINE W REFLEX MICROSCOPIC
BILIRUBIN URINE: NEGATIVE
Glucose, UA: NEGATIVE mg/dL
HGB URINE DIPSTICK: NEGATIVE
Ketones, ur: NEGATIVE mg/dL
Leukocytes, UA: NEGATIVE
Nitrite: NEGATIVE
PROTEIN: NEGATIVE mg/dL
SPECIFIC GRAVITY, URINE: 1.004 — AB (ref 1.005–1.030)
pH: 6.5 (ref 5.0–8.0)

## 2016-03-27 LAB — COMPREHENSIVE METABOLIC PANEL
ALBUMIN: 3.8 g/dL (ref 3.5–5.0)
ALK PHOS: 95 U/L (ref 38–126)
ALT: 20 U/L (ref 17–63)
AST: 20 U/L (ref 15–41)
Anion gap: 7 (ref 5–15)
BUN: 19 mg/dL (ref 6–20)
CALCIUM: 9 mg/dL (ref 8.9–10.3)
CO2: 27 mmol/L (ref 22–32)
CREATININE: 1.02 mg/dL (ref 0.61–1.24)
Chloride: 101 mmol/L (ref 101–111)
GFR calc Af Amer: >60 mL/min (ref >60)
GFR calc non Af Amer: >60 mL/min (ref >60)
GLUCOSE: 129 mg/dL — AB (ref 65–99)
Potassium: 3.6 mmol/L (ref 3.5–5.1)
SODIUM: 135 mmol/L (ref 135–145)
Total Bilirubin: 0.8 mg/dL (ref 0.3–1.2)
Total Protein: 7.2 g/dL (ref 6.5–8.1)

## 2016-03-27 LAB — CBC WITH DIFFERENTIAL/PLATELET
Basophils Absolute: 0 10*3/uL (ref 0.0–0.1)
Basophils Relative: 0 %
EOS ABS: 0.1 10*3/uL (ref 0.0–0.7)
EOS PCT: 0 %
HCT: 41.6 % (ref 39.0–52.0)
Hemoglobin: 15 g/dL (ref 13.0–17.0)
LYMPHS ABS: 2.3 10*3/uL (ref 0.7–4.0)
Lymphocytes Relative: 14 %
MCH: 29.5 pg (ref 26.0–34.0)
MCHC: 36.1 g/dL — AB (ref 30.0–36.0)
MCV: 81.7 fL (ref 78.0–100.0)
MONO ABS: 1.2 10*3/uL — AB (ref 0.1–1.0)
MONOS PCT: 7 %
Neutro Abs: 13.1 10*3/uL — ABNORMAL HIGH (ref 1.7–7.7)
Neutrophils Relative %: 79 %
PLATELETS: 161 10*3/uL (ref 150–400)
RBC: 5.09 MIL/uL (ref 4.22–5.81)
RDW: 13 % (ref 11.5–15.5)
WBC: 16.7 10*3/uL — ABNORMAL HIGH (ref 4.0–10.5)

## 2016-03-27 LAB — TROPONIN I

## 2016-03-27 LAB — LIPASE, BLOOD: Lipase: 27 U/L (ref 11–51)

## 2016-03-27 MED ORDER — ONDANSETRON HCL 4 MG/2ML IJ SOLN
4.0000 mg | Freq: Once | INTRAMUSCULAR | Status: AC
  Administered 2016-03-27: 4 mg via INTRAVENOUS
  Filled 2016-03-27: qty 2

## 2016-03-27 MED ORDER — SODIUM CHLORIDE 0.9 % IV BOLUS (SEPSIS)
1000.0000 mL | Freq: Once | INTRAVENOUS | Status: AC
  Administered 2016-03-27: 1000 mL via INTRAVENOUS

## 2016-03-27 MED ORDER — FENTANYL CITRATE (PF) 100 MCG/2ML IJ SOLN
50.0000 ug | Freq: Once | INTRAMUSCULAR | Status: AC
Start: 2016-03-27 — End: 2016-03-27
  Administered 2016-03-27: 50 ug via INTRAVENOUS
  Filled 2016-03-27: qty 2

## 2016-03-27 MED ORDER — ONDANSETRON 4 MG PO TBDP: 4.0000 mg | ORAL_TABLET | Freq: Four times a day (QID) | ORAL | 0 refills | Status: DC | PRN

## 2016-03-27 MED ORDER — IOPAMIDOL (ISOVUE-300) INJECTION 61%
100.0000 mL | Freq: Once | INTRAVENOUS | Status: AC | PRN
  Administered 2016-03-27: 100 mL via INTRAVENOUS

## 2016-03-27 MED FILL — ONDANSETRON ODT 4 MG TABLET: 4 | 4 days supply | Qty: 10 | Fill #0

## 2016-03-27 NOTE — ED Triage Notes (Signed)
C/o abd pain, n/v started yesterday-pt was sent from PCP office in the building-NAD-steady gait

## 2016-03-27 NOTE — ED Notes (Signed)
Patient transported to CT 

## 2016-03-27 NOTE — Progress Notes (Signed)
Subjective:    Patient ID: Dustin Ashley, male    DOB: [birth year], 75 y.o.   MRN: [id number]  HPI   Pt in with some abdomen pain. This pain started yesterday am. Pt pain level is 8/10(level now 6/10). He will try to eat and will vomit up anything he eats. He feels nausea all the time.   Pt states felt find Saturday, Sunday and Monday.  Pt mentioned on Friday had brief episode of feeling weak and sweaty. He went in store. Ate honey bun and pepsi. Felt better.   Pt pain states lying on his back is worse.  Pt did not take his bp meds today. Usually his bp is better. He is on 3 meds.  Pt on Monday night had some deer sausage. No pain after that. Grandson ate and no symptoms.   Pt had bm on Monday and was normal. Pt has no gallbladder. 2 years since that surgery. No black or bright red blood.   Pt does not drink alcohol.  Review of Systems  Constitutional: Negative for chills, fatigue and fever.  HENT: Negative for congestion and drooling.   Respiratory: Negative for cough, chest tightness, shortness of breath and wheezing.   Cardiovascular: Negative for chest pain and palpitations.  Gastrointestinal: Positive for abdominal pain, nausea and vomiting. Negative for abdominal distention, blood in stool, constipation and diarrhea.  Neurological: Negative for dizziness and headaches.  Hematological: Negative for adenopathy. Does not bruise/bleed easily.  Psychiatric/Behavioral: Negative for behavioral problems and confusion.   Past Medical History:  Diagnosis Date  . Allergic rhinitis   . Allergy    seasonal  . 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 CAD (coronary artery disease)   . Chronic renal insufficiency, stage II (mild)    Cr ~ 1.4, u/s 4-12 normal kidneys  . Gallstone pancreatitis 2015  . GERD (gastroesophageal reflux disease)   . HOH (hard of hearing)    has a hearing aid L, sees audiology rountinely  . Hyperglycemia    A1C 5.8 04-2010  . Hyperlipemia   . Hypertension   . Pain, joint,  multiple sites    uses valium, occ uses for insomnia. uses for shoulder  pain  . Paroxysmal atrial fibrillation (HCC)   . Personal history of colonic polyps   . Pulmonary emboli ([place])    02-2014  . RAS (renal artery stenosis) ([place]) 05-2012      Social History   Social History  . Marital status: Married    Spouse name: N/A  . Number of children: 1  . Years of education: N/A   Occupational History  . retired, delivery truck Geophysicist/field seismologist    Social History Main Topics  . Smoking status: Former Smoker    Quit date: 03/13/1976  . Smokeless tobacco: Never Used  . Alcohol use No  . Drug use: No  . Sexual activity: Not on file   Other Topics Concern  . Not on file   Social History Narrative   Born in [place], has a large extended family, oldest of several brothers-sister         Past Surgical History:  Procedure Laterality Date  . CARDIAC CATHETERIZATION    . CHOLECYSTECTOMY N/A 02/15/2014   Procedure: LAPAROSCOPIC CHOLECYSTECTOMY with IOC;  Surgeon: Gayland Curry, MD;  Location: [facility];  Service: General;  Laterality: N/A;  . CORONARY ARTERY BYPASS GRAFT  1995  . ESOPHAGOGASTRODUODENOSCOPY N/A 03/02/2014   Procedure: ESOPHAGOGASTRODUODENOSCOPY (EGD);  Surgeon: Ladene Artist, MD;  Location: [facility]  ENDOSCOPY;  Service: Endoscopy;  Laterality: N/A;  sarah/leone    Family History  Problem Relation Age of Onset  . Hypertension Father   . Heart attack Brother     2 brothers, CABG  . Cancer Neg Hx     colon, prostate  . Diabetes Neg Hx   . Colon cancer Neg Hx     Allergies  Allergen Reactions  . Hydrocodone-Acetaminophen Other (See Comments)    REACTION: bladder obstruction  . Tramadol Hcl Other (See Comments)    REACTION: bladder obstruction    Current Outpatient Prescriptions on File Prior to Visit  Medication Sig Dispense Refill  . acetaminophen (TYLENOL) 325 MG tablet Take 2 tablets (650 mg total) by mouth every 6 (six) hours as needed for mild pain, moderate pain, fever  or headache.    Marland Kitchen amLODipine (NORVASC) 10 MG tablet Take 1 tablet (10 mg total) by mouth daily. 90 tablet 2  . aspirin 81 MG tablet Take 81 mg by mouth daily.    Marland Kitchen atenolol (TENORMIN) 50 MG tablet Take 1.5 tablets (75 mg total) by mouth daily. 45 tablet 6  . atorvastatin (LIPITOR) 20 MG tablet Take 1 tablet (20 mg total) by mouth daily. 90 tablet 2  . benazepril (LOTENSIN) 20 MG tablet Take 1 tablet (20 mg total) by mouth 2 (two) times daily. 180 tablet 1  . diazepam (VALIUM) 10 MG tablet Take 1 tablet (10 mg total) by mouth every 12 (twelve) hours as needed for anxiety. 60 tablet 3  . esomeprazole (NEXIUM) 40 MG capsule Take 40 mg by mouth daily as needed (acid reflux or indigestion). Reported on 08/24/2015    . fexofenadine (ALLEGRA) 180 MG tablet Take 180 mg by mouth daily.      . niacin (NIASPAN) 500 MG CR tablet Take 500 mg by mouth 2 (two) times daily. AT BEDTIME    . polyethylene glycol (MIRALAX / GLYCOLAX) packet Take 17 g by mouth daily as needed for mild constipation. Reported on 08/11/2015     No current facility-administered medications on file prior to visit.     BP (!) 170/90   Pulse 62   Temp 98.9 F (37.2 C) (Oral)   Ht 5\' 10"  (1.778 m)   Wt 200 lb (90.7 kg)   SpO2 99%   BMI 28.70 kg/m       Objective:   Physical Exam  General Appearance- Not in acute distress.  HEENT Eyes- Scleraeral/Conjuntiva-bilat- Not Yellow. Mouth & Throat- Normal.  Chest and Lung Exam Auscultation: Breath sounds:-Normal. Adventitious sounds:- No Adventitious sounds.  Cardiovascular Auscultation:Rythm - Regular. Heart Sounds -Normal heart sounds.  Abdomen Inspection:-Inspection Normal.  Palpation/Perucssion: Palpation and Percussion of the abdomen reveal- moderate epigastric  Tender, No Rebound tenderness, No rigidity(Guarding) and No Palpable abdominal masses.  Liver:-Normal.  Spleen:- Normal.   Rt lower abdomen. Level of umbilicus hear bruit. Over epigastric maybe faint  bruit.  Back-no cva tenderness.      Assessment & Plan:  With your high level of pain recently abdomen, constant vomiting with oral intake and exam findings, both I and Dr. Larose Kells think best for you to be evaluated in ED. Stat labs and imaging studies would be beneficial rather than waiting on our lab service.  I did talk with ED physician regarding resent abdominal pain and presentation.  Follow up date to be determined after ED evaluation.

## 2016-03-27 NOTE — ED Provider Notes (Signed)
[facility] DEPT [facility] Provider Note   CSN: [id number] Arrival date & time: 03/27/16  1133     History   Chief Complaint Chief Complaint  Patient presents with  . Abdominal Pain    Dustin Ashley is a 75 y.o. male.  Dustin  75 year old male presenting from his PCPs office with abdominal pain. Had abdominal pain starting around noon yesterday. He states he's had multiple episodes of vomiting every time he tries to eat or drink since yesterday. No bowel movement in 2 days although this is not particularly abnormal for him. His pain has been generalized and he felt a firm swelling in his upper abdomen. Last night the pain was at its worst, was an 8/10. Today the pain is better, around 5/10. He feels like the swelling is better. There is no new back pain, chest pain, or shortness of breath. No urinary symptoms. Had his gallbladder removed in 2015.  Past Medical History:  Diagnosis Date  . Allergic rhinitis   . Allergy    seasonal  . 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 CAD (coronary artery disease)   . Chronic renal insufficiency, stage II (mild)    Cr ~ 1.4, u/s 4-12 normal kidneys  . Gallstone pancreatitis 2015  . GERD (gastroesophageal reflux disease)   . HOH (hard of hearing)    has a hearing aid L, sees audiology rountinely  . Hyperglycemia    A1C 5.8 04-2010  . Hyperlipemia   . Hypertension   . Pain, joint, multiple sites    uses valium, occ uses for insomnia. uses for shoulder  pain  . Paroxysmal atrial fibrillation (HCC)   . Personal history of colonic polyps   . Pulmonary emboli ([place])    02-2014  . RAS (renal artery stenosis) ([place]) 05-2012   . Renal insufficiency     Patient Active Problem List   Diagnosis Date Noted  . PCP NOTES >>>>>>>>>>>>>>>>>> 02/26/2016  . 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) 12/13/2014  . Elevated TSH 04/12/2014  . Atrial fibrillation ([place]) 03/15/2014  . Pulmonary embolism- DVT 02-2014, provoked  02/26/2014  . RAS (renal artery stenosis) ([place]) 07/01/2012  .  Rash 09/26/2011  . Annual physical exam 03/14/2011  . Chronic renal insufficiency, stage II (mild)   . Pain, joint, multiple sites   . CPK, ABNORMAL 11/27/2009  . DRY SKIN 07/25/2008  . ERECTILE DYSFUNCTION 03/28/2008  . FATIGUE 03/28/2008  . BACK PAIN 12/01/2006  . HYPERGLYCEMIA 12/01/2006  . HYPERLIPIDEMIA 08/07/2006  . Essential hypertension 08/07/2006  . CORONARY ARTERY DISEASE 08/07/2006  . ALLERGIC RHINITIS 08/07/2006  . GERD (and candida esophagitis 02-2014) 08/07/2006  . History of colonic polyps 02/17/2003    Past Surgical History:  Procedure Laterality Date  . CARDIAC CATHETERIZATION    . CHOLECYSTECTOMY N/A 02/15/2014   Procedure: LAPAROSCOPIC CHOLECYSTECTOMY with IOC;  Surgeon: Gayland Curry, MD;  Location: [facility];  Service: General;  Laterality: N/A;  . CORONARY ARTERY BYPASS GRAFT  1995  . ESOPHAGOGASTRODUODENOSCOPY N/A 03/02/2014   Procedure: ESOPHAGOGASTRODUODENOSCOPY (EGD);  Surgeon: Ladene Artist, MD;  Location: [facility] ENDOSCOPY;  Service: Endoscopy;  Laterality: N/A;  sarah/leone       Home Medications    Prior to Admission medications   Medication Sig Start Date End Date Taking? Authorizing Provider  acetaminophen (TYLENOL) 325 MG tablet Take 2 tablets (650 mg total) by mouth every 6 (six) hours as needed for mild pain, moderate pain, fever or headache. 03/06/14   Modena Jansky, MD  amLODipine ([place])  10 MG tablet Take 1 tablet (10 mg total) by mouth daily. 09/28/15   Colon Branch, MD  aspirin 81 MG tablet Take 81 mg by mouth daily.    Historical Provider, MD  atenolol (TENORMIN) 50 MG tablet Take 1.5 tablets (75 mg total) by mouth daily. 11/30/15   Colon Branch, MD  atorvastatin (LIPITOR) 20 MG tablet Take 1 tablet (20 mg total) by mouth daily. 09/12/15   Colon Branch, MD  benazepril (LOTENSIN) 20 MG tablet Take 1 tablet (20 mg total) by mouth 2 (two) times daily. 10/12/15   Colon Branch, MD  diazepam (VALIUM) 10 MG tablet Take 1 tablet (10 mg total) by mouth every 12  (twelve) hours as needed for anxiety. 07/25/15   Colon Branch, MD  esomeprazole (NEXIUM) 40 MG capsule Take 40 mg by mouth daily as needed (acid reflux or indigestion). Reported on 08/24/2015    Historical Provider, MD  fexofenadine (ALLEGRA) 180 MG tablet Take 180 mg by mouth daily.      Historical Provider, MD  niacin (NIASPAN) 500 MG CR tablet Take 500 mg by mouth 2 (two) times daily. AT BEDTIME    Historical Provider, MD  ondansetron (ZOFRAN ODT) 4 MG disintegrating tablet Take 1 tablet (4 mg total) by mouth every 6 (six) hours as needed for nausea. 03/27/16   Sherwood Gambler, MD  polyethylene glycol (MIRALAX / GLYCOLAX) packet Take 17 g by mouth daily as needed for mild constipation. Reported on 08/11/2015 03/06/14   Modena Jansky, MD    Family History Family History  Problem Relation Age of Onset  . Hypertension Father   . Heart attack Brother     2 brothers, CABG  . Cancer Neg Hx     colon, prostate  . Diabetes Neg Hx   . Colon cancer Neg Hx     Social History Social History  Substance Use Topics  . Smoking status: Former Smoker    Quit date: 03/13/1976  . Smokeless tobacco: Never Used  . Alcohol use No     Allergies   Hydrocodone-acetaminophen and Tramadol hcl   Review of Systems Review of Systems  Constitutional: Negative for fever.  Respiratory: Negative for shortness of breath.   Cardiovascular: Negative for chest pain.  Gastrointestinal: Positive for abdominal pain, constipation, nausea and vomiting.  Genitourinary: Negative for dysuria.  Musculoskeletal: Negative for back pain.  All other systems reviewed and are negative.    Physical Exam Updated Vital Signs BP 159/58   Pulse 62   Temp 98.3 F (36.8 C) (Oral)   Resp 19   Ht 5\' 10"  (1.778 m)   Wt 200 lb (90.7 kg)   SpO2 96%   BMI 28.70 kg/m   Physical Exam  Constitutional: He is oriented to person, place, and time. He appears well-developed and well-nourished. No distress.  HENT:  Head:  Normocephalic and atraumatic.  Right Ear: External ear normal.  Left Ear: External ear normal.  Nose: Nose normal.  Eyes: Right eye exhibits no discharge. Left eye exhibits no discharge.  Neck: Neck supple.  Cardiovascular: Normal rate, regular rhythm and normal heart sounds.   Pulmonary/Chest: Effort normal and breath sounds normal.  Abdominal: Soft. He exhibits no distension. There is no tenderness.  Musculoskeletal: He exhibits no edema.  Neurological: He is alert and oriented to person, place, and time.  Skin: Skin is warm and dry. He is not diaphoretic.  Nursing note and vitals reviewed.    ED Treatments /  Results  Labs (all labs ordered are listed, but only abnormal results are displayed) Labs Reviewed  COMPREHENSIVE METABOLIC PANEL - Abnormal; Notable for the following:       Result Value   Glucose, Bld 129 (*)    All other components within normal limits  CBC WITH DIFFERENTIAL/PLATELET - Abnormal; Notable for the following:    WBC 16.7 (*)    MCHC 36.1 (*)    Neutro Abs 13.1 (*)    Monocytes Absolute 1.2 (*)    All other components within normal limits  URINALYSIS, ROUTINE W REFLEX MICROSCOPIC (NOT AT [facility]) - Abnormal; Notable for the following:    Specific Gravity, Urine 1.004 (*)    All other components within normal limits  LIPASE, BLOOD  TROPONIN I    EKG  EKG Interpretation  Date/Time:  Wednesday March 27 2016 12:09:44 EDT Ventricular Rate:  62 PR Interval:    QRS Duration: 95 QT Interval:  443 QTC Calculation: 450 R Axis:   51 Text Interpretation:  Normal sinus rhythm Prolonged PR interval T wave abnormality no longer present when compared to 2015 Confirmed by Kenedee Molesky MD, Reeder Brisby [id number]) on 03/27/2016 12:21:42 PM       Radiology Ct Abdomen Pelvis W Contrast  Result Date: 03/27/2016 CLINICAL DATA:  Abdominal pain and vomiting. EXAM: CT ABDOMEN AND PELVIS WITH CONTRAST TECHNIQUE: Multidetector CT imaging of the abdomen and pelvis was performed  using the standard protocol following bolus administration of intravenous contrast. CONTRAST:  136mL ISOVUE-300 IOPAMIDOL (ISOVUE-300) INJECTION 61% COMPARISON:  02/26/2014 FINDINGS: Lower chest: Status post CABG. No acute finding. Tiny sliding hiatal hernia. Hepatobiliary: Probable hepatic steatosis. No focal liver abnormality.Cholecystectomy. Gallbladder fossa fluid collection seen previously is resolved. No biliary dilatation. Pancreas: Unremarkable. Spleen: Unremarkable. Adrenals/Urinary Tract: Negative adrenals. No hydronephrosis or stone. Nonspecific bladder wall thickening. Stomach/Bowel: No obstruction. No appendicitis. Few colonic diverticula. Vascular/Lymphatic: Advanced atherosclerotic calcification of the aorta and branch vessels. There is high-grade celiac axis stenosis but patent SMA and IMA without evidence of proximal flow limiting stenosis. No hypertrophied peripancreatic arteries. Bilateral proximal renal artery atheromatous narrowing. Severe stenosis of the left common iliac. No evidence of acute vessel occlusion. No mass or adenopathy. Reproductive:Mild generalized enlargement of the prostate. Other: No ascites or pneumoperitoneum. Musculoskeletal: No acute abnormalities. Chronic bilateral L5 pars defects with borderline grade 2 anterolisthesis and focally advanced disc degeneration. IMPRESSION: 1. No acute finding. 2. Extensive atherosclerosis with high-grade celiac, bilateral renal, and left iliac stenoses. 3. Chronic L5 pars defects with anterolisthesis. Electronically Signed   By: Monte Fantasia M.D.   On: 03/27/2016 13:35    Procedures Procedures (including critical care time)  Medications Ordered in ED Medications  sodium chloride 0.9 % bolus 1,000 mL (0 mLs Intravenous Stopped 03/27/16 1355)  ondansetron (ZOFRAN) injection 4 mg (4 mg Intravenous Given 03/27/16 1227)  fentaNYL (SUBLIMAZE) injection 50 mcg (50 mcg Intravenous Given 03/27/16 1227)  iopamidol (ISOVUE-300) 61 %  injection 100 mL (100 mLs Intravenous Contrast Given 03/27/16 1305)     Initial Impression / Assessment and Plan / ED Course  I have reviewed the triage vital signs and the nursing notes.  Pertinent labs & imaging results that were available during my care of the patient were reviewed by me and considered in my medical decision making (see chart for details).  Clinical Course  Comment By Time  Will get CT to r/o obstruction, etc. IV fluids, labs, morphine/zofran. Will get ECG given no reproducible tenderness Sherwood Gambler, MD 09/13 1155  Patient  is feeling better. There has been no vomiting. CT is unremarkable for acute pathology. Lab work is only pertinent for elevated white blood cell count which could be reactive to multiple doses of vomiting in the last 24 hours. I low suspicion for occult infection. No signs of any type of intra-abdominal emergency. I doubt this is atypical ACS with a benign ECG and troponin. Will discharge with Zofran, use Tylenol for pain, and follow-up with PCP in 2 days if not improving. Discussed strict return precautions. Sherwood Gambler, MD 09/13 1352    Final Clinical Impressions(s) / ED Diagnoses   Final diagnoses:  Abdominal pain, unspecified abdominal location  Nausea and vomiting in adult    New Prescriptions New Prescriptions   ONDANSETRON (ZOFRAN ODT) 4 MG DISINTEGRATING TABLET    Take 1 tablet (4 mg total) by mouth every 6 (six) hours as needed for nausea.     Sherwood Gambler, MD 03/27/16 1355

## 2016-03-27 NOTE — ED Notes (Signed)
CT notified pt has completed oral contrast.  

## 2016-03-27 NOTE — ED Notes (Signed)
Family at bedside. 

## 2016-03-27 NOTE — ED Notes (Signed)
MD at bedside. 

## 2016-03-27 NOTE — Patient Instructions (Addendum)
With your high level of pain recently abdomen, constant vomiting with oral intake and exam findings, both I and Dr. Larose Kells think best for you to be evaluated in ED. Stat labs and imaging studies would be beneficial rather than waiting on our lab service.  I did talk with ED physician regarding resent abdominal pain and presentation. Please go down stairs now.  Follow up date to be determined after ED evaluation.

## 2016-04-12 ENCOUNTER — Other Ambulatory Visit: Payer: Self-pay | Admitting: Internal Medicine

## 2016-05-06 ENCOUNTER — Telehealth: Payer: Self-pay | Admitting: Cardiovascular Disease

## 2016-05-06 DIAGNOSIS — I701 Atherosclerosis of renal artery: Secondary | ICD-10-CM

## 2016-05-06 DIAGNOSIS — I150 Renovascular hypertension: Secondary | ICD-10-CM

## 2016-05-06 NOTE — Telephone Encounter (Signed)
Order placed. I will have a scheduler contact the pt to arrange study.

## 2016-05-06 NOTE — Telephone Encounter (Signed)
New message      Wife called to schedule pt's yearly appt.  Pt is also due to have a renal duplex prior to 08-06-15 appt.  Please put order in computer for ultrasound and send msg to [facility] to schedule. Wife aware someone will call in a few days to schedule ultrasound

## 2016-05-10 DIAGNOSIS — Z85828 Personal history of other malignant neoplasm of skin: Secondary | ICD-10-CM | POA: Diagnosis not present

## 2016-05-10 DIAGNOSIS — L570 Actinic keratosis: Secondary | ICD-10-CM | POA: Diagnosis not present

## 2016-05-13 NOTE — Telephone Encounter (Signed)
Renal duplex scheduled 07/15/2016.

## 2016-06-03 ENCOUNTER — Telehealth: Payer: Self-pay

## 2016-06-03 MED ORDER — DIAZEPAM 10 MG PO TABS: 10.0000 mg | ORAL_TABLET | Freq: Two times a day (BID) | ORAL | 3 refills | Status: DC | PRN

## 2016-06-03 NOTE — Telephone Encounter (Signed)
Pt is requesting refill on Diazepam.  Last OV: 02/26/2016 Last Fill: 07/25/2015 #60 and 3RF UDS: 08/24/2015 Low risk  Please advise.

## 2016-06-03 NOTE — Telephone Encounter (Signed)
Rx printed, awaiting MD signature.  

## 2016-06-03 NOTE — Telephone Encounter (Signed)
Ok 60 and 3 RF

## 2016-06-03 NOTE — Telephone Encounter (Signed)
Rx faxed to [facility].  

## 2016-06-13 ENCOUNTER — Other Ambulatory Visit: Payer: Self-pay | Admitting: Internal Medicine

## 2016-06-20 ENCOUNTER — Other Ambulatory Visit: Payer: Self-pay | Admitting: Internal Medicine

## 2016-06-27 ENCOUNTER — Encounter (HOSPITAL_COMMUNITY): Payer: Medicare Other

## 2016-06-29 ENCOUNTER — Other Ambulatory Visit: Payer: Self-pay | Admitting: Internal Medicine

## 2016-07-15 ENCOUNTER — Encounter (HOSPITAL_COMMUNITY): Payer: Medicare Other

## 2016-07-16 ENCOUNTER — Ambulatory Visit (HOSPITAL_COMMUNITY)
Admission: RE | Admit: 2016-07-16 | Discharge: 2016-07-16 | Disposition: A | Discharge status: Home / Self Care | Payer: Medicare Other | Source: Ambulatory Visit | Attending: Cardiology | Admitting: Cardiology

## 2016-07-16 DIAGNOSIS — I701 Atherosclerosis of renal artery: Secondary | ICD-10-CM | POA: Insufficient documentation

## 2016-07-16 DIAGNOSIS — I150 Renovascular hypertension: Secondary | ICD-10-CM | POA: Insufficient documentation

## 2016-07-16 DIAGNOSIS — I774 Celiac artery compression syndrome: Secondary | ICD-10-CM | POA: Diagnosis not present

## 2016-07-23 ENCOUNTER — Encounter: Payer: Self-pay | Admitting: Cardiovascular Disease

## 2016-07-23 NOTE — Telephone Encounter (Signed)
This encounter was created in error - please disregard.

## 2016-07-23 NOTE — Telephone Encounter (Signed)
Dustin Ashley is returning your call, thanks.

## 2016-08-05 ENCOUNTER — Ambulatory Visit (INDEPENDENT_AMBULATORY_CARE_PROVIDER_SITE_OTHER): Payer: Medicare Other | Admitting: Cardiovascular Disease

## 2016-08-05 ENCOUNTER — Encounter: Payer: Self-pay | Admitting: Cardiovascular Disease

## 2016-08-05 VITALS — BP 150/70 | HR 60 | Ht 70.0 in | Wt 207.0 lb

## 2016-08-05 DIAGNOSIS — I150 Renovascular hypertension: Secondary | ICD-10-CM | POA: Diagnosis not present

## 2016-08-05 DIAGNOSIS — I701 Atherosclerosis of renal artery: Secondary | ICD-10-CM | POA: Diagnosis not present

## 2016-08-05 NOTE — Progress Notes (Signed)
Cardiology Office Note Date:  08/05/2016   ID:  Dustin Ashley, DOB 08/03/1940, MRN [id number]  PCP:  [name], MD  Cardiologist:  Sherren Mocha, MD    Chief Complaint  Patient presents with  . Coronary Artery Disease    follow up 1 year     History of Present Illness: Dustin Ashley is a 76 y.o. male who presents for follow-up evaluation.  The patient is followed for coronary artery disease status post CABG and bilateral renal artery stenosis. The patient has a long history of cardiac disease dating back to initial diagnosis at approximately 76 years old.  He was managed medically for about 10 years, but developed severe angina with minimal activity. He was taken urgently for four-vessel bypass surgery in 1992. He has had no anginal symptoms since that time.  The patient is here with his wife today. He denies chest pain or pressure. He denies shortness of breath. States that when he tries to do vigorous physical activity he has to sit down and rest. However, denies specific cardiopulmonary symptoms as a limiting issue. He reports home blood pressures on average at 130/70. He has been compliant with his medications. He underwent recent renal arterial Doppler.   Past Medical History:  Diagnosis Date  . Allergic rhinitis   . Allergy    seasonal  . 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 CAD (coronary artery disease)   . Chronic renal insufficiency, stage II (mild)    Cr ~ 1.4, u/s 4-12 normal kidneys  . Gallstone pancreatitis 2015  . GERD (gastroesophageal reflux disease)   . HOH (hard of hearing)    has a hearing aid L, sees audiology rountinely  . Hyperglycemia    A1C 5.8 04-2010  . Hyperlipemia   . Hypertension   . Pain, joint, multiple sites    uses valium, occ uses for insomnia. uses for shoulder  pain  . Paroxysmal atrial fibrillation (HCC)   . Personal history of colonic polyps   . Pulmonary emboli ([place])    02-2014  . RAS (renal artery stenosis) ([place]) 05-2012   . Renal insufficiency       Past Surgical History:  Procedure Laterality Date  . CARDIAC CATHETERIZATION    . CHOLECYSTECTOMY N/A 02/15/2014   Procedure: LAPAROSCOPIC CHOLECYSTECTOMY with IOC;  Surgeon: Gayland Curry, MD;  Location: [facility];  Service: General;  Laterality: N/A;  . CORONARY ARTERY BYPASS GRAFT  1995  . ESOPHAGOGASTRODUODENOSCOPY N/A 03/02/2014   Procedure: ESOPHAGOGASTRODUODENOSCOPY (EGD);  Surgeon: Ladene Artist, MD;  [facility] ENDOSCOPY;  Service: Endoscopy;  Laterality: N/A;  sarah/leone    Current Outpatient Prescriptions  Medication Sig Dispense Refill  . acetaminophen (TYLENOL) 325 MG tablet Take 2 tablets (650 mg total) by mouth every 6 (six) hours as needed for mild pain, moderate pain, fever or headache.    Marland Kitchen amLODipine (NORVASC) 10 MG tablet Take 1 tablet (10 mg total) by mouth daily. 90 tablet 1  . aspirin 81 MG tablet Take 81 mg by mouth daily.    Marland Kitchen atenolol (TENORMIN) 50 MG tablet Take 1.5 tablets (75 mg total) by mouth daily. 45 tablet 2  . atorvastatin (LIPITOR) 20 MG tablet Take 1 tablet (20 mg total) by mouth daily. 90 tablet 1  . benazepril (LOTENSIN) 20 MG tablet Take 1 tablet (20 mg total) by mouth 2 (two) times daily. 180 tablet 1  . diazepam (VALIUM) 10 MG tablet Take 1 tablet (10 mg total) by mouth every 12 (twelve)  hours as needed for anxiety. 60 tablet 3  . esomeprazole (NEXIUM) 40 MG capsule Take 40 mg by mouth daily as needed (acid reflux or indigestion). Reported on 08/24/2015    . fexofenadine (ALLEGRA) 180 MG tablet Take 180 mg by mouth daily.      . niacin (NIASPAN) 500 MG CR tablet Take 500 mg by mouth 2 (two) times daily. AT BEDTIME    . ondansetron (ZOFRAN ODT) 4 MG disintegrating tablet Take 1 tablet (4 mg total) by mouth every 6 (six) hours as needed for nausea. 10 tablet 0  . polyethylene glycol (MIRALAX / GLYCOLAX) packet Take 17 g by mouth daily as needed for mild constipation. Reported on 08/11/2015     No current facility-administered medications for this  visit.     Allergies:   Hydrocodone-acetaminophen and Tramadol hcl   Social History:  The patient  reports that he quit smoking about 40 years ago. He has never used smokeless tobacco. He reports that he does not drink alcohol or use drugs.   Family History:  The patient's family history includes Heart attack in his brother; Hypertension in his father.    ROS:  Please see the history of present illness.  Otherwise, review of systems is positive for Leg swelling, hearing loss, back pain, easy bruising, snoring, difficulty urinating.  All other systems are reviewed and negative.    PHYSICAL EXAM: VS:  BP (!) 150/70   Pulse 60   Ht 5\' 10"  (1.778 m)   Wt 207 lb (93.9 kg)   BMI 29.70 kg/m  , BMI Body mass index is 29.7 kg/m. GEN: Well nourished, well developed, in no acute distress  HEENT: normal  Neck: no JVD, no masses. No carotid bruits Cardiac: RRR without murmur or gallop                Respiratory:  clear to auscultation bilaterally, normal work of breathing GI: soft, nontender, nondistended, + BS MS: no deformity or atrophy  Ext: no pretibial edema, pedal pulses 2+= bilaterally Skin: warm and dry, no rash Neuro:  Strength and sensation are intact Psych: euthymic mood, full affect  EKG:  EKG is not ordered today. EKG from 03/31/2016 reviewed: This demonstrates normal sinus rhythm 62 bpm, within normal limits.  Recent Labs: 08/25/2015: TSH 1.79 03/27/2016: ALT 20; BUN 19; Creatinine, Ser 1.02; Hemoglobin 15.0; Platelets 161; Potassium 3.6; Sodium 135   Lipid Panel     Component Value Date/Time   CHOL 94 08/25/2015 0942   TRIG 96.0 08/25/2015 0942   TRIG 92 06/26/2006 1042   HDL 29.90 (L) 08/25/2015 0942   CHOLHDL 3 08/25/2015 0942   VLDL 19.2 08/25/2015 0942   LDLCALC 45 08/25/2015 0942      Wt Readings from Last 3 Encounters:  08/05/16 207 lb (93.9 kg)  03/27/16 200 lb (90.7 kg)  03/27/16 200 lb (90.7 kg)     Cardiac Studies Reviewed: Renal arterial Doppler:  Normal caliber abdominal aorta, normal bilateral kidney size 11 cm on the right, 10.5 cm on the left. Stable greater than 60% bilateral renal artery stenosis with peak velocities 397 cm/s on the right and 404 cm/s on the left.  ASSESSMENT AND PLAN: 1.  Renal artery stenosis, bilateral: Stable findings on most recent renal arterial Doppler. Normal kidney size bilaterally. Normal renal function with creatinine 1.1 mg/dL. Continue medical therapy. Repeat renal arterial duplex in one year prior to his office visit.  2. Coronary artery disease status post remote CABG: Appears to  be stable. No anginal symptoms. We talked extensively about symptoms to watch out for. Also advised that if his exercise tolerance decreases at all I would be inclined to at least pursue a nuclear scan to assess his ischemic burden. He will keep an eye on his symptoms. He is beginning to work out regularly at [employer].  3. Renovascular hypertension: Blood pressure is elevated today, but home readings have been in a good range. We'll continue same therapy.  4. Hyperlipidemia: Most recent lipids are reviewed. He is treated with atorvastatin. Lipids are at goal.  5. 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 Continue medical management.  Current medicines are reviewed with the patient today.  The patient does not have concerns regarding medicines.  Labs/ tests ordered today include:  No orders of the defined types were placed in this encounter.   Disposition:   FU one year with a renal arterial Doppler prior to this.  Deatra James, MD  08/05/2016 2:03 PM    [address], [place], [place]  [address] Phone: [phone]; Fax: [phone]

## 2016-08-05 NOTE — Patient Instructions (Signed)
Medication Instructions:  Your physician recommends that you continue on your current medications as directed. Please refer to the Current Medication list given to you today.  Labwork: No new orders.   Testing/Procedures: Your physician has requested that you have a renal artery duplex in 1 YEAR. During this test, an ultrasound is used to evaluate blood flow to the kidneys. Allow one hour for this exam. Do not eat after midnight the day before and avoid carbonated beverages. Take your medications as you usually do.  Follow-Up: Your physician wants you to follow-up in: 1 YEAR with Dr Cooper.  You will receive a reminder letter in the mail two months in advance. If you don't receive a letter, please call our office to schedule the follow-up appointment.   Any Other Special Instructions Will Be Listed Below (If Applicable).     If you need a refill on your cardiac medications before your next appointment, please call your pharmacy.   

## 2016-08-27 ENCOUNTER — Encounter: Payer: Self-pay | Admitting: Internal Medicine

## 2016-08-27 ENCOUNTER — Ambulatory Visit (INDEPENDENT_AMBULATORY_CARE_PROVIDER_SITE_OTHER): Payer: Medicare Other | Admitting: Internal Medicine

## 2016-08-27 VITALS — BP 134/78 | HR 56 | Temp 97.5°F | Resp 14 | Ht 70.0 in | Wt 202.4 lb

## 2016-08-27 DIAGNOSIS — R739 Hyperglycemia, unspecified: Secondary | ICD-10-CM | POA: Diagnosis not present

## 2016-08-27 DIAGNOSIS — Z79899 Other long term (current) drug therapy: Secondary | ICD-10-CM | POA: Diagnosis not present

## 2016-08-27 DIAGNOSIS — Z0000 Encounter for general adult medical examination without abnormal findings: Secondary | ICD-10-CM

## 2016-08-27 LAB — CBC WITH DIFFERENTIAL/PLATELET
BASOS PCT: 0.9 % (ref 0.0–3.0)
Basophils Absolute: 0.1 10*3/uL (ref 0.0–0.1)
Eosinophils Absolute: 0.3 10*3/uL (ref 0.0–0.7)
Eosinophils Relative: 3.3 % (ref 0.0–5.0)
HEMATOCRIT: 45.2 % (ref 39.0–52.0)
Hemoglobin: 15.3 g/dL (ref 13.0–17.0)
LYMPHS ABS: 2.4 10*3/uL (ref 0.7–4.0)
LYMPHS PCT: 27 % (ref 12.0–46.0)
MCHC: 33.9 g/dL (ref 30.0–36.0)
MCV: 86.2 fl (ref 78.0–100.0)
Monocytes Absolute: 0.9 10*3/uL (ref 0.1–1.0)
Monocytes Relative: 10.2 % (ref 3.0–12.0)
NEUTROS ABS: 5.2 10*3/uL (ref 1.4–7.7)
NEUTROS PCT: 58.6 % (ref 43.0–77.0)
PLATELETS: 162 10*3/uL (ref 150.0–400.0)
RBC: 5.24 Mil/uL (ref 4.22–5.81)
RDW: 13.7 % (ref 11.5–15.5)
WBC: 8.8 10*3/uL (ref 4.0–10.5)

## 2016-08-27 LAB — HEMOGLOBIN A1C: Hgb A1c MFr Bld: 5.8 % (ref 4.6–6.5)

## 2016-08-27 LAB — BASIC METABOLIC PANEL
BUN: 20 mg/dL (ref 6–23)
CHLORIDE: 107 meq/L (ref 96–112)
CO2: 29 mEq/L (ref 19–32)
CREATININE: 1.22 mg/dL (ref 0.40–1.50)
Calcium: 9.4 mg/dL (ref 8.4–10.5)
GFR: 61.41 mL/min (ref >60.00)
Glucose, Bld: 113 mg/dL — ABNORMAL HIGH (ref 70–99)
POTASSIUM: 4.4 meq/L (ref 3.5–5.1)
SODIUM: 140 meq/L (ref 135–145)

## 2016-08-27 LAB — LIPID PANEL
CHOLESTEROL: 110 mg/dL (ref 0–200)
HDL: 34.2 mg/dL — AB (ref >39.00)
LDL Cholesterol: 47 mg/dL (ref 0–99)
NonHDL: 75.33
TRIGLYCERIDES: 142 mg/dL (ref 0.0–149.0)
Total CHOL/HDL Ratio: 3
VLDL: 28.4 mg/dL (ref 0.0–40.0)

## 2016-08-27 NOTE — Progress Notes (Signed)
Pre visit review using our clinic review tool, if applicable. No additional management support is needed unless otherwise documented below in the visit note. 

## 2016-08-27 NOTE — Patient Instructions (Signed)
GO TO THE LAB : Get the blood work  and a urine sample for a UDS   GO TO THE FRONT DESK Schedule your next appointment for a  checkup in 6 months  Also schedule a visit with one of our nurses: Medicare wellness   Check the  blood pressure 4 or 5 times a week  Be sure your blood pressure is between 110/65 and  145/85.  if it is consistently higher or lower, let me know

## 2016-08-27 NOTE — Assessment & Plan Note (Signed)
PLAN: Prediabetes: Doing well with diet and exercise, check A1c HTN: Occasional high readings in the afternoons (asx), his BP cuff may not be well calibrated, plans to get a new one, continue checking. Continue Lotensin, Tenormin, amlodipine.  Renal artery stenosis, CAD, saw cardiology last month , felt to be stable. 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 The IVC and renal veins are patent.f/u 1 year Went to the ER a few months ago with abdominal pain, no further symptoms, workup was negative except for elevated WBC. Hyperlipidemia: On statins and niacin, checking labs. Consider stop niacin.  RTC 6 months

## 2016-08-27 NOTE — Progress Notes (Signed)
Subjective:    Patient ID: Dustin Ashley, male    DOB: 1940-08-10, 76 y.o.   MRN: [id number]  DOS:  08/27/2016 Type of visit - description : CPX Interval history: No major concerns. BP -when he went to visit cardiology-  was slightly elevated, he decided to increase his exercise, going to the [facility] 3 times a week. Ambulatory BPs usually 110, 127. In the afternoon can go as high as 150. He is completely asymptomatic whether the BPs in the low or high side. Today his BP cuff  read 150, ours 134 consequently his cuff may not be well-calibrated .   Review of Systems  Constitutional: No fever. No chills. No unexplained wt changes. No unusual sweats  HEENT: No dental problems, no ear discharge, no facial swelling, no voice changes. No eye discharge, no eye  redness , no  intolerance to light   Respiratory: No wheezing , no  difficulty breathing. No cough , no mucus production  Cardiovascular: No CP, mild lower extremity edema, at baseline.no   Palpitations  GI: no nausea, no vomiting, no diarrhea , no  abdominal pain.  No blood in the stools. No dysphagia, no odynophagia    Endocrine: No polyphagia, no polyuria , no polydipsia  GU: No dysuria, gross hematuria, difficulty urinating. No urinary urgency, no frequency.  Musculoskeletal: No joint swellings or unusual aches or pains  Skin: No change in the color of the skin, palor , no  Rash  Allergic, immunologic: No environmental allergies , no  food allergies  Neurological: No dizziness no  syncope. No headaches. No diplopia, no slurred, no slurred speech, no motor deficits, no facial  Numbness  Hematological: No enlarged lymph nodes, no easy bruising , no unusual bleedings  Psychiatry: No suicidal ideas, no hallucinations, no beavior problems, no confusion.  No unusual/severe anxiety, no depression  Past Medical History:  Diagnosis Date  . Allergic rhinitis   . Allergy    seasonal  . 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 CAD (coronary artery  disease)   . Chronic renal insufficiency, stage II (mild)    Cr ~ 1.4, u/s 4-12 normal kidneys  . Gallstone pancreatitis 2015  . GERD (gastroesophageal reflux disease)   . HOH (hard of hearing)    has a hearing aid L, sees audiology rountinely  . Hyperglycemia    A1C 5.8 04-2010  . Hyperlipemia   . Hypertension   . Pain, joint, multiple sites    uses valium, occ uses for insomnia. uses for shoulder  pain  . Paroxysmal atrial fibrillation (HCC)   . Personal history of colonic polyps   . Pulmonary emboli ([place])    02-2014  . RAS (renal artery stenosis) ([place]) 05-2012   . Renal insufficiency     Past Surgical History:  Procedure Laterality Date  . CARDIAC CATHETERIZATION    . CHOLECYSTECTOMY N/A 02/15/2014   Procedure: LAPAROSCOPIC CHOLECYSTECTOMY with IOC;  Surgeon: Gayland Curry, MD;  Location: [facility];  Service: General;  Laterality: N/A;  . CORONARY ARTERY BYPASS GRAFT  1995  . ESOPHAGOGASTRODUODENOSCOPY N/A 03/02/2014   Procedure: ESOPHAGOGASTRODUODENOSCOPY (EGD);  Surgeon: Ladene Artist, MD;  Location: [facility] ENDOSCOPY;  Service: Endoscopy;  Laterality: N/A;  sarah/leone    Social History   Social History  . Marital status: Married    Spouse name: N/A  . Number of children: 1  . Years of education: N/A   Occupational History  . retired, delivery truck Geophysicist/field seismologist    Social History Main  Topics  . Smoking status: Former Smoker    Quit date: 03/13/1976  . Smokeless tobacco: Never Used  . Alcohol use No  . Drug use: No  . Sexual activity: Not on file   Other Topics Concern  . Not on file   Social History Narrative   Born in [place], has a large extended family, oldest of several brothers-sister          Family History  Problem Relation Age of Onset  . Hypertension Father   . Heart attack Brother     2 brothers, CABG  . Diabetes Neg Hx   . Colon cancer Neg Hx   . Prostate cancer Neg Hx     colon, prostate     Allergies as of 08/27/2016      Reactions    Hydrocodone-acetaminophen Other (See Comments)   REACTION: bladder obstruction   Tramadol Hcl Other (See Comments)   REACTION: bladder obstruction      Medication List       Accurate as of 08/27/16  5:48 PM. Always use your most recent med list.          acetaminophen 325 MG tablet Commonly known as:  TYLENOL Take 2 tablets (650 mg total) by mouth every 6 (six) hours as needed for mild pain, moderate pain, fever or headache.   amLODipine 10 MG tablet Commonly known as:  NORVASC Take 1 tablet (10 mg total) by mouth daily.   aspirin 81 MG tablet Take 81 mg by mouth daily.   atenolol 50 MG tablet Commonly known as:  TENORMIN Take 1.5 tablets (75 mg total) by mouth daily.   atorvastatin 20 MG tablet Commonly known as:  LIPITOR Take 1 tablet (20 mg total) by mouth daily.   benazepril 20 MG tablet Commonly known as:  LOTENSIN Take 1 tablet (20 mg total) by mouth 2 (two) times daily.   diazepam 10 MG tablet Commonly known as:  VALIUM Take 1 tablet (10 mg total) by mouth every 12 (twelve) hours as needed for anxiety.   esomeprazole 40 MG capsule Commonly known as:  NEXIUM Take 40 mg by mouth daily as needed (acid reflux or indigestion). Reported on 08/24/2015   fexofenadine 180 MG tablet Commonly known as:  ALLEGRA Take 180 mg by mouth daily.   niacin 500 MG CR tablet Commonly known as:  NIASPAN Take 500 mg by mouth 2 (two) times daily. AT BEDTIME   polyethylene glycol packet Commonly known as:  MIRALAX / GLYCOLAX Take 17 g by mouth daily as needed for mild constipation. Reported on 08/11/2015          Objective:   Physical Exam BP 134/78 (BP Location: Left Arm, Patient Position: Sitting, Cuff Size: Normal)   Pulse (!) 56   Temp 97.5 F (36.4 C) (Oral)   Resp 14   Ht 5\' 10"  (1.778 m)   Wt 202 lb 6 oz (91.8 kg)   SpO2 98%   BMI 29.04 kg/m  General:   Well developed, well nourished . NAD.  Neck: No  thyromegaly  HEENT:  Normocephalic . Face symmetric,  atraumatic Lungs:  CTA B Normal respiratory effort, no intercostal retractions, no accessory muscle use. Heart: RRR,  no murmur.  No pretibial edema bilaterally  Abdomen:  Not distended, soft, non-tender. No rebound or rigidity.  No bruit Skin: Exposed areas without rash. Not pale. Not jaundice Neurologic:  alert & oriented X3.  Speech normal, gait appropriate for age and unassisted Strength symmetric and  appropriate for age.  Psych: Cognition and judgment appear intact.  Cooperative with normal attention span and concentration.  Behavior appropriate. No anxious or depressed appearing.     Assessment & Plan:    Assessment Prediabetes HTN Hyperlipidemia GERD, h/o Candida esophagitis 02-2014, nexium prn Insomnia, on diazepam MSK --  occ. 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 Continue Lotensin, Tenormin, amlodipine.  Renal artery stenosis, CAD, saw cardiology last month , felt to be stable. 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 The IVC and renal veins are patent.f/u 1 year Went to the ER a few months ago with abdominal pain, no further symptoms, workup was negative except for elevated WBC. Hyperlipidemia: On statins and niacin, checking labs. Consider stop niacin.  RTC 6  months

## 2016-08-27 NOTE — Assessment & Plan Note (Addendum)
Td 2008; PNM shot 2008; prevnar 2015; had a flu shot  shingles vaccine -- strongly declined  cscope 02-2015, next 3 years  Prostate cancer screening:  previous PSAs normal. See previous note, no screening this year  Diet and exercise discussed.

## 2016-09-12 ENCOUNTER — Telehealth: Payer: Self-pay

## 2016-09-12 NOTE — Telephone Encounter (Signed)
UDS: 08/27/2016  Positive for Diazepam   Low risk per PCP 09/12/2016

## 2016-09-26 ENCOUNTER — Other Ambulatory Visit: Payer: Self-pay | Admitting: Internal Medicine

## 2016-10-10 ENCOUNTER — Other Ambulatory Visit: Payer: Self-pay | Admitting: Internal Medicine

## 2016-12-05 ENCOUNTER — Other Ambulatory Visit: Payer: Self-pay | Admitting: Internal Medicine

## 2016-12-11 ENCOUNTER — Ambulatory Visit: Payer: Medicare Other | Admitting: *Deleted

## 2016-12-23 ENCOUNTER — Other Ambulatory Visit: Payer: Self-pay | Admitting: Internal Medicine

## 2016-12-31 ENCOUNTER — Telehealth: Payer: Self-pay | Admitting: Internal Medicine

## 2016-12-31 NOTE — Telephone Encounter (Addendum)
Spouse states nurse from [facility] came to home to conduct AWV around March and will fax over the report to [phone], informed patient PCP would prefer next year patient conducting AWV within the office, patient voiced understanding.

## 2017-01-07 DIAGNOSIS — L570 Actinic keratosis: Secondary | ICD-10-CM | POA: Diagnosis not present

## 2017-01-07 IMAGING — CT CT ABD-PELV W/ CM
2 of 5 series · 16 of 46 positions shown, 18 images · IV contrast (APPLIED)
Comparison: 02/26/2014

CLINICAL DATA: Abdominal pain and vomiting.

EXAM:
CT ABDOMEN AND PELVIS WITH CONTRAST
TECHNIQUE: Multidetector CT imaging of the abdomen and pelvis was performed
using the standard protocol following bolus administration of
intravenous contrast.
CONTRAST:  100mL 20B6Q3-6SS IOPAMIDOL (20B6Q3-6SS) INJECTION 61%

[Series 2: axial st · axial · 0.89mm/px · z∈[-430,-10]mm · 13 of 94 slices shown, 15 images]
[im 5/94  soft-tissue]
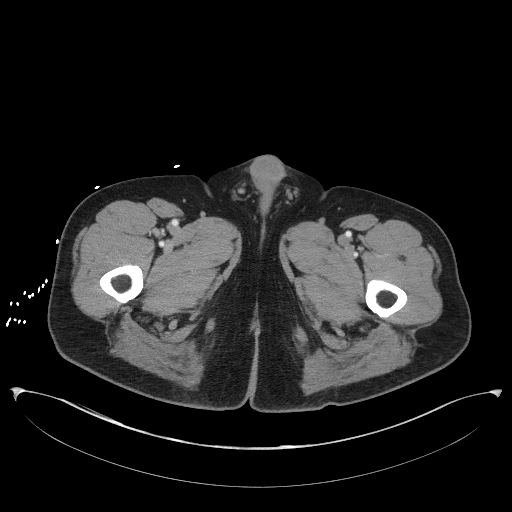
[im 5/94  bone]
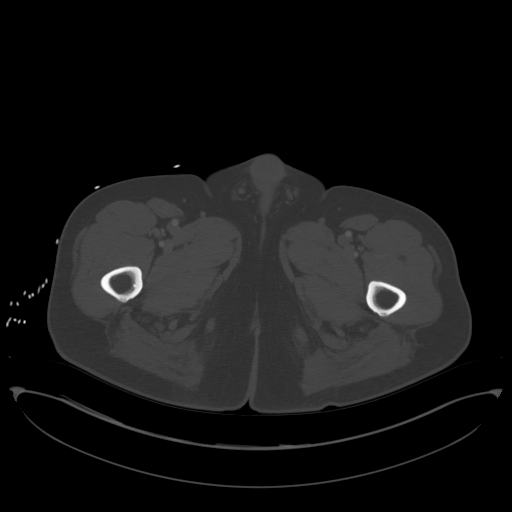
[im 14/94  soft-tissue]
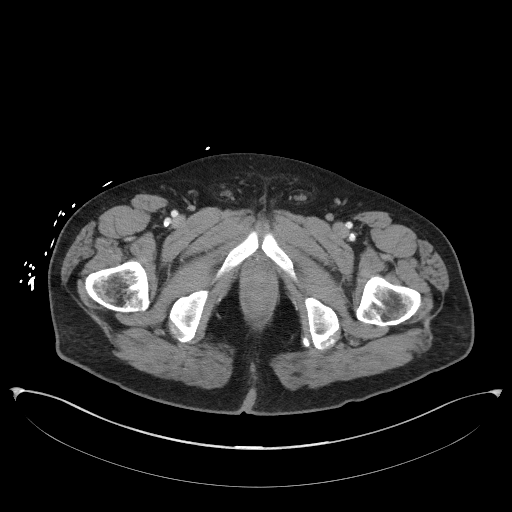
[im 19/94  soft-tissue]
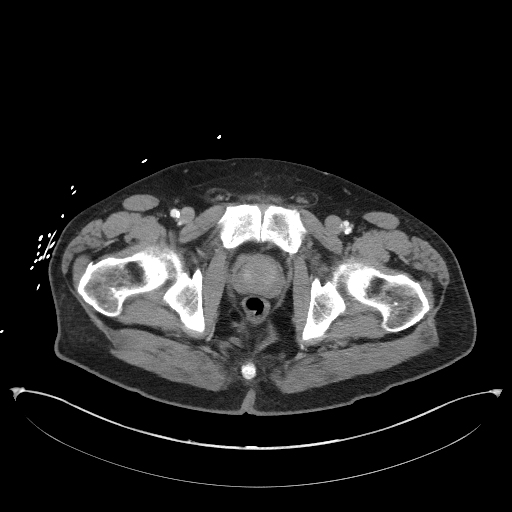
[im 28/94  soft-tissue]
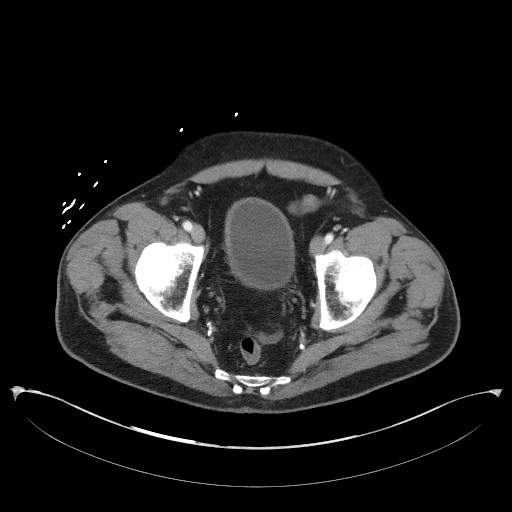
[im 33/94  soft-tissue]
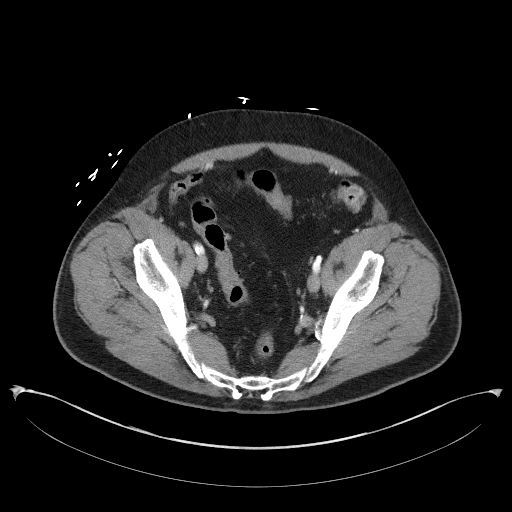
[im 42/94  soft-tissue]
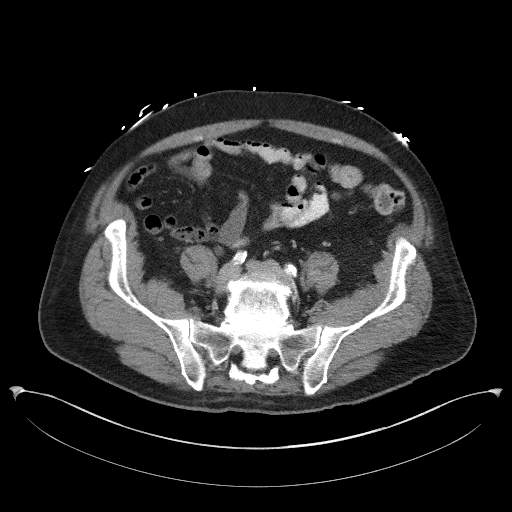
[im 47/94  soft-tissue]
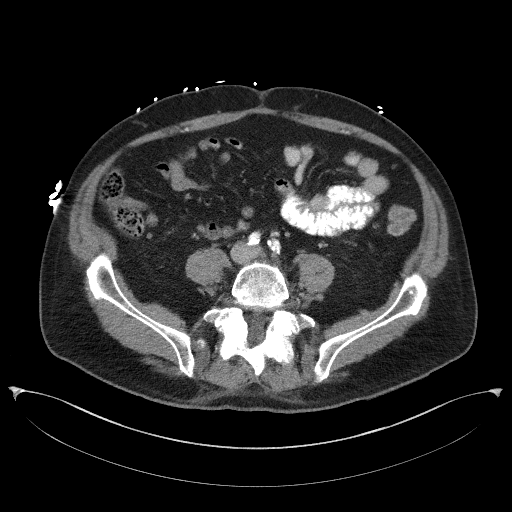
[im 52/94  soft-tissue]
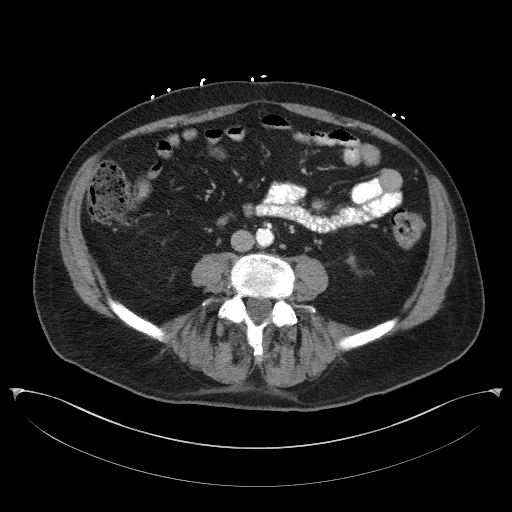
[im 61/94  soft-tissue]
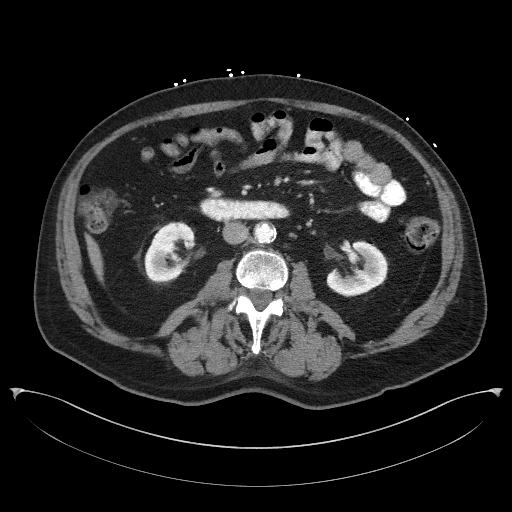
[im 61/94  bone]
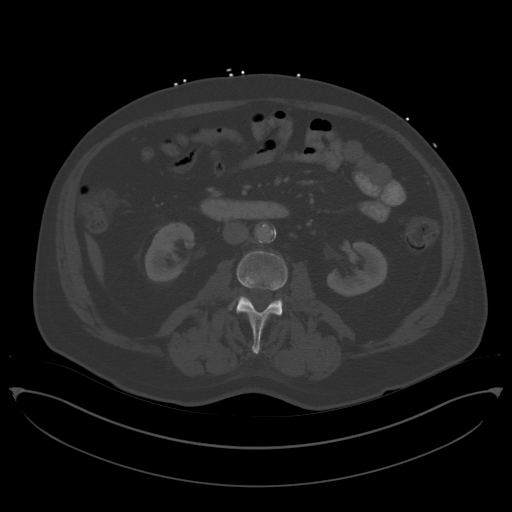
[im 66/94  soft-tissue]
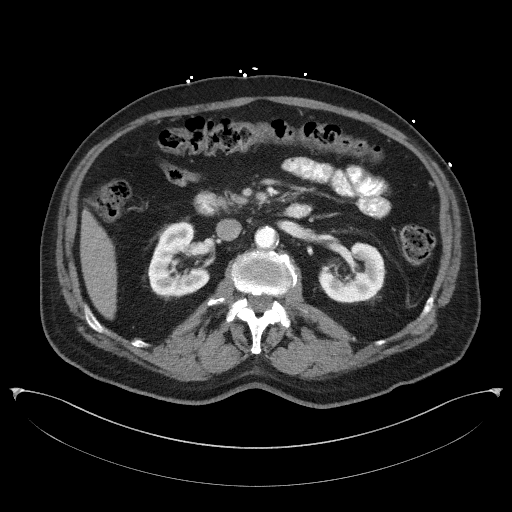
[im 75/94  soft-tissue]
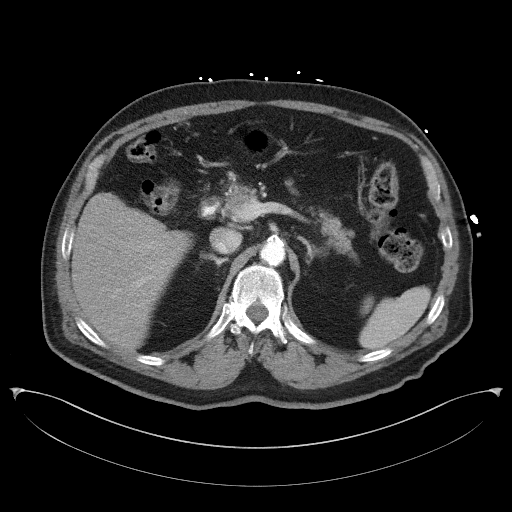
[im 80/94  soft-tissue]
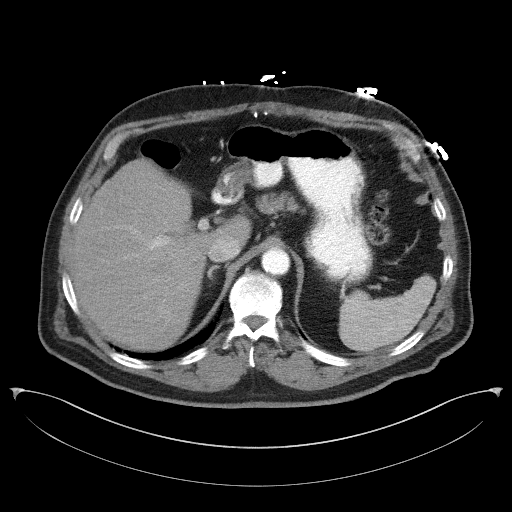
[im 89/94  soft-tissue]
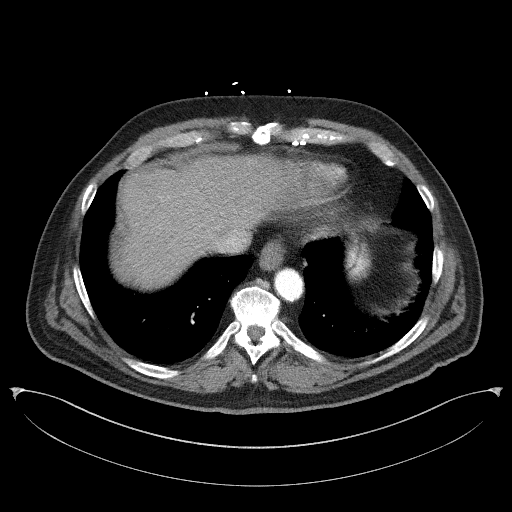

[Series 5: coronal st · coronal · 0.83mm/px · 3 of 107 slices shown]
[im 36/107  soft-tissue]
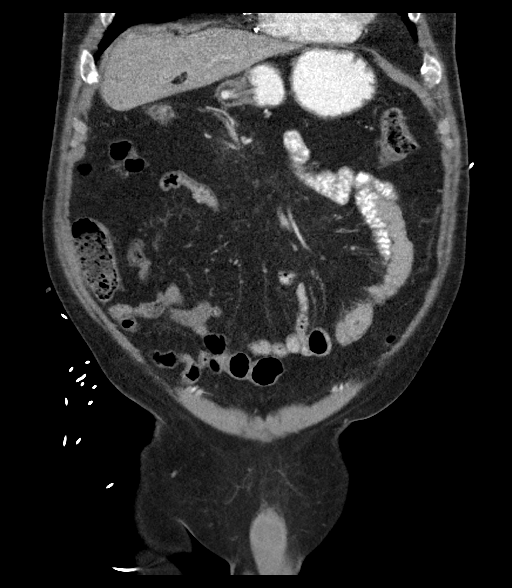
[im 48/107  soft-tissue]
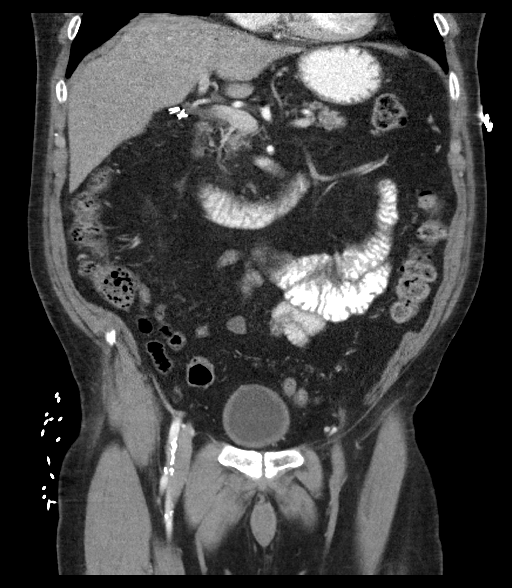
[im 59/107  soft-tissue]
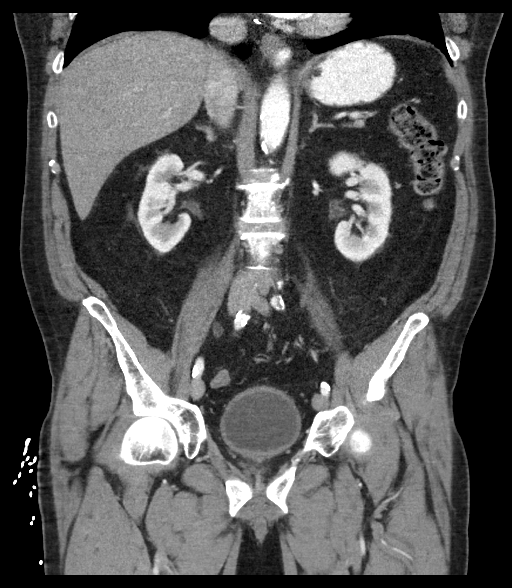

[16 of 46 positions shown; findings below may reference images not displayed]

FINDINGS: Lower chest: Status post CABG. No acute finding. Tiny sliding hiatal
hernia.

Hepatobiliary: Probable hepatic steatosis. No focal liver
abnormality.Cholecystectomy. Gallbladder fossa fluid collection seen
previously is resolved. No biliary dilatation.

Pancreas: Unremarkable.

Spleen: Unremarkable.

Adrenals/Urinary Tract: Negative adrenals. No hydronephrosis or
stone. Nonspecific bladder wall thickening.

Stomach/Bowel: No obstruction. No appendicitis. Few colonic
diverticula.

Vascular/Lymphatic: Advanced atherosclerotic calcification of the
aorta and branch vessels. There is high-grade celiac axis stenosis
but patent SMA and IMA without evidence of proximal flow limiting
stenosis. No hypertrophied peripancreatic arteries. Bilateral
proximal renal artery atheromatous narrowing. Severe stenosis of the
left common iliac. No evidence of acute vessel occlusion. No mass or
adenopathy.

Reproductive:Mild generalized enlargement of the prostate.

Other: No ascites or pneumoperitoneum.

Musculoskeletal: No acute abnormalities. Chronic bilateral L5 pars
defects with borderline grade 2 anterolisthesis and focally advanced
disc degeneration.
IMPRESSION: 1. No acute finding.
2. Extensive atherosclerosis with high-grade celiac, bilateral
renal, and left iliac stenoses.
3. Chronic L5 pars defects with anterolisthesis.

## 2017-01-22 ENCOUNTER — Telehealth: Payer: Self-pay | Admitting: Internal Medicine

## 2017-01-22 NOTE — Telephone Encounter (Signed)
Pt wife dropped off document to have on chart and for provider to see (yellow 2 pages front and back with Wellness Visit Info). Document put at front office tray.

## 2017-01-23 NOTE — Telephone Encounter (Signed)
Forwarded to provider/SLS 07/12

## 2017-02-19 DIAGNOSIS — M9901 Segmental and somatic dysfunction of cervical region: Secondary | ICD-10-CM | POA: Diagnosis not present

## 2017-02-19 DIAGNOSIS — M542 Cervicalgia: Secondary | ICD-10-CM | POA: Diagnosis not present

## 2017-02-21 DIAGNOSIS — M9901 Segmental and somatic dysfunction of cervical region: Secondary | ICD-10-CM | POA: Diagnosis not present

## 2017-02-21 DIAGNOSIS — M542 Cervicalgia: Secondary | ICD-10-CM | POA: Diagnosis not present

## 2017-02-24 DIAGNOSIS — M542 Cervicalgia: Secondary | ICD-10-CM | POA: Diagnosis not present

## 2017-02-24 DIAGNOSIS — M9901 Segmental and somatic dysfunction of cervical region: Secondary | ICD-10-CM | POA: Diagnosis not present

## 2017-02-26 DIAGNOSIS — M9901 Segmental and somatic dysfunction of cervical region: Secondary | ICD-10-CM | POA: Diagnosis not present

## 2017-02-26 DIAGNOSIS — M542 Cervicalgia: Secondary | ICD-10-CM | POA: Diagnosis not present

## 2017-02-27 ENCOUNTER — Encounter: Payer: Self-pay | Admitting: Internal Medicine

## 2017-02-27 ENCOUNTER — Ambulatory Visit (INDEPENDENT_AMBULATORY_CARE_PROVIDER_SITE_OTHER): Payer: Medicare Other | Admitting: Internal Medicine

## 2017-02-27 VITALS — BP 116/64 | HR 57 | Temp 97.8°F | Resp 14 | Ht 70.0 in | Wt 188.2 lb

## 2017-02-27 DIAGNOSIS — I10 Essential (primary) hypertension: Secondary | ICD-10-CM | POA: Diagnosis not present

## 2017-02-27 DIAGNOSIS — R1013 Epigastric pain: Secondary | ICD-10-CM

## 2017-02-27 LAB — COMPREHENSIVE METABOLIC PANEL
ALBUMIN: 3.8 g/dL (ref 3.5–5.2)
ALT: 18 U/L (ref 0–53)
AST: 19 U/L (ref 0–37)
Alkaline Phosphatase: 76 U/L (ref 39–117)
BUN: 24 mg/dL — ABNORMAL HIGH (ref 6–23)
CALCIUM: 9.5 mg/dL (ref 8.4–10.5)
CHLORIDE: 106 meq/L (ref 96–112)
CO2: 27 meq/L (ref 19–32)
CREATININE: 1.22 mg/dL (ref 0.40–1.50)
GFR: 61.33 mL/min (ref >60.00)
Glucose, Bld: 107 mg/dL — ABNORMAL HIGH (ref 70–99)
Potassium: 4.4 mEq/L (ref 3.5–5.1)
Sodium: 140 mEq/L (ref 135–145)
Total Bilirubin: 0.7 mg/dL (ref 0.2–1.2)
Total Protein: 6.6 g/dL (ref 6.0–8.3)

## 2017-02-27 MED ORDER — PREDNISONE 10 MG PO TABS: ORAL_TABLET | ORAL | 0 refills | Status: DC

## 2017-02-27 NOTE — Patient Instructions (Signed)
GO TO THE LAB : Get the blood work     GO TO THE FRONT DESK Schedule your next appointment for a  physical exam for February 2019   For  neck pain: Use heat or cold Tylenol as needed If you not gradually improving, take a low dose of prednisone. See prescription

## 2017-02-27 NOTE — Progress Notes (Signed)
Pre visit review using our clinic review tool, if applicable. No additional management support is needed unless otherwise documented below in the visit note. 

## 2017-02-27 NOTE — Progress Notes (Signed)
Subjective:    Patient ID: Dustin Ashley, male    DOB: [birth year], 76 y.o.   MRN: [id number]  DOS:  02/27/2017 Type of visit - description : rov Interval history: HTN: Good compliance of medication. Ambulatory BPs when he goes to the gym sometimes  In the low side ~100/50. At home usually 118/60. He denies any symptoms of low blood pressure. He is actually very active and has lost a significant amount of weight since he started to exercise at the [facility]. 2 weeks ago neck pain resurfaced, located at the upper neck, midline, no radiation. Denies lower or upper extremity numbness, no gait disturbance. Went to a chiropractor a few times, slightly better.   Wt Readings from Last 3 Encounters:  02/27/17 188 lb 4 oz (85.4 kg)  08/27/16 202 lb 6 oz (91.8 kg)  08/05/16 207 lb (93.9 kg)     Review of Systems Vision decrease, to have cataract surgery soon  Past Medical History:  Diagnosis Date  . Allergic rhinitis   . Allergy    seasonal  . 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 CAD (coronary artery disease)   . Chronic renal insufficiency, stage II (mild)    Cr ~ 1.4, u/s 4-12 normal kidneys  . Gallstone pancreatitis 2015  . GERD (gastroesophageal reflux disease)   . HOH (hard of hearing)    has a hearing aid L, sees audiology rountinely  . Hyperglycemia    A1C 5.8 04-2010  . Hyperlipemia   . Hypertension   . Pain, joint, multiple sites    uses valium, occ uses for insomnia. uses for shoulder  pain  . Paroxysmal atrial fibrillation (HCC)   . Personal history of colonic polyps   . Pulmonary emboli ([place])    02-2014  . RAS (renal artery stenosis) ([place]) 05-2012   . Renal insufficiency     Past Surgical History:  Procedure Laterality Date  . CARDIAC CATHETERIZATION    . CHOLECYSTECTOMY N/A 02/15/2014   Procedure: LAPAROSCOPIC CHOLECYSTECTOMY with IOC;  Surgeon: Gayland Curry, MD;  Location: [place];  Service: General;  Laterality: N/A;  . CORONARY ARTERY BYPASS GRAFT  1995  . ESOPHAGOGASTRODUODENOSCOPY  N/A 03/02/2014   Procedure: ESOPHAGOGASTRODUODENOSCOPY (EGD);  Surgeon: Ladene Artist, MD;  [facility] ENDOSCOPY;  Service: Endoscopy;  Laterality: N/A;  sarah/leone    Social History   Social History  . Marital status: Married    Spouse name: N/A  . Number of children: 1  . Years of education: N/A   Occupational History  . retired, delivery truck Geophysicist/field seismologist    Social History Main Topics  . Smoking status: Former Smoker    Quit date: 03/13/1976  . Smokeless tobacco: Never Used  . Alcohol use No  . Drug use: No  . Sexual activity: Not on file   Other Topics Concern  . Not on file   Social History Narrative   Born in [place], has a large extended family, oldest of several brothers-sister           Allergies as of 02/27/2017      Reactions   Hydrocodone-acetaminophen Other (See Comments)   REACTION: bladder obstruction   Tramadol Hcl Other (See Comments)   REACTION: bladder obstruction      Medication List       Accurate as of 02/27/17 11:59 PM. Always use your most recent med list.          acetaminophen 325 MG tablet Commonly known as:  TYLENOL Take 2  tablets (650 mg total) by mouth every 6 (six) hours as needed for mild pain, moderate pain, fever or headache.   amLODipine 10 MG tablet Commonly known as:  NORVASC Take 1 tablet (10 mg total) by mouth daily.   aspirin 81 MG tablet Take 81 mg by mouth daily.   atenolol 50 MG tablet Commonly known as:  TENORMIN Take 1.5 tablets (75 mg total) by mouth daily.   atorvastatin 20 MG tablet Commonly known as:  LIPITOR Take 1 tablet (20 mg total) by mouth daily.   benazepril 20 MG tablet Commonly known as:  LOTENSIN Take 1 tablet (20 mg total) by mouth 2 (two) times daily.   diazepam 10 MG tablet Commonly known as:  VALIUM Take 1 tablet (10 mg total) by mouth every 12 (twelve) hours as needed for anxiety.   esomeprazole 40 MG capsule Commonly known as:  NEXIUM Take 40 mg by mouth daily as needed (acid  reflux or indigestion). Reported on 08/24/2015   fexofenadine 180 MG tablet Commonly known as:  ALLEGRA Take 180 mg by mouth daily.   niacin 500 MG CR tablet Commonly known as:  NIASPAN Take 500 mg by mouth 2 (two) times daily. AT BEDTIME   polyethylene glycol packet Commonly known as:  MIRALAX / GLYCOLAX Take 17 g by mouth daily as needed for mild constipation. Reported on 08/11/2015   predniSONE 10 MG tablet Commonly known as:  DELTASONE 2 tabs a day x 5 days          Objective:   Physical Exam  Neck:     BP 116/64 (BP Location: Left Arm, Patient Position: Sitting, Cuff Size: Small)   Pulse (!) 57   Temp 97.8 F (36.6 C) (Oral)   Resp 14   Ht 5\' 10"  (1.778 m)   Wt 188 lb 4 oz (85.4 kg)   SpO2 96%   BMI 27.01 kg/m  General:   Well developed, well nourished . NAD.  HEENT:  Normocephalic . Face symmetric, atraumatic Neck: No TTP at the cervical spine. Range of motion slightly decreased throughout. Lungs:  CTA B Normal respiratory effort, no intercostal retractions, no accessory muscle use. Heart: RRR,  no murmur.  No pretibial edema bilaterally  Skin: Not pale. Not jaundice Neurologic:  alert & oriented X3.  Speech normal, gait appropriate for age and unassisted. Motor and DTRs symmetric. Psych--  Cognition and judgment appear intact.  Cooperative with normal attention span and concentration.  Behavior appropriate. No anxious or depressed appearing.      Assessment & Plan:    Assessment Prediabetes HTN Hyperlipidemia GERD, h/o Candida esophagitis 02-2014, nexium prn Insomnia, on diazepam MSK --  occ. neck pain, diazepam prn CKD  Renal US wnl 2012 CV: --CAD, CABG 1995 --Paroxysmal atrial fibrillation; (-) 3 week monitor ~07-2014  , not anticoag as off 02-2017  --Pulmonary emboli , DVT, provoked 02-2014 --RAS dx 2013 Low testosterone 11-2014 ED  HOH-- has aids H/o Bell palsy  H/ o gallbladder pancreatitis Sees dermatology  PLAN: Prediabetes:  Last A1c satisfactory, doing great with exercise, has lost weight. HTN: Continue with amlodipine, Tenormin, Lotensin. Check a CMP. BP sometimes low but he is completely asx. Recommend to call me if he goes less than 100 consistently or if he has symptoms. Neck pain: Has neck pain on and off, used diazepam but this time it didn't help. Has seen a chiropractor and feels is slightly better. Recommend to be very careful w/ chiropractor manipulation, states that he won't  let them "pop" the neck again. To  use local heat or cold compresses, Tylenol. If needed he will use a low dose of prednisone, see prescription. RTC CPX 08-2017

## 2017-02-28 DIAGNOSIS — M542 Cervicalgia: Secondary | ICD-10-CM | POA: Diagnosis not present

## 2017-02-28 DIAGNOSIS — M9901 Segmental and somatic dysfunction of cervical region: Secondary | ICD-10-CM | POA: Diagnosis not present

## 2017-02-28 NOTE — Assessment & Plan Note (Signed)
Prediabetes: Last A1c satisfactory, doing great with exercise, has lost weight. HTN: Continue with amlodipine, Tenormin, Lotensin. Check a CMP. BP sometimes low but he is completely asx. Recommend to call me if he goes less than 100 consistently or if he has symptoms. Neck pain: Has neck pain on and off, used diazepam but this time it didn't help. Has seen a chiropractor and feels is slightly better. Recommend to be very careful w/ chiropractor manipulation, states that he won't let them "pop" the neck again. To  use local heat or cold compresses, Tylenol. If needed he will use a low dose of prednisone, see prescription. RTC CPX 08-2017

## 2017-03-07 ENCOUNTER — Other Ambulatory Visit: Payer: Self-pay | Admitting: Internal Medicine

## 2017-03-07 NOTE — Telephone Encounter (Signed)
Rx faxed to [place].

## 2017-03-07 NOTE — Telephone Encounter (Signed)
OK.  Printed 

## 2017-03-07 NOTE — Telephone Encounter (Signed)
Pt is requesting refill on diazepam 10mg . Paz Pt.  Last OV: 02/27/2017 Last Fill: 06/03/2016 #60 and 3RF UDS; 08/27/2016 Low risk  Please advise in PCP absence.

## 2017-03-22 ENCOUNTER — Other Ambulatory Visit: Payer: Self-pay | Admitting: Internal Medicine

## 2017-04-03 DIAGNOSIS — H25043 Posterior subcapsular polar age-related cataract, bilateral: Secondary | ICD-10-CM | POA: Diagnosis not present

## 2017-04-03 DIAGNOSIS — H2513 Age-related nuclear cataract, bilateral: Secondary | ICD-10-CM | POA: Diagnosis not present

## 2017-04-03 DIAGNOSIS — H2511 Age-related nuclear cataract, right eye: Secondary | ICD-10-CM | POA: Diagnosis not present

## 2017-04-03 DIAGNOSIS — H02839 Dermatochalasis of unspecified eye, unspecified eyelid: Secondary | ICD-10-CM | POA: Diagnosis not present

## 2017-04-03 DIAGNOSIS — H25013 Cortical age-related cataract, bilateral: Secondary | ICD-10-CM | POA: Diagnosis not present

## 2017-05-16 DIAGNOSIS — H2511 Age-related nuclear cataract, right eye: Secondary | ICD-10-CM | POA: Diagnosis not present

## 2017-05-16 DIAGNOSIS — H2512 Age-related nuclear cataract, left eye: Secondary | ICD-10-CM | POA: Diagnosis not present

## 2017-05-30 DIAGNOSIS — H2512 Age-related nuclear cataract, left eye: Secondary | ICD-10-CM | POA: Diagnosis not present

## 2017-06-04 ENCOUNTER — Other Ambulatory Visit: Payer: Self-pay | Admitting: Family Medicine

## 2017-06-09 NOTE — Telephone Encounter (Signed)
Rx printed, awaiting MD signature.  

## 2017-06-09 NOTE — Telephone Encounter (Signed)
#  30 and 1 refill

## 2017-06-09 NOTE — Telephone Encounter (Signed)
Rx faxed to [place].

## 2017-06-09 NOTE — Telephone Encounter (Signed)
Pt is requesting refill on Diazepam 10mg .   Last OV: 02/27/2017 Last Fill: 03/07/2017 #60 and 0RF UDS: 08/27/2016 Low risk  NCCR printed; no issues noted  Please advise.

## 2017-06-18 ENCOUNTER — Other Ambulatory Visit: Payer: Self-pay | Admitting: Internal Medicine

## 2017-07-02 ENCOUNTER — Other Ambulatory Visit: Payer: Self-pay | Admitting: Internal Medicine

## 2017-08-05 ENCOUNTER — Ambulatory Visit (HOSPITAL_COMMUNITY)
Admission: RE | Admit: 2017-08-05 | Discharge: 2017-08-05 | Disposition: A | Discharge status: Home / Self Care | Payer: Medicare Other | Source: Ambulatory Visit | Attending: Cardiology | Admitting: Cardiology

## 2017-08-05 DIAGNOSIS — I701 Atherosclerosis of renal artery: Secondary | ICD-10-CM | POA: Insufficient documentation

## 2017-08-05 DIAGNOSIS — I2510 Atherosclerotic heart disease of native coronary artery without angina pectoris: Secondary | ICD-10-CM | POA: Diagnosis not present

## 2017-08-05 DIAGNOSIS — E785 Hyperlipidemia, unspecified: Secondary | ICD-10-CM | POA: Diagnosis not present

## 2017-08-05 DIAGNOSIS — I150 Renovascular hypertension: Secondary | ICD-10-CM | POA: Diagnosis not present

## 2017-08-05 DIAGNOSIS — Z87891 Personal history of nicotine dependence: Secondary | ICD-10-CM | POA: Diagnosis not present

## 2017-08-13 ENCOUNTER — Telehealth: Payer: Self-pay | Admitting: Cardiovascular Disease

## 2017-08-13 NOTE — Telephone Encounter (Signed)
Patient calling,  Patient states that he is returning your call from yesterday.

## 2017-08-13 NOTE — Telephone Encounter (Signed)
Informed patient of results and verbal understanding expressed.  Patient has been scheduled 2/15 for annual follow-up. He was grateful for call and agrees with treatment plan.

## 2017-08-13 NOTE — Telephone Encounter (Signed)
-----   Message from Sherren Mocha, MD sent at 08/10/2017  6:47 PM EST ----- Stable bilateral severe renal artery stenosis with normal kidney size. Pt due for annual follow-up. Anticipate continued surveillance and medical therapy.

## 2017-08-29 ENCOUNTER — Ambulatory Visit: Payer: Medicare Other | Admitting: Cardiovascular Disease

## 2017-08-29 ENCOUNTER — Encounter: Payer: Self-pay | Admitting: Cardiovascular Disease

## 2017-08-29 VITALS — BP 138/70 | HR 53 | Ht 70.0 in | Wt 187.8 lb

## 2017-08-29 DIAGNOSIS — I2510 Atherosclerotic heart disease of native coronary artery without angina pectoris: Secondary | ICD-10-CM | POA: Diagnosis not present

## 2017-08-29 DIAGNOSIS — I701 Atherosclerosis of renal artery: Secondary | ICD-10-CM

## 2017-08-29 NOTE — Patient Instructions (Signed)
Medication Instructions:  Your provider recommends that you continue on your current medications as directed. Please refer to the Current Medication list given to you today.    Labwork: None  Testing/Procedures: Your physician has requested that you have a renal artery duplex in one year. During this test, an ultrasound is used to evaluate blood flow to the kidneys. Allow one hour for this exam. Do not eat after midnight the day before and avoid carbonated beverages. Take your medications as you usually do.  Follow-Up: Your provider wants you to follow-up in: 1 year with Dr. Burt Knack. You will receive a reminder letter in the mail two months in advance. If you don't receive a letter, please call our office to schedule the follow-up appointment.    Any Other Special Instructions Will Be Listed Below (If Applicable).     If you need a refill on your cardiac medications before your next appointment, please call your pharmacy.

## 2017-08-29 NOTE — Progress Notes (Signed)
Cardiology Office Note Date:  08/29/2017   ID:  RUDDY SWIRE, DOB [birth year], MRN [id number]  PCP:  Colon Branch, MD  Cardiologist:  Sherren Mocha, MD    Chief Complaint  Patient presents with  . Follow-up  . Coronary Artery Disease     History of Present Illness: Dustin Ashley is a 77 y.o. male who presents for follow-up of renal artery stenosis.  The patient has coronary artery disease with history of CABG.  His coronary artery disease dates back to when he was about 77 years old and he was managed medically for chronic angina for approximately 10 years but ultimately developed refractory angina and required multivessel CABG in 1992.  He is done well since that time with no recurrence of angina.  He has been followed medically for renal artery stenosis and has bilateral severe renal artery stenosis but has had well-controlled blood pressure and normal renal function so no indication for renal artery stenting.  The patient is here with his wife today.  He is doing very well.  After I saw him last year he joined the [facility] and has been exercising regularly.  He walks for about an hour on the treadmill.  It he is lost 20 pounds.  His energy level is good and he has no symptoms associated with physical exertion.  He specifically denies chest pain, shortness of breath, lightheadedness, or weakness.  Past Medical History:  Diagnosis Date  . Allergic rhinitis   . Allergy    seasonal  . 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 CAD (coronary artery disease)   . Chronic renal insufficiency, stage II (mild)    Cr ~ 1.4, u/s 4-12 normal kidneys  . Gallstone pancreatitis 2015  . GERD (gastroesophageal reflux disease)   . HOH (hard of hearing)    has a hearing aid L, sees audiology rountinely  . Hyperglycemia    A1C 5.8 04-2010  . Hyperlipemia   . Hypertension   . Pain, joint, multiple sites    uses valium, occ uses for insomnia. uses for shoulder  pain  . Paroxysmal atrial fibrillation (HCC)   . Personal  history of colonic polyps   . Pulmonary emboli ([place])    02-2014  . RAS (renal artery stenosis) ([place]) 05-2012   . Renal insufficiency     Past Surgical History:  Procedure Laterality Date  . CARDIAC CATHETERIZATION    . CHOLECYSTECTOMY N/A 02/15/2014   Procedure: LAPAROSCOPIC CHOLECYSTECTOMY with IOC;  Surgeon: Gayland Curry, MD;  Location: [place];  Service: General;  Laterality: N/A;  . CORONARY ARTERY BYPASS GRAFT  1995  . ESOPHAGOGASTRODUODENOSCOPY N/A 03/02/2014   Procedure: ESOPHAGOGASTRODUODENOSCOPY (EGD);  Surgeon: Ladene Artist, MD;  Location: [facility] ENDOSCOPY;  Service: Endoscopy;  Laterality: N/A;  sarah/leone    Current Outpatient Medications  Medication Sig Dispense Refill  . acetaminophen (TYLENOL) 325 MG tablet Take 2 tablets (650 mg total) by mouth every 6 (six) hours as needed for mild pain, moderate pain, fever or headache.    Marland Kitchen amLODipine (NORVASC) 10 MG tablet Take 1 tablet (10 mg total) by mouth daily. 90 tablet 1  . aspirin 81 MG tablet Take 81 mg by mouth daily.    Marland Kitchen atenolol (TENORMIN) 50 MG tablet Take 1.5 tablets (75 mg total) by mouth daily. 135 tablet 2  . atorvastatin (LIPITOR) 20 MG tablet Take 1 tablet (20 mg total) by mouth daily. 90 tablet 2  . benazepril (LOTENSIN) 20 MG tablet Take 1 tablet (  20 mg total) by mouth 2 (two) times daily. 180 tablet 2  . diazepam (VALIUM) 10 MG tablet Take 1 tablet (10 mg total) by mouth every 12 (twelve) hours as needed for anxiety. 30 tablet 1  . docusate sodium (COLACE) 100 MG capsule Take 100 mg by mouth daily as needed (constipation).    Marland Kitchen esomeprazole (NEXIUM) 40 MG capsule Take 40 mg by mouth daily as needed (acid reflux or indigestion). Reported on 08/24/2015    . fexofenadine (ALLEGRA) 180 MG tablet Take 180 mg by mouth daily.      . niacin (NIASPAN) 500 MG CR tablet Take 500 mg by mouth 2 (two) times daily. AT BEDTIME     No current facility-administered medications for this visit.     Allergies:    Hydrocodone-acetaminophen and Tramadol hcl   Social History:  The patient  reports that he quit smoking about 41 years ago. he has never used smokeless tobacco. He reports that he does not drink alcohol or use drugs.   Family History:  The patient's family history includes Heart attack in his brother; Hypertension in his father.   ROS:  Please see the history of present illness.  Otherwise, review of systems is positive for constipation.  All other systems are reviewed and negative.   PHYSICAL EXAM: VS:  BP 138/70   Pulse (!) 53   Ht 5\' 10"  (1.778 m)   Wt 187 lb 12.8 oz (85.2 kg)   BMI 26.95 kg/m  , BMI Body mass index is 26.95 kg/m. GEN: Well nourished, well developed, in no acute distress  HEENT: normal  Neck: no JVD, no masses. No carotid bruits Cardiac: RRR without murmur or gallop     Respiratory:  clear to auscultation bilaterally, normal work of breathing GI: soft, nontender, nondistended, + BS MS: no deformity or atrophy  Ext: no pretibial edema Skin: warm and dry, no rash Neuro:  Strength and sensation are intact Psych: euthymic mood, full affect  EKG:  EKG is ordered today. The ekg ordered today shows sinus bradycardia 53 bpm, first-degree AV block, otherwise within normal limits.  Recent Labs: 02/27/2017: ALT 18; BUN 24; Creatinine, Ser 1.22; Potassium 4.4; Sodium 140   Lipid Panel     Component Value Date/Time   CHOL 110 08/27/2016 0954   TRIG 142.0 08/27/2016 0954   TRIG 92 06/26/2006 1042   HDL 34.20 (L) 08/27/2016 0954   CHOLHDL 3 08/27/2016 0954   VLDL 28.4 08/27/2016 0954   LDLCALC 47 08/27/2016 0954      Wt Readings from Last 3 Encounters:  08/29/17 187 lb 12.8 oz (85.2 kg)  02/27/17 188 lb 4 oz (85.4 kg)  08/27/16 202 lb 6 oz (91.8 kg)     Cardiac Studies Reviewed: Renal arterial Doppler reviewed: There is severe bilateral renal artery stenosis stable from previous studies with normal kidney size bilaterally.  ASSESSMENT AND PLAN: 1.   Coronary artery disease status post remote CABG: No symptoms of angina. The patient is doing extremely well.  I applauded his efforts at exercise and weight loss.  No changes are made in his medical regimen aspirin for antiplatelet therapy, beta-blocker, and a high intensity statin drug.  2.  Bilateral renal artery stenosis: Most recent labs are reviewed with a creatinine of 1.2 mg/dL which is stable from past readings.  His blood pressure is well controlled and in fact is oftentimes on the low side after exercise.  He feels well and no medicine changes are  3.  Hypertension: As above, continue atenolol, amlodipine, and benazepril.  4.  Hyperlipidemia: Treated with atorvastatin 20 mg daily.  Lipids have been excellent on review of past labs with an LDL cholesterol well below 70 mg/dL.  5.  Peripheral arterial disease with intermittent claudication: Symptoms have been completely resolved with his walking/exercise program.  Current medicines are reviewed with the patient today.  The patient does not have concerns regarding medicines.  Labs/ tests ordered today include:   Orders Placed This Encounter  Procedures  . EKG 12-Lead    Disposition:   FU one year  Signed, Sherren Mocha, MD  08/29/2017 1:24 PM    [facility] [address], [place], [place]  [address] Phone: [phone]; Fax: [phone]

## 2017-09-03 ENCOUNTER — Ambulatory Visit (INDEPENDENT_AMBULATORY_CARE_PROVIDER_SITE_OTHER): Payer: Medicare Other | Admitting: Internal Medicine

## 2017-09-03 ENCOUNTER — Encounter: Payer: Self-pay | Admitting: Internal Medicine

## 2017-09-03 ENCOUNTER — Other Ambulatory Visit: Payer: Self-pay | Admitting: Internal Medicine

## 2017-09-03 VITALS — BP 132/68 | HR 59 | Temp 97.7°F | Resp 14 | Ht 70.0 in | Wt 182.0 lb

## 2017-09-03 DIAGNOSIS — I10 Essential (primary) hypertension: Secondary | ICD-10-CM | POA: Diagnosis not present

## 2017-09-03 DIAGNOSIS — Z0000 Encounter for general adult medical examination without abnormal findings: Secondary | ICD-10-CM

## 2017-09-03 DIAGNOSIS — G4700 Insomnia, unspecified: Secondary | ICD-10-CM

## 2017-09-03 DIAGNOSIS — Z79899 Other long term (current) drug therapy: Secondary | ICD-10-CM

## 2017-09-03 DIAGNOSIS — I2510 Atherosclerotic heart disease of native coronary artery without angina pectoris: Secondary | ICD-10-CM

## 2017-09-03 LAB — CBC WITH DIFFERENTIAL/PLATELET
BASOS PCT: 0.6 % (ref 0.0–3.0)
Basophils Absolute: 0.1 10*3/uL (ref 0.0–0.1)
EOS PCT: 3 % (ref 0.0–5.0)
Eosinophils Absolute: 0.3 10*3/uL (ref 0.0–0.7)
HEMATOCRIT: 47.3 % (ref 39.0–52.0)
HEMOGLOBIN: 16.1 g/dL (ref 13.0–17.0)
Lymphocytes Relative: 25.1 % (ref 12.0–46.0)
Lymphs Abs: 2.6 10*3/uL (ref 0.7–4.0)
MCHC: 34.1 g/dL (ref 30.0–36.0)
MCV: 87.4 fl (ref 78.0–100.0)
MONO ABS: 0.9 10*3/uL (ref 0.1–1.0)
Monocytes Relative: 8.5 % (ref 3.0–12.0)
Neutro Abs: 6.4 10*3/uL (ref 1.4–7.7)
Neutrophils Relative %: 62.8 % (ref 43.0–77.0)
Platelets: 161 10*3/uL (ref 150.0–400.0)
RBC: 5.4 Mil/uL (ref 4.22–5.81)
RDW: 13.9 % (ref 11.5–15.5)
WBC: 10.2 10*3/uL (ref 4.0–10.5)

## 2017-09-03 LAB — BASIC METABOLIC PANEL
BUN: 37 mg/dL — AB (ref 6–23)
CALCIUM: 10.2 mg/dL (ref 8.4–10.5)
CO2: 28 mEq/L (ref 19–32)
Chloride: 104 mEq/L (ref 96–112)
Creatinine, Ser: 1.64 mg/dL — ABNORMAL HIGH (ref 0.40–1.50)
GFR: 43.53 mL/min — AB (ref >60.00)
GLUCOSE: 99 mg/dL (ref 70–99)
POTASSIUM: 4 meq/L (ref 3.5–5.1)
SODIUM: 140 meq/L (ref 135–145)

## 2017-09-03 LAB — LIPID PANEL
CHOLESTEROL: 117 mg/dL (ref 0–200)
HDL: 34.2 mg/dL — ABNORMAL LOW (ref >39.00)
LDL Cholesterol: 55 mg/dL (ref 0–99)
NONHDL: 83.13
Total CHOL/HDL Ratio: 3
Triglycerides: 142 mg/dL (ref 0.0–149.0)
VLDL: 28.4 mg/dL (ref 0.0–40.0)

## 2017-09-03 LAB — TSH: TSH: 2.35 u[IU]/mL (ref 0.35–4.50)

## 2017-09-03 NOTE — Progress Notes (Signed)
Pre visit review using our clinic review tool, if applicable. No additional management support is needed unless otherwise documented below in the visit note. 

## 2017-09-03 NOTE — Patient Instructions (Signed)
GO TO THE LAB : Get the blood work     GO TO THE FRONT DESK Schedule your next appointment for a checkup in 6-7 months  Schedule a Medicare wellness exam with one of our nurses if you desire

## 2017-09-03 NOTE — Progress Notes (Signed)
Subjective:    Patient ID: Dustin Ashley, male    DOB: 19-May-1941, 77 y.o.   MRN: [id number]  DOS:  09/03/2017 Type of visit - description :  cpx Interval history: No major concerns. Saw cardiology, note reviewed. Has lost weight, exercising regularly, controlling his portions better.  Wt Readings from Last 3 Encounters:  09/03/17 182 lb (82.6 kg)  08/29/17 187 lb 12.8 oz (85.2 kg)  02/27/17 188 lb 4 oz (85.4 kg)     Review of Systems  A 14 point review of systems is negative    Past Medical History:  Diagnosis Date  . Allergic rhinitis   . Allergy    seasonal  . 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 CAD (coronary artery disease)   . Chronic renal insufficiency, stage II (mild)    Cr ~ 1.4, u/s 4-12 normal kidneys  . Gallstone pancreatitis 2015  . GERD (gastroesophageal reflux disease)   . HOH (hard of hearing)    has a hearing aid L, sees audiology rountinely  . Hyperglycemia    A1C 5.8 04-2010  . Hyperlipemia   . Hypertension   . Pain, joint, multiple sites    uses valium, occ uses for insomnia. uses for shoulder  pain  . Paroxysmal atrial fibrillation (HCC)   . Personal history of colonic polyps   . Pulmonary emboli ([place])    02-2014  . RAS (renal artery stenosis) ([place]) 05-2012   . Renal insufficiency     Past Surgical History:  Procedure Laterality Date  . CARDIAC CATHETERIZATION    . CHOLECYSTECTOMY N/A 02/15/2014   Procedure: LAPAROSCOPIC CHOLECYSTECTOMY with IOC;  Surgeon: Gayland Curry, MD;  Location: [place];  Service: General;  Laterality: N/A;  . CORONARY ARTERY BYPASS GRAFT  1995  . ESOPHAGOGASTRODUODENOSCOPY N/A 03/02/2014   Procedure: ESOPHAGOGASTRODUODENOSCOPY (EGD);  Surgeon: Ladene Artist, MD;  Location: [facility] ENDOSCOPY;  Service: Endoscopy;  Laterality: N/A;  sarah/leone    Social History   Socioeconomic History  . Marital status: Married    Spouse name: Not on file  . Number of children: 1  . Years of education: Not on file  . Highest education level: Not on  file  Social Needs  . Financial resource strain: Not on file  . Food insecurity - worry: Not on file  . Food insecurity - inability: Not on file  . Transportation needs - medical: Not on file  . Transportation needs - non-medical: Not on file  Occupational History  . Occupation: retired, Artist  Tobacco Use  . Smoking status: Former Smoker    Last attempt to quit: 03/13/1976    Years since quitting: 41.5  . Smokeless tobacco: Never Used  Substance and Sexual Activity  . Alcohol use: No    Alcohol/week: 0.0 oz  . Drug use: No  . Sexual activity: Not on file  Other Topics Concern  . Not on file  Social History Narrative   Born in [place], has a large extended family, oldest of several brothers-sister    Has one son    Family History  Problem Relation Age of Onset  . Hypertension Father   . Heart attack Brother        2 brothers, CABG  . Diabetes Neg Hx   . Colon cancer Neg Hx   . Prostate cancer Neg Hx        colon, prostate      Allergies as of 09/03/2017      Reactions  Hydrocodone-acetaminophen Other (See Comments)   REACTION: bladder obstruction   Tramadol Hcl Other (See Comments)   REACTION: bladder obstruction      Medication List        Accurate as of 09/03/17  5:50 PM. Always use your most recent med list.          acetaminophen 325 MG tablet Commonly known as:  TYLENOL Take 2 tablets (650 mg total) by mouth every 6 (six) hours as needed for mild pain, moderate pain, fever or headache.   amLODipine 10 MG tablet Commonly known as:  NORVASC Take 1 tablet (10 mg total) by mouth daily.   aspirin 81 MG tablet Take 81 mg by mouth daily.   atenolol 50 MG tablet Commonly known as:  TENORMIN Take 1.5 tablets (75 mg total) by mouth daily.   atorvastatin 20 MG tablet Commonly known as:  LIPITOR Take 1 tablet (20 mg total) by mouth daily.   benazepril 20 MG tablet Commonly known as:  LOTENSIN Take 1 tablet (20 mg total) by mouth 2  (two) times daily.   diazepam 10 MG tablet Commonly known as:  VALIUM Take 1 tablet (10 mg total) by mouth every 12 (twelve) hours as needed for anxiety.   docusate sodium 100 MG capsule Commonly known as:  COLACE Take 100 mg by mouth daily as needed (constipation).   esomeprazole 40 MG capsule Commonly known as:  NEXIUM Take 40 mg by mouth daily as needed (acid reflux or indigestion). Reported on 08/24/2015   fexofenadine 180 MG tablet Commonly known as:  ALLEGRA Take 180 mg by mouth daily.   niacin 500 MG CR tablet Commonly known as:  NIASPAN Take 500 mg by mouth 2 (two) times daily. AT BEDTIME          Objective:   Physical Exam BP 132/68 (BP Location: Left Arm, Patient Position: Sitting, Cuff Size: Small)   Pulse (!) 59   Temp 97.7 F (36.5 C) (Oral)   Resp 14   Ht 5\' 10"  (1.778 m)   Wt 182 lb (82.6 kg)   SpO2 95%   BMI 26.11 kg/m  General:   Well developed, well nourished . NAD.  HEENT:  Normocephalic . Face symmetric, atraumatic Lungs:  CTA B Normal respiratory effort, no intercostal retractions, no accessory muscle use. Heart: RRR,  no murmur.  No pretibial edema bilaterally  Abdomen:  Not distended, soft, non-tender. No rebound or rigidity.   Skin: Exposed areas without rash. Not pale. Not jaundice Neurologic:  alert & oriented X3.  Speech normal, gait appropriate for age and unassisted Strength symmetric and appropriate for age.  Psych: Cognition and judgment appear intact.  Cooperative with normal attention span and concentration.  Behavior appropriate. No anxious or depressed appearing.     Assessment & Plan:   Assessment Prediabetes HTN Hyperlipidemia GERD, h/o Candida esophagitis 02-2014, nexium prn Insomnia, on diazepam MSK --  occ. neck pain, back pain, diazepam prn CKD  Renal US wnl 2012 CV: --CAD, CABG 1995 --Paroxysmal atrial fibrillation; (-) 3 week monitor ~07-2014  , not anticoag as off 02-2017  --Pulmonary emboli , DVT,  provoked 02-2014 --RAS dx 2013 Low testosterone 11-2014 ED  HOH-- has aids H/o Bell palsy  H/ o gallbladder pancreatitis Sees dermatology  PLAN: Prediabetes: All previous A1Cs very good, recheck in few months. HTN: Continue amlodipine, Tenormin, Lotensin, BP sometimes in the low side particularly after exercise (91/51) but with no sxs.  Shortly after exercise BPs in the 120s, no  change. Hyperlipidemia: On Lipitor, Niaspan, checking labs CAD, RAS: Recently seen by cardiology, felt to be stable. Neck , back pain: On Tylenol and diazepam, contract and UDS today RTC 6 months

## 2017-09-03 NOTE — Telephone Encounter (Signed)
Awaiting lab results from today.

## 2017-09-03 NOTE — Assessment & Plan Note (Signed)
-  Td 2015; PNM shot 2008; prevnar 2015; had a flu shot  shingrex not available -CCS: cscope 02-2015, next 3 years  Prostate cancer screening: Pros and cons discussed, elected not to screen. -Labs: BMP, FLP, CBC, TSH -Doing great with physical activity, exercising at the [facility] 3 times a week, eating slightly smaller portions.  Has lost weight.

## 2017-09-03 NOTE — Assessment & Plan Note (Signed)
Prediabetes: All previous A1Cs very good, recheck in few months. HTN: Continue amlodipine, Tenormin, Lotensin, BP sometimes in the low side particularly after exercise (91/51) but with no sxs.  Shortly after exercise BPs in the 120s, no change. Hyperlipidemia: On Lipitor, Niaspan, checking labs CAD, RAS: Recently seen by cardiology, felt to be stable. Neck , back pain: On Tylenol and diazepam, contract and UDS today RTC 6 months

## 2017-09-04 NOTE — Addendum Note (Signed)
Addended byDamita Dunnings D on: 09/04/2017 01:10 PM   Modules accepted: Orders

## 2017-09-04 NOTE — Addendum Note (Signed)
Addended byDamita Dunnings D on: 09/04/2017 01:11 PM   Modules accepted: Orders

## 2017-09-07 LAB — PAIN MGMT, PROFILE 8 W/CONF, U
6 ACETYLMORPHINE: NEGATIVE ng/mL (ref <10)
ALPHAHYDROXYALPRAZOLAM: NEGATIVE ng/mL (ref <25)
ALPHAHYDROXYMIDAZOLAM: NEGATIVE ng/mL (ref <50)
ALPHAHYDROXYTRIAZOLAM: NEGATIVE ng/mL (ref <50)
AMINOCLONAZEPAM: NEGATIVE ng/mL (ref <25)
AMPHETAMINES: NEGATIVE ng/mL (ref <500)
Alcohol Metabolites: NEGATIVE ng/mL (ref <500)
BENZODIAZEPINES: POSITIVE ng/mL — AB (ref <100)
BUPRENORPHINE, URINE: NEGATIVE ng/mL (ref <5)
Cocaine Metabolite: NEGATIVE ng/mL (ref <150)
Creatinine: 62.6 mg/dL
HYDROXYETHYLFLURAZEPAM: NEGATIVE ng/mL (ref <50)
Lorazepam: NEGATIVE ng/mL (ref <50)
MARIJUANA METABOLITE: NEGATIVE ng/mL (ref <20)
MDMA: NEGATIVE ng/mL (ref <500)
Nordiazepam: 247 ng/mL — ABNORMAL HIGH (ref <50)
OXAZEPAM: 490 ng/mL — AB (ref <50)
OXYCODONE: NEGATIVE ng/mL (ref <100)
Opiates: NEGATIVE ng/mL (ref <100)
Oxidant: NEGATIVE ug/mL (ref <200)
TEMAZEPAM: 312 ng/mL — AB (ref <50)
pH: 5.89 (ref 4.5–9.0)

## 2017-09-25 ENCOUNTER — Other Ambulatory Visit (INDEPENDENT_AMBULATORY_CARE_PROVIDER_SITE_OTHER): Payer: Medicare Other

## 2017-09-25 DIAGNOSIS — I10 Essential (primary) hypertension: Secondary | ICD-10-CM

## 2017-09-25 LAB — BASIC METABOLIC PANEL
BUN: 23 mg/dL (ref 6–23)
CO2: 26 mEq/L (ref 19–32)
Calcium: 9.4 mg/dL (ref 8.4–10.5)
Chloride: 107 mEq/L (ref 96–112)
Creatinine, Ser: 1.42 mg/dL (ref 0.40–1.50)
GFR: 51.4 mL/min — ABNORMAL LOW (ref >60.00)
Glucose, Bld: 98 mg/dL (ref 70–99)
Potassium: 3.8 mEq/L (ref 3.5–5.1)
Sodium: 140 mEq/L (ref 135–145)

## 2017-11-11 ENCOUNTER — Telehealth: Payer: Self-pay | Admitting: Internal Medicine

## 2017-11-11 NOTE — Telephone Encounter (Signed)
Pt is requesting refill on diazepam.   Last OV: 09/03/2017 Last Fill: 06/09/2017 #30 and 1RF UDS: 09/03/2017 Low risk  NCCR in media-09/02/2017- no discrepancies noted  Please advise.

## 2017-11-11 NOTE — Telephone Encounter (Signed)
Sent!

## 2017-11-21 DIAGNOSIS — M542 Cervicalgia: Secondary | ICD-10-CM | POA: Diagnosis not present

## 2017-11-21 DIAGNOSIS — M9901 Segmental and somatic dysfunction of cervical region: Secondary | ICD-10-CM | POA: Diagnosis not present

## 2017-11-24 DIAGNOSIS — M9901 Segmental and somatic dysfunction of cervical region: Secondary | ICD-10-CM | POA: Diagnosis not present

## 2017-11-24 DIAGNOSIS — M542 Cervicalgia: Secondary | ICD-10-CM | POA: Diagnosis not present

## 2017-11-28 DIAGNOSIS — M542 Cervicalgia: Secondary | ICD-10-CM | POA: Diagnosis not present

## 2017-11-28 DIAGNOSIS — M9901 Segmental and somatic dysfunction of cervical region: Secondary | ICD-10-CM | POA: Diagnosis not present

## 2017-12-01 DIAGNOSIS — M9901 Segmental and somatic dysfunction of cervical region: Secondary | ICD-10-CM | POA: Diagnosis not present

## 2017-12-01 DIAGNOSIS — M542 Cervicalgia: Secondary | ICD-10-CM | POA: Diagnosis not present

## 2017-12-03 DIAGNOSIS — M542 Cervicalgia: Secondary | ICD-10-CM | POA: Diagnosis not present

## 2017-12-03 DIAGNOSIS — M9901 Segmental and somatic dysfunction of cervical region: Secondary | ICD-10-CM | POA: Diagnosis not present

## 2017-12-05 DIAGNOSIS — M542 Cervicalgia: Secondary | ICD-10-CM | POA: Diagnosis not present

## 2017-12-05 DIAGNOSIS — M9901 Segmental and somatic dysfunction of cervical region: Secondary | ICD-10-CM | POA: Diagnosis not present

## 2017-12-10 DIAGNOSIS — M542 Cervicalgia: Secondary | ICD-10-CM | POA: Diagnosis not present

## 2017-12-10 DIAGNOSIS — M9901 Segmental and somatic dysfunction of cervical region: Secondary | ICD-10-CM | POA: Diagnosis not present

## 2017-12-16 ENCOUNTER — Other Ambulatory Visit: Payer: Self-pay | Admitting: Internal Medicine

## 2018-03-06 DIAGNOSIS — M542 Cervicalgia: Secondary | ICD-10-CM | POA: Diagnosis not present

## 2018-03-06 DIAGNOSIS — M9901 Segmental and somatic dysfunction of cervical region: Secondary | ICD-10-CM | POA: Diagnosis not present

## 2018-03-12 ENCOUNTER — Ambulatory Visit: Payer: Medicare Other | Admitting: Internal Medicine

## 2018-03-12 ENCOUNTER — Telehealth: Payer: Self-pay

## 2018-03-12 DIAGNOSIS — Z0289 Encounter for other administrative examinations: Secondary | ICD-10-CM

## 2018-03-12 NOTE — Telephone Encounter (Signed)
Ok , please no charge for reschedule

## 2018-03-12 NOTE — Telephone Encounter (Signed)
Copied from [name] [phone]. Topic: Quick Communication - Appointment Cancellation >> Mar 12, 2018 10:16 AM Dustin Ashley wrote: Patient called to cancel appointment scheduled for 03/12/18. Patient has rescheduled their appointment.  Route to department's PEC pool.

## 2018-03-13 ENCOUNTER — Encounter: Payer: Self-pay | Admitting: Internal Medicine

## 2018-03-13 ENCOUNTER — Ambulatory Visit (INDEPENDENT_AMBULATORY_CARE_PROVIDER_SITE_OTHER): Payer: Medicare Other | Admitting: Internal Medicine

## 2018-03-13 VITALS — BP 110/70 | HR 55 | Temp 98.4°F | Resp 16 | Ht 70.0 in | Wt 185.0 lb

## 2018-03-13 DIAGNOSIS — I10 Essential (primary) hypertension: Secondary | ICD-10-CM

## 2018-03-13 DIAGNOSIS — F4321 Adjustment disorder with depressed mood: Secondary | ICD-10-CM

## 2018-03-13 LAB — COMPREHENSIVE METABOLIC PANEL
ALK PHOS: 79 U/L (ref 39–117)
ALT: 16 U/L (ref 0–53)
AST: 18 U/L (ref 0–37)
Albumin: 4.2 g/dL (ref 3.5–5.2)
BILIRUBIN TOTAL: 0.9 mg/dL (ref 0.2–1.2)
BUN: 31 mg/dL — AB (ref 6–23)
CO2: 27 mEq/L (ref 19–32)
Calcium: 9.3 mg/dL (ref 8.4–10.5)
Chloride: 106 mEq/L (ref 96–112)
Creatinine, Ser: 1.44 mg/dL (ref 0.40–1.50)
GFR: 50.51 mL/min — AB (ref >60.00)
Glucose, Bld: 96 mg/dL (ref 70–99)
Potassium: 4.2 mEq/L (ref 3.5–5.1)
SODIUM: 140 meq/L (ref 135–145)
TOTAL PROTEIN: 6.7 g/dL (ref 6.0–8.3)

## 2018-03-13 NOTE — Patient Instructions (Signed)
GO TO THE LAB : Get the blood work     GO TO THE FRONT DESK Schedule your next appointment for a  Physical exam by 08/2018  Check the  blood pressure 2 or 3 times a month  Be sure your blood pressure is between 110/65 and  135/85. If it is consistently higher or lower, let me know  Okay to stop the medication called Niaspan.

## 2018-03-13 NOTE — Progress Notes (Signed)
Subjective:    Patient ID: Dustin Ashley, male    DOB: Jan 14, 1941, 77 y.o.   MRN: [id number]  DOS:  03/13/2018 Type of visit - description : rov Interval history: Few days ago, the patient lost his wife unexpectedly.  He is doing "okay" for the circumstances, he has excellent family support, he is trying to remain active, busy. HTN: Good med compliance, ambulatory BPs range from 107-130.  Mostly in the 120s.   Review of Systems Denies feeling dizzy, weak or orthostatic.  Past Medical History:  Diagnosis Date  . Allergic rhinitis   . Allergy    seasonal  . 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 CAD (coronary artery disease)   . Chronic renal insufficiency, stage II (mild)    Cr ~ 1.4, u/s 4-12 normal kidneys  . Gallstone pancreatitis 2015  . GERD (gastroesophageal reflux disease)   . HOH (hard of hearing)    has a hearing aid L, sees audiology rountinely  . Hyperglycemia    A1C 5.8 04-2010  . Hyperlipemia   . Hypertension   . Pain, joint, multiple sites    uses valium, occ uses for insomnia. uses for shoulder  pain  . Paroxysmal atrial fibrillation (HCC)   . Personal history of colonic polyps   . Pulmonary emboli ([place])    02-2014  . RAS (renal artery stenosis) ([place]) 05-2012   . Renal insufficiency     Past Surgical History:  Procedure Laterality Date  . CARDIAC CATHETERIZATION    . CHOLECYSTECTOMY N/A 02/15/2014   Procedure: LAPAROSCOPIC CHOLECYSTECTOMY with IOC;  Surgeon: Gayland Curry, MD;  Location: [facility];  Service: General;  Laterality: N/A;  . CORONARY ARTERY BYPASS GRAFT  1995  . ESOPHAGOGASTRODUODENOSCOPY N/A 03/02/2014   Procedure: ESOPHAGOGASTRODUODENOSCOPY (EGD);  Surgeon: Ladene Artist, MD;  Location: [facility] ENDOSCOPY;  Service: Endoscopy;  Laterality: N/A;  sarah/leone    Social History   Socioeconomic History  . Marital status: Married    Spouse name: Not on file  . Number of children: 1  . Years of education: Not on file  . Highest education level: Not on file    Occupational History  . Occupation: retired, Artist  Social Needs  . Financial resource strain: Not on file  . Food insecurity:    Worry: Not on file    Inability: Not on file  . Transportation needs:    Medical: Not on file    Non-medical: Not on file  Tobacco Use  . Smoking status: Former Smoker    Last attempt to quit: 03/13/1976    Years since quitting: 42.0  . Smokeless tobacco: Never Used  Substance and Sexual Activity  . Alcohol use: No    Alcohol/week: 0.0 standard drinks  . Drug use: No  . Sexual activity: Not on file  Lifestyle  . Physical activity:    Days per week: Not on file    Minutes per session: Not on file  . Stress: Not on file  Relationships  . Social connections:    Talks on phone: Not on file    Gets together: Not on file    Attends religious service: Not on file    Active member of club or organization: Not on file    Attends meetings of clubs or organizations: Not on file    Relationship status: Not on file  . Intimate partner violence:    Fear of current or ex partner: Not on file    Emotionally abused:  Not on file    Physically abused: Not on file    Forced sexual activity: Not on file  Other Topics Concern  . Not on file  Social History Narrative   Born in [place], has a large extended family, oldest of several brothers-sister    Has one son       Allergies as of 03/13/2018      Reactions   Hydrocodone-acetaminophen Other (See Comments)   REACTION: bladder obstruction   Tramadol Hcl Other (See Comments)   REACTION: bladder obstruction      Medication List        Accurate as of 03/13/18 11:59 PM. Always use your most recent med list.          acetaminophen 325 MG tablet Commonly known as:  TYLENOL Take 2 tablets (650 mg total) by mouth every 6 (six) hours as needed for mild pain, moderate pain, fever or headache.   amLODipine 10 MG tablet Commonly known as:  NORVASC Take 1 tablet (10 mg total) by mouth  daily.   aspirin 81 MG tablet Take 81 mg by mouth daily.   atenolol 50 MG tablet Commonly known as:  TENORMIN Take 1.5 tablets (75 mg total) by mouth daily.   atorvastatin 20 MG tablet Commonly known as:  LIPITOR Take 1 tablet (20 mg total) by mouth daily.   benazepril 20 MG tablet Commonly known as:  LOTENSIN Take 1 tablet (20 mg total) by mouth daily.   diazepam 10 MG tablet Commonly known as:  VALIUM TAKE ONE TABLET BY MOUTH EVERY 12 HOURS AS NEEDED FOR ANXIETY   docusate sodium 100 MG capsule Commonly known as:  COLACE Take 100 mg by mouth daily as needed (constipation).   fexofenadine 180 MG tablet Commonly known as:  ALLEGRA Take 180 mg by mouth daily.   niacin 500 MG CR tablet Commonly known as:  NIASPAN Take 500 mg by mouth 2 (two) times daily. AT BEDTIME          Objective:   Physical Exam BP 110/70 (BP Location: Left Arm, Patient Position: Sitting, Cuff Size: Small)   Pulse (!) 55   Temp 98.4 F (36.9 C) (Oral)   Resp 16   Ht 5\' 10"  (1.778 m)   Wt 185 lb (83.9 kg)   SpO2 97%   BMI 26.54 kg/m  General:   Well developed, NAD, see BMI.  HEENT:  Normocephalic . Face symmetric, atraumatic Lungs:  CTA B Normal respiratory effort, no intercostal retractions, no accessory muscle use. Heart: RRR,  no murmur.  No pretibial edema bilaterally  Skin: Not pale. Not jaundice Neurologic:  alert & oriented X3.  Speech normal, gait appropriate for age and unassisted Psych--  Cognition and judgment appear intact.  Cooperative with normal attention span and concentration.  Behavior appropriate. Emotional     Assessment & Plan:   Assessment Prediabetes HTN Hyperlipidemia GERD, h/o Candida esophagitis 02-2014, nexium prn Insomnia, on diazepam MSK --  occ. 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  BPs are well controlled although they are occasionally in the low  side.  He has no symptoms. Plan: Check a CMP, continue amlodipine 10 mg, Tenormin 75 mg, Lotensin 20 mg.  Consider adjust medications depending on blood work and ambulatory BPs. High cholesterol: Continue Lipitor, okay to stop niacin based on current literature. Lost his wife, seems to be grieving appropriately, has very good family support.  Condolences provided. RTC 08-2018 CPX

## 2018-03-15 NOTE — Assessment & Plan Note (Signed)
HTN: Creatinine was 1.62 , see last office visit, Lotensin dose decreased, follow-up creatinine was 1.42.  BPs are well controlled although they are occasionally in the low side.  He has no symptoms. Plan: Check a CMP, continue amlodipine 10 mg, Tenormin 75 mg, Lotensin 20 mg.  Consider adjust medications depending on blood work and ambulatory BPs. High cholesterol: Continue Lipitor, okay to stop niacin based on current literature. Lost his wife, seems to be grieving appropriately, has very good family support.  Condolences provided. RTC 08-2018 CPX

## 2018-04-06 ENCOUNTER — Telehealth: Payer: Self-pay | Admitting: Internal Medicine

## 2018-04-06 NOTE — Telephone Encounter (Signed)
Pt is requesting refill on diazepam.   Last OV: 03/13/2018 Last Fill: 11/11/2017 #30 and 1RF UDS: 09/03/2017 Low risk  NCCR printed- no discrepancies noted- sent for scanning

## 2018-04-06 NOTE — Telephone Encounter (Signed)
Sent!

## 2018-05-05 DIAGNOSIS — D225 Melanocytic nevi of trunk: Secondary | ICD-10-CM | POA: Diagnosis not present

## 2018-05-05 DIAGNOSIS — L570 Actinic keratosis: Secondary | ICD-10-CM | POA: Diagnosis not present

## 2018-05-05 DIAGNOSIS — D1801 Hemangioma of skin and subcutaneous tissue: Secondary | ICD-10-CM | POA: Diagnosis not present

## 2018-05-05 DIAGNOSIS — L814 Other melanin hyperpigmentation: Secondary | ICD-10-CM | POA: Diagnosis not present

## 2018-05-05 DIAGNOSIS — L821 Other seborrheic keratosis: Secondary | ICD-10-CM | POA: Diagnosis not present

## 2018-05-14 ENCOUNTER — Other Ambulatory Visit: Payer: Self-pay | Admitting: Internal Medicine

## 2018-05-15 ENCOUNTER — Ambulatory Visit (INDEPENDENT_AMBULATORY_CARE_PROVIDER_SITE_OTHER): Payer: Medicare Other

## 2018-05-15 DIAGNOSIS — Z23 Encounter for immunization: Secondary | ICD-10-CM

## 2018-06-03 ENCOUNTER — Other Ambulatory Visit: Payer: Self-pay

## 2018-06-03 NOTE — Patient Outreach (Signed)
[facility]) Care Management  06/03/2018  Dustin Ashley 1940-07-28 [id number]   Medication Adherence call to Mr. Dustin Ashley spoke with patient Dustin Ashley is due on Benazepril 20 mg,he explain he is only taking 1 tablet daily instead of 2 tablets daily and has medication at this time. Dustin Ashley is showing past due under [place].    [facility] Management Direct Dial [phone]  Fax [phone] [email or web address]

## 2018-06-08 ENCOUNTER — Other Ambulatory Visit: Payer: Self-pay | Admitting: Internal Medicine

## 2018-06-18 ENCOUNTER — Encounter: Payer: Self-pay | Admitting: Internal Medicine

## 2018-07-16 ENCOUNTER — Encounter: Payer: Self-pay | Admitting: Internal Medicine

## 2018-07-29 ENCOUNTER — Telehealth: Payer: Self-pay | Admitting: Internal Medicine

## 2018-07-29 NOTE — Telephone Encounter (Signed)
Pt is requesting refill on diazepam.   Last OV: 03/13/2018 Last Fill: 04/06/2018 #30 and 1RF UDS: 09/03/2017 Low risk  NCCR printed- no discrepancies noted- sent for scanning.

## 2018-07-29 NOTE — Telephone Encounter (Signed)
Sent!

## 2018-08-11 ENCOUNTER — Other Ambulatory Visit: Payer: Self-pay | Admitting: Internal Medicine

## 2018-08-24 ENCOUNTER — Ambulatory Visit (AMBULATORY_SURGERY_CENTER): Payer: Self-pay

## 2018-08-24 ENCOUNTER — Encounter: Payer: Self-pay | Admitting: Internal Medicine

## 2018-08-24 VITALS — Ht 70.0 in | Wt 181.6 lb

## 2018-08-24 DIAGNOSIS — Z86010 Personal history of colonic polyps: Secondary | ICD-10-CM

## 2018-08-24 MED ORDER — NA SULFATE-K SULFATE-MG SULF 17.5-3.13-1.6 GM/177ML PO SOLN: 1.0000 | Freq: Once | ORAL | 0 refills | Status: AC

## 2018-08-24 NOTE — Progress Notes (Signed)
Denies allergies to eggs or soy products. Denies complication of anesthesia or sedation. Denies use of weight loss medication. Denies use of O2.   Emmi instructions declined. Patient has had previous colonoscopies.   Patient states that he can not drink Gatorade without vomiting. Patient states he would rather try Suprep because it was less volume. A sample of Suprep was given to the patient.

## 2018-08-31 DIAGNOSIS — M9901 Segmental and somatic dysfunction of cervical region: Secondary | ICD-10-CM | POA: Diagnosis not present

## 2018-08-31 DIAGNOSIS — M542 Cervicalgia: Secondary | ICD-10-CM | POA: Diagnosis not present

## 2018-09-04 DIAGNOSIS — M542 Cervicalgia: Secondary | ICD-10-CM | POA: Diagnosis not present

## 2018-09-04 DIAGNOSIS — M9901 Segmental and somatic dysfunction of cervical region: Secondary | ICD-10-CM | POA: Diagnosis not present

## 2018-09-07 ENCOUNTER — Emergency Department (HOSPITAL_COMMUNITY): Payer: Medicare Other

## 2018-09-07 ENCOUNTER — Telehealth: Payer: Self-pay | Admitting: Internal Medicine

## 2018-09-07 ENCOUNTER — Encounter (HOSPITAL_COMMUNITY): Payer: Self-pay

## 2018-09-07 ENCOUNTER — Other Ambulatory Visit: Payer: Self-pay

## 2018-09-07 ENCOUNTER — Encounter: Payer: Medicare Other | Admitting: Internal Medicine

## 2018-09-07 ENCOUNTER — Emergency Department (HOSPITAL_COMMUNITY)
Admission: EM | Admit: 2018-09-07 | Discharge: 2018-09-07 | Disposition: A | Discharge status: Home / Self Care | Payer: Medicare Other | Attending: Emergency Medicine | Admitting: Emergency Medicine

## 2018-09-07 DIAGNOSIS — R51 Headache: Secondary | ICD-10-CM | POA: Diagnosis not present

## 2018-09-07 DIAGNOSIS — Z87891 Personal history of nicotine dependence: Secondary | ICD-10-CM | POA: Diagnosis not present

## 2018-09-07 DIAGNOSIS — I10 Essential (primary) hypertension: Secondary | ICD-10-CM | POA: Insufficient documentation

## 2018-09-07 DIAGNOSIS — Z7982 Long term (current) use of aspirin: Secondary | ICD-10-CM | POA: Insufficient documentation

## 2018-09-07 DIAGNOSIS — R42 Dizziness and giddiness: Secondary | ICD-10-CM | POA: Diagnosis not present

## 2018-09-07 DIAGNOSIS — S0990XA Unspecified injury of head, initial encounter: Secondary | ICD-10-CM | POA: Insufficient documentation

## 2018-09-07 DIAGNOSIS — Y9389 Activity, other specified: Secondary | ICD-10-CM | POA: Diagnosis not present

## 2018-09-07 DIAGNOSIS — Y929 Unspecified place or not applicable: Secondary | ICD-10-CM | POA: Diagnosis not present

## 2018-09-07 DIAGNOSIS — Z79899 Other long term (current) drug therapy: Secondary | ICD-10-CM | POA: Diagnosis not present

## 2018-09-07 DIAGNOSIS — S299XXA Unspecified injury of thorax, initial encounter: Secondary | ICD-10-CM | POA: Diagnosis not present

## 2018-09-07 DIAGNOSIS — Y999 Unspecified external cause status: Secondary | ICD-10-CM | POA: Diagnosis not present

## 2018-09-07 LAB — CBG MONITORING, ED: Glucose-Capillary: 92 mg/dL (ref 70–99)
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
## 2018-09-07 NOTE — ED Notes (Signed)
Xray here for pt

## 2018-09-07 NOTE — ED Notes (Signed)
Pt. Ambulated to bathroom without help; steady gait

## 2018-09-07 NOTE — Telephone Encounter (Signed)
Noted  
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
## 2018-09-07 NOTE — Telephone Encounter (Signed)
No charge. 

## 2018-09-07 NOTE — ED Notes (Signed)
C Collar removed by EDP 

## 2018-09-17 ENCOUNTER — Ambulatory Visit (INDEPENDENT_AMBULATORY_CARE_PROVIDER_SITE_OTHER): Payer: Medicare Other | Admitting: Internal Medicine

## 2018-09-17 ENCOUNTER — Encounter: Payer: Self-pay | Admitting: Internal Medicine

## 2018-09-17 VITALS — BP 116/80 | HR 57 | Temp 98.1°F | Resp 16 | Ht 70.0 in | Wt 182.4 lb

## 2018-09-17 DIAGNOSIS — G4700 Insomnia, unspecified: Secondary | ICD-10-CM

## 2018-09-17 DIAGNOSIS — R739 Hyperglycemia, unspecified: Secondary | ICD-10-CM | POA: Diagnosis not present

## 2018-09-17 DIAGNOSIS — I701 Atherosclerosis of renal artery: Secondary | ICD-10-CM

## 2018-09-17 DIAGNOSIS — Z23 Encounter for immunization: Secondary | ICD-10-CM | POA: Diagnosis not present

## 2018-09-17 DIAGNOSIS — Z0000 Encounter for general adult medical examination without abnormal findings: Secondary | ICD-10-CM

## 2018-09-17 DIAGNOSIS — Z79899 Other long term (current) drug therapy: Secondary | ICD-10-CM | POA: Diagnosis not present

## 2018-09-17 DIAGNOSIS — E785 Hyperlipidemia, unspecified: Secondary | ICD-10-CM | POA: Diagnosis not present

## 2018-09-17 LAB — CBC WITH DIFFERENTIAL/PLATELET
BASOS PCT: 0.8 % (ref 0.0–3.0)
Basophils Absolute: 0.1 10*3/uL (ref 0.0–0.1)
EOS ABS: 0.3 10*3/uL (ref 0.0–0.7)
Eosinophils Relative: 2.7 % (ref 0.0–5.0)
HCT: 42.5 % (ref 39.0–52.0)
Hemoglobin: 14.3 g/dL (ref 13.0–17.0)
Lymphocytes Relative: 20.3 % (ref 12.0–46.0)
Lymphs Abs: 2.2 10*3/uL (ref 0.7–4.0)
MCHC: 33.5 g/dL (ref 30.0–36.0)
MCV: 88.3 fl (ref 78.0–100.0)
MONO ABS: 0.9 10*3/uL (ref 0.1–1.0)
Monocytes Relative: 8.6 % (ref 3.0–12.0)
NEUTROS ABS: 7.3 10*3/uL (ref 1.4–7.7)
Neutrophils Relative %: 67.6 % (ref 43.0–77.0)
PLATELETS: 214 10*3/uL (ref 150.0–400.0)
RBC: 4.82 Mil/uL (ref 4.22–5.81)
RDW: 13.2 % (ref 11.5–15.5)
WBC: 10.9 10*3/uL — ABNORMAL HIGH (ref 4.0–10.5)

## 2018-09-17 LAB — LIPID PANEL
CHOLESTEROL: 119 mg/dL (ref 0–200)
HDL: 36.1 mg/dL — AB (ref >39.00)
LDL CALC: 57 mg/dL (ref 0–99)
NonHDL: 83.2
TRIGLYCERIDES: 133 mg/dL (ref 0.0–149.0)
Total CHOL/HDL Ratio: 3
VLDL: 26.6 mg/dL (ref 0.0–40.0)

## 2018-09-17 LAB — BASIC METABOLIC PANEL
BUN: 24 mg/dL — ABNORMAL HIGH (ref 6–23)
CO2: 29 meq/L (ref 19–32)
Calcium: 9.2 mg/dL (ref 8.4–10.5)
Chloride: 107 mEq/L (ref 96–112)
Creatinine, Ser: 1.36 mg/dL (ref 0.40–1.50)
GFR: 50.7 mL/min — ABNORMAL LOW (ref >60.00)
GLUCOSE: 97 mg/dL (ref 70–99)
POTASSIUM: 4.1 meq/L (ref 3.5–5.1)
SODIUM: 143 meq/L (ref 135–145)

## 2018-09-17 LAB — HEMOGLOBIN A1C: HEMOGLOBIN A1C: 5.5 % (ref 4.6–6.5)

## 2018-09-17 LAB — AST: AST: 16 U/L (ref 0–37)

## 2018-09-17 LAB — ALT: ALT: 13 U/L (ref 0–53)

## 2018-09-17 MED ORDER — ZOSTER VAC RECOMB ADJUVANTED 50 MCG/0.5ML IM SUSR: 0.5000 mL | Freq: Once | INTRAMUSCULAR | 1 refills | Status: AC
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
## 2018-09-17 NOTE — Patient Instructions (Addendum)
Please schedule Medicare Wellness with Glenard Haring.   GO TO THE LAB : Get the blood work     GO TO THE FRONT DESK Schedule your next appointment    for a checkup in 6 months  Please contact Dr. Carlean Purl office and reschedule your colonoscopy   Check the  blood pressure 2 or 3 times a week at different times of the day Be sure your blood pressure is between 110/65 and  135/85. If it is consistently higher or lower, let me know   HOW TO TAKE YOUR BLOOD PRESSURE:   Rest 5 minutes before taking your blood pressure.   Don't smoke or drink caffeinated beverages for at least 30 minutes before.   Take your blood pressure before (not after) you eat.   Sit comfortably with your back supported and both feet on the floor (don't cross your legs).   Elevate your arm to heart level on a table or a desk.   Use the proper sized cuff. It should fit smoothly and snugly around your bare upper arm. There should be enough room to slip a fingertip under the cuff. The bottom edge of the cuff should be 1 inch above the crease of the elbow.   Ideally, take 3 measurements at one sitting and record the average.
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
## 2018-09-17 NOTE — Progress Notes (Signed)
Pre visit review using our clinic review tool, if applicable. No additional management support is needed unless otherwise documented below in the visit note. 

## 2018-09-19 LAB — PAIN MGMT, PROFILE 8 W/CONF, U
6 Acetylmorphine: NEGATIVE ng/mL (ref <10)
ALPHAHYDROXYTRIAZOLAM: NEGATIVE ng/mL (ref <50)
AMPHETAMINES: NEGATIVE ng/mL (ref <500)
Alcohol Metabolites: NEGATIVE ng/mL (ref <500)
Alphahydroxyalprazolam: NEGATIVE ng/mL (ref <25)
Alphahydroxymidazolam: NEGATIVE ng/mL (ref <50)
Aminoclonazepam: NEGATIVE ng/mL (ref <25)
BUPRENORPHINE, URINE: NEGATIVE ng/mL (ref <5)
Benzodiazepines: POSITIVE ng/mL — AB (ref <100)
Cocaine Metabolite: NEGATIVE ng/mL (ref <150)
Creatinine: 51.1 mg/dL
HYDROXYETHYLFLURAZEPAM: NEGATIVE ng/mL (ref <50)
Lorazepam: NEGATIVE ng/mL (ref <50)
MDMA: NEGATIVE ng/mL (ref <500)
Marijuana Metabolite: NEGATIVE ng/mL (ref <20)
Nordiazepam: 145 ng/mL — ABNORMAL HIGH (ref <50)
OXAZEPAM: 323 ng/mL — AB (ref <50)
OXYCODONE: NEGATIVE ng/mL (ref <100)
Opiates: NEGATIVE ng/mL (ref <100)
Oxidant: NEGATIVE ug/mL (ref <200)
Temazepam: 231 ng/mL — ABNORMAL HIGH (ref <50)
pH: 6.18 (ref 4.5–9.0)

## 2018-09-28 ENCOUNTER — Telehealth: Payer: Self-pay

## 2018-09-28 NOTE — Telephone Encounter (Signed)
Spoke w/ Pt- informed of results and recommendations. Pt verbalized understanding.  

## 2018-09-28 NOTE — Telephone Encounter (Signed)
Advise patient: BUN is 24, slightly elevated but actually better than before. his kidney function (GFR) is appropriate and stable.  These are good results.

## 2018-09-28 NOTE — Telephone Encounter (Signed)
Copied from [place] [id number]. Topic: Quick Communication - Lab Results (Clinic Use ONLY) >> Sep 28, 2018  1:25 PM Lennox Solders wrote: Pt is calling and receive his lab results by mail . Pt is concern BUN is 24 and GFR 50.7

## 2018-09-28 NOTE — Telephone Encounter (Signed)
Please advise 

## 2018-09-30 ENCOUNTER — Other Ambulatory Visit: Payer: Self-pay | Admitting: Internal Medicine

## 2018-10-16 DIAGNOSIS — M542 Cervicalgia: Secondary | ICD-10-CM | POA: Diagnosis not present

## 2018-10-16 DIAGNOSIS — M9901 Segmental and somatic dysfunction of cervical region: Secondary | ICD-10-CM | POA: Diagnosis not present

## 2018-11-16 ENCOUNTER — Telehealth: Payer: Self-pay | Admitting: Internal Medicine

## 2018-11-16 NOTE — Telephone Encounter (Signed)
Sent!

## 2018-11-16 NOTE — Telephone Encounter (Signed)
Diazepam refill.   Last OV: 09/17/2018 Last Fill: 07/29/2018 #30 and 1RF UDS: 09/17/2018 Low risk

## 2018-11-23 ENCOUNTER — Other Ambulatory Visit: Payer: Self-pay

## 2018-11-23 ENCOUNTER — Ambulatory Visit (HOSPITAL_COMMUNITY)
Admission: RE | Admit: 2018-11-23 | Discharge: 2018-11-23 | Disposition: A | Discharge status: Home / Self Care | Payer: Medicare Other | Source: Ambulatory Visit | Attending: Cardiovascular Disease | Admitting: Cardiovascular Disease

## 2018-11-23 DIAGNOSIS — I701 Atherosclerosis of renal artery: Secondary | ICD-10-CM | POA: Diagnosis not present

## 2018-11-26 ENCOUNTER — Telehealth: Payer: Self-pay | Admitting: Cardiovascular Disease

## 2018-11-26 NOTE — Telephone Encounter (Signed)
New message ° ° ° °LMOM for pt to call back about appt on 05.19.20. Will offer pt doxemity video if pt has a smart phone. If no smart phone, will offer phone visit. If pt returns call, please reach out via secure chat. I will speak to pt.  °

## 2018-11-27 NOTE — Telephone Encounter (Signed)
Attempted to reach pt regarding his appt on 5/18, no answer and voicemail is not set up. Calling to confirm if pt would like a video or phone visit for his appt.

## 2018-11-30 ENCOUNTER — Telehealth (INDEPENDENT_AMBULATORY_CARE_PROVIDER_SITE_OTHER): Payer: Medicare Other | Admitting: Cardiovascular Disease

## 2018-11-30 ENCOUNTER — Encounter: Payer: Self-pay | Admitting: Cardiovascular Disease

## 2018-11-30 ENCOUNTER — Telehealth: Payer: Self-pay

## 2018-11-30 ENCOUNTER — Other Ambulatory Visit: Payer: Self-pay

## 2018-11-30 VITALS — Ht 70.0 in

## 2018-11-30 DIAGNOSIS — I701 Atherosclerosis of renal artery: Secondary | ICD-10-CM

## 2018-11-30 DIAGNOSIS — I2510 Atherosclerotic heart disease of native coronary artery without angina pectoris: Secondary | ICD-10-CM

## 2018-11-30 NOTE — Patient Instructions (Signed)
Medication Instructions:  Your provider recommends that you continue on your current medications as directed. Please refer to the Current Medication list given to you today.    Testing/Procedures: Your physician has requested that you have a renal artery duplex in 1 year. During this test, an ultrasound is used to evaluate blood flow to the kidneys. Allow one hour for this exam. Do not eat after midnight the day before and avoid carbonated beverages. Take your medications as you usually do.  Follow-Up: Your provider wants you to follow-up in: 1 year, a couple days after your renal duplex. You will receive a reminder letter in the mail two months in advance. If you don't receive a letter, please call our office to schedule the follow-up appointment.

## 2018-11-30 NOTE — Addendum Note (Signed)
Addended by: Harland German A on: 11/30/2018 11:23 AM   Modules accepted: Orders

## 2018-11-30 NOTE — Telephone Encounter (Signed)
Obtained verbal consent from pt for phone visit today for appt.   YOUR CARDIOLOGY TEAM HAS ARRANGED FOR AN E-VISIT FOR YOUR APPOINTMENT - PLEASE REVIEW IMPORTANT INFORMATION BELOW SEVERAL DAYS PRIOR TO YOUR APPOINTMENT  Due to the recent COVID-19 pandemic, we are transitioning in-person office visits to tele-medicine visits in an effort to decrease unnecessary exposure to our patients, their families, and staff. These visits are billed to your insurance just like a normal visit is. We also encourage you to sign up for MyChart if you have not already done so. You will need a smartphone if possible. For patients that do not have this, we can still complete the visit using a regular telephone but do prefer a smartphone to enable video when possible. You may have a family member that lives with you that can help. If possible, we also ask that you have a blood pressure cuff and scale at home to measure your blood pressure, heart rate and weight prior to your scheduled appointment. Patients with clinical needs that need an in-person evaluation and testing will still be able to come to the office if absolutely necessary. If you have any questions, feel free to call our office.     YOUR PROVIDER WILL BE USING THE FOLLOWING PLATFORM TO COMPLETE YOUR VISIT: Doximity . IF USING MYCHART - How to Download the MyChart App to Your SmartPhone   - If Apple, go to [employer] and type in MyChart in the search bar and download the app. If Android, ask patient to go to [employer] and type in [place] in the search bar and download the app. The app is free but as with any other app downloads, your phone may require you to verify saved payment information or Apple/Android password.  - You will need to then log into the app with your MyChart username and password, and select [facility] as your healthcare provider to link the account.  - When it is time for your visit, go to the MyChart app, find appointments, and click  Begin Video Visit. Be sure to Select Allow for your device to access the Microphone and Camera for your visit. You will then be connected, and your provider will be with you shortly.  **If you have any issues connecting or need assistance, please contact MyChart service desk [phone])**  **If using a computer, in order to ensure the best quality for your visit, you will need to use either of the following Internet Browsers: Insurance underwriter or [employer]**  . IF USING DOXIMITY or DOXY.ME - The staff will give you instructions on receiving your link to join the meeting the day of your visit.      2-3 DAYS BEFORE YOUR APPOINTMENT  You will receive a telephone call from one of our [place] team members - your caller ID may say "Unknown caller." If this is a video visit, we will walk you through how to get the video launched on your phone. We will remind you check your blood pressure, heart rate and weight prior to your scheduled appointment. If you have an Apple Watch or Kardia, please upload any pertinent ECG strips the day before or morning of your appointment to [place]. Our staff will also make sure you have reviewed the consent and agree to move forward with your scheduled tele-health visit.     THE DAY OF YOUR APPOINTMENT  Approximately 15 minutes prior to your scheduled appointment, you will receive a telephone call from one  of [facility] team - your caller ID may say "Unknown caller."  Our staff will confirm medications, vital signs for the day and any symptoms you may be experiencing. Please have this information available prior to the time of visit start. It may also be helpful for you to have a pad of paper and pen handy for any instructions given during your visit. They will also walk you through joining the smartphone meeting if this is a video visit.    CONSENT FOR TELE-HEALTH VISIT - PLEASE REVIEW  I hereby voluntarily request, consent and authorize [facility] and its employed or contracted physicians, physician assistants, nurse practitioners or other licensed health care professionals (the Practitioner), to provide me with telemedicine health care services (the "Services") as deemed necessary by the treating Practitioner. I acknowledge and consent to receive the Services by the Practitioner via telemedicine. I understand that the telemedicine visit will involve communicating with the Practitioner through live audiovisual communication technology and the disclosure of certain medical information by electronic transmission. I acknowledge that I have been given the opportunity to request an in-person assessment or other available alternative prior to the telemedicine visit and am voluntarily participating in the telemedicine visit.  I understand that I have the right to withhold or withdraw my consent to the use of telemedicine in the course of my care at any time, without affecting my right to future care or treatment, and that the Practitioner or I may terminate the telemedicine visit at any time. I understand that I have the right to inspect all information obtained and/or recorded in the course of the telemedicine visit and may receive copies of available information for a reasonable fee.  I understand that some of the potential risks of receiving the Services via telemedicine include:  Marland Kitchen Delay or interruption in medical evaluation due to technological equipment failure or disruption; . Information transmitted may not be sufficient (e.g. poor resolution of images) to allow for appropriate medical decision making by the Practitioner; and/or  . In rare instances, security protocols could fail, causing a breach of personal health information.  Furthermore, I acknowledge that it is my responsibility to provide information about my medical history, conditions and care that is complete and accurate to the best of my ability. I acknowledge that Practitioner's  advice, recommendations, and/or decision may be based on factors not within their control, such as incomplete or inaccurate data provided by me or distortions of diagnostic images or specimens that may result from electronic transmissions. I understand that the practice of medicine is not an exact science and that Practitioner makes no warranties or guarantees regarding treatment outcomes. I acknowledge that I will receive a copy of this consent concurrently upon execution via email to the email address I last provided but may also request a printed copy by calling the office of [facility].    I understand that my insurance will be billed for this visit.   I have read or had this consent read to me. . I understand the contents of this consent, which adequately explains the benefits and risks of the Services being provided via telemedicine.  . I have been provided ample opportunity to ask questions regarding this consent and the Services and have had my questions answered to my satisfaction. . I give my informed consent for the services to be provided through the use of telemedicine in my medical care  By participating in this telemedicine visit I agree to the above.

## 2018-11-30 NOTE — Progress Notes (Signed)
Virtual Visit via Telephone Note   This visit type was conducted due to national recommendations for restrictions regarding the COVID-19 Pandemic (e.g. social distancing) in an effort to limit this patient's exposure and mitigate transmission in our community.  Due to his co-morbid illnesses, this patient is at least at moderate risk for complications without adequate follow up.  This format is felt to be most appropriate for this patient at this time.  The patient did not have access to video technology/had technical difficulties with video requiring transitioning to audio format only (telephone).  All issues noted in this document were discussed and addressed.  No physical exam could be performed with this format.  Please refer to the patient's chart for his  consent to telehealth for [facility].   Date:  11/30/2018   ID:  Dustin Ashley, DOB [birth year], MRN [id number]  Patient Location: Home Provider Location: Home  PCP:  Colon Branch, MD  Cardiologist:  Sherren Mocha, MD  Electrophysiologist:  None   Evaluation Performed:  Follow-Up Visit  Chief Complaint:  Follow-up renal artery stenosis  History of Present Illness:    Dustin Ashley is a 78 y.o. male with history of renal artery stenosis, coronary artery disease status post CABG, and hypertension, presenting for follow-up evaluation via telephone in light of the current COVID-19 pandemic.  The patient does not have symptoms concerning for COVID-19 infection (fever, chills, cough, or new shortness of breath).   The patient reports no interval cardiac symptoms since his last visit 1 year ago.  He specifically denies chest pain, chest pressure, shortness of breath, leg swelling, or heart palpitations.  He continues to follow regularly with Dr. Larose Kells and his most recent labs are reviewed.  His lipids remain in an ideal range on statin therapy.  Unfortunately his wife died last year and a single vehicle car accident.  He was enjoying  exercise at the [facility], but this has close down with the COVID-19 pandemic.  He looks forward to reopening so that he can begin his regular exercise again.   Past Medical History:  Diagnosis Date  . Allergic rhinitis   . Allergy    seasonal  . Arthritis   . 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 CAD (coronary artery disease)   . Cataract   . Chronic renal insufficiency, stage II (mild)    Cr ~ 1.4, u/s 4-12 normal kidneys  . Gallstone pancreatitis 2015  . GERD (gastroesophageal reflux disease)   . HOH (hard of hearing)    has a hearing aid L, sees audiology rountinely  . Hyperglycemia    A1C 5.8 04-2010  . Hyperlipemia   . Hypertension   . Pain, joint, multiple sites    uses valium, occ uses for insomnia. uses for shoulder  pain  . Paroxysmal atrial fibrillation (HCC)   . Personal history of colonic polyps   . Pulmonary emboli ([place])    02-2014  . RAS (renal artery stenosis) ([place]) 05-2012   . Renal insufficiency    Past Surgical History:  Procedure Laterality Date  . CARDIAC CATHETERIZATION    . CHOLECYSTECTOMY N/A 02/15/2014   Procedure: LAPAROSCOPIC CHOLECYSTECTOMY with IOC;  Surgeon: Gayland Curry, MD;  Location: [place];  Service: General;  Laterality: N/A;  . CORONARY ARTERY BYPASS GRAFT  1995  . ESOPHAGOGASTRODUODENOSCOPY N/A 03/02/2014   Procedure: ESOPHAGOGASTRODUODENOSCOPY (EGD);  Surgeon: Ladene Artist, MD;  Location: [facility] ENDOSCOPY;  Service: Endoscopy;  Laterality: N/A;  sarah/leone  Current Meds  Medication Sig  . acetaminophen (TYLENOL) 325 MG tablet Take 2 tablets (650 mg total) by mouth every 6 (six) hours as needed for mild pain, moderate pain, fever or headache.  Marland Kitchen amLODipine (NORVASC) 10 MG tablet Take 1 tablet (10 mg total) by mouth daily.  Marland Kitchen aspirin 81 MG tablet Take 81 mg by mouth daily.  Marland Kitchen atenolol (TENORMIN) 50 MG tablet Take 1.5 tablets (75 mg total) by mouth daily.  Marland Kitchen atorvastatin (LIPITOR) 20 MG tablet Take 1 tablet (20 mg total) by mouth daily.  . benazepril (LOTENSIN)  20 MG tablet Take 1 tablet (20 mg total) by mouth daily.  . diazepam (VALIUM) 10 MG tablet Take 1 tablet (10 mg total) by mouth every 12 (twelve) hours as needed for anxiety or sleep.  Marland Kitchen docusate sodium (COLACE) 100 MG capsule Take 100 mg by mouth daily as needed (constipation).  . fexofenadine (ALLEGRA) 180 MG tablet Take 180 mg by mouth daily.       Allergies:   Hydrocodone-acetaminophen and Tramadol hcl   Social History   Tobacco Use  . Smoking status: Former Smoker    Last attempt to quit: 03/13/1976    Years since quitting: 42.7  . Smokeless tobacco: Never Used  Substance Use Topics  . Alcohol use: No    Alcohol/week: 0.0 standard drinks  . Drug use: No     Family Hx: The patient's family history includes Heart attack in his brother; Hypertension in his father. There is no history of Diabetes, Colon cancer, Prostate cancer, Rectal cancer, or Stomach cancer.  ROS:   Please see the history of present illness.    All other systems reviewed and are negative.   Prior CV studies:   The following studies were reviewed today:  Renal arterial doppler 11/23/2018: Summary: Renal:   Right: Normal size right kidney. Abnormal right Resistive Index.        Abnormal cortical thickness of right kidney.        >60% stenosis of the right renal artery, based on velocities.        RRV flow present; pulsatile venous flow. Left:  Normal size of left kidney. Abnormal left Resistive Index.        Abnormal cortical thickness of the left kidney.        >60% stenosis of the left renal artery, based on velocities.        LRV flow present; pulsatile venous flow. Mesenteric: 70 to 99% stenosis in the celiac artery and superior mesenteric artery.   Patent IVC.  Labs/Other Tests and Data Reviewed:    EKG:  An ECG dated 09/07/2018 was personally reviewed today and demonstrated:  Normal sinus rhythm 62 bpm, within normal limits  Recent Labs: 09/17/2018: ALT 13; BUN 24; Creatinine, Ser 1.36;  Hemoglobin 14.3; Platelets 214.0; Potassium 4.1; Sodium 143   Recent Lipid Panel Lab Results  Component Value Date/Time   CHOL 119 09/17/2018 09:44 AM   TRIG 133.0 09/17/2018 09:44 AM   TRIG 92 06/26/2006 10:42 AM   HDL 36.10 (L) 09/17/2018 09:44 AM   CHOLHDL 3 09/17/2018 09:44 AM   LDLCALC 57 09/17/2018 09:44 AM    Wt Readings from Last 3 Encounters:  09/17/18 182 lb 6 oz (82.7 kg)  09/07/18 180 lb (81.6 kg)  08/24/18 181 lb 9.6 oz (82.4 kg)     Objective:    Vital Signs:  Ht 5\' 10"  (1.778 m)   BMI 26.17 kg/m    VITAL SIGNS:  reviewed  The patient is alert and oriented, no acute distress.  He is breathing comfortably with conversation.  Remaining exam not performed as this is a virtual/telephone visit  ASSESSMENT & PLAN:    1. Coronary artery disease, native vessel, without angina: The patient seems to be doing very well from a cardiac perspective.  He is treated with aspirin and a statin drug.  I will see him back in 1 year. 2. Hypertension with CKD stage III: Renal function stable on most recent labs with creatinine approximately 1.3 mg/dL.  He is on a good medical regimen outlined above.  Blood pressure is well controlled. 3. Bilateral renal artery stenosis: Renal Doppler study reviewed with severe bilateral renal artery stenosis, unchanged from past studies.  Peak systolic velocity approximately 4 m/s in the proximal renal arteries.  Kidney size remains normal in both kidneys.  As we have followed this for many years with stable renal function, well-controlled blood pressure, and stable kidney size, I am inclined to continue with medical therapy.  I would like to see the patient back in 1 year with a repeat renal arterial duplex before his office visit.  COVID-19 Education: The signs and symptoms of COVID-19 were discussed with the patient and how to seek care for testing (follow up with PCP or arrange E-visit).  The importance of social distancing was discussed today.  Time:    Today, I have spent 12 minutes with the patient with telehealth technology discussing the above problems.     Medication Adjustments/Labs and Tests Ordered: Current medicines are reviewed at length with the patient today.  Concerns regarding medicines are outlined above.   Tests Ordered: No orders of the defined types were placed in this encounter.   Medication Changes: No orders of the defined types were placed in this encounter.   Disposition:  Follow up in 1 year(s) with a renal arterial doppler before the visit  Signed, Sherren Mocha, MD  11/30/2018 10:39 AM    [place]

## 2019-02-04 ENCOUNTER — Other Ambulatory Visit: Payer: Self-pay | Admitting: Internal Medicine

## 2019-02-17 ENCOUNTER — Encounter: Payer: Self-pay | Admitting: Internal Medicine

## 2019-02-22 ENCOUNTER — Ambulatory Visit: Payer: Medicare Other | Admitting: *Deleted

## 2019-02-22 ENCOUNTER — Other Ambulatory Visit: Payer: Self-pay

## 2019-02-22 VITALS — Temp 97.7°F | Ht 69.0 in | Wt 184.0 lb

## 2019-02-22 DIAGNOSIS — Z86010 Personal history of colonic polyps: Secondary | ICD-10-CM

## 2019-02-22 MED ORDER — METOCLOPRAMIDE HCL 10 MG PO TABS: 10.0000 mg | ORAL_TABLET | ORAL | 0 refills | Status: DC

## 2019-02-22 NOTE — Progress Notes (Signed)
No egg or soy allergy known to patient  No issues with past sedation with any surgeries  or procedures, no intubation problems  No diet pills per patient No home 02 use per patient  No blood thinners per patient  Pt denies issues with constipation  Hx of A fib or A flutter  EMMI  Information given Patient stating he can not take gateraid or suprep, related to vomiting Lenard Galloway, RN spoke with Dr Carlean Purl, received an order for Reglan for the patient 30 minutes prior to each prep. Patient verbalized understanding

## 2019-03-03 ENCOUNTER — Encounter: Payer: Self-pay | Admitting: Internal Medicine

## 2019-03-03 ENCOUNTER — Other Ambulatory Visit: Payer: Self-pay | Admitting: Internal Medicine

## 2019-03-04 ENCOUNTER — Telehealth: Payer: Self-pay

## 2019-03-04 NOTE — Telephone Encounter (Signed)
Covid-19 screening questions   Do you now or have you had a fever in the last 14 days? NO   Do you have any respiratory symptoms of shortness of breath or cough now or in the last 14 days? NO  Do you have any family members or close contacts with diagnosed or suspected Covid-19 in the past 14 days? NO  Have you been tested for Covid-19 and found to be positive? NO        

## 2019-03-05 ENCOUNTER — Encounter: Payer: Self-pay | Admitting: Internal Medicine

## 2019-03-05 ENCOUNTER — Ambulatory Visit (AMBULATORY_SURGERY_CENTER): Payer: Medicare Other | Admitting: Internal Medicine

## 2019-03-05 ENCOUNTER — Other Ambulatory Visit: Payer: Self-pay

## 2019-03-05 VITALS — BP 125/60 | HR 50 | Temp 98.2°F | Resp 10 | Ht 69.0 in | Wt 184.0 lb

## 2019-03-05 DIAGNOSIS — Z86010 Personal history of colonic polyps: Secondary | ICD-10-CM

## 2019-03-05 DIAGNOSIS — D124 Benign neoplasm of descending colon: Secondary | ICD-10-CM

## 2019-03-05 DIAGNOSIS — D122 Benign neoplasm of ascending colon: Secondary | ICD-10-CM

## 2019-03-05 DIAGNOSIS — Z1211 Encounter for screening for malignant neoplasm of colon: Secondary | ICD-10-CM | POA: Diagnosis not present

## 2019-03-05 MED ORDER — SODIUM CHLORIDE 0.9 % IV SOLN: 500.0000 mL | Freq: Once | INTRAVENOUS | Status: DC

## 2019-03-05 NOTE — Progress Notes (Signed)
Called to room to assist during endoscopic procedure.  Patient ID and intended procedure confirmed with present staff. Received instructions for my participation in the procedure from the performing physician.  

## 2019-03-05 NOTE — Op Note (Signed)
[facility] Patient Name: Dustin Ashley Procedure Date: 03/05/2019 10:00 AM MRN: [id number] Endoscopist: Gatha Mayer , MD Age: 78 Referring MD:  Date of Birth: [birth year] Gender: Male Account #: [id number] Procedure:                Colonoscopy Indications:              Surveillance: Personal history of adenomatous                            polyps on last colonoscopy > 3 years ago Medicines:                Propofol per Anesthesia, Monitored Anesthesia Care Procedure:                Pre-Anesthesia Assessment:                           - Prior to the procedure, a History and Physical                            was performed, and patient medications and                            allergies were reviewed. The patient's tolerance of                            previous anesthesia was also reviewed. The risks                            and benefits of the procedure and the sedation                            options and risks were discussed with the patient.                            All questions were answered, and informed consent                            was obtained. Prior Anticoagulants: The patient has                            taken no previous anticoagulant or antiplatelet                            agents. ASA Grade Assessment: II - A patient with                            mild systemic disease. After reviewing the risks                            and benefits, the patient was deemed in                            satisfactory condition to undergo the procedure.  After obtaining informed consent, the colonoscope                            was passed under direct vision. Throughout the                            procedure, the patient's blood pressure, pulse, and                            oxygen saturations were monitored continuously. The                            Colonoscope was introduced through the anus and   advanced to the the cecum, identified by                            appendiceal orifice and ileocecal valve. The                            colonoscopy was performed without difficulty. The                            patient tolerated the procedure well. The quality                            of the bowel preparation was adequate. The bowel                            preparation used was Miralax via split dose                            instruction. The ileocecal valve, appendiceal                            orifice, and rectum were photographed. Scope In: 10:13:27 AM Scope Out: 10:30:16 AM Scope Withdrawal Time: 0 hours 13 minutes 49 seconds  Total Procedure Duration: 0 hours 16 minutes 49 seconds  Findings:                 The perianal and digital rectal examinations were                            normal. Pertinent negatives include normal prostate                            (size, shape, and consistency).                           Three sessile polyps were found in the descending                            colon and ascending colon. The polyps were                            diminutive in size. These polyps were  removed with                            a cold snare. Resection and retrieval were                            complete. Verification of patient identification                            for the specimen was done. Estimated blood loss was                            minimal.                           Multiple diverticula were found in the sigmoid                            colon.                           The exam was otherwise without abnormality on                            direct and retroflexion views. Complications:            No immediate complications. Estimated Blood Loss:     Estimated blood loss was minimal. Impression:               - Three diminutive polyps in the descending colon                            and in the ascending colon, removed with a cold                             snare. Resected and retrieved.                           - Diverticulosis in the sigmoid colon.                           - The examination was otherwise normal on direct                            and retroflexion views.                           - Personal history of colonic polyps. Recommendation:           - Patient has a contact number available for                            emergencies. The signs and symptoms of potential                            delayed complications were discussed with the  patient. Return to normal activities tomorrow.                            Written discharge instructions were provided to the                            patient.                           - Resume previous diet.                           - Continue present medications.                           - No repeat colonoscopy due to age. Gatha Mayer, MD 03/05/2019 10:36:30 AM This report has been signed electronically.

## 2019-03-05 NOTE — Patient Instructions (Addendum)
I found and removed 3 tiny polyps. I do not anticipate I will recommend another routine colonoscopy. I will let you know.   I appreciate the opportunity to care for you. Gatha Mayer, MD, FACG  YOU HAD AN ENDOSCOPIC PROCEDURE TODAY AT [facility]:   Refer to the procedure report that was given to you for any specific questions about what was found during the examination.  If the procedure report does not answer your questions, please call your gastroenterologist to clarify.  If you requested that your care partner not be given the details of your procedure findings, then the procedure report has been included in a sealed envelope for you to review at your convenience later.  YOU SHOULD EXPECT: Some feelings of bloating in the abdomen. Passage of more gas than usual.  Walking can help get rid of the air that was put into your GI tract during the procedure and reduce the bloating. If you had a lower endoscopy (such as a colonoscopy or flexible sigmoidoscopy) you may notice spotting of blood in your stool or on the toilet paper. If you underwent a bowel prep for your procedure, you may not have a normal bowel movement for a few days.  Please Note:  You might notice some irritation and congestion in your nose or some drainage.  This is from the oxygen used during your procedure.  There is no need for concern and it should clear up in a day or so.  SYMPTOMS TO REPORT IMMEDIATELY:   Following lower endoscopy (colonoscopy or flexible sigmoidoscopy):  Excessive amounts of blood in the stool  Significant tenderness or worsening of abdominal pains  Swelling of the abdomen that is new, acute  Fever of 100F or higher  For urgent or emergent issues, a gastroenterologist can be reached at any hour by calling [phone].  DIET:  We do recommend a small meal at first, but then you may proceed to your regular diet.  Drink plenty of fluids but you should avoid alcoholic  beverages for 24 hours.  ACTIVITY:  You should plan to take it easy for the rest of today and you should NOT DRIVE or use heavy machinery until tomorrow (because of the sedation medicines used during the test).    FOLLOW UP: Our staff will call the number listed on your records 48-72 hours following your procedure to check on you and address any questions or concerns that you may have regarding the information given to you following your procedure. If we do not reach you, we will leave a message.  We will attempt to reach you two times.  During this call, we will ask if you have developed any symptoms of COVID 19. If you develop any symptoms (ie: fever, flu-like symptoms, shortness of breath, cough etc.) before then, please call [phone].  If you test positive for Covid 19 in the 2 weeks post procedure, please call and report this information to [place].    If any biopsies were taken you will be contacted by phone or by letter within the next 1-3 weeks.  Please call us at [phone] if you have not heard about the biopsies in 3 weeks.   SIGNATURES/CONFIDENTIALITY: You and/or your care partner have signed paperwork which will be entered into your electronic medical record.  These signatures attest to the fact that that the information above on your After Visit Summary has been reviewed and is understood.  Full responsibility of the confidentiality of  this discharge information lies with you and/or your care-partner.  Please read over handouts about polyps and diverticulosis  No repeat colonoscopy  Continue your normal medications

## 2019-03-05 NOTE — Progress Notes (Signed)
Pt's states no medical or surgical changes since previsit or office visit.  JB temps and CW vitals. 

## 2019-03-09 ENCOUNTER — Telehealth: Payer: Self-pay | Admitting: *Deleted

## 2019-03-09 NOTE — Telephone Encounter (Signed)
  Follow up Call-  Call back number 03/05/2019  Post procedure Call Back phone  # [phone]  Permission to leave phone message Yes  Some recent data might be hidden     Patient questions:  Do you have a fever, pain , or abdominal swelling? No. Pain Score  0 *  Have you tolerated food without any problems? Yes.    Have you been able to return to your normal activities? Yes.    Do you have any questions about your discharge instructions: Diet   No. Medications  No. Follow up visit  No.  Do you have questions or concerns about your Care? No.  Actions: * If pain score is 4 or above: No action needed, pain <4.

## 2019-03-09 NOTE — Telephone Encounter (Signed)
  Follow up Call-  Call back number 03/05/2019  Post procedure Call Back phone  # [phone]  Permission to leave phone message Yes  Some recent data might be hidden     Patient questions:  VM not set up yet.

## 2019-03-12 ENCOUNTER — Encounter: Payer: Self-pay | Admitting: Internal Medicine

## 2019-03-12 DIAGNOSIS — Z86010 Personal history of colonic polyps: Secondary | ICD-10-CM

## 2019-03-12 NOTE — Progress Notes (Signed)
3 adenomas No recall age

## 2019-03-23 ENCOUNTER — Encounter: Payer: Self-pay | Admitting: Internal Medicine

## 2019-03-23 ENCOUNTER — Ambulatory Visit (INDEPENDENT_AMBULATORY_CARE_PROVIDER_SITE_OTHER): Payer: Medicare Other | Admitting: Internal Medicine

## 2019-03-23 ENCOUNTER — Other Ambulatory Visit: Payer: Self-pay

## 2019-03-23 VITALS — BP 170/58 | HR 53 | Temp 97.7°F | Resp 16 | Ht 69.0 in | Wt 180.5 lb

## 2019-03-23 DIAGNOSIS — R7989 Other specified abnormal findings of blood chemistry: Secondary | ICD-10-CM | POA: Diagnosis not present

## 2019-03-23 DIAGNOSIS — Z23 Encounter for immunization: Secondary | ICD-10-CM

## 2019-03-23 DIAGNOSIS — I10 Essential (primary) hypertension: Secondary | ICD-10-CM | POA: Diagnosis not present

## 2019-03-23 DIAGNOSIS — I701 Atherosclerosis of renal artery: Secondary | ICD-10-CM | POA: Diagnosis not present

## 2019-03-23 LAB — COMPREHENSIVE METABOLIC PANEL
ALT: 16 U/L (ref 0–53)
AST: 17 U/L (ref 0–37)
Albumin: 4.2 g/dL (ref 3.5–5.2)
Alkaline Phosphatase: 87 U/L (ref 39–117)
BUN: 32 mg/dL — ABNORMAL HIGH (ref 6–23)
CO2: 28 mEq/L (ref 19–32)
Calcium: 9.6 mg/dL (ref 8.4–10.5)
Chloride: 105 mEq/L (ref 96–112)
Creatinine, Ser: 1.45 mg/dL (ref 0.40–1.50)
GFR: 47.02 mL/min — ABNORMAL LOW (ref >60.00)
Glucose, Bld: 93 mg/dL (ref 70–99)
Potassium: 4.5 mEq/L (ref 3.5–5.1)
Sodium: 141 mEq/L (ref 135–145)
Total Bilirubin: 0.8 mg/dL (ref 0.2–1.2)
Total Protein: 7 g/dL (ref 6.0–8.3)

## 2019-03-23 LAB — TSH: TSH: 1.65 u[IU]/mL (ref 0.35–4.50)

## 2019-03-23 NOTE — Progress Notes (Signed)
Subjective:    Patient ID: Dustin Ashley, male    DOB: August 25, 1940, 78 y.o.   MRN: [id number]  DOS:  03/23/2019 Type of visit - description: Routine office visit Notes from cardiology and GI reviewed Feeling well BP today slightly elevated but typically okay.  Good med compliance  BP Readings from Last 3 Encounters:  03/23/19 (!) 170/58  03/05/19 125/60  09/17/18 116/80   Wt Readings from Last 3 Encounters:  03/23/19 180 lb 8 oz (81.9 kg)  03/05/19 184 lb (83.5 kg)  02/22/19 184 lb (83.5 kg)     Review of Systems  Denies chest pain no difficulty breathing No edema No nausea, vomiting, diarrhea Past Medical History:  Diagnosis Date  . Allergic rhinitis   . Allergy    seasonal  . Arthritis   . 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 CAD (coronary artery disease)   . Cataract   . Chronic renal insufficiency, stage II (mild)    Cr ~ 1.4, u/s 4-12 normal kidneys  . Gallstone pancreatitis 2015  . GERD (gastroesophageal reflux disease)   . HOH (hard of hearing)    has a hearing aid L, sees audiology rountinely  . Hyperglycemia    A1C 5.8 04-2010  . Hyperlipemia   . Hypertension   . Pain, joint, multiple sites    uses valium, occ uses for insomnia. uses for shoulder  pain  . Paroxysmal atrial fibrillation (HCC)   . Personal history of colonic polyps   . Pulmonary emboli ([place])    02-2014  . RAS (renal artery stenosis) ([place]) 05-2012   . Renal insufficiency     Past Surgical History:  Procedure Laterality Date  . CARDIAC CATHETERIZATION    . CATARACT EXTRACTION    . CHOLECYSTECTOMY N/A 02/15/2014   Procedure: LAPAROSCOPIC CHOLECYSTECTOMY with IOC;  Surgeon: Gayland Curry, MD;  Location: [place];  Service: General;  Laterality: N/A;  . COLONOSCOPY    . CORONARY ARTERY BYPASS GRAFT  1995  . ESOPHAGOGASTRODUODENOSCOPY N/A 03/02/2014   Procedure: ESOPHAGOGASTRODUODENOSCOPY (EGD);  Surgeon: Ladene Artist, MD;  Location: [facility] ENDOSCOPY;  Service: Endoscopy;  Laterality: N/A;  sarah/leone     Social History   Socioeconomic History  . Marital status: Widowed    Spouse name: Not on file  . Number of children: 1  . Years of education: Not on file  . Highest education level: Not on file  Occupational History  . Occupation: retired, Artist  Social Needs  . Financial resource strain: Not on file  . Food insecurity    Worry: Not on file    Inability: Not on file  . Transportation needs    Medical: Not on file    Non-medical: Not on file  Tobacco Use  . Smoking status: Former Smoker    Quit date: 03/13/1976    Years since quitting: 43.0  . Smokeless tobacco: Never Used  Substance and Sexual Activity  . Alcohol use: No    Alcohol/week: 0.0 standard drinks  . Drug use: No  . Sexual activity: Not on file  Lifestyle  . Physical activity    Days per week: Not on file    Minutes per session: Not on file  . Stress: Not on file  Relationships  . Social Herbalist on phone: Not on file    Gets together: Not on file    Attends religious service: Not on file    Active member of club or organization: Not  on file    Attends meetings of clubs or organizations: Not on file    Relationship status: Not on file  . Intimate partner violence    Fear of current or ex partner: Not on file    Emotionally abused: Not on file    Physically abused: Not on file    Forced sexual activity: Not on file  Other Topics Concern  . Not on file  Social History Narrative   Born in [place], has a large extended family, oldest of several brothers-sister    Has one son    Lost wife 2019.       Allergies as of 03/23/2019      Reactions   Hydrocodone-acetaminophen Other (See Comments)   REACTION: bladder obstruction   Tramadol Hcl Other (See Comments)   REACTION: bladder obstruction      Medication List       Accurate as of March 23, 2019 11:59 PM. If you have any questions, ask your nurse or doctor.        acetaminophen 325 MG tablet Commonly known as:  TYLENOL Take 2 tablets (650 mg total) by mouth every 6 (six) hours as needed for mild pain, moderate pain, fever or headache.   amLODipine 10 MG tablet Commonly known as: NORVASC Take 1 tablet (10 mg total) by mouth daily.   aspirin 81 MG tablet Take 81 mg by mouth daily.   atenolol 50 MG tablet Commonly known as: TENORMIN Take 1.5 tablets (75 mg total) by mouth daily.   atorvastatin 20 MG tablet Commonly known as: LIPITOR Take 1 tablet (20 mg total) by mouth daily.   benazepril 20 MG tablet Commonly known as: LOTENSIN Take 1 tablet (20 mg total) by mouth daily.   diazepam 10 MG tablet Commonly known as: VALIUM Take 1 tablet (10 mg total) by mouth every 12 (twelve) hours as needed for anxiety or sleep.   docusate sodium 100 MG capsule Commonly known as: COLACE Take 100 mg by mouth daily as needed (constipation).   fexofenadine 180 MG tablet Commonly known as: ALLEGRA Take 180 mg by mouth daily.   metoCLOPramide 10 MG tablet Commonly known as: Reglan Take 1 tablet (10 mg total) by mouth as directed for 1 dose. Take reglan 10 mg 30 minutes prior to taking evening and morning prep           Objective:   Physical Exam BP (!) 170/58 (BP Location: Left Arm, Patient Position: Sitting, Cuff Size: Small)   Pulse (!) 53   Temp 97.7 F (36.5 C) (Temporal)   Resp 16   Ht 5\' 9"  (1.753 m)   Wt 180 lb 8 oz (81.9 kg)   SpO2 99%   BMI 26.66 kg/m  General:   Well developed, NAD, BMI noted. HEENT:  Normocephalic . Face symmetric, atraumatic Lungs:  CTA B Normal respiratory effort, no intercostal retractions, no accessory muscle use. Heart: RRR,  no murmur.  No pretibial edema bilaterally  Skin: Not pale. Not jaundice Neurologic:  alert & oriented X3.  Speech normal, gait appropriate for age and unassisted Psych--  Cognition and judgment appear intact.  Cooperative with normal attention span and concentration.  Behavior appropriate. No anxious or depressed  appearing.      Assessment    Assessment Prediabetes HTN Hyperlipidemia GERD, h/o Candida esophagitis 02-2014, nexium prn Insomnia, on diazepam MSK --  occ. 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  BP today slightly elevated, typically is normal, last week nurse checked his BP: 136/71.  No change, continue monitoring, check a CMP Slightly elevated TSH in the past: Recheck today High cholesterol: Controlled on Lipitor Renal artery stenosis, saw cardiology 11/2018, Doppler was again noted to show severe bilateral renal stenosis.  No indication for intervention at the time, follow-up in 1 year Preventive care:  Flu shot today Had a colonoscopy 03/05/2019, no recall due to age. RTC 6 months CPX

## 2019-03-23 NOTE — Patient Instructions (Addendum)
Please schedule Medicare Wellness with Glenard Haring.   GO TO THE LAB : Get the blood work     GO TO THE FRONT DESK Schedule your next appointment   For a physical exam in 6 months   Check the  blood pressure 2  times a week  BP GOAL is between 110/65 and  135/85. If it is consistently higher or lower, let me know

## 2019-03-23 NOTE — Progress Notes (Signed)
Pre visit review using our clinic review tool, if applicable. No additional management support is needed unless otherwise documented below in the visit note. 

## 2019-03-24 NOTE — Assessment & Plan Note (Signed)
HTN: On amlodipine, Tenormin, Lotensin.  BP today slightly elevated, typically is normal, last week nurse checked his BP: 136/71.  No change, continue monitoring, check a CMP Slightly elevated TSH in the past: Recheck today High cholesterol: Controlled on Lipitor Renal artery stenosis, saw cardiology 11/2018, Doppler was again noted to show severe bilateral renal stenosis.  No indication for intervention at the time, follow-up in 1 year Preventive care:  Flu shot today Had a colonoscopy 03/05/2019, no recall due to age. RTC 6 months CPX

## 2019-04-14 ENCOUNTER — Telehealth: Payer: Self-pay | Admitting: Internal Medicine

## 2019-04-14 NOTE — Telephone Encounter (Signed)
Sent!

## 2019-04-14 NOTE — Telephone Encounter (Signed)
Diazepam refill.   Last OV: 03/23/2019 Last Fill: 11/16/2018 #30 and 2RF Pt sig: 1 tab q12h  prn UDS: 09/17/2018 Low risk

## 2019-07-26 ENCOUNTER — Other Ambulatory Visit: Payer: Self-pay | Admitting: Internal Medicine

## 2019-07-26 NOTE — Telephone Encounter (Signed)
Last written: 04/14/19 Last ov: 03/23/19 Next ov: 09/21/19 Contract: 09/17/18 UDS: 09/17/18

## 2019-07-29 ENCOUNTER — Other Ambulatory Visit: Payer: Self-pay | Admitting: Internal Medicine

## 2019-08-03 ENCOUNTER — Ambulatory Visit: Payer: Medicare Other

## 2019-08-03 ENCOUNTER — Other Ambulatory Visit: Payer: Self-pay

## 2019-08-06 ENCOUNTER — Other Ambulatory Visit: Payer: Self-pay | Admitting: Internal Medicine

## 2019-08-24 ENCOUNTER — Telehealth: Payer: Self-pay | Admitting: Internal Medicine

## 2019-08-24 NOTE — Telephone Encounter (Signed)
Patient would advise on taking the Covid vaccine. Please have Dr Larose Kells to advise per patient.

## 2019-08-25 ENCOUNTER — Other Ambulatory Visit: Payer: Self-pay | Admitting: Internal Medicine

## 2019-08-25 NOTE — Telephone Encounter (Signed)
Spoke w/ Pt- informed that I see no contraindications for him not to receive COVID vaccine. Pt verbalized understanding.

## 2019-08-26 ENCOUNTER — Other Ambulatory Visit: Payer: Self-pay | Admitting: Internal Medicine

## 2019-09-21 ENCOUNTER — Other Ambulatory Visit: Payer: Self-pay

## 2019-09-21 ENCOUNTER — Encounter: Payer: Self-pay | Admitting: Internal Medicine

## 2019-09-21 ENCOUNTER — Ambulatory Visit (INDEPENDENT_AMBULATORY_CARE_PROVIDER_SITE_OTHER): Payer: Medicare Other | Admitting: Internal Medicine

## 2019-09-21 VITALS — BP 178/63 | HR 56 | Temp 97.3°F | Resp 18 | Ht 69.0 in | Wt 185.5 lb

## 2019-09-21 DIAGNOSIS — Z0000 Encounter for general adult medical examination without abnormal findings: Secondary | ICD-10-CM | POA: Diagnosis not present

## 2019-09-21 DIAGNOSIS — Z79899 Other long term (current) drug therapy: Secondary | ICD-10-CM | POA: Diagnosis not present

## 2019-09-21 DIAGNOSIS — G4700 Insomnia, unspecified: Secondary | ICD-10-CM | POA: Diagnosis not present

## 2019-09-21 LAB — BASIC METABOLIC PANEL
BUN: 21 mg/dL (ref 6–23)
CO2: 28 mEq/L (ref 19–32)
Calcium: 9.4 mg/dL (ref 8.4–10.5)
Chloride: 105 mEq/L (ref 96–112)
Creatinine, Ser: 1.26 mg/dL (ref 0.40–1.50)
GFR: 55.22 mL/min — ABNORMAL LOW (ref >60.00)
Glucose, Bld: 95 mg/dL (ref 70–99)
Potassium: 4.7 mEq/L (ref 3.5–5.1)
Sodium: 140 mEq/L (ref 135–145)

## 2019-09-21 LAB — CBC WITH DIFFERENTIAL/PLATELET
Basophils Absolute: 0.1 10*3/uL (ref 0.0–0.1)
Basophils Relative: 0.6 % (ref 0.0–3.0)
Eosinophils Absolute: 0.4 10*3/uL (ref 0.0–0.7)
Eosinophils Relative: 3.3 % (ref 0.0–5.0)
HCT: 42.9 % (ref 39.0–52.0)
Hemoglobin: 14.6 g/dL (ref 13.0–17.0)
Lymphocytes Relative: 20.4 % (ref 12.0–46.0)
Lymphs Abs: 2.3 10*3/uL (ref 0.7–4.0)
MCHC: 34 g/dL (ref 30.0–36.0)
MCV: 87 fl (ref 78.0–100.0)
Monocytes Absolute: 0.8 10*3/uL (ref 0.1–1.0)
Monocytes Relative: 7.4 % (ref 3.0–12.0)
Neutro Abs: 7.7 10*3/uL (ref 1.4–7.7)
Neutrophils Relative %: 68.3 % (ref 43.0–77.0)
Platelets: 160 10*3/uL (ref 150.0–400.0)
RBC: 4.93 Mil/uL (ref 4.22–5.81)
RDW: 13.6 % (ref 11.5–15.5)
WBC: 11.2 10*3/uL — ABNORMAL HIGH (ref 4.0–10.5)

## 2019-09-21 LAB — LIPID PANEL
Cholesterol: 115 mg/dL (ref 0–200)
HDL: 39.4 mg/dL (ref >39.00)
LDL Cholesterol: 55 mg/dL (ref 0–99)
NonHDL: 75.42
Total CHOL/HDL Ratio: 3
Triglycerides: 102 mg/dL (ref 0.0–149.0)
VLDL: 20.4 mg/dL (ref 0.0–40.0)
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
## 2019-09-21 NOTE — Patient Instructions (Addendum)
Please schedule Medicare Wellness with Glenard Haring.   GO TO THE LAB : Get the blood work     GO TO [place] back for a checkup in 4 to 5 months, please make an appointment  Continue checking your blood pressures BP GOAL is between 110/65 and  135/85. If it is consistently higher or lower, let me know

## 2019-09-21 NOTE — Progress Notes (Signed)
Pre visit review using our clinic review tool, if applicable. No additional management support is needed unless otherwise documented below in the visit note. 

## 2019-09-22 NOTE — Assessment & Plan Note (Signed)
Here for CPX Prediabetes: Last A1c satisfactory HTN: At home is consistently in the 120s, BP is elevated today, patient thinks related to the neck pain. Continue amlodipine,Atenolol, Lotensin. Checking labs. If BP is not improving in the next few days he will let me know. Hyperlipidemia:On Lipitor, checking labs. Neck pain: Current episode started 3 days ago, similar and no worse than previous episodes. Typically Tylenol and chiropractor visit help the issue. That is what he plans to do. He is to let me know if the neck pain is not improving soon. On Valium as needed for neck pain, check UDS CAD: Asymptomatic, active at the [facility] without symptoms. RTC 4 to 5 months

## 2019-09-22 NOTE — Assessment & Plan Note (Signed)
-  Td 2015 - PNM shot 2008, 09-2018; prevnar 2015 - had a flu shot  - shingrex rx  printed before, hold for now - covid vaccine: to get #2 tomorrow  -CCS: cscope 02-2015,  colonoscopy 03/05/2019, no recall due to age. Prostate cancer screening:   elected not to screen, see last OV. -Labs:  BMP, FLP, CBC, UDS -Admits his diet needs needs to improve, mostly needs to practice portion control. He is very active, go to the [facility] regularly.

## 2019-09-23 LAB — PAIN MGMT, PROFILE 8 W/CONF, U
6 Acetylmorphine: NEGATIVE ng/mL
Alcohol Metabolites: NEGATIVE ng/mL (ref <500)
Alphahydroxyalprazolam: NEGATIVE ng/mL
Alphahydroxymidazolam: NEGATIVE ng/mL
Alphahydroxytriazolam: NEGATIVE ng/mL
Aminoclonazepam: NEGATIVE ng/mL
Amphetamines: NEGATIVE ng/mL
Benzodiazepines: POSITIVE ng/mL
Buprenorphine, Urine: NEGATIVE ng/mL
Cocaine Metabolite: NEGATIVE ng/mL
Creatinine: 64 mg/dL
Hydroxyethylflurazepam: NEGATIVE ng/mL
Lorazepam: NEGATIVE ng/mL
MDMA: NEGATIVE ng/mL
Marijuana Metabolite: NEGATIVE ng/mL
Nordiazepam: 179 ng/mL
Opiates: NEGATIVE ng/mL
Oxazepam: 417 ng/mL
Oxidant: NEGATIVE ug/mL
Oxycodone: NEGATIVE ng/mL
Temazepam: 285 ng/mL
pH: 6.4 (ref 4.5–9.0)

## 2019-11-03 ENCOUNTER — Other Ambulatory Visit: Payer: Self-pay | Admitting: Internal Medicine

## 2019-11-22 ENCOUNTER — Telehealth: Payer: Self-pay | Admitting: Internal Medicine

## 2019-11-22 ENCOUNTER — Other Ambulatory Visit: Payer: Self-pay

## 2019-11-22 ENCOUNTER — Ambulatory Visit (INDEPENDENT_AMBULATORY_CARE_PROVIDER_SITE_OTHER): Payer: Medicare Other | Admitting: Internal Medicine

## 2019-11-22 ENCOUNTER — Encounter: Payer: Self-pay | Admitting: Internal Medicine

## 2019-11-22 ENCOUNTER — Ambulatory Visit (HOSPITAL_BASED_OUTPATIENT_CLINIC_OR_DEPARTMENT_OTHER)
Admission: RE | Admit: 2019-11-22 | Discharge: 2019-11-22 | Disposition: A | Discharge status: Home / Self Care | Payer: Medicare Other | Source: Ambulatory Visit | Attending: Internal Medicine | Admitting: Internal Medicine

## 2019-11-22 VITALS — BP 171/64 | HR 54 | Temp 97.9°F | Resp 18 | Ht 69.0 in | Wt 185.1 lb

## 2019-11-22 DIAGNOSIS — R079 Chest pain, unspecified: Secondary | ICD-10-CM | POA: Diagnosis not present

## 2019-11-22 DIAGNOSIS — I10 Essential (primary) hypertension: Secondary | ICD-10-CM | POA: Diagnosis not present

## 2019-11-22 NOTE — Progress Notes (Signed)
Pre visit review using our clinic review tool, if applicable. No additional management support is needed unless otherwise documented below in the visit note. 

## 2019-11-22 NOTE — Progress Notes (Signed)
Subjective:    Patient ID: Dustin Ashley, male    DOB: [birth year], 79 y.o.   MRN: [id number]  DOS:  11/22/2019 Type of visit - description: Acute Chief complaint is chest pain. He had this sx  before sporadically however he is now experiencing chest pain almost every day for the last week. The pain is located on the left upper and  lateral chest, typically associated with certain movements like lying down or transferring from the chair. No associated with nausea, sweats. Occasionally has pain at the left upper back. The pain is also noted at bedtime when he turns in bed. Last few seconds.Marland Kitchen  He is able to go to the [facility] workout as well is doing yard work without any symptoms. Moving his arms, coughing/deep breaths does not increase pain.  BP Readings from Last 3 Encounters:  11/22/19 (!) 171/64  09/21/19 (!) 178/63  03/23/19 (!) 170/58    Review of Systems No fever chills No difficulty breathing No edema or palpitations No rash Abdominal pain Past Medical History:  Diagnosis Date  . Allergic rhinitis   . Allergy    seasonal  . Arthritis   . 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 CAD (coronary artery disease)   . Cataract   . Chronic renal insufficiency, stage II (mild)    Cr ~ 1.4, u/s 4-12 normal kidneys  . Gallstone pancreatitis 2015  . GERD (gastroesophageal reflux disease)   . HOH (hard of hearing)    has a hearing aid L, sees audiology rountinely  . Hyperglycemia    A1C 5.8 04-2010  . Hyperlipemia   . Hypertension   . Pain, joint, multiple sites    uses valium, occ uses for insomnia. uses for shoulder  pain  . Paroxysmal atrial fibrillation (HCC)   . Personal history of colonic polyps   . Pulmonary emboli ([place])    02-2014  . RAS (renal artery stenosis) ([place]) 05-2012   . Renal insufficiency     Past Surgical History:  Procedure Laterality Date  . CARDIAC CATHETERIZATION    . CATARACT EXTRACTION    . CHOLECYSTECTOMY N/A 02/15/2014   Procedure: LAPAROSCOPIC CHOLECYSTECTOMY  with IOC;  Surgeon: Gayland Curry, MD;  Location: [place];  Service: General;  Laterality: N/A;  . COLONOSCOPY    . CORONARY ARTERY BYPASS GRAFT  1995  . ESOPHAGOGASTRODUODENOSCOPY N/A 03/02/2014   Procedure: ESOPHAGOGASTRODUODENOSCOPY (EGD);  Surgeon: Ladene Artist, MD;  Location: [facility] ENDOSCOPY;  Service: Endoscopy;  Laterality: N/A;  sarah/leone    Allergies as of 11/22/2019      Reactions   Hydrocodone-acetaminophen Other (See Comments)   REACTION: bladder obstruction   Tramadol Hcl Other (See Comments)   REACTION: bladder obstruction      Medication List       Accurate as of Nov 22, 2019 11:06 AM. If you have any questions, ask your nurse or doctor.        acetaminophen 325 MG tablet Commonly known as: TYLENOL Take 2 tablets (650 mg total) by mouth every 6 (six) hours as needed for mild pain, moderate pain, fever or headache.   amLODipine 10 MG tablet Commonly known as: NORVASC Take 1 tablet (10 mg total) by mouth daily.   aspirin 81 MG tablet Take 81 mg by mouth daily.   atenolol 50 MG tablet Commonly known as: TENORMIN Take 1.5 tablets (75 mg total) by mouth daily.   atorvastatin 20 MG tablet Commonly known as: LIPITOR Take 1 tablet (20 mg total)  by mouth daily.   benazepril 20 MG tablet Commonly known as: LOTENSIN Take 1 tablet (20 mg total) by mouth daily.   diazepam 10 MG tablet Commonly known as: VALIUM TAKE 1 TABLET (10 MG TOTAL) BY MOUTH EVERY 12 (TWELVE) HOURS AS NEEDED FOR ANXIETY OR SLEEP.   docusate sodium 100 MG capsule Commonly known as: COLACE Take 100 mg by mouth daily as needed (constipation).   fexofenadine 180 MG tablet Commonly known as: ALLEGRA Take 180 mg by mouth daily.   metoCLOPramide 10 MG tablet Commonly known as: Reglan Take 1 tablet (10 mg total) by mouth as directed for 1 dose. Take reglan 10 mg 30 minutes prior to taking evening and morning prep          Objective:   Physical Exam BP (!) 171/64 (BP Location: Left Arm,  Patient Position: Sitting, Cuff Size: Small)   Pulse (!) 54   Temp 97.9 F (36.6 C) (Temporal)   Resp 18   Ht 5\' 9"  (1.753 m)   Wt 185 lb 2 oz (84 kg)   SpO2 98%   BMI 27.34 kg/m  General:   Well developed, NAD, BMI noted. HEENT:  Normocephalic . Face symmetric, atraumatic Lungs:  CTA B Normal respiratory effort, no intercostal retractions, no accessory muscle use. Heart: RRR,  no murmur.  Lower extremities: no pretibial edema bilaterally  Skin: Rash at the back or chest. MSK: Chest wall no TTP, thoracic spine no TTP Neurologic:  alert & oriented X3.  Speech normal, gait appropriate for age and unassisted Psych--  Cognition and judgment appear intact.  Cooperative with normal attention span and concentration.  Behavior appropriate. No anxious or depressed appearing.      Assessment      Assessment Prediabetes HTN Hyperlipidemia GERD, h/o Candida esophagitis 02-2014, nexium prn Insomnia, on diazepam MSK --  occ. 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 EKG today: Sinus bradycardia unchanged from previous. Pain is atypical, likely MSK. For completeness we will get a chest x-ray otherwise observation, Tylenol as needed.  Call if not gradually resolving, call if severe symptoms. HTN: BP today is again elevated, at home is typically okay in the 120s, only 1 time was in the 170s.  No change, continue amlodipine, Tenormin, Lotensin, and monitoring.     This visit occurred during the SARS-CoV-2 public health emergency.  Safety protocols were in place, including screening questions prior to the  visit, additional usage of staff PPE, and extensive cleaning of exam room while observing appropriate contact time as indicated for disinfecting solutions.

## 2019-11-22 NOTE — Telephone Encounter (Signed)
Caller: Danyel Gallman Call Back # [phone]   Patient states that received Covid Vaccine   (prizfer)   First Dose : 08/28/2019  Second Dose : 09/22/2019

## 2019-11-22 NOTE — Telephone Encounter (Signed)
Immunization record updated.

## 2019-11-22 NOTE — Patient Instructions (Addendum)
Please schedule Medicare Wellness with Glenard Haring.   Please call us with your COVID vaccination dates.   Tylenol  500 mg OTC 2 tabs a day every 8 hours as needed for pain    STOP BY THE FIRST FLOOR:  get the XR   Continue checking your blood pressures 2-3 times a week BP GOAL is between 110/65 and  135/85. If it is consistently higher or lower, let me know

## 2019-11-22 NOTE — Telephone Encounter (Signed)
Blood Pressure   Reading   135/70

## 2019-11-23 ENCOUNTER — Telehealth: Payer: Self-pay | Admitting: Internal Medicine

## 2019-11-23 NOTE — Telephone Encounter (Signed)
PDMP okay, Rx sent 

## 2019-11-23 NOTE — Telephone Encounter (Signed)
Diazepam refill.   Last OV: 11/22/2019 Last Fill: 07/27/2019 #30 and 3RF Pt sig: 1 tab q12h prn UDS: 09/21/19 Low risk

## 2019-11-23 NOTE — Assessment & Plan Note (Signed)
Chest pain: As described above,  the patient has prediabetes, HTN, hyperlipidemia and CAD. EKG today: Sinus bradycardia unchanged from previous. Pain is atypical, likely MSK. For completeness we will get a chest x-ray otherwise observation, Tylenol as needed.  Call if not gradually resolving, call if severe symptoms. HTN: BP today is again elevated, at home is typically okay in the 120s, only 1 time was in the 170s.  No change, continue amlodipine, Tenormin, Lotensin, and monitoring.

## 2019-11-29 ENCOUNTER — Other Ambulatory Visit (HOSPITAL_COMMUNITY): Payer: Self-pay | Admitting: Cardiovascular Disease

## 2019-11-29 ENCOUNTER — Ambulatory Visit (HOSPITAL_COMMUNITY)
Admission: RE | Admit: 2019-11-29 | Discharge: 2019-11-29 | Disposition: A | Discharge status: Home / Self Care | Payer: Medicare Other | Source: Ambulatory Visit | Attending: Cardiovascular Disease | Admitting: Cardiovascular Disease

## 2019-11-29 ENCOUNTER — Other Ambulatory Visit: Payer: Self-pay

## 2019-11-29 DIAGNOSIS — Z8679 Personal history of other diseases of the circulatory system: Secondary | ICD-10-CM

## 2019-11-29 DIAGNOSIS — I701 Atherosclerosis of renal artery: Secondary | ICD-10-CM | POA: Insufficient documentation

## 2019-11-30 ENCOUNTER — Telehealth: Payer: Self-pay | Admitting: Cardiovascular Disease

## 2019-11-30 NOTE — Telephone Encounter (Signed)
Dustin Ashley is returning Dustin Ashley's call in regards to results. Please advise.

## 2019-11-30 NOTE — Telephone Encounter (Signed)
Returned call to pt and he has been made aware of his Renal US results and he verbalized understanding.

## 2020-01-21 ENCOUNTER — Ambulatory Visit: Payer: Medicare Other | Admitting: Internal Medicine

## 2020-01-27 ENCOUNTER — Ambulatory Visit (INDEPENDENT_AMBULATORY_CARE_PROVIDER_SITE_OTHER): Payer: Medicare Other | Admitting: Internal Medicine

## 2020-01-27 ENCOUNTER — Other Ambulatory Visit: Payer: Self-pay

## 2020-01-27 VITALS — BP 158/74 | HR 53 | Temp 98.4°F | Resp 16 | Wt 184.0 lb

## 2020-01-27 DIAGNOSIS — I10 Essential (primary) hypertension: Secondary | ICD-10-CM

## 2020-01-27 DIAGNOSIS — R079 Chest pain, unspecified: Secondary | ICD-10-CM | POA: Diagnosis not present

## 2020-01-27 NOTE — Patient Instructions (Addendum)
Check the  blood pressure 2 or 3 times a   week   BP GOAL is between 110/65 and  135/85.  Write your blood pressure readings and bring a log in [redacted] weeks along with your blood pressure machine     GO TO THE FRONT DESK, PLEASE SCHEDULE YOUR APPOINTMENTS Come back for a NURSE VISIT in 3 weeks    Come back for office visit with me in 3 to 4 months

## 2020-01-27 NOTE — Progress Notes (Signed)
Subjective:    Patient ID: Dustin Ashley, male    DOB: January 19, 1941, 79 y.o.   MRN: [id number]  DOS:  01/27/2020 Type of visit - description: Follow-up from previous visit Had chest pain: That quickly resolved HTN: BP today's are elevated, at home they are normal.   BP Readings from Last 3 Encounters:  01/27/20 (!) 158/74  11/22/19 (!) 171/64  09/21/19 (!) 178/63     Review of Systems Denies nausea, vomiting, diarrhea No headache or dizziness  Past Medical History:  Diagnosis Date  . Allergic rhinitis   . Allergy    seasonal  . Arthritis   . 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 CAD (coronary artery disease)   . Cataract   . Chronic renal insufficiency, stage II (mild)    Cr ~ 1.4, u/s 4-12 normal kidneys  . Gallstone pancreatitis 2015  . GERD (gastroesophageal reflux disease)   . HOH (hard of hearing)    has a hearing aid L, sees audiology rountinely  . Hyperglycemia    A1C 5.8 04-2010  . Hyperlipemia   . Hypertension   . Pain, joint, multiple sites    uses valium, occ uses for insomnia. uses for shoulder  pain  . Paroxysmal atrial fibrillation (HCC)   . Personal history of colonic polyps   . Pulmonary emboli ([place])    02-2014  . RAS (renal artery stenosis) ([place]) 05-2012   . Renal insufficiency     Past Surgical History:  Procedure Laterality Date  . CARDIAC CATHETERIZATION    . CATARACT EXTRACTION    . CHOLECYSTECTOMY N/A 02/15/2014   Procedure: LAPAROSCOPIC CHOLECYSTECTOMY with IOC;  Surgeon: Gayland Curry, MD;  Location: [facility];  Service: General;  Laterality: N/A;  . COLONOSCOPY    . CORONARY ARTERY BYPASS GRAFT  1995  . ESOPHAGOGASTRODUODENOSCOPY N/A 03/02/2014   Procedure: ESOPHAGOGASTRODUODENOSCOPY (EGD);  Surgeon: Ladene Artist, MD;  Location: [facility] ENDOSCOPY;  Service: Endoscopy;  Laterality: N/A;  sarah/leone    Allergies as of 01/27/2020      Reactions   Hydrocodone-acetaminophen Other (See Comments)   REACTION: bladder obstruction   Tramadol Hcl Other (See Comments)     REACTION: bladder obstruction      Medication List       Accurate as of January 27, 2020 11:59 PM. If you have any questions, ask your nurse or doctor.        acetaminophen 325 MG tablet Commonly known as: TYLENOL Take 2 tablets (650 mg total) by mouth every 6 (six) hours as needed for mild pain, moderate pain, fever or headache.   amLODipine 10 MG tablet Commonly known as: NORVASC Take 1 tablet (10 mg total) by mouth daily.   aspirin 81 MG tablet Take 81 mg by mouth daily.   atenolol 50 MG tablet Commonly known as: TENORMIN Take 1.5 tablets (75 mg total) by mouth daily.   atorvastatin 20 MG tablet Commonly known as: LIPITOR Take 1 tablet (20 mg total) by mouth daily.   benazepril 20 MG tablet Commonly known as: LOTENSIN Take 1 tablet (20 mg total) by mouth daily.   diazepam 10 MG tablet Commonly known as: VALIUM TAKE 1 TABLET (10 MG TOTAL) BY MOUTH EVERY 12 (TWELVE) HOURS AS NEEDED FOR ANXIETY OR SLEEP.   docusate sodium 100 MG capsule Commonly known as: COLACE Take 100 mg by mouth daily as needed (constipation).   fexofenadine 180 MG tablet Commonly known as: ALLEGRA Take 180 mg by mouth daily.   metoCLOPramide 10  MG tablet Commonly known as: Reglan Take 1 tablet (10 mg total) by mouth as directed for 1 dose. Take reglan 10 mg 30 minutes prior to taking evening and morning prep          Objective:   Physical Exam BP (!) 158/74 (BP Location: Left Arm, Patient Position: Sitting, Cuff Size: Small)   Pulse (!) 53   Temp 98.4 F (36.9 C) (Oral)   Resp 16   Wt 184 lb (83.5 kg)   SpO2 96%   BMI 27.17 kg/m  General:   Well developed, NAD, BMI noted. HEENT:  Normocephalic . Face symmetric, atraumatic Lungs:  CTA B Normal respiratory effort, no intercostal retractions, no accessory muscle use. Heart: RRR,  no murmur.  Lower extremities: no pretibial edema bilaterally  Skin: Not pale. Not jaundice Neurologic:  alert & oriented X3.  Speech normal,  gait appropriate for age and unassisted Psych--  Cognition and judgment appear intact.  Cooperative with normal attention span and concentration.  Behavior appropriate. No anxious or depressed appearing.      Assessment    Assessment Prediabetes HTN Hyperlipidemia GERD, h/o Candida esophagitis 02-2014, nexium prn Insomnia, on diazepam MSK --  occ. 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 HTN: BP slightly elevated upon arrival, I rechecked manually: 165/85.  At home BPs consistently between 119 and 133. Whitecoat syndrome? Plan: Continue present care, monitor BPs at home, nurse visit in 3 weeks, patient to bring the log as well as his blood pressure cuff to check it against ours.  Consider adjust medications. RTC 3 weeks nurse visit RTC office visit 3 to 4 months  This visit occurred during the SARS-CoV-2 public health emergency.  Safety protocols were in place, including screening questions prior to the visit, additional usage of staff PPE, and extensive cleaning of exam room while observing appropriate contact time as indicated for disinfecting solutions.

## 2020-01-29 NOTE — Assessment & Plan Note (Signed)
Chest pain: See last visit, symptoms quickly resolved. HTN: BP slightly elevated upon arrival, I rechecked manually: 165/85.  At home BPs consistently between 119 and 133. Whitecoat syndrome? Plan: Continue present care, monitor BPs at home, nurse visit in 3 weeks, patient to bring the log as well as his blood pressure cuff to check it against ours.  Consider adjust medications. RTC 3 weeks nurse visit RTC office visit 3 to 4 months

## 2020-02-17 ENCOUNTER — Other Ambulatory Visit: Payer: Self-pay

## 2020-02-17 ENCOUNTER — Ambulatory Visit (INDEPENDENT_AMBULATORY_CARE_PROVIDER_SITE_OTHER): Payer: Medicare Other | Admitting: Internal Medicine

## 2020-02-17 VITALS — BP 172/62 | HR 56

## 2020-02-17 DIAGNOSIS — I10 Essential (primary) hypertension: Secondary | ICD-10-CM | POA: Diagnosis not present

## 2020-02-17 NOTE — Progress Notes (Signed)
Pt here for Blood pressure check per Dr. Larose Kells  Pt currently takes: atenolol, atorvastatin, and amlodipine   Pt reports compliance with medication.  Checks blood pressure mostly in the mornings.    Patient brought in his own machine to day and his reading was: 167/65 56 right arm              177/84 54 left arm   BP today @ =  Right arm 150/60  HR = 56  Left arm 172/62   HR 53  Pt advised per Dr. Larose Kells to continue same medications, no changes.  Follow up around 06/03/20   [name], MD

## 2020-02-17 NOTE — Progress Notes (Signed)
Here for BP check, the majority of his readings at home (9 out of 12) are normal.  The other readings are slightly elevated. No change Follow-up around 06/03/2020

## 2020-02-28 ENCOUNTER — Telehealth: Payer: Self-pay | Admitting: Internal Medicine

## 2020-02-28 NOTE — Telephone Encounter (Signed)
Diazepam refill.   Last OV: 02/17/2020 Last Fill: 11/23/2019 #30 and 2RF Pt sig: 1 tab q12h prn UDS: 09/21/2019 Low risk

## 2020-02-28 NOTE — Telephone Encounter (Signed)
PDMP okay, prescription sent 

## 2020-03-01 DIAGNOSIS — D485 Neoplasm of uncertain behavior of skin: Secondary | ICD-10-CM | POA: Diagnosis not present

## 2020-03-01 DIAGNOSIS — C44622 Squamous cell carcinoma of skin of right upper limb, including shoulder: Secondary | ICD-10-CM | POA: Diagnosis not present

## 2020-03-15 DIAGNOSIS — C4492 Squamous cell carcinoma of skin, unspecified: Secondary | ICD-10-CM | POA: Insufficient documentation

## 2020-04-12 DIAGNOSIS — Z85828 Personal history of other malignant neoplasm of skin: Secondary | ICD-10-CM | POA: Diagnosis not present

## 2020-04-12 DIAGNOSIS — L570 Actinic keratosis: Secondary | ICD-10-CM | POA: Diagnosis not present

## 2020-04-12 DIAGNOSIS — L905 Scar conditions and fibrosis of skin: Secondary | ICD-10-CM | POA: Diagnosis not present

## 2020-04-12 DIAGNOSIS — L578 Other skin changes due to chronic exposure to nonionizing radiation: Secondary | ICD-10-CM | POA: Diagnosis not present

## 2020-04-19 ENCOUNTER — Telehealth: Payer: Self-pay | Admitting: Internal Medicine

## 2020-04-19 NOTE — Progress Notes (Signed)
  Chronic Care Management   Outreach Note  04/19/2020 Name: Dustin Ashley MRN: [id number] DOB: 28-Feb-1941  Referred by: Colon Branch, MD Reason for referral : No chief complaint on file.   An unsuccessful telephone outreach was attempted today. The patient was referred to the pharmacist for assistance with care management and care coordination.   Follow Up Plan:   Carley Perdue [employer] Scheduler

## 2020-04-26 ENCOUNTER — Telehealth: Payer: Self-pay | Admitting: Internal Medicine

## 2020-04-26 NOTE — Progress Notes (Signed)
  Chronic Care Management   Note  04/26/2020 Name: JAISE MOSER MRN: [id number] DOB: 05-May-1941  DARCEL FRANE is a 79 y.o. year old male who is a primary care patient of Colon Branch, MD. I reached out to Margaretha Seeds by phone today in response to a referral sent by Mr. Clayson Riling Stan's PCP, Colon Branch, MD.   Mr. Folks was given information about Chronic Care Management services today including:  1. CCM service includes personalized support from designated clinical staff supervised by his physician, including individualized plan of care and coordination with other care providers 2. 24/7 contact phone numbers for assistance for urgent and routine care needs. 3. Service will only be billed when office clinical staff spend 20 minutes or more in a month to coordinate care. 4. Only one practitioner may furnish and bill the service in a calendar month. 5. The patient may stop CCM services at any time (effective at the end of the month) by phone call to the office staff.   Patient wishes to consider information provided and/or speak with a member of the care team before deciding about enrollment in care management services.   Follow up plan:   Carley Perdue [employer] Scheduler

## 2020-04-28 ENCOUNTER — Other Ambulatory Visit: Payer: Self-pay | Admitting: Internal Medicine

## 2020-04-28 NOTE — Telephone Encounter (Signed)
E-scribe down. Rx faxed to [facility]. Received fax confirmation.

## 2020-05-29 ENCOUNTER — Other Ambulatory Visit: Payer: Self-pay

## 2020-05-29 ENCOUNTER — Encounter: Payer: Self-pay | Admitting: Internal Medicine

## 2020-05-29 ENCOUNTER — Ambulatory Visit (INDEPENDENT_AMBULATORY_CARE_PROVIDER_SITE_OTHER): Payer: Medicare Other | Admitting: Internal Medicine

## 2020-05-29 VITALS — BP 198/70 | HR 52 | Temp 97.7°F | Resp 18 | Ht 69.0 in | Wt 187.2 lb

## 2020-05-29 DIAGNOSIS — I10 Essential (primary) hypertension: Secondary | ICD-10-CM

## 2020-05-29 DIAGNOSIS — Z23 Encounter for immunization: Secondary | ICD-10-CM | POA: Diagnosis not present

## 2020-05-29 DIAGNOSIS — R739 Hyperglycemia, unspecified: Secondary | ICD-10-CM | POA: Diagnosis not present

## 2020-05-29 DIAGNOSIS — I701 Atherosclerosis of renal artery: Secondary | ICD-10-CM

## 2020-05-29 DIAGNOSIS — G4700 Insomnia, unspecified: Secondary | ICD-10-CM

## 2020-05-29 LAB — CBC WITH DIFFERENTIAL/PLATELET
Basophils Absolute: 0.1 10*3/uL (ref 0.0–0.1)
Basophils Relative: 1 % (ref 0.0–3.0)
Eosinophils Absolute: 0.4 10*3/uL (ref 0.0–0.7)
Eosinophils Relative: 3.6 % (ref 0.0–5.0)
HCT: 42.6 % (ref 39.0–52.0)
Hemoglobin: 14.6 g/dL (ref 13.0–17.0)
Lymphocytes Relative: 24 % (ref 12.0–46.0)
Lymphs Abs: 2.4 10*3/uL (ref 0.7–4.0)
MCHC: 34.2 g/dL (ref 30.0–36.0)
MCV: 87 fl (ref 78.0–100.0)
Monocytes Absolute: 0.7 10*3/uL (ref 0.1–1.0)
Monocytes Relative: 7 % (ref 3.0–12.0)
Neutro Abs: 6.3 10*3/uL (ref 1.4–7.7)
Neutrophils Relative %: 64.4 % (ref 43.0–77.0)
Platelets: 167 10*3/uL (ref 150.0–400.0)
RBC: 4.89 Mil/uL (ref 4.22–5.81)
RDW: 13.4 % (ref 11.5–15.5)
WBC: 9.8 10*3/uL (ref 4.0–10.5)

## 2020-05-29 LAB — COMPREHENSIVE METABOLIC PANEL
ALT: 15 U/L (ref 0–53)
AST: 18 U/L (ref 0–37)
Albumin: 4.2 g/dL (ref 3.5–5.2)
Alkaline Phosphatase: 95 U/L (ref 39–117)
BUN: 25 mg/dL — ABNORMAL HIGH (ref 6–23)
CO2: 30 mEq/L (ref 19–32)
Calcium: 9.5 mg/dL (ref 8.4–10.5)
Chloride: 106 mEq/L (ref 96–112)
Creatinine, Ser: 1.33 mg/dL (ref 0.40–1.50)
GFR: 50.81 mL/min — ABNORMAL LOW (ref >60.00)
Glucose, Bld: 93 mg/dL (ref 70–99)
Potassium: 4.3 mEq/L (ref 3.5–5.1)
Sodium: 142 mEq/L (ref 135–145)
Total Bilirubin: 0.8 mg/dL (ref 0.2–1.2)
Total Protein: 6.9 g/dL (ref 6.0–8.3)

## 2020-05-29 LAB — HEMOGLOBIN A1C: Hgb A1c MFr Bld: 5.6 % (ref 4.6–6.5)

## 2020-05-29 NOTE — Patient Instructions (Signed)
Check the  blood pressure 2 or 3 times a month week  BP GOAL is between 110/65 and  135/85. Please call in 2 weeks and let me know how your blood pressures are.   Proceed with a Covid booster  GO TO THE LAB : Get the blood work     [place], PLEASE SCHEDULE YOUR APPOINTMENTS Come back for a physical exam in 4 months

## 2020-05-29 NOTE — Progress Notes (Signed)
Subjective:    Patient ID: Dustin Ashley, male    DOB: 11-27-1940, 79 y.o.   MRN: [id number]  DOS:  05/29/2020 Type of visit - description: Follow-up  Since the last office visit he is doing well. BP was elevated upon arrival, he denies chest pain, difficulty breathing, lower extremity edema.  No headache or dizziness. Good med compliance, admits that she has not been able to exercise much in the last 2 weeks   BP Readings from Last 3 Encounters:  05/29/20 (!) 198/70  02/17/20 (!) 172/62  01/27/20 (!) 158/74     Review of Systems See above   Past Medical History:  Diagnosis Date  . Allergic rhinitis   . Allergy    seasonal  . Arthritis   . 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 CAD (coronary artery disease)   . Cataract   . Chronic renal insufficiency, stage II (mild)    Cr ~ 1.4, u/s 4-12 normal kidneys  . Gallstone pancreatitis 2015  . GERD (gastroesophageal reflux disease)   . HOH (hard of hearing)    has a hearing aid L, sees audiology rountinely  . Hyperglycemia    A1C 5.8 04-2010  . Hyperlipemia   . Hypertension   . Pain, joint, multiple sites    uses valium, occ uses for insomnia. uses for shoulder  pain  . Paroxysmal atrial fibrillation (HCC)   . Personal history of colonic polyps   . Pulmonary emboli ([place])    02-2014  . RAS (renal artery stenosis) ([place]) 05-2012   . Renal insufficiency   . SCC (squamous cell carcinoma)     Past Surgical History:  Procedure Laterality Date  . CARDIAC CATHETERIZATION    . CATARACT EXTRACTION    . CHOLECYSTECTOMY N/A 02/15/2014   Procedure: LAPAROSCOPIC CHOLECYSTECTOMY with IOC;  Surgeon: Gayland Curry, MD;  Location: [facility];  Service: General;  Laterality: N/A;  . COLONOSCOPY    . CORONARY ARTERY BYPASS GRAFT  1995  . ESOPHAGOGASTRODUODENOSCOPY N/A 03/02/2014   Procedure: ESOPHAGOGASTRODUODENOSCOPY (EGD);  Surgeon: Ladene Artist, MD;  Location: [facility] ENDOSCOPY;  Service: Endoscopy;  Laterality: N/A;  sarah/leone    Allergies as of  05/29/2020      Reactions   Hydrocodone-acetaminophen Other (See Comments)   REACTION: bladder obstruction   Tramadol Hcl Other (See Comments)   REACTION: bladder obstruction      Medication List       Accurate as of May 29, 2020 11:59 PM. If you have any questions, ask your nurse or doctor.        STOP taking these medications   metoCLOPramide 10 MG tablet Commonly known as: Reglan Stopped by: [name], MD     TAKE these medications   acetaminophen 325 MG tablet Commonly known as: TYLENOL Take 2 tablets (650 mg total) by mouth every 6 (six) hours as needed for mild pain, moderate pain, fever or headache.   amLODipine 10 MG tablet Commonly known as: NORVASC Take 1 tablet (10 mg total) by mouth daily.   aspirin 81 MG tablet Take 81 mg by mouth daily.   atenolol 50 MG tablet Commonly known as: TENORMIN Take 1.5 tablets (75 mg total) by mouth daily.   atorvastatin 20 MG tablet Commonly known as: LIPITOR Take 1 tablet (20 mg total) by mouth daily.   benazepril 20 MG tablet Commonly known as: LOTENSIN Take 1 tablet (20 mg total) by mouth daily.   diazepam 10 MG tablet Commonly known as: VALIUM TAKE  1 TABLET (10 MG TOTAL) BY MOUTH EVERY 12 (TWELVE) HOURS AS NEEDED FOR ANXIETY OR SLEEP.   docusate sodium 100 MG capsule Commonly known as: COLACE Take 100 mg by mouth daily as needed (constipation).   fexofenadine 180 MG tablet Commonly known as: ALLEGRA Take 180 mg by mouth daily.          Objective:   Physical Exam BP (!) 198/70 (BP Location: Left Arm)   Pulse (!) 52   Temp 97.7 F (36.5 C) (Oral)   Resp 18   Ht 5\' 9"  (1.753 m)   Wt 187 lb 4 oz (84.9 kg)   SpO2 98%   BMI 27.65 kg/m  General:   Well developed, NAD, BMI noted.  HEENT:  Normocephalic . Face symmetric, atraumatic Lungs:  CTA B Normal respiratory effort, no intercostal retractions, no accessory muscle use. Heart: RRR,  no murmur.  Abdomen:  Not distended, soft, non-tender.  +  Bruit at midabdomen Skin: Not pale. Not jaundice Lower extremities: no pretibial edema bilaterally  Neurologic:  alert & oriented X3.  Speech normal, gait appropriate for age and unassisted Psych--  Cognition and judgment appear intact.  Cooperative with normal attention span and concentration.  Behavior appropriate. No anxious or depressed appearing.     Assessment     Assessment Prediabetes HTN Hyperlipidemia GERD, h/o Candida esophagitis 02-2014, nexium prn Insomnia, on diazepam MSK --  occ. neck pain, back pain, diazepam prn CKD  Renal US wnl 2012 CV: --CAD, CABG 1995 --Paroxysmal atrial fibrillation; (-) 3 week monitor ~07-2014  , not anticoag   --Pulmonary emboli , DVT, provoked 02-2014 --RAS dx 2013 Low testosterone 11-2014 ED  HOH-- has aids H/o Bell palsy  H/ o gallbladder pancreatitis Sees dermatology  PLAN: Prediabetes: Check A1c HTN: Reports good compliance with Lotensin, Tenormin, amlodipine.  BP at arrival 212/71, recheck 198/70 . Ambulatory BPs: He got several numbers , they are really good between 115/69 and no more than 148/69. Check CMP and CBC. Call me with BP readings in 2 weeks. High cholesterol: Last lipid panel very good, on Lipitor Insomnia: On diazepam, contract signed. 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 Flu shot today RTC 4 month CPX   This visit occurred during the SARS-CoV-2 public health emergency.  Safety protocols were in place, including screening questions prior to the visit, additional usage of staff PPE, and extensive cleaning of exam room while observing appropriate contact time as indicated for disinfecting solutions.

## 2020-05-29 NOTE — Progress Notes (Signed)
Pre visit review using our clinic review tool, if applicable. No additional management support is needed unless otherwise documented below in the visit note. 

## 2020-05-30 NOTE — Assessment & Plan Note (Signed)
Prediabetes: Check A1c HTN: Reports good compliance with Lotensin, Tenormin, amlodipine.  BP at arrival 212/71, recheck 198/70 . Ambulatory BPs: He got several numbers , they are really good between 115/69 and no more than 148/69. Check CMP and CBC. Call me with BP readings in 2 weeks. High cholesterol: Last lipid panel very good, on Lipitor Insomnia: On diazepam, contract signed. 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 Flu shot today RTC 4 month CPX

## 2020-06-12 ENCOUNTER — Telehealth: Payer: Self-pay | Admitting: Internal Medicine

## 2020-06-12 NOTE — Telephone Encounter (Signed)
Patient calling to report B/P begging 05/29/20  143/65 120/66 , 109/55 152/69 145/70 148/68 131/63 146/72 135/69 153/70 146/71 152/73 153/74 126/63 144/70

## 2020-06-12 NOTE — Telephone Encounter (Signed)
Advise patient: Most numbers are very good, continue same medications, continue checking his BPs, call if consistently > 145/85

## 2020-06-12 NOTE — Telephone Encounter (Signed)
Called patient and advised, pt understood and confirmed.

## 2020-06-22 ENCOUNTER — Telehealth: Payer: Self-pay | Admitting: Internal Medicine

## 2020-06-22 NOTE — Telephone Encounter (Signed)
Please call patient, last time he was here BP was elevated, how are his ambulatory BPs?  Any symptoms question

## 2020-06-22 NOTE — Telephone Encounter (Signed)
You are right, I already addressed the issue.

## 2020-06-22 NOTE — Telephone Encounter (Signed)
Please see your message from 06/12/2020, called patient and advised is there anything new that he needs to know?

## 2020-08-16 ENCOUNTER — Other Ambulatory Visit: Payer: Self-pay | Admitting: Internal Medicine

## 2020-08-16 ENCOUNTER — Telehealth: Payer: Self-pay | Admitting: Internal Medicine

## 2020-08-16 NOTE — Telephone Encounter (Signed)
PDMP okay, Rx sent 

## 2020-08-16 NOTE — Telephone Encounter (Signed)
Requesting: diazepam 10mg  Contract: 05/29/2020 UDS: 09/21/2019 Last Visit: 05/29/2020 Next Visit: 09/26/2020 Last Refill: 02/28/2020 #30 and 4RF  Please Advise

## 2020-09-03 IMAGING — DX DG CHEST 2V
2 series · 2 of 2 positions shown · non-contrast
Comparison: Prior chest radiographs 09/07/2018 and earlier

CLINICAL DATA: Chest pain. Additional history provided: Chest pain
off and on for 1 week, former smoker.

EXAM:
CHEST - 2 VIEW

[chest pa]
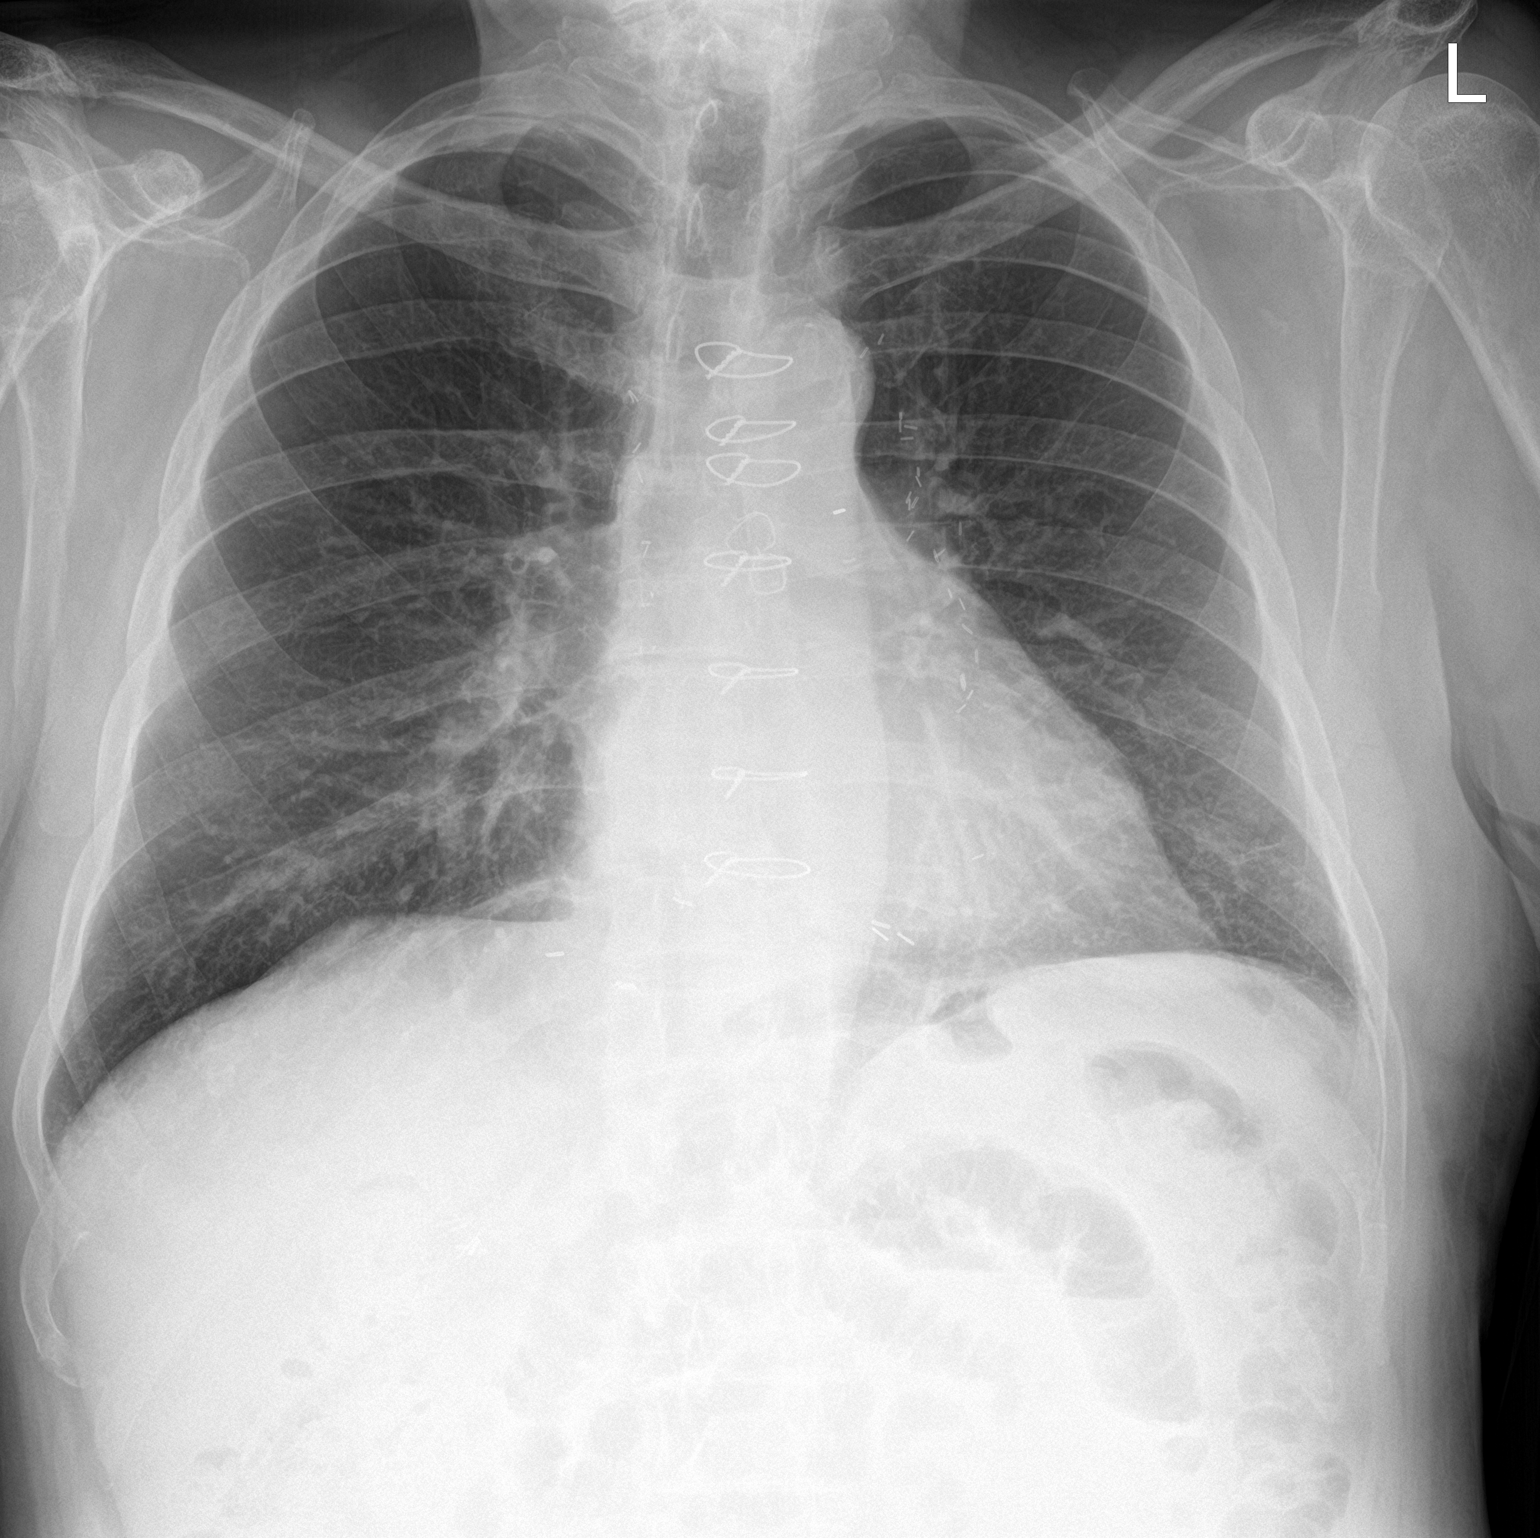

[chest lat]
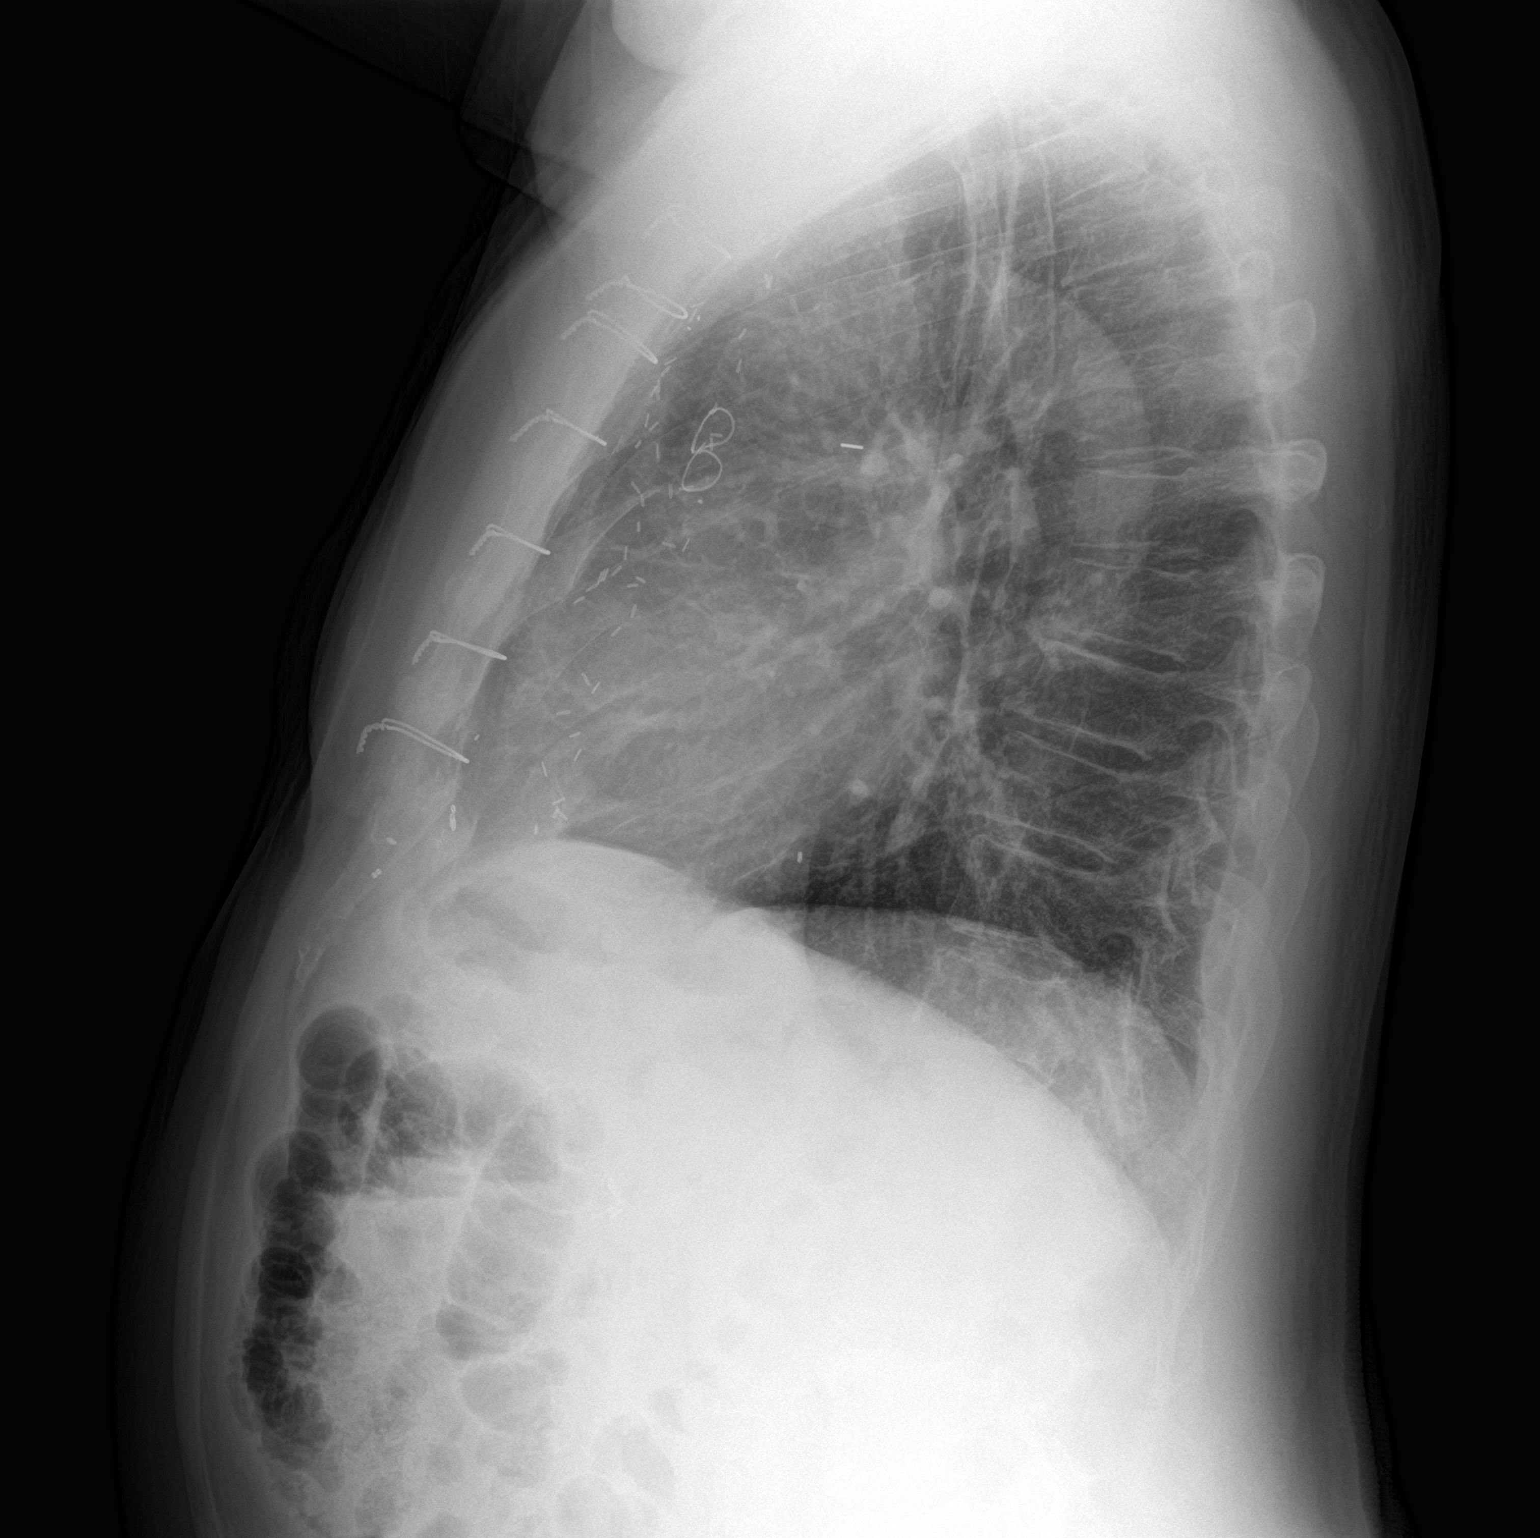

[2 of 2 positions shown; findings below may reference images not displayed]

FINDINGS: Prior median sternotomy. Heart size within normal limits. Aortic
atherosclerosis. No appreciable airspace consolidation within the
lungs. No evidence of pleural effusion or pneumothorax. No acute
bony abnormality.
IMPRESSION: No evidence of acute cardiopulmonary abnormality.

Aortic Atherosclerosis (X4CMS-TO4.4).

## 2020-09-26 ENCOUNTER — Other Ambulatory Visit: Payer: Self-pay

## 2020-09-26 ENCOUNTER — Encounter: Payer: Self-pay | Admitting: Internal Medicine

## 2020-09-26 ENCOUNTER — Ambulatory Visit (INDEPENDENT_AMBULATORY_CARE_PROVIDER_SITE_OTHER): Payer: Medicare Other | Admitting: Internal Medicine

## 2020-09-26 VITALS — BP 162/70 | HR 56 | Temp 97.9°F | Resp 18 | Ht 69.0 in | Wt 190.5 lb

## 2020-09-26 DIAGNOSIS — Z0000 Encounter for general adult medical examination without abnormal findings: Secondary | ICD-10-CM | POA: Diagnosis not present

## 2020-09-26 DIAGNOSIS — Z79899 Other long term (current) drug therapy: Secondary | ICD-10-CM

## 2020-09-26 DIAGNOSIS — G4700 Insomnia, unspecified: Secondary | ICD-10-CM | POA: Diagnosis not present

## 2020-09-26 DIAGNOSIS — I10 Essential (primary) hypertension: Secondary | ICD-10-CM

## 2020-09-26 DIAGNOSIS — Z1159 Encounter for screening for other viral diseases: Secondary | ICD-10-CM | POA: Diagnosis not present

## 2020-09-26 LAB — COMPREHENSIVE METABOLIC PANEL
ALT: 17 U/L (ref 0–53)
AST: 17 U/L (ref 0–37)
Albumin: 3.9 g/dL (ref 3.5–5.2)
Alkaline Phosphatase: 82 U/L (ref 39–117)
BUN: 29 mg/dL — ABNORMAL HIGH (ref 6–23)
CO2: 27 mEq/L (ref 19–32)
Calcium: 9.1 mg/dL (ref 8.4–10.5)
Chloride: 107 mEq/L (ref 96–112)
Creatinine, Ser: 1.5 mg/dL (ref 0.40–1.50)
GFR: 43.88 mL/min — ABNORMAL LOW (ref >60.00)
Glucose, Bld: 96 mg/dL (ref 70–99)
Potassium: 4.2 mEq/L (ref 3.5–5.1)
Sodium: 141 mEq/L (ref 135–145)
Total Bilirubin: 0.6 mg/dL (ref 0.2–1.2)
Total Protein: 6.6 g/dL (ref 6.0–8.3)

## 2020-09-26 LAB — LIPID PANEL
Cholesterol: 120 mg/dL (ref 0–200)
HDL: 37.8 mg/dL — ABNORMAL LOW (ref >39.00)
LDL Cholesterol: 56 mg/dL (ref 0–99)
NonHDL: 82.68
Total CHOL/HDL Ratio: 3
Triglycerides: 131 mg/dL (ref 0.0–149.0)
VLDL: 26.2 mg/dL (ref 0.0–40.0)

## 2020-09-26 NOTE — Patient Instructions (Addendum)
Continue checking your blood pressures BP GOAL is between 110/65 and  145/85. If it is consistently higher or lower, let me know  Think about getting shingles vaccination called [place] (at your local pharmacy)  [facility] : Get the blood work     [place], [place] back for   a checkup in 6 months     Advance Directive  Advance directives are legal documents that allow you to make decisions about your health care and medical treatment in case you become unable to communicate for yourself. Advance directives let your wishes be known to family, friends, and health care providers. Discussing and writing advance directives should happen over time rather than all at once. Advance directives can be changed and updated at any time. There are different types of advance directives, such as:  Medical power of attorney.  Living will.  Do not resuscitate (DNR) order or do not attempt resuscitation (DNAR) order. Health care proxy and medical power of attorney A health care proxy is also called a health care agent. This person is appointed to make medical decisions for you when you are unable to make decisions for yourself. Generally, people ask a trusted friend or family member to act as their proxy and represent their preferences. Make sure you have an agreement with your trusted person to act as your proxy. A proxy may have to make a medical decision on your behalf if your wishes are not known. A medical power of attorney, also called a durable power of attorney for health care, is a legal document that names your health care proxy. Depending on the laws in your state, the document may need to be:  Signed.  Notarized.  Dated.  Copied.  Witnessed.  Incorporated into your medical record. You may also want to appoint a trusted person to manage your money in the event you are unable to do so. This is called a durable power of attorney for  finances. It is a separate legal document from the durable power of attorney for health care. You may choose your health care proxy or someone different to act as your agent in money matters. If you do not appoint a proxy, or there is a concern that the proxy is not acting in your best interest, a court may appoint a guardian to act on your behalf. Living will A living will is a set of instructions that state your wishes about medical care when you cannot express them yourself. Health care providers should keep a copy of your living will in your medical record. You may want to give a copy to family members or friends. To alert caregivers in case of an emergency, you can place a card in your wallet to let them know that you have a living will and where they can find it. A living will is used if you become:  Terminally ill.  Disabled.  Unable to communicate or make decisions. The following decisions should be included in your living will:  To use or not to use life support equipment, such as dialysis machines and breathing machines (ventilators).  Whether you want a DNR or DNAR order. This tells health care providers not to use cardiopulmonary resuscitation (CPR) if breathing or heartbeat stops.  To use or not to use tube feeding.  To be given or not to be given food and fluids.  Whether you want comfort (palliative) care when the goal becomes comfort  rather than a cure.  Whether you want to donate your organs and tissues. A living will does not give instructions for distributing your money and property if you should pass away. DNR or DNAR A DNR or DNAR order is a request not to have CPR in the event that your heart stops beating or you stop breathing. If a DNR or DNAR order has not been made and shared, a health care provider will try to help any patient whose heart has stopped or who has stopped breathing. If you plan to have surgery, talk with your health care provider about how your DNR or  DNAR order will be followed if problems occur. What if I do not have an advance directive? Some states assign family decision makers to act on your behalf if you do not have an advance directive. Each state has its own laws about advance directives. You may want to check with your health care provider, attorney, or state representative about the laws in your state. Summary  Advance directives are legal documents that allow you to make decisions about your health care and medical treatment in case you become unable to communicate for yourself.  The process of discussing and writing advance directives should happen over time. You can change and update advance directives at any time.  Advance directives may include a medical power of attorney, a living will, and a DNR or DNAR order. This information is not intended to replace advice given to you by your health care provider. Make sure you discuss any questions you have with your health care provider. Document Revised: 04/04/2020 Document Reviewed: 04/04/2020 Elsevier Patient Education  2021 [employer].

## 2020-09-26 NOTE — Assessment & Plan Note (Signed)
-  Td 2015 - PNM shot 2008, 09/2018; prevnar 2015 -COVID VAX: X3 - had a flu shot  - shingrex: Discussed, recommended (@ his pharmacy) -CCS, prostate cancer screening: No further, see previous entries -Labs:  CMP, FLP, UDS, hep C -Advance directives: Discussed -Diet exercise: Doing well, goes to the gym 3 times a week.

## 2020-09-26 NOTE — Assessment & Plan Note (Signed)
Here for CPX Prediabetes: Last A1c very good, has a healthy lifestyle HTN: I reviewed carefully his ambulatory BPs, 11 out of 16 readings are within range, less than 145.  The rest are slightly higher but no more than 153. Continue amlodipine, Tenormin, Lotensin, labs. Hyperlipidemia: On Lipitor, labs. Insomnia: On diazepam, check UDS. CAD: On aspirin, controlling CV RF, no symptoms RTC 6 months

## 2020-09-26 NOTE — Progress Notes (Signed)
Subjective:    Patient ID: Dustin Ashley, male    DOB: 1941/06/16, 80 y.o.   MRN: [id number]  DOS:  09/26/2020 Type of visit - description: CPX Since the last office visit is doing well except for back pain. He does have history of back pain on and off, bilateral, no radiation, no paresthesias, no claudication. Typically takes Tylenol with good results. Ambulatory BPs reviewed  Review of Systems  Other than above, a 14 point review of systems is negative      Past Medical History:  Diagnosis Date  . Allergic rhinitis   . Allergy    seasonal  . Arthritis   . 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 CAD (coronary artery disease)   . Cataract   . Chronic renal insufficiency, stage II (mild)    Cr ~ 1.4, u/s 4-12 normal kidneys  . Gallstone pancreatitis 2015  . GERD (gastroesophageal reflux disease)   . HOH (hard of hearing)    has a hearing aid L, sees audiology rountinely  . Hyperglycemia    A1C 5.8 04-2010  . Hyperlipemia   . Hypertension   . Pain, joint, multiple sites    uses valium, occ uses for insomnia. uses for shoulder  pain  . Paroxysmal atrial fibrillation (HCC)   . Personal history of colonic polyps   . Pulmonary emboli ([place])    02-2014  . RAS (renal artery stenosis) ([place]) 05-2012   . Renal insufficiency   . SCC (squamous cell carcinoma)     Past Surgical History:  Procedure Laterality Date  . CARDIAC CATHETERIZATION    . CATARACT EXTRACTION    . CHOLECYSTECTOMY N/A 02/15/2014   Procedure: LAPAROSCOPIC CHOLECYSTECTOMY with IOC;  Surgeon: Gayland Curry, MD;  Location: [facility];  Service: General;  Laterality: N/A;  . COLONOSCOPY    . CORONARY ARTERY BYPASS GRAFT  1995  . ESOPHAGOGASTRODUODENOSCOPY N/A 03/02/2014   Procedure: ESOPHAGOGASTRODUODENOSCOPY (EGD);  Surgeon: Ladene Artist, MD;  Location: [facility] ENDOSCOPY;  Service: Endoscopy;  Laterality: N/A;  sarah/leone    Allergies as of 09/26/2020      Reactions   Hydrocodone-acetaminophen Other (See Comments)   REACTION: bladder  obstruction   Tramadol Hcl Other (See Comments)   REACTION: bladder obstruction      Medication List       Accurate as of September 26, 2020 10:29 PM. If you have any questions, ask your nurse or doctor.        acetaminophen 325 MG tablet Commonly known as: TYLENOL Take 2 tablets (650 mg total) by mouth every 6 (six) hours as needed for mild pain, moderate pain, fever or headache.   amLODipine 10 MG tablet Commonly known as: NORVASC Take 1 tablet (10 mg total) by mouth daily.   aspirin 81 MG tablet Take 81 mg by mouth daily.   atenolol 50 MG tablet Commonly known as: TENORMIN Take 1.5 tablets (75 mg total) by mouth daily.   atorvastatin 20 MG tablet Commonly known as: LIPITOR Take 1 tablet (20 mg total) by mouth daily.   benazepril 20 MG tablet Commonly known as: LOTENSIN Take 1 tablet (20 mg total) by mouth daily.   diazepam 10 MG tablet Commonly known as: VALIUM TAKE 1 TABLET (10 MG TOTAL) BY MOUTH EVERY 12 (TWELVE) HOURS AS NEEDED FOR ANXIETY OR SLEEP.   docusate sodium 100 MG capsule Commonly known as: COLACE Take 100 mg by mouth daily as needed (constipation).   fexofenadine 180 MG tablet Commonly known as: ALLEGRA  Take 180 mg by mouth daily.          Objective:   Physical Exam BP (!) 162/70 (BP Location: Left Arm, Patient Position: Sitting, Cuff Size: Small)   Pulse (!) 56   Temp 97.9 F (36.6 C) (Oral)   Resp 18   Ht 5\' 9"  (1.753 m)   Wt 190 lb 8 oz (86.4 kg)   SpO2 96%   BMI 28.13 kg/m  General: Well developed, NAD, BMI noted Neck: No  thyromegaly  HEENT:  Normocephalic . Face symmetric, atraumatic Lungs:  CTA B Normal respiratory effort, no intercostal retractions, no accessory muscle use. Heart: RRR,  no murmur.  Abdomen:  Not distended, soft, non-tender. No rebound or rigidity.   Lower extremities: no pretibial edema bilaterally  Skin: Exposed areas without rash. Not pale. Not jaundice Neurologic:  alert & oriented X3.  Speech  normal, gait appropriate for age and unassisted Strength symmetric and appropriate for age.  Psych: Cognition and judgment appear intact.  Cooperative with normal attention span and concentration.  Behavior appropriate. No anxious or depressed appearing.     Assessment    Assessment Prediabetes HTN Hyperlipidemia GERD, h/o Candida esophagitis 02-2014, nexium prn Insomnia, on diazepam MSK --  occ. 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  The rest are slightly higher but no more than 153. Continue amlodipine, Tenormin, Lotensin, labs. Hyperlipidemia: On Lipitor, labs. Insomnia: On diazepam, check UDS. CAD: On aspirin, controlling CV RF, no symptoms RTC 6 months  This visit occurred during the SARS-CoV-2 public health emergency.  Safety protocols were in place, including screening questions prior to the visit, additional usage of staff PPE, and extensive cleaning of exam room while observing appropriate contact time as indicated for disinfecting solutions.

## 2020-09-28 LAB — HEPATITIS C ANTIBODY
Hepatitis C Ab: NONREACTIVE
SIGNAL TO CUT-OFF: 0.01 (ref <1.00)

## 2020-09-28 LAB — DRUG MONITORING, PANEL 8 WITH CONFIRMATION, URINE
6 Acetylmorphine: NEGATIVE ng/mL (ref <10)
Alcohol Metabolites: NEGATIVE ng/mL
Alphahydroxyalprazolam: NEGATIVE ng/mL (ref <25)
Alphahydroxymidazolam: NEGATIVE ng/mL (ref <50)
Alphahydroxytriazolam: NEGATIVE ng/mL (ref <50)
Aminoclonazepam: NEGATIVE ng/mL (ref <25)
Amphetamines: NEGATIVE ng/mL (ref <500)
Benzodiazepines: POSITIVE ng/mL — AB (ref <100)
Buprenorphine, Urine: NEGATIVE ng/mL (ref <5)
Cocaine Metabolite: NEGATIVE ng/mL (ref <150)
Creatinine: 75 mg/dL
Hydroxyethylflurazepam: NEGATIVE ng/mL (ref <50)
Lorazepam: NEGATIVE ng/mL (ref <50)
MDMA: NEGATIVE ng/mL (ref <500)
Marijuana Metabolite: NEGATIVE ng/mL (ref <20)
Nordiazepam: 332 ng/mL — ABNORMAL HIGH (ref <50)
Opiates: NEGATIVE ng/mL (ref <100)
Oxazepam: 609 ng/mL — ABNORMAL HIGH (ref <50)
Oxidant: NEGATIVE ug/mL
Oxycodone: NEGATIVE ng/mL (ref <100)
Temazepam: 385 ng/mL — ABNORMAL HIGH (ref <50)
pH: 5.5 (ref 4.5–9.0)

## 2020-09-28 LAB — DM TEMPLATE

## 2020-11-06 ENCOUNTER — Other Ambulatory Visit: Payer: Self-pay | Admitting: Internal Medicine

## 2020-11-28 ENCOUNTER — Ambulatory Visit (HOSPITAL_COMMUNITY)
Admission: RE | Admit: 2020-11-28 | Discharge: 2020-11-28 | Disposition: A | Discharge status: Home / Self Care | Payer: Medicare Other | Source: Ambulatory Visit | Attending: Cardiovascular Disease | Admitting: Cardiovascular Disease

## 2020-11-28 ENCOUNTER — Other Ambulatory Visit: Payer: Self-pay

## 2020-11-28 ENCOUNTER — Other Ambulatory Visit (HOSPITAL_COMMUNITY): Payer: Self-pay | Admitting: Cardiovascular Disease

## 2020-11-28 DIAGNOSIS — Z8679 Personal history of other diseases of the circulatory system: Secondary | ICD-10-CM | POA: Diagnosis not present

## 2020-11-28 DIAGNOSIS — I701 Atherosclerosis of renal artery: Secondary | ICD-10-CM

## 2020-12-13 ENCOUNTER — Telehealth: Payer: Self-pay | Admitting: Internal Medicine

## 2020-12-13 NOTE — Telephone Encounter (Signed)
Requesting: diazepam 10mg  Contract: 05/29/2020 UDS: 09/26/2020 Last Visit: 09/26/2020 Next Visit: 03/30/21 Last Refill: 08/16/2020 #30 and 2RF Pt sig; 1 tab q12h prn  Please Advise

## 2020-12-13 NOTE — Telephone Encounter (Signed)
PDMP okay, Rx sent 

## 2021-01-08 ENCOUNTER — Telehealth: Payer: Self-pay

## 2021-01-08 NOTE — Telephone Encounter (Signed)
Noted, thx.

## 2021-01-08 NOTE — Telephone Encounter (Signed)
Attempted to contact patient, no answer.   [facility] Day - Client TELEPHONE ADVICE RECORD AccessNurse Patient Name: Dustin Ashley Gender: Male DOB: 10-Apr-1941 Age: 80 Y 2 M 6 D Return Phone Number: [phone] (Primary), [phone] (Secondary) Client [facility] - Client Client Site [facility] - Day Contact Type Call Who Is Calling Patient / Member / Family / Caregiver Call Type Triage / Clinical Relationship To Patient Self Return Phone Number [phone] (Primary) Chief Complaint Weakness, Generalized Reason for Call Symptomatic / Request for [place] states that he was feeling faint yesterday. He was sweating and his blood pressure was 95/52, but went up to 104/67 this morning. He doesn't have any energy. He doesn't feel faint today, just weak. Translation No Nurse Assessment Nurse: Altamease Oiler, RN, Adriana Date/Time (Eastern Time): 01/05/2021 11:55:56 AM Confirm and document reason for call. If symptomatic, describe symptoms. ---pt states he was working out yesterday but stopped as he was not feeling well. stopped for food. felt shaky, sat down to eat and got up and got shaky. bp was 95/52, then last night 128/67. this am 140/68, feels better but feels like he has no energy Does the patient have any new or worsening symptoms? ---Yes Will a triage be completed? ---Yes Related visit to physician within the last 2 weeks? ---No Does the PT have any chronic conditions? (i.e. diabetes, asthma, this includes High risk factors for pregnancy, etc.) ---Yes List chronic conditions. ---high cholesterol htn- on 3 pills Is this a behavioral health or substance abuse call? ---No Guidelines Guideline Title Affirmed Question Affirmed Notes Nurse Date/Time (Eastern Time) Weakness (Generalized) and Fatigue [1] MODERATE weakness (i.e., interferes with work, school, normal activities)  AND [2] cause unknown (Exceptions: weakness with acute Altamease Oiler, RN, Adriana 01/05/2021 11:59:52 AM PLEASE NOTE: All timestamps contained within this report are represented as [place] Standard Time. CONFIDENTIALTY NOTICE: This fax transmission is intended only for the addressee. It contains information that is legally privileged, confidential or otherwise protected from use or disclosure. If you are not the intended recipient, you are strictly prohibited from reviewing, disclosing, copying using or disseminating any of this information or taking any action in reliance on or regarding this information. If you have received this fax in error, please notify us immediately by telephone so that we can arrange for its return to [place]. Phone: [phone], Toll-Free: [phone], Fax: [phone] Page: 2 of 2 Call Id: [id number] Guidelines Guideline Title Affirmed Question Affirmed Notes Nurse Date/Time Eilene Ghazi Time) minor illness, or weakness from poor fluid intake) Disp. Time Eilene Ghazi Time) Disposition Final User 01/05/2021 12:07:57 PM See HCP within 4 Hours (or PCP triage) Yes Altamease Oiler, RN, [place] Disagree/Comply Disagree Caller Understands Yes PreDisposition Call Doctor Care Advice Given Per Guideline SEE HCP (OR PCP TRIAGE) WITHIN 4 HOURS: CALL BACK IF: * You become worse CARE ADVICE given per Weakness and Fatigue (Adult) guideline. Comments User: Kizzie Fantasia, RN Date/Time Eilene Ghazi Time): 01/05/2021 12:07:01 PM no appts available at this time Referrals REFERRED TO PCP OFFICE

## 2021-01-08 NOTE — Telephone Encounter (Signed)
Spoke with patient regarding triage call.  Patient reports dizziness during workout. States he only had toast and applesauce before going to the gym.  After eating, symptoms resolved.  Denies any further symptoms/issues.   Advised to eat prior to working out and keep snack available. Also advised to call with any additional symptoms/concerns. Patient verbalized understanding.

## 2021-02-12 ENCOUNTER — Other Ambulatory Visit: Payer: Self-pay

## 2021-02-12 ENCOUNTER — Ambulatory Visit: Payer: Medicare Other | Admitting: Cardiovascular Disease

## 2021-02-12 ENCOUNTER — Encounter: Payer: Self-pay | Admitting: Cardiovascular Disease

## 2021-02-12 VITALS — BP 152/60 | HR 55 | Ht 69.0 in | Wt 188.8 lb

## 2021-02-12 DIAGNOSIS — I2510 Atherosclerotic heart disease of native coronary artery without angina pectoris: Secondary | ICD-10-CM | POA: Diagnosis not present

## 2021-02-12 DIAGNOSIS — I701 Atherosclerosis of renal artery: Secondary | ICD-10-CM | POA: Diagnosis not present

## 2021-02-12 DIAGNOSIS — E782 Mixed hyperlipidemia: Secondary | ICD-10-CM | POA: Diagnosis not present

## 2021-02-12 DIAGNOSIS — I10 Essential (primary) hypertension: Secondary | ICD-10-CM | POA: Diagnosis not present

## 2021-02-12 NOTE — Patient Instructions (Signed)
Medication Instructions:  Your physician recommends that you continue on your current medications as directed. Please refer to the Current Medication list given to you today.  *If you need a refill on your cardiac medications before your next appointment, please call your pharmacy*   Lab Work: None If you have labs (blood work) drawn today and your tests are completely normal, you will receive your results only by: [place] (if you have MyChart) OR A paper copy in the mail If you have any lab test that is abnormal or we need to change your treatment, we will call you to review the results.   Testing/Procedures: None   Follow-Up: At [facility], you and your health needs are our priority.  As part of our continuing mission to provide you with exceptional heart care, we have created designated Provider Care Teams.  These Care Teams include your primary Cardiologist (physician) and Advanced Practice Providers (APPs -  Physician Assistants and Nurse Practitioners) who all work together to provide you with the care you need, when you need it.  We recommend signing up for the patient portal called "MyChart".  Sign up information is provided on this After Visit Summary.  MyChart is used to connect with patients for Virtual Visits (Telemedicine).  Patients are able to view lab/test results, encounter notes, upcoming appointments, etc.  Non-urgent messages can be sent to your provider as well.   To learn more about what you can do with MyChart, go to [email or web address].    Your next appointment:   1 year(s)  The format for your next appointment:   In Person  Provider:   You may see Sherren Mocha, MD or one of the following Advanced Practice Providers on your designated Care Team:   Richardson Dopp, PA-C Robbie Lis, [place]   Other Instructions

## 2021-02-12 NOTE — Progress Notes (Signed)
Cardiology Office Note:    Date:  02/12/2021   ID:  Dustin Ashley, DOB 1941/01/29, MRN [id number]  PCP:  Colon Branch, MD   [facility] Providers Cardiologist:  Sherren Mocha, MD     Referring MD: Colon Branch, MD   Chief Complaint  Patient presents with   Coronary Artery Disease     History of Present Illness:    Dustin Ashley is a 80 y.o. male with a hx of renal artery stenosis, presenting for follow-up evaluation.  The patient has coronary artery disease with remote CABG, hypertension, and mixed hyperlipidemia.  He has been followed medically with renal artery stenosis.  The patient has bilateral renal artery stenosis with normal size kidneys by Doppler ultrasound criteria. BP this am was 134/70 mmHg. He brings in BP readings and they are in an excellent range. On average his BP is ranging from the 120-130's/60-70's.   He is here alone today.  His wife passed away a few years ago had an automobile accident.  The patient is back to exercising regularly at the downtown [facility].  He denies any exertional symptoms.  He specifically denies chest pain, chest pressure, or shortness of breath.  He has mild chronic leg swelling and wears compression stockings.  Reports no changes in his health over the past year.  Past Medical History:  Diagnosis Date   Allergic rhinitis    Allergy    seasonal   Arthritis    Bell palsy    CAD (coronary artery disease)    Cataract    Chronic renal insufficiency, stage II (mild)    Cr ~ 1.4, u/s 4-12 normal kidneys   Gallstone pancreatitis 2015   GERD (gastroesophageal reflux disease)    HOH (hard of hearing)    has a hearing aid L, sees audiology rountinely   Hyperglycemia    A1C 5.8 04-2010   Hyperlipemia    Hypertension    Pain, joint, multiple sites    uses valium, occ uses for insomnia. uses for shoulder  pain   Paroxysmal atrial fibrillation (HCC)    Personal history of colonic polyps    Pulmonary emboli ([place])    02-2014   RAS (renal  artery stenosis) ([place]) 05-2012    Renal insufficiency    SCC (squamous cell carcinoma)     Past Surgical History:  Procedure Laterality Date   CARDIAC CATHETERIZATION     CATARACT EXTRACTION     CHOLECYSTECTOMY N/A 02/15/2014   Procedure: LAPAROSCOPIC CHOLECYSTECTOMY with [place];  Surgeon: Gayland Curry, MD;  Location: [facility];  Service: General;  Laterality: N/A;   COLONOSCOPY     CORONARY ARTERY BYPASS GRAFT  1995   ESOPHAGOGASTRODUODENOSCOPY N/A 03/02/2014   Procedure: ESOPHAGOGASTRODUODENOSCOPY (EGD);  Surgeon: Ladene Artist, MD;  Location: [facility] ENDOSCOPY;  Service: Endoscopy;  Laterality: N/A;  sarah/leone    Current Medications: Current Meds  Medication Sig   acetaminophen (TYLENOL) 325 MG tablet Take 2 tablets (650 mg total) by mouth every 6 (six) hours as needed for mild pain, moderate pain, fever or headache.   amLODipine (NORVASC) 10 MG tablet Take 1 tablet (10 mg total) by mouth daily.   aspirin 81 MG tablet Take 81 mg by mouth daily.   atenolol (TENORMIN) 50 MG tablet Take 1.5 tablets (75 mg total) by mouth daily.   atorvastatin (LIPITOR) 20 MG tablet Take 1 tablet (20 mg total) by mouth daily.   benazepril (LOTENSIN) 20 MG tablet Take 1 tablet (20 mg  total) by mouth daily.   diazepam (VALIUM) 10 MG tablet TAKE 1 TABLET (10 MG TOTAL) BY MOUTH EVERY 12 (TWELVE) HOURS AS NEEDED FOR ANXIETY OR SLEEP.   docusate sodium (COLACE) 100 MG capsule Take 100 mg by mouth daily as needed (constipation).   fexofenadine (ALLEGRA) 180 MG tablet Take 180 mg by mouth daily.     Allergies:   Hydrocodone-acetaminophen and Tramadol hcl   Social History   Socioeconomic History   Marital status: Widowed    Spouse name: Not on file   Number of children: 1   Years of education: Not on file   Highest education level: Not on file  Occupational History   Occupation: retired, delivery truck driver  Tobacco Use   Smoking status: Former    Types: Cigarettes    Quit date: 03/13/1976    Years since  quitting: 44.9   Smokeless tobacco: Never  Vaping Use   Vaping Use: Never used  Substance and Sexual Activity   Alcohol use: No    Alcohol/week: 0.0 standard drinks   Drug use: No   Sexual activity: Not on file  Other Topics Concern   Not on file  Social History Narrative   Born in [place], has a large extended family, oldest of several brothers-sister    Son anf G son live near by    Lost wife 2019. Lives by himself    Social Determinants of Health   Financial Resource Strain: Not on file  Food Insecurity: Not on file  Transportation Needs: Not on file  Physical Activity: Not on file  Stress: Not on file  Social Connections: Not on file     Family History: The patient's family history includes Heart attack in his brother; Hypertension in his father. There is no history of Diabetes, Colon cancer, Prostate cancer, Rectal cancer, or Stomach cancer.  ROS:   Please see the history of present illness.    All other systems reviewed and are negative.  EKGs/Labs/Other Studies Reviewed:    The following studies were reviewed today: Renal arterial Doppler: Summary:  Largest Aortic Diameter: 1.9 cm     Renal:     Right: Normal size right kidney. Abnormal right Resistive Index.         Abnormal cortical thickness of right kidney. RRV flow         present. Evidence of a greater than 60% stenosis of the right         renal artery. Stenosis is based on PSV; RAR is not 3.5 or         greater. Essentially stable from prior exam.  Left:  Normal size of left kidney. Abnormal left Resistive Index.         Normal cortical thickness of the left kidney. LRV flow         present. Evidence of a > 60% stenosis in the left renal         artery. Stenosis is based on PSV; RAR is not 3.5 or greater.         Essentially stable from prior exam.  Mesenteric:  70 to 99% stenosis in the celiac artery and superior mesenteric artery.     Patent IVC.   EKG:  EKG is ordered today.  The ekg  ordered today demonstrates sinus bradycardia 55 bpm, within normal limits.  Recent Labs: 05/29/2020: Hemoglobin 14.6; Platelets 167.0 09/26/2020: ALT 17; BUN 29; Creatinine, Ser 1.50; Potassium 4.2; Sodium 141  Recent Lipid Panel  Component Value Date/Time   CHOL 120 09/26/2020 1020   TRIG 131.0 09/26/2020 1020   TRIG 92 06/26/2006 1042   HDL 37.80 (L) 09/26/2020 1020   CHOLHDL 3 09/26/2020 1020   VLDL 26.2 09/26/2020 1020   LDLCALC 56 09/26/2020 1020     Risk Assessment/Calculations:           Physical Exam:    VS:  BP (!) 152/60   Pulse (!) 55   Ht '5\' 9"'$  (1.753 m)   Wt 188 lb 12.8 oz (85.6 kg)   SpO2 95%   BMI 27.88 kg/m     Wt Readings from Last 3 Encounters:  02/12/21 188 lb 12.8 oz (85.6 kg)  09/26/20 190 lb 8 oz (86.4 kg)  05/29/20 187 lb 4 oz (84.9 kg)     GEN:  Well nourished, well developed in no acute distress HEENT: Normal NECK: No JVD; No carotid bruits LYMPHATICS: No lymphadenopathy CARDIAC: RRR, no murmurs, rubs, gallops RESPIRATORY:  Clear to auscultation without rales, wheezing or rhonchi  ABDOMEN: Soft, non-tender, non-distended MUSCULOSKELETAL:  No edema; No deformity  SKIN: Warm and dry NEUROLOGIC:  Alert and oriented x 3 PSYCHIATRIC:  Normal affect   ASSESSMENT:    1. Essential hypertension   2. Renal artery stenosis ([place])   3. Mixed hyperlipidemia   4. Coronary artery disease involving native coronary artery of native heart without angina pectoris    PLAN:    In order of problems listed above:  Blood pressure well controlled on multidrug therapy based on his home readings.  He will continue amlodipine, atenolol, and benazepril.  Labs are reviewed. Discussed findings of recent arterial Doppler which shows stable but severe bilateral renal artery stenosis.  Creatinine is 1.55.  He continues to tolerate an ACE inhibitor.  We discussed the importance of good hydration.  Treated with atorvastatin 20 mg daily. Remote CABG in 1992.   Doing well with no angina.  Treated with aspirin, calcium and beta-blockers, and a statin drug.  Medication Adjustments/Labs and Tests Ordered: Current medicines are reviewed at length with the patient today.  Concerns regarding medicines are outlined above.  Orders Placed This Encounter  Procedures   EKG 12-Lead   No orders of the defined types were placed in this encounter.   Patient Instructions  Medication Instructions:  Your physician recommends that you continue on your current medications as directed. Please refer to the Current Medication list given to you today.  *If you need a refill on your cardiac medications before your next appointment, please call your pharmacy*   Lab Work: None If you have labs (blood work) drawn today and your tests are completely normal, you will receive your results only by: [place] (if you have MyChart) OR A paper copy in the mail If you have any lab test that is abnormal or we need to change your treatment, we will call you to review the results.   Testing/Procedures: None   Follow-Up: At [facility], you and your health needs are our priority.  As part of our continuing mission to provide you with exceptional heart care, we have created designated Provider Care Teams.  These Care Teams include your primary Cardiologist (physician) and Advanced Practice Providers (APPs -  Physician Assistants and Nurse Practitioners) who all work together to provide you with the care you need, when you need it.  We recommend signing up for the patient portal called "MyChart".  Sign up information is provided on this After Visit Summary.  MyChart is used to connect with patients for Virtual Visits (Telemedicine).  Patients are able to view lab/test results, encounter notes, upcoming appointments, etc.  Non-urgent messages can be sent to your provider as well.   To learn more about what you can do with MyChart, go to [email or web address].    Your  next appointment:   1 year(s)  The format for your next appointment:   In Person  Provider:   You may see Sherren Mocha, MD or one of the following Advanced Practice Providers on your designated Care Team:   Richardson Dopp, PA-C Robbie Lis, [place]   Other Instructions     Signed, Sherren Mocha, MD  02/12/2021 3:57 PM    [place]

## 2021-03-02 ENCOUNTER — Ambulatory Visit (INDEPENDENT_AMBULATORY_CARE_PROVIDER_SITE_OTHER): Payer: Medicare Other | Admitting: Family Medicine

## 2021-03-02 ENCOUNTER — Other Ambulatory Visit: Payer: Self-pay

## 2021-03-02 ENCOUNTER — Encounter: Payer: Self-pay | Admitting: Family

## 2021-03-02 ENCOUNTER — Ambulatory Visit: Payer: Self-pay

## 2021-03-02 ENCOUNTER — Ambulatory Visit (INDEPENDENT_AMBULATORY_CARE_PROVIDER_SITE_OTHER): Payer: Medicare Other | Admitting: Family

## 2021-03-02 VITALS — BP 170/70 | HR 71 | Temp 98.5°F | Ht 69.0 in | Wt 192.0 lb

## 2021-03-02 VITALS — Ht 69.0 in | Wt 192.2 lb

## 2021-03-02 DIAGNOSIS — M778 Other enthesopathies, not elsewhere classified: Secondary | ICD-10-CM | POA: Insufficient documentation

## 2021-03-02 DIAGNOSIS — M25511 Pain in right shoulder: Secondary | ICD-10-CM

## 2021-03-02 MED ORDER — TRIAMCINOLONE ACETONIDE 40 MG/ML IJ SUSP
40.0000 mg | Freq: Once | INTRAMUSCULAR | Status: AC
  Administered 2021-03-02: 40 mg via INTRA_ARTICULAR

## 2021-03-02 MED ORDER — HYDROCODONE-ACETAMINOPHEN 5-325 MG PO TABS: 1.0000 | ORAL_TABLET | Freq: Three times a day (TID) | ORAL | 0 refills | Status: DC | PRN

## 2021-03-02 NOTE — Assessment & Plan Note (Signed)
Having severe pain at the shoulder.  Seems to have a either capsulitis versus exacerbation of underlying degenerative changes. -Counseled on home exercise therapy and supportive care. -Injection today. -Norco. -Could consider physical therapy

## 2021-03-02 NOTE — Progress Notes (Signed)
Dustin Ashley - 80 y.o. male MRN [id number]  Date of birth: 1941/07/04  SUBJECTIVE:  Including CC & ROS.  No chief complaint on file.   Dustin Ashley is a 80 y.o. male that is presenting with acute right shoulder pain.  The pain is been ongoing for the past few days.  Having trouble with range of motion.  Unable to sleep on his back at night.   Review of Systems See HPI   HISTORY: Past Medical, Surgical, Social, and Family History Reviewed & Updated per EMR.   Pertinent Historical Findings include:  Past Medical History:  Diagnosis Date   Allergic rhinitis    Allergy    seasonal   Arthritis    Bell palsy    CAD (coronary artery disease)    Cataract    Chronic renal insufficiency, stage II (mild)    Cr ~ 1.4, u/s 4-12 normal kidneys   Gallstone pancreatitis 2015   GERD (gastroesophageal reflux disease)    HOH (hard of hearing)    has a hearing aid L, sees audiology rountinely   Hyperglycemia    A1C 5.8 04-2010   Hyperlipemia    Hypertension    Pain, joint, multiple sites    uses valium, occ uses for insomnia. uses for shoulder  pain   Paroxysmal atrial fibrillation (HCC)    Personal history of colonic polyps    Pulmonary emboli ([place])    02-2014   RAS (renal artery stenosis) ([place]) 05-2012    Renal insufficiency    SCC (squamous cell carcinoma)     Past Surgical History:  Procedure Laterality Date   CARDIAC CATHETERIZATION     CATARACT EXTRACTION     CHOLECYSTECTOMY N/A 02/15/2014   Procedure: LAPAROSCOPIC CHOLECYSTECTOMY with [place];  Surgeon: Gayland Curry, MD;  Location: [facility];  Service: General;  Laterality: N/A;   COLONOSCOPY     CORONARY ARTERY BYPASS GRAFT  1995   ESOPHAGOGASTRODUODENOSCOPY N/A 03/02/2014   Procedure: ESOPHAGOGASTRODUODENOSCOPY (EGD);  Surgeon: Ladene Artist, MD;  Location: [facility] ENDOSCOPY;  Service: Endoscopy;  Laterality: N/A;  sarah/leone    Family History  Problem Relation Age of Onset   Hypertension Father    Heart attack Brother         2 brothers, CABG   Diabetes Neg Hx    Colon cancer Neg Hx    Prostate cancer Neg Hx        colon, prostate   Rectal cancer Neg Hx    Stomach cancer Neg Hx     Social History   Socioeconomic History   Marital status: Widowed    Spouse name: Not on file   Number of children: 1   Years of education: Not on file   Highest education level: Not on file  Occupational History   Occupation: retired, delivery truck driver  Tobacco Use   Smoking status: Former    Types: Cigarettes    Quit date: 03/13/1976    Years since quitting: 45.0   Smokeless tobacco: Never  Vaping Use   Vaping Use: Never used  Substance and Sexual Activity   Alcohol use: No    Alcohol/week: 0.0 standard drinks   Drug use: No   Sexual activity: Not on file  Other Topics Concern   Not on file  Social History Narrative   Born in [place], has a large extended family, oldest of several brothers-sister    Son anf G son live near by    Lost wife 2019. Lives  by himself    Social Determinants of Radio broadcast assistant Strain: Not on file  Food Insecurity: Not on file  Transportation Needs: Not on file  Physical Activity: Not on file  Stress: Not on file  Social Connections: Not on file  Intimate Partner Violence: Not on file     PHYSICAL EXAM:  VS: Ht '5\' 9"'$  (1.753 m)   Wt 192 lb 3.2 oz (87.2 kg)   BMI 28.38 kg/m  Physical Exam Gen: NAD, alert, cooperative with exam, well-appearing MSK:  Right shoulder: Limited range of motion external rotation and abduction. Normal strength resistance.  Line no swelling or ecchymosis. Neurovascular intact  Limited ultrasound: Right shoulder:  Normal-appearing biceps tendon but does have an encircling effusion. Normal-appearing subscapularis. Mild degenerative changes of the supraspinatus.  Summary: Effusion of biceps tendon  Ultrasound and interpretation by Clearance Coots, MD   Aspiration/Injection Procedure Note Dustin Ashley [birth year]  Procedure: Injection Indications: Right shoulder pain  Procedure Details Consent: Risks of procedure as well as the alternatives and risks of each were explained to the (patient/caregiver).  Consent for procedure obtained. Time Out: Verified patient identification, verified procedure, site/side was marked, verified correct patient position, special equipment/implants available, medications/allergies/relevent history reviewed, required imaging and test results available.  Performed.  The area was cleaned with iodine and alcohol swabs.    The right glenohumeral joint was injected using 3 cc 1% lidocaine on a 22-gauge 3-1/2 inch needle.  The syringe was switched to mixture containing 1 cc's of 40 mg Kenalog and 4 cc's of 0.25% bupivacaine was injected.  Ultrasound was used. Images were obtained in long views showing the injection.     A sterile dressing was applied.  Patient did tolerate procedure well.    ASSESSMENT & PLAN:   Capsulitis of right shoulder Having severe pain at the shoulder.  Seems to have a either capsulitis versus exacerbation of underlying degenerative changes. -Counseled on home exercise therapy and supportive care. -Injection today. -Norco. -Could consider physical therapy

## 2021-03-02 NOTE — Patient Instructions (Signed)
Nice to meet you Please try heat and ice  Please try the exercises  Please use the pain medicine as needed  Please send me a message in MyChart with any questions or updates.  Please see me back in 2 weeks.   --Dr. Raeford Razor

## 2021-03-02 NOTE — Progress Notes (Signed)
Dustin Ashley is a 80 y.o. 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) 12/13/2014   Elevated TSH 04/12/2014   Atrial fibrillation ([place]) 03/15/2014   Pulmonary embolism- DVT 02-2014, provoked  02/26/2014   RAS (renal artery stenosis) ([place]) 07/01/2012   Annual physical exam 03/14/2011   Chronic renal insufficiency, stage II (mild)    Pain, joint, multiple sites    CPK, ABNORMAL 11/27/2009   DRY SKIN 07/25/2008   ERECTILE DYSFUNCTION 03/28/2008   FATIGUE 03/28/2008   BACK PAIN 12/01/2006   HYPERGLYCEMIA 12/01/2006   HYPERLIPIDEMIA 08/07/2006   Essential hypertension 08/07/2006   CORONARY ARTERY DISEASE 08/07/2006   ALLERGIC RHINITIS 08/07/2006   GERD (and candida esophagitis 02-2014) 08/07/2006   History of colonic polyps 02/17/2003    Current Outpatient Medications  Medication Sig Dispense Refill   amLODipine (NORVASC) 10 MG tablet Take 1 tablet (10 mg total) by mouth daily. 90 tablet 1   aspirin 81 MG tablet Take 81 mg by mouth daily.     atenolol (TENORMIN) 50 MG tablet Take 1.5 tablets (75 mg total) by mouth daily. 135 tablet 1   atorvastatin (LIPITOR) 20 MG tablet Take 1 tablet (20 mg total) by mouth daily. 90 tablet 1   benazepril (LOTENSIN) 20 MG tablet Take 1 tablet (20 mg total) by mouth daily. 90 tablet 1   diazepam (VALIUM) 10 MG tablet TAKE 1 TABLET (10 MG TOTAL) BY MOUTH EVERY 12 (TWELVE) HOURS AS NEEDED FOR ANXIETY OR SLEEP. 30 tablet 2   docusate sodium (COLACE) 100 MG capsule Take 100 mg by mouth daily as needed (constipation).     fexofenadine (ALLEGRA) 180 MG tablet Take 180 mg by mouth daily.     HYDROcodone-acetaminophen (NORCO/VICODIN) 5-325 MG tablet Take 1 tablet by mouth every 8 (eight) hours as needed. 15 tablet 0   No current facility-administered medications for this visit.     Allergies: Hydrocodone-acetaminophen and Tramadol hcl  Past Medical History:  Diagnosis Date   Allergic rhinitis    Allergy    seasonal   Arthritis    Bell palsy    CAD (coronary artery disease)    Cataract    Chronic renal insufficiency, stage II (mild)    Cr ~ 1.4, u/s 4-12 normal kidneys   Gallstone pancreatitis 2015   GERD (gastroesophageal reflux disease)    HOH (hard of hearing)    has a hearing aid L, sees audiology rountinely   Hyperglycemia    A1C 5.8 04-2010   Hyperlipemia    Hypertension    Pain, joint, multiple sites    uses valium, occ uses for insomnia. uses for shoulder  pain   Paroxysmal atrial fibrillation [facility])    Personal history of colonic polyps    Pulmonary emboli ([place])    02-2014   RAS (renal artery stenosis) ([place]) 05-2012    Renal insufficiency    SCC (squamous cell carcinoma)     Past Surgical History:  Procedure Laterality Date   CARDIAC CATHETERIZATION     CATARACT EXTRACTION     CHOLECYSTECTOMY N/A 02/15/2014   Procedure: LAPAROSCOPIC CHOLECYSTECTOMY with [place];  Surgeon: Gayland Curry, MD;  Location: [facility];  Service: General;  Laterality: N/A;   COLONOSCOPY     CORONARY ARTERY BYPASS GRAFT  1995   ESOPHAGOGASTRODUODENOSCOPY N/A 03/02/2014   Procedure:  ESOPHAGOGASTRODUODENOSCOPY (EGD);  Surgeon: Ladene Artist, MD;  Location: [facility] ENDOSCOPY;  Service: Endoscopy;  Laterality: N/A;  sarah/leone    Family History  Problem Relation Age of Onset   Hypertension Father    Heart attack Brother        2 brothers, CABG   Diabetes Neg Hx    Colon cancer Neg Hx    Prostate cancer Neg Hx        colon, prostate   Rectal cancer Neg Hx    Stomach cancer Neg Hx     Social History   Tobacco Use   Smoking status: Former    Types: Cigarettes    Quit date: 03/13/1976    Years since quitting: 45.0   Smokeless tobacco: Never  Substance Use Topics   Alcohol use: No    Alcohol/week: 0.0 standard drinks    Subjective:  Patient presents with concerns for  sudden onset/ worsening pain in right shoulder and right neck; notes pain is radiating down into his fingertips but does not feel numb or tingly; notes he cannot lift his right arm easily; no obvious injury but symptoms started on Tuesday evening- [facility];    Objective:  Vitals:   03/02/21 0937  BP: (!) 170/70  Pulse: 71  Temp: 98.5 F (36.9 C)  TempSrc: Oral  SpO2: 96%  Weight: 192 lb (87.1 kg)  Height: '5\' 9"'$  (1.753 m)    General: Well developed, well nourished, in no acute distress  Skin : Warm and dry.  Head: Normocephalic and atraumatic  Lungs: Respirations unlabored;  Musculoskeletal: No deformities; limited range of motion in right shoulder Extremities: No edema, cyanosis, clubbing  Vessels: Symmetric bilaterally  Neurologic: Alert and oriented; speech intact; face symmetrical;   Assessment:  1. Acute pain of right shoulder     Plan:  Due to patient's appearance in the office, am concern for possible tear; he is referred for immediate evaluation/ probable ultrasound of upper arm; he will follow up here as needed after seeing sports medicine.   This visit occurred during the SARS-CoV-2 public health emergency.  Safety protocols were in place, including screening questions prior to the visit, additional usage of staff PPE, and extensive cleaning of exam room while observing appropriate contact time as indicated for disinfecting solutions.    No follow-ups on file.  No orders of the defined types were placed in this encounter.   Requested Prescriptions    No prescriptions requested or ordered in this encounter

## 2021-03-16 ENCOUNTER — Other Ambulatory Visit: Payer: Self-pay

## 2021-03-16 ENCOUNTER — Encounter: Payer: Self-pay | Admitting: Family Medicine

## 2021-03-16 ENCOUNTER — Ambulatory Visit: Payer: Medicare Other | Admitting: Family Medicine

## 2021-03-16 DIAGNOSIS — M778 Other enthesopathies, not elsewhere classified: Secondary | ICD-10-CM | POA: Diagnosis not present

## 2021-03-16 NOTE — Assessment & Plan Note (Signed)
Significant improvement with injection.  May have been more of a synovitis. -Counseled on home exercise therapy and supportive care. -Could consider physical therapy or imaging.

## 2021-03-16 NOTE — Progress Notes (Signed)
Dustin Ashley - 80 y.o. male MRN [id number]  Date of birth: 06-21-1941  SUBJECTIVE:  Including CC & ROS.  No chief complaint on file.   Dustin Ashley is a 80 y.o. male that is following up for right shoulder pain.  He has had significant improvement since the injection.  He is having normal range of motion.   Review of Systems See HPI   HISTORY: Past Medical, Surgical, Social, and Family History Reviewed & Updated per EMR.   Pertinent Historical Findings include:  Past Medical History:  Diagnosis Date   Allergic rhinitis    Allergy    seasonal   Arthritis    Bell palsy    CAD (coronary artery disease)    Cataract    Chronic renal insufficiency, stage II (mild)    Cr ~ 1.4, u/s 4-12 normal kidneys   Gallstone pancreatitis 2015   GERD (gastroesophageal reflux disease)    HOH (hard of hearing)    has a hearing aid L, sees audiology rountinely   Hyperglycemia    A1C 5.8 04-2010   Hyperlipemia    Hypertension    Pain, joint, multiple sites    uses valium, occ uses for insomnia. uses for shoulder  pain   Paroxysmal atrial fibrillation (HCC)    Personal history of colonic polyps    Pulmonary emboli ([place])    02-2014   RAS (renal artery stenosis) ([place]) 05-2012    Renal insufficiency    SCC (squamous cell carcinoma)     Past Surgical History:  Procedure Laterality Date   CARDIAC CATHETERIZATION     CATARACT EXTRACTION     CHOLECYSTECTOMY N/A 02/15/2014   Procedure: LAPAROSCOPIC CHOLECYSTECTOMY with [place];  Surgeon: Gayland Curry, MD;  Location: [facility];  Service: General;  Laterality: N/A;   COLONOSCOPY     CORONARY ARTERY BYPASS GRAFT  1995   ESOPHAGOGASTRODUODENOSCOPY N/A 03/02/2014   Procedure: ESOPHAGOGASTRODUODENOSCOPY (EGD);  Surgeon: Ladene Artist, MD;  Location: [facility] ENDOSCOPY;  Service: Endoscopy;  Laterality: N/A;  sarah/leone    Family History  Problem Relation Age of Onset   Hypertension Father    Heart attack Brother        2 brothers, CABG   Diabetes Neg  Hx    Colon cancer Neg Hx    Prostate cancer Neg Hx        colon, prostate   Rectal cancer Neg Hx    Stomach cancer Neg Hx     Social History   Socioeconomic History   Marital status: Widowed    Spouse name: Not on file   Number of children: 1   Years of education: Not on file   Highest education level: Not on file  Occupational History   Occupation: retired, delivery truck driver  Tobacco Use   Smoking status: Former    Types: Cigarettes    Quit date: 03/13/1976    Years since quitting: 45.0   Smokeless tobacco: Never  Vaping Use   Vaping Use: Never used  Substance and Sexual Activity   Alcohol use: No    Alcohol/week: 0.0 standard drinks   Drug use: No   Sexual activity: Not on file  Other Topics Concern   Not on file  Social History Narrative   Born in [place], has a large extended family, oldest of several brothers-sister    Son anf G son live near by    Lost wife 2019. Lives by himself    Social Determinants of Health  Financial Resource Strain: Not on file  Food Insecurity: Not on file  Transportation Needs: Not on file  Physical Activity: Not on file  Stress: Not on file  Social Connections: Not on file  Intimate Partner Violence: Not on file     PHYSICAL EXAM:  VS: Ht '5\' 9"'$  (1.753 m)   Wt 192 lb (87.1 kg)   BMI 28.35 kg/m  Physical Exam Gen: NAD, alert, cooperative with exam, well-appearing     ASSESSMENT & PLAN:   Capsulitis of right shoulder Significant improvement with injection.  May have been more of a synovitis. -Counseled on home exercise therapy and supportive care. -Could consider physical therapy or imaging.

## 2021-03-21 ENCOUNTER — Telehealth: Payer: Self-pay | Admitting: Internal Medicine

## 2021-03-21 NOTE — Telephone Encounter (Signed)
Requesting: diazepam '10mg'$  Contract: 05/29/20 UDS: 09/26/20 Last Visit: 09/26/2020 Next Visit: 03/30/2021 Last Refill: 12/13/2020 #30 and 2RF  Please Advise

## 2021-03-21 NOTE — Telephone Encounter (Signed)
PDMP okay, he did receive a small amount of vicodin appropriately.

## 2021-03-30 ENCOUNTER — Other Ambulatory Visit: Payer: Self-pay

## 2021-03-30 ENCOUNTER — Ambulatory Visit (INDEPENDENT_AMBULATORY_CARE_PROVIDER_SITE_OTHER): Payer: Medicare Other | Admitting: Internal Medicine

## 2021-03-30 ENCOUNTER — Encounter: Payer: Self-pay | Admitting: Internal Medicine

## 2021-03-30 VITALS — BP 144/70 | HR 55 | Temp 97.8°F | Resp 16 | Ht 69.0 in | Wt 185.5 lb

## 2021-03-30 DIAGNOSIS — Z23 Encounter for immunization: Secondary | ICD-10-CM

## 2021-03-30 DIAGNOSIS — Z7185 Encounter for immunization safety counseling: Secondary | ICD-10-CM | POA: Diagnosis not present

## 2021-03-30 DIAGNOSIS — I10 Essential (primary) hypertension: Secondary | ICD-10-CM | POA: Diagnosis not present

## 2021-03-30 LAB — BASIC METABOLIC PANEL
BUN: 31 mg/dL — ABNORMAL HIGH (ref 6–23)
CO2: 24 mEq/L (ref 19–32)
Calcium: 9 mg/dL (ref 8.4–10.5)
Chloride: 107 mEq/L (ref 96–112)
Creatinine, Ser: 1.45 mg/dL (ref 0.40–1.50)
GFR: 45.54 mL/min — ABNORMAL LOW (ref >60.00)
Glucose, Bld: 94 mg/dL (ref 70–99)
Potassium: 4.4 mEq/L (ref 3.5–5.1)
Sodium: 140 mEq/L (ref 135–145)

## 2021-03-30 LAB — CBC WITH DIFFERENTIAL/PLATELET
Basophils Absolute: 0.1 10*3/uL (ref 0.0–0.1)
Basophils Relative: 0.7 % (ref 0.0–3.0)
Eosinophils Absolute: 0.3 10*3/uL (ref 0.0–0.7)
Eosinophils Relative: 3.1 % (ref 0.0–5.0)
HCT: 41.8 % (ref 39.0–52.0)
Hemoglobin: 14 g/dL (ref 13.0–17.0)
Lymphocytes Relative: 29 % (ref 12.0–46.0)
Lymphs Abs: 2.4 10*3/uL (ref 0.7–4.0)
MCHC: 33.5 g/dL (ref 30.0–36.0)
MCV: 88 fl (ref 78.0–100.0)
Monocytes Absolute: 0.7 10*3/uL (ref 0.1–1.0)
Monocytes Relative: 8.8 % (ref 3.0–12.0)
Neutro Abs: 4.9 10*3/uL (ref 1.4–7.7)
Neutrophils Relative %: 58.4 % (ref 43.0–77.0)
Platelets: 136 10*3/uL — ABNORMAL LOW (ref 150.0–400.0)
RBC: 4.75 Mil/uL (ref 4.22–5.81)
RDW: 13.9 % (ref 11.5–15.5)
WBC: 8.4 10*3/uL (ref 4.0–10.5)

## 2021-03-30 NOTE — Patient Instructions (Addendum)
Please consider getting a COVID booster  Continue checking your blood pressure BP GOAL is between 110/65 and  135/85. If it is consistently higher or lower, let me know   GO TO THE LAB : Get the blood work     [place], [place] Come back for physical exam in 6 months

## 2021-03-30 NOTE — Progress Notes (Signed)
Subjective:    Patient ID: Dustin Ashley, male    DOB: 07/09/1941, 80 y.o.   MRN: [id number]  DOS:  03/30/2021 Type of visit - description: rov  Since the last office visit is doing well. Has severe shoulder pain, saw sports medicine, feeling much better after local injection. Ambulatory BPs normal.   Review of Systems See above   Past Medical History:  Diagnosis Date   Allergic rhinitis    Allergy    seasonal   Arthritis    Bell palsy    CAD (coronary artery disease)    Cataract    Chronic renal insufficiency, stage II (mild)    Cr ~ 1.4, u/s 4-12 normal kidneys   Gallstone pancreatitis 2015   GERD (gastroesophageal reflux disease)    HOH (hard of hearing)    has a hearing aid L, sees audiology rountinely   Hyperglycemia    A1C 5.8 04-2010   Hyperlipemia    Hypertension    Pain, joint, multiple sites    uses valium, occ uses for insomnia. uses for shoulder  pain   Paroxysmal atrial fibrillation (HCC)    Personal history of colonic polyps    Pulmonary emboli ([place])    02-2014   RAS (renal artery stenosis) ([place]) 05-2012    Renal insufficiency    SCC (squamous cell carcinoma)     Past Surgical History:  Procedure Laterality Date   CARDIAC CATHETERIZATION     CATARACT EXTRACTION     CHOLECYSTECTOMY N/A 02/15/2014   Procedure: LAPAROSCOPIC CHOLECYSTECTOMY with [place];  Surgeon: Gayland Curry, MD;  Location: [place];  Service: General;  Laterality: N/A;   COLONOSCOPY     CORONARY ARTERY BYPASS GRAFT  1995   ESOPHAGOGASTRODUODENOSCOPY N/A 03/02/2014   Procedure: ESOPHAGOGASTRODUODENOSCOPY (EGD);  Surgeon: Ladene Artist, MD;  Location: [facility] ENDOSCOPY;  Service: Endoscopy;  Laterality: N/A;  sarah/leone    Allergies as of 03/30/2021       Reactions   Hydrocodone-acetaminophen Other (See Comments)   REACTION: bladder obstruction   Tramadol Hcl Other (See Comments)   REACTION: bladder obstruction        Medication List        Accurate as of March 30, 2021  11:59 PM. If you have any questions, ask your nurse or doctor.          STOP taking these medications    HYDROcodone-acetaminophen 5-325 MG tablet Commonly known as: NORCO/VICODIN Stopped by: [name], MD       TAKE these medications    amLODipine 10 MG tablet Commonly known as: NORVASC TAKE 1 TABLET BY MOUTH EVERY DAY   aspirin 81 MG tablet Take 81 mg by mouth daily.   atenolol 50 MG tablet Commonly known as: TENORMIN Take 1.5 tablets (75 mg total) by mouth daily.   atorvastatin 20 MG tablet Commonly known as: LIPITOR TAKE 1 TABLET BY MOUTH EVERY DAY   benazepril 20 MG tablet Commonly known as: LOTENSIN TAKE 1 TABLET BY MOUTH EVERY DAY   diazepam 10 MG tablet Commonly known as: VALIUM TAKE 1 TABLET (10 MG TOTAL) BY MOUTH EVERY 12 (TWELVE) HOURS AS NEEDED FOR ANXIETY OR SLEEP.   docusate sodium 100 MG capsule Commonly known as: COLACE Take 100 mg by mouth daily as needed (constipation).   fexofenadine 180 MG tablet Commonly known as: ALLEGRA Take 180 mg by mouth daily.           Objective:   Physical Exam BP (!) 144/70 (BP  Location: Left Arm, Patient Position: Sitting, Cuff Size: Small)   Pulse (!) 55   Temp 97.8 F (36.6 C) (Oral)   Resp 16   Ht '5\' 9"'$  (1.753 m)   Wt 185 lb 8 oz (84.1 kg)   SpO2 97%   BMI 27.39 kg/m  General:   Well developed, NAD, BMI noted. HEENT:  Normocephalic . Face symmetric, atraumatic Lungs:  CTA B Normal respiratory effort, no intercostal retractions, no accessory muscle use. Heart: RRR,  no murmur.  Lower extremities: no pretibial edema bilaterally  Skin: Not pale. Not jaundice Neurologic:  alert & oriented X3.  Speech normal, gait appropriate for age and unassisted Psych--  Cognition and judgment appear intact.  Cooperative with normal attention span and concentration.  Behavior appropriate. No anxious or depressed appearing.      Assessment     Assessment Prediabetes HTN Hyperlipidemia GERD, h/o  Candida esophagitis 02-2014, nexium prn Insomnia, on diazepam MSK --  occ. 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  Ambulatory BPs 120s, 130s.  Check BMP, CBC.  No change. Hyperlipidemia: Well-controlled on Lipitor. Shoulder pain: Since the last visit developed severe pain,  better after a local injection. Vaccine advise: Flu shot today, very hesitant to proceed with COVID booster, d/w pt pro>>cons RTC CPX 6 months  This visit occurred during the SARS-CoV-2 public health emergency.  Safety protocols were in place, including screening questions prior to the visit, additional usage of staff PPE, and extensive cleaning of exam room while observing appropriate contact time as indicated for disinfecting solutions.

## 2021-03-31 NOTE — Assessment & Plan Note (Signed)
HTN: On amlodipine, Tenormin, Lotensin.  Ambulatory BPs 120s, 130s.  Check BMP, CBC.  No change. Hyperlipidemia: Well-controlled on Lipitor. Shoulder pain: Since the last visit developed severe pain,  better after a local injection. Vaccine advise: Flu shot today, very hesitant to proceed with COVID booster, d/w pt pro>>cons RTC CPX 6 months

## 2021-04-11 DIAGNOSIS — M542 Cervicalgia: Secondary | ICD-10-CM | POA: Diagnosis not present

## 2021-04-11 DIAGNOSIS — M9901 Segmental and somatic dysfunction of cervical region: Secondary | ICD-10-CM | POA: Diagnosis not present

## 2021-04-13 DIAGNOSIS — M9901 Segmental and somatic dysfunction of cervical region: Secondary | ICD-10-CM | POA: Diagnosis not present

## 2021-04-13 DIAGNOSIS — M542 Cervicalgia: Secondary | ICD-10-CM | POA: Diagnosis not present

## 2021-04-16 DIAGNOSIS — M542 Cervicalgia: Secondary | ICD-10-CM | POA: Diagnosis not present

## 2021-04-16 DIAGNOSIS — M9901 Segmental and somatic dysfunction of cervical region: Secondary | ICD-10-CM | POA: Diagnosis not present

## 2021-04-17 ENCOUNTER — Ambulatory Visit: Payer: Medicare Other | Admitting: Family Medicine

## 2021-04-17 ENCOUNTER — Other Ambulatory Visit: Payer: Self-pay

## 2021-04-17 ENCOUNTER — Ambulatory Visit (HOSPITAL_BASED_OUTPATIENT_CLINIC_OR_DEPARTMENT_OTHER)
Admission: RE | Admit: 2021-04-17 | Discharge: 2021-04-17 | Disposition: A | Discharge status: Home / Self Care | Payer: Medicare Other | Source: Ambulatory Visit | Attending: Family Medicine | Admitting: Family Medicine

## 2021-04-17 ENCOUNTER — Encounter: Payer: Self-pay | Admitting: Family Medicine

## 2021-04-17 VITALS — Ht 69.0 in | Wt 185.0 lb

## 2021-04-17 DIAGNOSIS — M47812 Spondylosis without myelopathy or radiculopathy, cervical region: Secondary | ICD-10-CM | POA: Diagnosis not present

## 2021-04-17 DIAGNOSIS — M5412 Radiculopathy, cervical region: Secondary | ICD-10-CM | POA: Insufficient documentation

## 2021-04-17 MED ORDER — DICLOFENAC SODIUM 1 % EX GEL: 4.0000 g | Freq: Four times a day (QID) | CUTANEOUS | 11 refills | Status: DC

## 2021-04-17 MED ORDER — METHYLPREDNISOLONE ACETATE 40 MG/ML IJ SUSP
40.0000 mg | Freq: Once | INTRAMUSCULAR | Status: AC
  Administered 2021-04-17: 40 mg via INTRAMUSCULAR

## 2021-04-17 NOTE — Patient Instructions (Signed)
Good to see you Please continue heat  Please use the norco for severe pain.  Please try to rub the voltaren on the area  I will call with the results from today   Please send me a message in [place] with any questions or updates.  Please see me back in 1-2 weeks.   --Dr. Raeford Razor

## 2021-04-17 NOTE — Progress Notes (Signed)
Dustin Ashley - 80 y.o. male MRN [id number]  Date of birth: [birth year]  SUBJECTIVE:  Including CC & ROS.  No chief complaint on file.   Dustin Ashley is a 80 y.o. male that is presenting with acute left-sided neck and trapezius pain.  Having pain that is severe in nature.  Has been taking the previous pain medication that I have prescribed which helps minimally.  Denies any injury or inciting event.  Has been to the chiropractor and seems to have made things a bit worse.   Review of Systems See HPI   HISTORY: Past Medical, Surgical, Social, and Family History Reviewed & Updated per EMR.   Pertinent Historical Findings include:  Past Medical History:  Diagnosis Date   Allergic rhinitis    Allergy    seasonal   Arthritis    Bell palsy    CAD (coronary artery disease)    Cataract    Chronic renal insufficiency, stage II (mild)    Cr ~ 1.4, u/s 4-12 normal kidneys   Gallstone pancreatitis 2015   GERD (gastroesophageal reflux disease)    HOH (hard of hearing)    has a hearing aid L, sees audiology rountinely   Hyperglycemia    A1C 5.8 04-2010   Hyperlipemia    Hypertension    Pain, joint, multiple sites    uses valium, occ uses for insomnia. uses for shoulder  pain   Paroxysmal atrial fibrillation (HCC)    Personal history of colonic polyps    Pulmonary emboli ([place])    02-2014   RAS (renal artery stenosis) ([place]) 05-2012    Renal insufficiency    SCC (squamous cell carcinoma)     Past Surgical History:  Procedure Laterality Date   CARDIAC CATHETERIZATION     CATARACT EXTRACTION     CHOLECYSTECTOMY N/A 02/15/2014   Procedure: LAPAROSCOPIC CHOLECYSTECTOMY with [place];  Surgeon: Gayland Curry, MD;  Location: [place];  Service: General;  Laterality: N/A;   COLONOSCOPY     CORONARY ARTERY BYPASS GRAFT  1995   ESOPHAGOGASTRODUODENOSCOPY N/A 03/02/2014   Procedure: ESOPHAGOGASTRODUODENOSCOPY (EGD);  Surgeon: Ladene Artist, MD;  Location: [facility] ENDOSCOPY;  Service: Endoscopy;   Laterality: N/A;  sarah/leone    Family History  Problem Relation Age of Onset   Hypertension Father    Heart attack Brother        2 brothers, CABG   Diabetes Neg Hx    Colon cancer Neg Hx    Prostate cancer Neg Hx        colon, prostate   Rectal cancer Neg Hx    Stomach cancer Neg Hx     Social History   Socioeconomic History   Marital status: Widowed    Spouse name: Not on file   Number of children: 1   Years of education: Not on file   Highest education level: Not on file  Occupational History   Occupation: retired, delivery truck driver  Tobacco Use   Smoking status: Former    Types: Cigarettes    Quit date: 03/13/1976    Years since quitting: 45.1   Smokeless tobacco: Never  Vaping Use   Vaping Use: Never used  Substance and Sexual Activity   Alcohol use: No    Alcohol/week: 0.0 standard drinks   Drug use: No   Sexual activity: Not on file  Other Topics Concern   Not on file  Social History Narrative   Born in [place], has a large extended family, oldest  of several brothers-sister    Son anf G son live near by    Lost wife 2019. Lives by himself    Social Determinants of Health   Financial Resource Strain: Not on file  Food Insecurity: Not on file  Transportation Needs: Not on file  Physical Activity: Not on file  Stress: Not on file  Social Connections: Not on file  Intimate Partner Violence: Not on file     PHYSICAL EXAM:  VS: Ht 5\' 9"  (1.753 m)   Wt 185 lb (83.9 kg)   BMI 27.32 kg/m  Physical Exam Gen: NAD, alert, cooperative with exam, well-appearing      ASSESSMENT & PLAN:   Cervical radiculopathy Symptoms seem most consistent with radicular type pain.  Does not appear to be associated with his shoulder at this time.  Does have an elevated creatinine and depressed GFR so avoiding oral anti-inflammatories at this time. -Counseled on home exercise therapy and supportive care. -Counseled on Norco. -X-ray. -Voltaren. -IM  Depo-Medrol. -May need to consider gabapentin or physical therapy.

## 2021-04-17 NOTE — Assessment & Plan Note (Signed)
Symptoms seem most consistent with radicular type pain.  Does not appear to be associated with his shoulder at this time.  Does have an elevated creatinine and depressed GFR so avoiding oral anti-inflammatories at this time. -Counseled on home exercise therapy and supportive care. -Counseled on Norco. -X-ray. -Voltaren. -IM Depo-Medrol. -May need to consider gabapentin or physical therapy.

## 2021-04-23 ENCOUNTER — Telehealth: Payer: Self-pay | Admitting: Family Medicine

## 2021-04-23 NOTE — Telephone Encounter (Signed)
Left VM for patient. If he calls back please have him speak with a nurse/CMA and that he has severe degenerative changes of the neck.  Can continue with our discussed therapy.   If any questions then please take the best time and phone number to call and I will try to call him back.   Rosemarie Ax, MD [facility] 04/23/2021, 12:40 PM

## 2021-04-24 ENCOUNTER — Ambulatory Visit: Payer: Medicare Other | Admitting: Family Medicine

## 2021-04-24 VITALS — Ht 69.0 in | Wt 185.0 lb

## 2021-04-24 DIAGNOSIS — M5412 Radiculopathy, cervical region: Secondary | ICD-10-CM

## 2021-04-24 MED ORDER — HYDROCODONE-ACETAMINOPHEN 5-325 MG PO TABS: 1.0000 | ORAL_TABLET | Freq: Three times a day (TID) | ORAL | 0 refills | Status: DC | PRN

## 2021-04-24 NOTE — Progress Notes (Signed)
Dustin Ashley - 80 y.o. male MRN [id number]  Date of birth: 05/24/1941  SUBJECTIVE:  Including CC & ROS.  No chief complaint on file.   Dustin Ashley is a 80 y.o. male that is following up for his radicular pain.  He is about 75% improved.  Still having pain if he is more active.   Review of Systems See HPI   HISTORY: Past Medical, Surgical, Social, and Family History Reviewed & Updated per EMR.   Pertinent Historical Findings include:  Past Medical History:  Diagnosis Date   Allergic rhinitis    Allergy    seasonal   Arthritis    Bell palsy    CAD (coronary artery disease)    Cataract    Chronic renal insufficiency, stage II (mild)    Cr ~ 1.4, u/s 4-12 normal kidneys   Gallstone pancreatitis 2015   GERD (gastroesophageal reflux disease)    HOH (hard of hearing)    has a hearing aid L, sees audiology rountinely   Hyperglycemia    A1C 5.8 04-2010   Hyperlipemia    Hypertension    Pain, joint, multiple sites    uses valium, occ uses for insomnia. uses for shoulder  pain   Paroxysmal atrial fibrillation (HCC)    Personal history of colonic polyps    Pulmonary emboli ([place])    02-2014   RAS (renal artery stenosis) ([place]) 05-2012    Renal insufficiency    SCC (squamous cell carcinoma)     Past Surgical History:  Procedure Laterality Date   CARDIAC CATHETERIZATION     CATARACT EXTRACTION     CHOLECYSTECTOMY N/A 02/15/2014   Procedure: LAPAROSCOPIC CHOLECYSTECTOMY with [place];  Surgeon: Gayland Curry, MD;  Location: [place];  Service: General;  Laterality: N/A;   COLONOSCOPY     CORONARY ARTERY BYPASS GRAFT  1995   ESOPHAGOGASTRODUODENOSCOPY N/A 03/02/2014   Procedure: ESOPHAGOGASTRODUODENOSCOPY (EGD);  Surgeon: Ladene Artist, MD;  Location: [facility] ENDOSCOPY;  Service: Endoscopy;  Laterality: N/A;  sarah/leone    Family History  Problem Relation Age of Onset   Hypertension Father    Heart attack Brother        2 brothers, CABG   Diabetes Neg Hx    Colon cancer Neg Hx     Prostate cancer Neg Hx        colon, prostate   Rectal cancer Neg Hx    Stomach cancer Neg Hx     Social History   Socioeconomic History   Marital status: Widowed    Spouse name: Not on file   Number of children: 1   Years of education: Not on file   Highest education level: Not on file  Occupational History   Occupation: retired, delivery truck driver  Tobacco Use   Smoking status: Former    Types: Cigarettes    Quit date: 03/13/1976    Years since quitting: 45.1   Smokeless tobacco: Never  Vaping Use   Vaping Use: Never used  Substance and Sexual Activity   Alcohol use: No    Alcohol/week: 0.0 standard drinks   Drug use: No   Sexual activity: Not on file  Other Topics Concern   Not on file  Social History Narrative   Born in [place], has a large extended family, oldest of several brothers-sister    Son anf G son live near by    Lost wife 2019. Lives by himself    Social Determinants of Health   Financial  Resource Strain: Not on file  Food Insecurity: Not on file  Transportation Needs: Not on file  Physical Activity: Not on file  Stress: Not on file  Social Connections: Not on file  Intimate Partner Violence: Not on file     PHYSICAL EXAM:  VS: Ht 5\' 9"  (1.753 m)   Wt 185 lb (83.9 kg)   BMI 27.32 kg/m  Physical Exam Gen: NAD, alert, cooperative with exam, well-appearing      ASSESSMENT & PLAN:   Cervical radiculopathy Has gotten improvement but pain still occurring with any activity. -Counseled on home exercise therapy and supportive care. -Referral to physical therapy. -Could consider gabapentin.

## 2021-04-24 NOTE — Patient Instructions (Signed)
Good to see you Please try physical therapy  Please use norco as needed   Please send me a message in MyChart with any questions or updates.  Please see me back in 3-4 weeks.   --Dr. Raeford Razor

## 2021-04-24 NOTE — Assessment & Plan Note (Signed)
Has gotten improvement but pain still occurring with any activity. -Counseled on home exercise therapy and supportive care. -Referral to physical therapy. -Could consider gabapentin.

## 2021-04-25 ENCOUNTER — Ambulatory Visit: Payer: Medicare Other | Attending: Family Medicine

## 2021-04-25 ENCOUNTER — Other Ambulatory Visit: Payer: Self-pay

## 2021-04-25 DIAGNOSIS — M542 Cervicalgia: Secondary | ICD-10-CM | POA: Diagnosis not present

## 2021-04-25 DIAGNOSIS — M5412 Radiculopathy, cervical region: Secondary | ICD-10-CM | POA: Insufficient documentation
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
## 2021-05-02 ENCOUNTER — Ambulatory Visit: Payer: Medicare Other

## 2021-05-02 ENCOUNTER — Other Ambulatory Visit: Payer: Self-pay

## 2021-05-02 DIAGNOSIS — M5412 Radiculopathy, cervical region: Secondary | ICD-10-CM

## 2021-05-02 DIAGNOSIS — M542 Cervicalgia: Secondary | ICD-10-CM

## 2021-05-02 NOTE — Therapy (Signed)
[place], [place], [address] Phone: [phone]   Fax:  [phone]  Physical Therapy Treatment  Patient Details  Name: Dustin Ashley MRN: [id number] Date of Birth: 1941-02-18 Referring Provider (PT): Rosemarie Ax, MD   Encounter Date: 05/02/2021   PT End of Session - 05/02/21 1118     Visit Number 2    Number of Visits 17    Date for PT Re-Evaluation 06/20/21    Authorization Type UHC MCR    Progress Note Due on Visit 10    PT Start Time 1120    PT Stop Time 1158    PT Time Calculation (min) 38 min    Activity Tolerance Patient tolerated treatment well;No increased pain    Behavior During Therapy WFL for tasks assessed/performed             Past Medical History:  Diagnosis Date   Allergic rhinitis    Allergy    seasonal   Arthritis    Bell palsy    CAD (coronary artery disease)    Cataract    Chronic renal insufficiency, stage II (mild)    Cr ~ 1.4, u/s 4-12 normal kidneys   Gallstone pancreatitis 2015   GERD (gastroesophageal reflux disease)    HOH (hard of hearing)    has a hearing aid L, sees audiology rountinely   Hyperglycemia    A1C 5.8 04-2010   Hyperlipemia    Hypertension    Pain, joint, multiple sites    uses valium, occ uses for insomnia. uses for shoulder  pain   Paroxysmal atrial fibrillation (HCC)    Personal history of colonic polyps    Pulmonary emboli ([place])    02-2014   RAS (renal artery stenosis) ([place]) 05-2012    Renal insufficiency    SCC (squamous cell carcinoma)     Past Surgical History:  Procedure Laterality Date   CARDIAC CATHETERIZATION     CATARACT EXTRACTION     CHOLECYSTECTOMY N/A 02/15/2014   Procedure: LAPAROSCOPIC CHOLECYSTECTOMY with [place];  Surgeon: Gayland Curry, MD;  Location: [place];  Service: General;  Laterality: N/A;   COLONOSCOPY     CORONARY ARTERY BYPASS GRAFT  1995   ESOPHAGOGASTRODUODENOSCOPY N/A 03/02/2014   Procedure:  ESOPHAGOGASTRODUODENOSCOPY (EGD);  Surgeon: Ladene Artist, MD;  Location: [facility] ENDOSCOPY;  Service: Endoscopy;  Laterality: N/A;  sarah/leone    There were no vitals filed for this visit.   Subjective Assessment - 05/02/21 1119     Subjective Pt presents to PT with continued reports of neck and L upper trap pain. Notes that he had a "tingling electric" sensation into his L upper trap, denies symptoms past L AC joint. Has been compliant with HEP with no adverse effect. Ready to begin PT at this time.    Pain Score 2     Pain Location Neck    Pain Orientation Left           [facility] Adult PT Treatment/Exercise:   Therapeutic Exercise:  UBE lvl 1.0 x 4 min (91fwd2bwd) while taking subjective Row 3x10 17lbs Supine horizontal abd 3x10 red tband Supine chin tuck 2x10 - 5 sec hold  Past Interventions Not Performed Today: Upper trap stretch x 30 sec ea Cervical rot SNAG x 3 ea Seated chin tuck x 5 - 5 sec hold  Manual Therapy:  Suboccipital release Manual cervical traction Positional release to L upper trap  PT Short Term Goals - 04/25/21 1241       PT SHORT TERM GOAL #1   Title Pt will be compliant with initial HEP for improved comfort and carryover    Baseline initial HEP given    Time 3    Period Weeks    Status New    Target Date 05/16/21               PT Long Term Goals - 04/25/21 1242       PT LONG TERM GOAL #1   Title Pt will decrease NDI to no greater than 25% disability as proxy for functional improvement    Baseline 48% disability    Time 8    Period Weeks    Status New    Target Date 06/20/21      PT LONG TERM GOAL #2   Title Pt will self report neck and UE pain no greater than 3/10 at worst for improved comfort and function    Baseline 10/10 at worst    Time 8    Period Weeks    Status New    Target Date 06/20/21      PT LONG TERM GOAL #3   Title Pt will improve bilat cervical rot to no lesss  than 50 deg for improved comfort and function    Baseline see flowsheet    Time 8    Period Weeks    Status New    Target Date 06/20/21                   Plan - 05/02/21 1131     Clinical Impression Statement Pt was able to complete prescribed exercises with no adverse effect or increase in pain. Today we focused on improving DNF endurance and periscapular strength for improving activity tolerance and reducing pain. He responded well to manual therapy interventions, reporting 0/10 pain at end of session. Pt is progressing well and should continue to be seen and progressed as tolerated.    PT Treatment/Interventions ADLs/Self Care Home Management;Electrical Stimulation;Cryotherapy;Moist Heat;Therapeutic activities;Therapeutic exercise;Functional mobility training;Neuromuscular re-education;Patient/family education;Manual techniques;Passive range of motion;Dry needling;Spinal Manipulations;Taping    PT Next Visit Plan progress DNF endurance and periscapular strength; manual to reduce neck and L UT pain    PT Home Exercise Plan Access Code: [email or web address]             Patient will benefit from skilled therapeutic intervention in order to improve the following deficits and impairments:  Decreased activity tolerance, Decreased endurance, Decreased mobility, Decreased range of motion, Decreased strength, Postural dysfunction, Pain  Visit Diagnosis: Cervicalgia  Radiculopathy, cervical region     Problem List Patient Active Problem List   Diagnosis Date Noted   Cervical radiculopathy 04/17/2021   Capsulitis of right shoulder 03/02/2021   SCC (squamous cell carcinoma) 03/15/2020   PCP NOTES >>>>>>>>>>>>>>>>>> 02/26/2016   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) 12/13/2014   Elevated TSH 04/12/2014   Atrial fibrillation ([place]) 03/15/2014   Pulmonary embolism- DVT 02-2014, provoked  02/26/2014   RAS (renal artery stenosis) ([place]) 07/01/2012   Annual physical exam 03/14/2011   Chronic renal  insufficiency, stage II (mild)    Pain, joint, multiple sites    CPK, ABNORMAL 11/27/2009   DRY SKIN 07/25/2008   ERECTILE DYSFUNCTION 03/28/2008   FATIGUE 03/28/2008   BACK PAIN 12/01/2006   HYPERGLYCEMIA 12/01/2006   HYPERLIPIDEMIA 08/07/2006   Essential hypertension 08/07/2006   CORONARY ARTERY DISEASE 08/07/2006   ALLERGIC RHINITIS 08/07/2006  GERD (and candida esophagitis 02-2014) 08/07/2006   History of colonic polyps 02/17/2003    Ward Chatters, PT 05/02/2021, 12:03 PM  [facility] [address] [place], [place], [address] Phone: [phone]   Fax:  [phone]  Name: Dustin Ashley MRN: [id number] Date of Birth: 1941/06/29

## 2021-05-07 ENCOUNTER — Ambulatory Visit: Payer: Medicare Other | Admitting: Physical Therapy

## 2021-05-07 ENCOUNTER — Other Ambulatory Visit: Payer: Self-pay

## 2021-05-07 DIAGNOSIS — M5412 Radiculopathy, cervical region: Secondary | ICD-10-CM | POA: Diagnosis not present

## 2021-05-07 DIAGNOSIS — M542 Cervicalgia: Secondary | ICD-10-CM | POA: Diagnosis not present

## 2021-05-07 NOTE — Therapy (Signed)
[place], [place], [address] Phone: [phone]   Fax:  [phone]  Physical Therapy Treatment  Patient Details  Name: Dustin Ashley MRN: [id number] Date of Birth: Nov 21, 1940 Referring Provider (PT): Rosemarie Ax, MD   Encounter Date: 05/07/2021   PT End of Session - 05/07/21 0937     Visit Number 3    Number of Visits 17    Date for PT Re-Evaluation 06/20/21    Authorization Type UHC MCR    Progress Note Due on Visit 10    PT Start Time 0932    PT Stop Time 1010    PT Time Calculation (min) 38 min             Past Medical History:  Diagnosis Date   Allergic rhinitis    Allergy    seasonal   Arthritis    Bell palsy    CAD (coronary artery disease)    Cataract    Chronic renal insufficiency, stage II (mild)    Cr ~ 1.4, u/s 4-12 normal kidneys   Gallstone pancreatitis 2015   GERD (gastroesophageal reflux disease)    HOH (hard of hearing)    has a hearing aid L, sees audiology rountinely   Hyperglycemia    A1C 5.8 04-2010   Hyperlipemia    Hypertension    Pain, joint, multiple sites    uses valium, occ uses for insomnia. uses for shoulder  pain   Paroxysmal atrial fibrillation (HCC)    Personal history of colonic polyps    Pulmonary emboli ([place])    02-2014   RAS (renal artery stenosis) ([place]) 05-2012    Renal insufficiency    SCC (squamous cell carcinoma)     Past Surgical History:  Procedure Laterality Date   CARDIAC CATHETERIZATION     CATARACT EXTRACTION     CHOLECYSTECTOMY N/A 02/15/2014   Procedure: LAPAROSCOPIC CHOLECYSTECTOMY with [place];  Surgeon: Gayland Curry, MD;  Location: [place];  Service: General;  Laterality: N/A;   COLONOSCOPY     CORONARY ARTERY BYPASS GRAFT  1995   ESOPHAGOGASTRODUODENOSCOPY N/A 03/02/2014   Procedure: ESOPHAGOGASTRODUODENOSCOPY (EGD);  Surgeon: Ladene Artist, MD;  Location: [facility] ENDOSCOPY;  Service: Endoscopy;  Laterality: N/A;  sarah/leone     There were no vitals filed for this visit.   Subjective Assessment - 05/07/21 0935     Subjective A little tingling in left upper trap/neck are but much better after last session.    Pertinent History chronic hx of cervical pain with recent referral into bilat upper traps L>R    Currently in Pain? Yes    Pain Score 2     Pain Location Neck    Pain Orientation Left    Pain Descriptors / Indicators Tingling;Throbbing    Pain Type Chronic pain    Aggravating Factors  drive a long way , any activity    Pain Relieving Factors heat              [facility] Adult PT Treatment/Exercise:   Therapeutic Exercise:  UBE lvl 1.0 x 4 min (59fwd2bwd) while taking subjective Row 1x10 17lbs single arm, 1 x 10 bilat 10# each) - cues for form and eccentric control  Supine horizontal abd 3x10 green tband Supine red band diagonals x 15 each, Bilateral ER red band  Supine chin tuck 3x10 - 5 sec hold   Past Interventions Not Performed Today: Upper trap stretch x 30 sec ea Cervical rot  SNAG x 3 ea Seated chin tuck x 5 - 5 sec hold   Manual Therapy:  Suboccipital release (not today)  Manual cervical traction Positional release to L upper trap          PT Short Term Goals - 04/25/21 1241       PT SHORT TERM GOAL #1   Title Pt will be compliant with initial HEP for improved comfort and carryover    Baseline initial HEP given    Time 3    Period Weeks    Status New    Target Date 05/16/21               PT Long Term Goals - 04/25/21 1242       PT LONG TERM GOAL #1   Title Pt will decrease NDI to no greater than 25% disability as proxy for functional improvement    Baseline 48% disability    Time 8    Period Weeks    Status New    Target Date 06/20/21      PT LONG TERM GOAL #2   Title Pt will self report neck and UE pain no greater than 3/10 at worst for improved comfort and function    Baseline 10/10 at worst    Time 8    Period Weeks    Status New    Target Date  06/20/21      PT LONG TERM GOAL #3   Title Pt will improve bilat cervical rot to no lesss than 50 deg for improved comfort and function    Baseline see flowsheet    Time 8    Period Weeks    Status New    Target Date 06/20/21                   Plan - 05/07/21 1129     Clinical Impression Statement Mr. Kraai reports steady overall improvement in pain in neck and left upper trap. Continued with previous therex and manual therapy. He reports most improvement following manual.    PT Treatment/Interventions ADLs/Self Care Home Management;Electrical Stimulation;Cryotherapy;Moist Heat;Therapeutic activities;Therapeutic exercise;Functional mobility training;Neuromuscular re-education;Patient/family education;Manual techniques;Passive range of motion;Dry needling;Spinal Manipulations;Taping    PT Next Visit Plan progress DNF endurance and periscapular strength; manual to reduce neck and L UT pain    PT Home Exercise Plan Access Code: [email or web address]             Patient will benefit from skilled therapeutic intervention in order to improve the following deficits and impairments:  Decreased activity tolerance, Decreased endurance, Decreased mobility, Decreased range of motion, Decreased strength, Postural dysfunction, Pain  Visit Diagnosis: Cervicalgia  Radiculopathy, cervical region     Problem List Patient Active Problem List   Diagnosis Date Noted   Cervical radiculopathy 04/17/2021   Capsulitis of right shoulder 03/02/2021   SCC (squamous cell carcinoma) 03/15/2020   PCP NOTES >>>>>>>>>>>>>>>>>> 02/26/2016   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) 12/13/2014   Elevated TSH 04/12/2014   Atrial fibrillation ([place]) 03/15/2014   Pulmonary embolism- DVT 02-2014, provoked  02/26/2014   RAS (renal artery stenosis) ([place]) 07/01/2012   Annual physical exam 03/14/2011   Chronic renal insufficiency, stage II (mild)    Pain, joint, multiple sites    CPK, ABNORMAL 11/27/2009   DRY SKIN 07/25/2008    ERECTILE DYSFUNCTION 03/28/2008   FATIGUE 03/28/2008   BACK PAIN 12/01/2006   HYPERGLYCEMIA 12/01/2006   HYPERLIPIDEMIA 08/07/2006   Essential hypertension 08/07/2006   CORONARY ARTERY DISEASE  08/07/2006   ALLERGIC RHINITIS 08/07/2006   GERD (and candida esophagitis 02-2014) 08/07/2006   History of colonic polyps 02/17/2003    Dorene Ar, PTA 05/07/2021, 11:30 AM  [place], [place], [address] Phone: [phone]   Fax:  [phone]  Name: LYELL CLUGSTON MRN: [id number] Date of Birth: 11-17-1940

## 2021-05-09 ENCOUNTER — Ambulatory Visit: Payer: Medicare Other

## 2021-05-09 ENCOUNTER — Other Ambulatory Visit: Payer: Self-pay

## 2021-05-09 DIAGNOSIS — M5412 Radiculopathy, cervical region: Secondary | ICD-10-CM | POA: Diagnosis not present

## 2021-05-09 DIAGNOSIS — M542 Cervicalgia: Secondary | ICD-10-CM | POA: Diagnosis not present

## 2021-05-09 NOTE — Therapy (Signed)
[place], [place], [address] Phone: [phone]   Fax:  [phone]  Physical Therapy Treatment  Patient Details  Name: Dustin Ashley MRN: [id number] Date of Birth: [birth year] Referring Provider (PT): Rosemarie Ax, MD   Encounter Date: 05/09/2021   PT End of Session - 05/09/21 1039     Visit Number 4    Number of Visits 17    Date for PT Re-Evaluation 06/20/21    Authorization Type UHC MCR    Progress Note Due on Visit 10    PT Start Time 7322    PT Stop Time 1121    PT Time Calculation (min) 40 min    Activity Tolerance Patient tolerated treatment well;No increased pain    Behavior During Therapy WFL for tasks assessed/performed             Past Medical History:  Diagnosis Date   Allergic rhinitis    Allergy    seasonal   Arthritis    Bell palsy    CAD (coronary artery disease)    Cataract    Chronic renal insufficiency, stage II (mild)    Cr ~ 1.4, u/s 4-12 normal kidneys   Gallstone pancreatitis 2015   GERD (gastroesophageal reflux disease)    HOH (hard of hearing)    has a hearing aid L, sees audiology rountinely   Hyperglycemia    A1C 5.8 04-2010   Hyperlipemia    Hypertension    Pain, joint, multiple sites    uses valium, occ uses for insomnia. uses for shoulder  pain   Paroxysmal atrial fibrillation (HCC)    Personal history of colonic polyps    Pulmonary emboli ([place])    02-2014   RAS (renal artery stenosis) ([place]) 05-2012    Renal insufficiency    SCC (squamous cell carcinoma)     Past Surgical History:  Procedure Laterality Date   CARDIAC CATHETERIZATION     CATARACT EXTRACTION     CHOLECYSTECTOMY N/A 02/15/2014   Procedure: LAPAROSCOPIC CHOLECYSTECTOMY with [place];  Surgeon: Gayland Curry, MD;  Location: [place];  Service: General;  Laterality: N/A;   COLONOSCOPY     CORONARY ARTERY BYPASS GRAFT  1995   ESOPHAGOGASTRODUODENOSCOPY N/A 03/02/2014   Procedure:  ESOPHAGOGASTRODUODENOSCOPY (EGD);  Surgeon: Ladene Artist, MD;  Location: [facility] ENDOSCOPY;  Service: Endoscopy;  Laterality: N/A;  sarah/leone    There were no vitals filed for this visit.   Subjective Assessment - 05/09/21 1039     Subjective Pt presents to PT with continued reports of improved symptoms. He has been compliant with HEP with no adverse effect. Pt is ready to begin PT treatment at this time.    Currently in Pain? Yes    Pain Score 2     Pain Location Neck    Pain Orientation Left           [facility] Adult PT Treatment/Exercise:   Therapeutic Exercise:  UBE lvl 1.0 x 4 min (88fwd2bwd) while taking subjective Total gym row 2x10 25lbs Lat pulldown 2x10 25lbs Row 2x12 20lbs Standing ext w/ chin tuck 2x10 20lbs Standing chin tuck against ball 2x10 - 5 sec hold Supine horizontal abd 3x10 green tband Supine chin tuck 3x10 - 5 sec hold   Past Interventions Not Performed Today: Upper trap stretch x 30 sec ea Cervical rot SNAG x 3 ea Seated chin tuck x 5 - 5 sec hold Row 1x10 17lbs single arm, 1 x 10  bilat 10# each) - cues for form and eccentric control  Supine red band diagonals x 15 each, Bilateral ER red band    Manual Therapy:  Suboccipital release Manual cervical traction Positional release to L upper trap                               PT Short Term Goals - 04/25/21 1241       PT SHORT TERM GOAL #1   Title Pt will be compliant with initial HEP for improved comfort and carryover    Baseline initial HEP given    Time 3    Period Weeks    Status New    Target Date 05/16/21               PT Long Term Goals - 04/25/21 1242       PT LONG TERM GOAL #1   Title Pt will decrease NDI to no greater than 25% disability as proxy for functional improvement    Baseline 48% disability    Time 8    Period Weeks    Status New    Target Date 06/20/21      PT LONG TERM GOAL #2   Title Pt will self report neck and UE pain no greater than  3/10 at worst for improved comfort and function    Baseline 10/10 at worst    Time 8    Period Weeks    Status New    Target Date 06/20/21      PT LONG TERM GOAL #3   Title Pt will improve bilat cervical rot to no lesss than 50 deg for improved comfort and function    Baseline see flowsheet    Time 8    Period Weeks    Status New    Target Date 06/20/21                   Plan - 05/09/21 1122     Clinical Impression Statement Pt was able to complete all prescribed exercises with no adverse effect. Noted 0/10 pain post manual therpay interventions and is progressing very well through therapy thus far. Will continue to progress as tolerated per POC.    PT Treatment/Interventions ADLs/Self Care Home Management;Electrical Stimulation;Cryotherapy;Moist Heat;Therapeutic activities;Therapeutic exercise;Functional mobility training;Neuromuscular re-education;Patient/family education;Manual techniques;Passive range of motion;Dry needling;Spinal Manipulations;Taping    PT Next Visit Plan progress DNF endurance and periscapular strength; manual to reduce neck and L UT pain    PT Home Exercise Plan Access Code: [email or web address]             Patient will benefit from skilled therapeutic intervention in order to improve the following deficits and impairments:  Decreased activity tolerance, Decreased endurance, Decreased mobility, Decreased range of motion, Decreased strength, Postural dysfunction, Pain  Visit Diagnosis: Cervicalgia  Radiculopathy, cervical region     Problem List Patient Active Problem List   Diagnosis Date Noted   Cervical radiculopathy 04/17/2021   Capsulitis of right shoulder 03/02/2021   SCC (squamous cell carcinoma) 03/15/2020   PCP NOTES >>>>>>>>>>>>>>>>>> 02/26/2016   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) 12/13/2014   Elevated TSH 04/12/2014   Atrial fibrillation ([place]) 03/15/2014   Pulmonary embolism- DVT 02-2014, provoked  02/26/2014   RAS (renal artery stenosis)  ([place]) 07/01/2012   Annual physical exam 03/14/2011   Chronic renal insufficiency, stage II (mild)    Pain, joint, multiple sites    CPK, ABNORMAL 11/27/2009  DRY SKIN 07/25/2008   ERECTILE DYSFUNCTION 03/28/2008   FATIGUE 03/28/2008   BACK PAIN 12/01/2006   HYPERGLYCEMIA 12/01/2006   HYPERLIPIDEMIA 08/07/2006   Essential hypertension 08/07/2006   CORONARY ARTERY DISEASE 08/07/2006   ALLERGIC RHINITIS 08/07/2006   GERD (and candida esophagitis 02-2014) 08/07/2006   History of colonic polyps 02/17/2003    Ward Chatters, PT 05/09/2021, 11:24 AM  [facility] [address] [place], [place], [address] Phone: [phone]   Fax:  [phone]  Name: Dustin Ashley MRN: [id number] Date of Birth: [birth year]

## 2021-05-14 ENCOUNTER — Encounter: Payer: Medicare Other | Admitting: Physical Therapy

## 2021-05-15 ENCOUNTER — Encounter: Payer: Self-pay | Admitting: Family Medicine

## 2021-05-15 ENCOUNTER — Ambulatory Visit: Payer: Medicare Other | Admitting: Family Medicine

## 2021-05-15 DIAGNOSIS — M5412 Radiculopathy, cervical region: Secondary | ICD-10-CM

## 2021-05-15 NOTE — Assessment & Plan Note (Signed)
Continues to get improvement with physical therapy.  Pain is essentially gone and only has minor nerve irritation from time to time. -Counseled on home exercise therapy and supportive care. -Continue physical therapy. -Could consider further imaging if needed.

## 2021-05-15 NOTE — Progress Notes (Signed)
Dustin Ashley - 80 y.o. male MRN [id number]  Date of birth: 1940-07-27  SUBJECTIVE:  Including CC & ROS.  No chief complaint on file.   Dustin Ashley is a 80 y.o. male that is following up for his radicular pain.  He has been doing well with physical therapy and reports his pain significantly improved.   Review of Systems See HPI   HISTORY: Past Medical, Surgical, Social, and Family History Reviewed & Updated per EMR.   Pertinent Historical Findings include:  Past Medical History:  Diagnosis Date   Allergic rhinitis    Allergy    seasonal   Arthritis    Bell palsy    CAD (coronary artery disease)    Cataract    Chronic renal insufficiency, stage II (mild)    Cr ~ 1.4, u/s 4-12 normal kidneys   Gallstone pancreatitis 2015   GERD (gastroesophageal reflux disease)    HOH (hard of hearing)    has a hearing aid L, sees audiology rountinely   Hyperglycemia    A1C 5.8 04-2010   Hyperlipemia    Hypertension    Pain, joint, multiple sites    uses valium, occ uses for insomnia. uses for shoulder  pain   Paroxysmal atrial fibrillation (HCC)    Personal history of colonic polyps    Pulmonary emboli ([place])    02-2014   RAS (renal artery stenosis) ([place]) 05-2012    Renal insufficiency    SCC (squamous cell carcinoma)     Past Surgical History:  Procedure Laterality Date   CARDIAC CATHETERIZATION     CATARACT EXTRACTION     CHOLECYSTECTOMY N/A 02/15/2014   Procedure: LAPAROSCOPIC CHOLECYSTECTOMY with [place];  Surgeon: Gayland Curry, MD;  Location: [place];  Service: General;  Laterality: N/A;   COLONOSCOPY     CORONARY ARTERY BYPASS GRAFT  1995   ESOPHAGOGASTRODUODENOSCOPY N/A 03/02/2014   Procedure: ESOPHAGOGASTRODUODENOSCOPY (EGD);  Surgeon: Ladene Artist, MD;  Location: [facility] ENDOSCOPY;  Service: Endoscopy;  Laterality: N/A;  sarah/leone    Family History  Problem Relation Age of Onset   Hypertension Father    Heart attack Brother        2 brothers, CABG   Diabetes Neg Hx     Colon cancer Neg Hx    Prostate cancer Neg Hx        colon, prostate   Rectal cancer Neg Hx    Stomach cancer Neg Hx     Social History   Socioeconomic History   Marital status: Widowed    Spouse name: Not on file   Number of children: 1   Years of education: Not on file   Highest education level: Not on file  Occupational History   Occupation: retired, delivery truck driver  Tobacco Use   Smoking status: Former    Types: Cigarettes    Quit date: 03/13/1976    Years since quitting: 45.2   Smokeless tobacco: Never  Vaping Use   Vaping Use: Never used  Substance and Sexual Activity   Alcohol use: No    Alcohol/week: 0.0 standard drinks   Drug use: No   Sexual activity: Not on file  Other Topics Concern   Not on file  Social History Narrative   Born in [place], has a large extended family, oldest of several brothers-sister    Son anf G son live near by    Lost wife 2019. Lives by himself    Social Determinants of Health   Financial  Resource Strain: Not on file  Food Insecurity: Not on file  Transportation Needs: Not on file  Physical Activity: Not on file  Stress: Not on file  Social Connections: Not on file  Intimate Partner Violence: Not on file     PHYSICAL EXAM:  VS: BP (!) 178/76 (BP Location: Left Arm, Patient Position: Sitting)   Ht 5\' 9"  (1.753 m)   Wt 185 lb (83.9 kg)   BMI 27.32 kg/m  Physical Exam Gen: NAD, alert, cooperative with exam, well-appearing      ASSESSMENT & PLAN:   Cervical radiculopathy Continues to get improvement with physical therapy.  Pain is essentially gone and only has minor nerve irritation from time to time. -Counseled on home exercise therapy and supportive care. -Continue physical therapy. -Could consider further imaging if needed.

## 2021-05-16 ENCOUNTER — Ambulatory Visit: Payer: Medicare Other | Attending: Family Medicine

## 2021-05-16 ENCOUNTER — Other Ambulatory Visit: Payer: Self-pay

## 2021-05-16 DIAGNOSIS — M5412 Radiculopathy, cervical region: Secondary | ICD-10-CM | POA: Insufficient documentation

## 2021-05-16 DIAGNOSIS — M542 Cervicalgia: Secondary | ICD-10-CM | POA: Insufficient documentation

## 2021-05-16 NOTE — Therapy (Signed)
[place], [place], [address] Phone: [phone]   Fax:  [phone]  Physical Therapy Treatment  Patient Details  Name: Dustin Ashley MRN: [id number] Date of Birth: 02/27/1941 Referring Provider (PT): Rosemarie Ax, MD   Encounter Date: 05/16/2021   PT End of Session - 05/16/21 1045     Visit Number 5    Number of Visits 17    Date for PT Re-Evaluation 06/20/21    Authorization Type UHC MCR    Progress Note Due on Visit 10    PT Start Time 5284    PT Stop Time 1125    PT Time Calculation (min) 40 min    Activity Tolerance Patient tolerated treatment well;No increased pain    Behavior During Therapy WFL for tasks assessed/performed             Past Medical History:  Diagnosis Date   Allergic rhinitis    Allergy    seasonal   Arthritis    Bell palsy    CAD (coronary artery disease)    Cataract    Chronic renal insufficiency, stage II (mild)    Cr ~ 1.4, u/s 4-12 normal kidneys   Gallstone pancreatitis 2015   GERD (gastroesophageal reflux disease)    HOH (hard of hearing)    has a hearing aid L, sees audiology rountinely   Hyperglycemia    A1C 5.8 04-2010   Hyperlipemia    Hypertension    Pain, joint, multiple sites    uses valium, occ uses for insomnia. uses for shoulder  pain   Paroxysmal atrial fibrillation (HCC)    Personal history of colonic polyps    Pulmonary emboli ([place])    02-2014   RAS (renal artery stenosis) ([place]) 05-2012    Renal insufficiency    SCC (squamous cell carcinoma)     Past Surgical History:  Procedure Laterality Date   CARDIAC CATHETERIZATION     CATARACT EXTRACTION     CHOLECYSTECTOMY N/A 02/15/2014   Procedure: LAPAROSCOPIC CHOLECYSTECTOMY with [place];  Surgeon: Gayland Curry, MD;  Location: [facility];  Service: General;  Laterality: N/A;   COLONOSCOPY     CORONARY ARTERY BYPASS GRAFT  1995   ESOPHAGOGASTRODUODENOSCOPY N/A 03/02/2014   Procedure:  ESOPHAGOGASTRODUODENOSCOPY (EGD);  Surgeon: Ladene Artist, MD;  Location: [facility] ENDOSCOPY;  Service: Endoscopy;  Laterality: N/A;  sarah/leone    There were no vitals filed for this visit.   Subjective Assessment - 05/16/21 1045     Subjective Pt presents to PT with no current reports of pain. He had slight increase in symptoms last Thursday, but this is the only day that was bothering him. He is ready to begin PT treatment at this time.    Currently in Pain? No/denies    Pain Score 0-No pain           [facility] Adult PT Treatment/Exercise:   Therapeutic Exercise:  UBE lvl 1.0 x 4 min (64fwd2bwd) while taking subjective Total gym row 2x10 35lbs Lat pulldown 2x10 25lbs Row 2x12 20lbs Standing ext w/ chin tuck 2x10 20lbs Standing chin tuck against ball 2x10 - 5 sec hold Supine horizontal abd 2x15 green tband Supine chin tuck 3x10 - 5 sec hold   Past Interventions Not Performed Today: Upper trap stretch x 30 sec ea Cervical rot SNAG x 3 ea Seated chin tuck x 5 - 5 sec hold Row 1x10 17lbs single arm, 1 x 10 bilat 10# each) -  cues for form and eccentric control  Supine red band diagonals x 15 each, Bilateral ER red band    Manual Therapy:  Suboccipital release Manual cervical traction Positional release to L upper trap                               PT Short Term Goals - 04/25/21 1241       PT SHORT TERM GOAL #1   Title Pt will be compliant with initial HEP for improved comfort and carryover    Baseline initial HEP given    Time 3    Period Weeks    Status New    Target Date 05/16/21               PT Long Term Goals - 04/25/21 1242       PT LONG TERM GOAL #1   Title Pt will decrease NDI to no greater than 25% disability as proxy for functional improvement    Baseline 48% disability    Time 8    Period Weeks    Status New    Target Date 06/20/21      PT LONG TERM GOAL #2   Title Pt will self report neck and UE pain no greater than  3/10 at worst for improved comfort and function    Baseline 10/10 at worst    Time 8    Period Weeks    Status New    Target Date 06/20/21      PT LONG TERM GOAL #3   Title Pt will improve bilat cervical rot to no lesss than 50 deg for improved comfort and function    Baseline see flowsheet    Time 8    Period Weeks    Status New    Target Date 06/20/21                   Plan - 05/16/21 1055     Clinical Impression Statement Pt was once again able to complete prescribed exercises with no adverse effect or change in baseline. Reported 0/10 pain post manual and states that he is feeling very good. Will continue to progress as tolerated per POC.    PT Treatment/Interventions ADLs/Self Care Home Management;Electrical Stimulation;Cryotherapy;Moist Heat;Therapeutic activities;Therapeutic exercise;Functional mobility training;Neuromuscular re-education;Patient/family education;Manual techniques;Passive range of motion;Dry needling;Spinal Manipulations;Taping    PT Next Visit Plan progress DNF endurance and periscapular strength; manual to reduce neck and L UT pain    PT Home Exercise Plan Access Code: [email or web address]             Patient will benefit from skilled therapeutic intervention in order to improve the following deficits and impairments:  Decreased activity tolerance, Decreased endurance, Decreased mobility, Decreased range of motion, Decreased strength, Postural dysfunction, Pain  Visit Diagnosis: Cervicalgia  Radiculopathy, cervical region     Problem List Patient Active Problem List   Diagnosis Date Noted   Cervical radiculopathy 04/17/2021   Capsulitis of right shoulder 03/02/2021   SCC (squamous cell carcinoma) 03/15/2020   PCP NOTES >>>>>>>>>>>>>>>>>> 02/26/2016   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) 12/13/2014   Elevated TSH 04/12/2014   Atrial fibrillation ([place]) 03/15/2014   Pulmonary embolism- DVT 02-2014, provoked  02/26/2014   RAS (renal artery stenosis) ([place])  07/01/2012   Annual physical exam 03/14/2011   Chronic renal insufficiency, stage II (mild)    Pain, joint, multiple sites    CPK, ABNORMAL 11/27/2009  DRY SKIN 07/25/2008   ERECTILE DYSFUNCTION 03/28/2008   FATIGUE 03/28/2008   BACK PAIN 12/01/2006   HYPERGLYCEMIA 12/01/2006   HYPERLIPIDEMIA 08/07/2006   Essential hypertension 08/07/2006   CORONARY ARTERY DISEASE 08/07/2006   ALLERGIC RHINITIS 08/07/2006   GERD (and candida esophagitis 02-2014) 08/07/2006   History of colonic polyps 02/17/2003    Ward Chatters, PT 05/16/2021, 11:28 AM  [facility] [address] [place], [place], [address] Phone: [phone]   Fax:  [phone]  Name: Dustin Ashley MRN: [id number] Date of Birth: 12-Aug-1940

## 2021-05-21 ENCOUNTER — Other Ambulatory Visit: Payer: Self-pay

## 2021-05-21 ENCOUNTER — Ambulatory Visit: Payer: Medicare Other

## 2021-05-21 DIAGNOSIS — M5412 Radiculopathy, cervical region: Secondary | ICD-10-CM

## 2021-05-21 DIAGNOSIS — M542 Cervicalgia: Secondary | ICD-10-CM | POA: Diagnosis not present

## 2021-05-21 NOTE — Therapy (Signed)
[place], [place], [address] Phone: [phone]   Fax:  [phone]  Physical Therapy Treatment  Patient Details  Name: Dustin Ashley MRN: [id number] Date of Birth: 08-Oct-1940 Referring Provider (PT): Rosemarie Ax, MD   Encounter Date: 05/21/2021   PT End of Session - 05/21/21 1001     Visit Number 6    Number of Visits 17    Date for PT Re-Evaluation 06/20/21    Authorization Type UHC MCR    Progress Note Due on Visit 10    PT Start Time 1001    PT Stop Time 1041    PT Time Calculation (min) 40 min    Activity Tolerance Patient tolerated treatment well;No increased pain    Behavior During Therapy WFL for tasks assessed/performed             Past Medical History:  Diagnosis Date   Allergic rhinitis    Allergy    seasonal   Arthritis    Bell palsy    CAD (coronary artery disease)    Cataract    Chronic renal insufficiency, stage II (mild)    Cr ~ 1.4, u/s 4-12 normal kidneys   Gallstone pancreatitis 2015   GERD (gastroesophageal reflux disease)    HOH (hard of hearing)    has a hearing aid L, sees audiology rountinely   Hyperglycemia    A1C 5.8 04-2010   Hyperlipemia    Hypertension    Pain, joint, multiple sites    uses valium, occ uses for insomnia. uses for shoulder  pain   Paroxysmal atrial fibrillation (HCC)    Personal history of colonic polyps    Pulmonary emboli ([place])    02-2014   RAS (renal artery stenosis) ([place]) 05-2012    Renal insufficiency    SCC (squamous cell carcinoma)     Past Surgical History:  Procedure Laterality Date   CARDIAC CATHETERIZATION     CATARACT EXTRACTION     CHOLECYSTECTOMY N/A 02/15/2014   Procedure: LAPAROSCOPIC CHOLECYSTECTOMY with [place];  Surgeon: Gayland Curry, MD;  Location: [facility];  Service: General;  Laterality: N/A;   COLONOSCOPY     CORONARY ARTERY BYPASS GRAFT  1995   ESOPHAGOGASTRODUODENOSCOPY N/A 03/02/2014   Procedure:  ESOPHAGOGASTRODUODENOSCOPY (EGD);  Surgeon: Ladene Artist, MD;  Location: [facility] ENDOSCOPY;  Service: Endoscopy;  Laterality: N/A;  sarah/leone    There were no vitals filed for this visit.   Subjective Assessment - 05/21/21 1001     Subjective Pt presents to PT with no current pain, does note some nerve referral into L upper trap. Had some muscle soreness after last session. Continues HEP compliance, no adverse effects. He is ready to begin PT at this time.    Currently in Pain? No/denies    Pain Score 0-No pain           [facility] Adult PT Treatment/Exercise:   Therapeutic Exercise:  UBE lvl 1.5 x 4 min (94fwd2bwd) while taking subjective Total gym row 3x10 35lbs Lat pulldown 2x10 25lbs Row 3x12 20lbs Standing ext w/ chin tuck 2x10 20lbs Standing chin tuck against ball 2x10 - 5 sec hold Supine horizontal abd 2x15 red tband Supine chin tuck 3x10 - 5 sec hold   Past Interventions Not Performed Today: Upper trap stretch x 30 sec ea Cervical rot SNAG x 3 ea Seated chin tuck x 5 - 5 sec hold Row 1x10 17lbs single arm, 1 x 10 bilat 10# each) -  cues for form and eccentric control  Supine red band diagonals x 15 each, Bilateral ER red band    Manual Therapy:  Suboccipital release Manual cervical traction Positional release to L upper trap                               PT Short Term Goals - 04/25/21 1241       PT SHORT TERM GOAL #1   Title Pt will be compliant with initial HEP for improved comfort and carryover    Baseline initial HEP given    Time 3    Period Weeks    Status New    Target Date 05/16/21               PT Long Term Goals - 04/25/21 1242       PT LONG TERM GOAL #1   Title Pt will decrease NDI to no greater than 25% disability as proxy for functional improvement    Baseline 48% disability    Time 8    Period Weeks    Status New    Target Date 06/20/21      PT LONG TERM GOAL #2   Title Pt will self report neck and UE pain  no greater than 3/10 at worst for improved comfort and function    Baseline 10/10 at worst    Time 8    Period Weeks    Status New    Target Date 06/20/21      PT LONG TERM GOAL #3   Title Pt will improve bilat cervical rot to no lesss than 50 deg for improved comfort and function    Baseline see flowsheet    Time 8    Period Weeks    Status New    Target Date 06/20/21                   Plan - 05/21/21 1024     Clinical Impression Statement Pt was able to complete prescribed exercises with no adverse effect. Today's session we focused on increasing DNF endurance and periscapular strength for decreasing pain and improving function. He continues to respond well to manual therapy interventions, noting improved comfort post session. Will assess goals and possible d/c at next session.    PT Treatment/Interventions ADLs/Self Care Home Management;Electrical Stimulation;Cryotherapy;Moist Heat;Therapeutic activities;Therapeutic exercise;Functional mobility training;Neuromuscular re-education;Patient/family education;Manual techniques;Passive range of motion;Dry needling;Spinal Manipulations;Taping    PT Next Visit Plan progress DNF endurance and periscapular strength; manual to reduce neck and L UT pain    PT Home Exercise Plan Access Code: [email or web address]             Patient will benefit from skilled therapeutic intervention in order to improve the following deficits and impairments:  Decreased activity tolerance, Decreased endurance, Decreased mobility, Decreased range of motion, Decreased strength, Postural dysfunction, Pain  Visit Diagnosis: Cervicalgia  Radiculopathy, cervical region     Problem List Patient Active Problem List   Diagnosis Date Noted   Cervical radiculopathy 04/17/2021   Capsulitis of right shoulder 03/02/2021   SCC (squamous cell carcinoma) 03/15/2020   PCP NOTES >>>>>>>>>>>>>>>>>> 02/26/2016   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) 12/13/2014   Elevated TSH  04/12/2014   Atrial fibrillation ([place]) 03/15/2014   Pulmonary embolism- DVT 02-2014, provoked  02/26/2014   RAS (renal artery stenosis) ([place]) 07/01/2012   Annual physical exam 03/14/2011   Chronic renal insufficiency, stage II (mild)  Pain, joint, multiple sites    CPK, ABNORMAL 11/27/2009   DRY SKIN 07/25/2008   ERECTILE DYSFUNCTION 03/28/2008   FATIGUE 03/28/2008   BACK PAIN 12/01/2006   HYPERGLYCEMIA 12/01/2006   HYPERLIPIDEMIA 08/07/2006   Essential hypertension 08/07/2006   CORONARY ARTERY DISEASE 08/07/2006   ALLERGIC RHINITIS 08/07/2006   GERD (and candida esophagitis 02-2014) 08/07/2006   History of colonic polyps 02/17/2003    Ward Chatters, PT 05/21/2021, 10:42 AM  [facility] [address] [place], [place], [address] Phone: [phone]   Fax:  [phone]  Name: Dustin Ashley MRN: [id number] Date of Birth: 21-Oct-1940

## 2021-05-23 ENCOUNTER — Ambulatory Visit: Payer: Medicare Other

## 2021-05-23 ENCOUNTER — Other Ambulatory Visit: Payer: Self-pay

## 2021-05-23 DIAGNOSIS — M542 Cervicalgia: Secondary | ICD-10-CM | POA: Diagnosis not present

## 2021-05-23 DIAGNOSIS — M5412 Radiculopathy, cervical region: Secondary | ICD-10-CM

## 2021-05-23 NOTE — Therapy (Signed)
[place], [place], [address] Phone: [phone]   Fax:  [phone]  Physical Therapy Treatment/Discharge  Patient Details  Name: Dustin Ashley MRN: [id number] Date of Birth: 10/12/1940 Referring Provider (PT): Rosemarie Ax, MD   Encounter Date: 05/23/2021   PT End of Session - 05/23/21 1000     Visit Number 7    Number of Visits 17    Date for PT Re-Evaluation 06/20/21    Authorization Type UHC MCR    Progress Note Due on Visit 10    PT Start Time 1000    PT Stop Time 1035    PT Time Calculation (min) 35 min    Activity Tolerance Patient tolerated treatment well;No increased pain    Behavior During Therapy WFL for tasks assessed/performed             Past Medical History:  Diagnosis Date   Allergic rhinitis    Allergy    seasonal   Arthritis    Bell palsy    CAD (coronary artery disease)    Cataract    Chronic renal insufficiency, stage II (mild)    Cr ~ 1.4, u/s 4-12 normal kidneys   Gallstone pancreatitis 2015   GERD (gastroesophageal reflux disease)    HOH (hard of hearing)    has a hearing aid L, sees audiology rountinely   Hyperglycemia    A1C 5.8 04-2010   Hyperlipemia    Hypertension    Pain, joint, multiple sites    uses valium, occ uses for insomnia. uses for shoulder  pain   Paroxysmal atrial fibrillation (HCC)    Personal history of colonic polyps    Pulmonary emboli ([place])    02-2014   RAS (renal artery stenosis) ([place]) 05-2012    Renal insufficiency    SCC (squamous cell carcinoma)     Past Surgical History:  Procedure Laterality Date   CARDIAC CATHETERIZATION     CATARACT EXTRACTION     CHOLECYSTECTOMY N/A 02/15/2014   Procedure: LAPAROSCOPIC CHOLECYSTECTOMY with [place];  Surgeon: Gayland Curry, MD;  Location: [facility];  Service: General;  Laterality: N/A;   COLONOSCOPY     CORONARY ARTERY BYPASS GRAFT  1995   ESOPHAGOGASTRODUODENOSCOPY N/A 03/02/2014    Procedure: ESOPHAGOGASTRODUODENOSCOPY (EGD);  Surgeon: Ladene Artist, MD;  Location: [facility] ENDOSCOPY;  Service: Endoscopy;  Laterality: N/A;  sarah/leone    There were no vitals filed for this visit.   Subjective Assessment - 05/23/21 1000     Subjective Pt presents to PT with no current reports of pain. Has been compliant with his HEP with no adverse effect. He is ready to begin PT treatment at this time.    Currently in Pain? No/denies    Pain Score 0-No pain           [facility] Adult PT Treatment/Exercise:   Therapeutic Exercise:  UBE lvl 1.5 x 4 min (5fd2bwd) while taking subjective Row 3x12 25lbs Standing ext w/ chin tuck 2x10 25lbs Supine horizontal abd 2x15 green tband Supine chin tuck 3x10 - 5 sec hold Upper trap stretch x 30 sec ea Cervical rot SNAG x 5 ea   Past Interventions Not Performed Today: Seated chin tuck x 5 - 5 sec hold Row 1x10 17lbs single arm, 1 x 10 bilat 10# each) - cues for form and eccentric control  Supine red band diagonals x 15 each, Bilateral ER red band  Total gym row 3x10 35lbs Lat pulldown 2x10  25lbs Standing chin tuck against ball 2x10 - 5 sec hold    [facility] PT Assessment - 05/23/21 0001       Observation/Other Assessments   Other Surveys  Neck Disability Index    Neck Disability Index  0% disability      AROM   Cervical - Right Rotation 52    Cervical - Left Rotation 40                                    PT Education - 05/23/21 1036     Education Details HEP update, discharge planning    Person(s) Educated Patient    Methods Explanation;Demonstration;Handout    Comprehension Verbalized understanding;Returned demonstration              PT Short Term Goals - 05/23/21 1003       PT SHORT TERM GOAL #1   Title Pt will be compliant with initial HEP for improved comfort and carryover    Baseline initial HEP given    Time 3    Period Weeks    Status Achieved    Target Date 05/16/21                PT Long Term Goals - 05/23/21 1027       PT LONG TERM GOAL #1   Title Pt will decrease NDI to no greater than 25% disability as proxy for functional improvement    Baseline 48% disability; 0% disability on 05/23/21    Time 8    Period Weeks    Status Achieved      PT LONG TERM GOAL #2   Title Pt will self report neck and UE pain no greater than 3/10 at worst for improved comfort and function    Baseline 10/10 at worst - 0/10 pain on 05/23/21    Time 8    Period Weeks    Status Achieved      PT LONG TERM GOAL #3   Title Pt will improve bilat cervical rot to no lesss than 50 deg for improved comfort and function    Baseline see flowsheet    Time 8    Period Weeks    Status Partially Met                   Plan - 05/23/21 1037     Clinical Impression Statement Pt was able to complete prescribed exercises and demonstrated knowledge of HEP with no adverse effect. Over the course of PT treatment, he has improved both his bilateral neck rotation ROM and decreased his NDI disability score to 0%. Likewise, he notes improved functional ability and 0/10 pain over the last few weeks. He feels he is ready to discharge and should continue to improve with HEP compliance. HEP reviewed and pt agreeable to discharge at this time.    PT Treatment/Interventions ADLs/Self Care Home Management;Electrical Stimulation;Cryotherapy;Moist Heat;Therapeutic activities;Therapeutic exercise;Functional mobility training;Neuromuscular re-education;Patient/family education;Manual techniques;Passive range of motion;Dry needling;Spinal Manipulations;Taping    PT Home Exercise Plan Access Code: [email or web address]    [email or web address] and Agree with Plan of Care Patient             Patient will benefit from skilled therapeutic intervention in order to improve the following deficits and impairments:  Decreased activity tolerance, Decreased endurance, Decreased mobility, Decreased range of motion, Decreased strength,  Postural dysfunction, Pain  Visit Diagnosis: Cervicalgia  Radiculopathy, cervical region  Problem List Patient Active Problem List   Diagnosis Date Noted   Cervical radiculopathy 04/17/2021   Capsulitis of right shoulder 03/02/2021   SCC (squamous cell carcinoma) 03/15/2020   PCP NOTES >>>>>>>>>>>>>>>>>> 02/26/2016   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) 12/13/2014   Elevated TSH 04/12/2014   Atrial fibrillation ([place]) 03/15/2014   Pulmonary embolism- DVT 02-2014, provoked  02/26/2014   RAS (renal artery stenosis) ([place]) 07/01/2012   Annual physical exam 03/14/2011   Chronic renal insufficiency, stage II (mild)    Pain, joint, multiple sites    CPK, ABNORMAL 11/27/2009   DRY SKIN 07/25/2008   ERECTILE DYSFUNCTION 03/28/2008   FATIGUE 03/28/2008   BACK PAIN 12/01/2006   HYPERGLYCEMIA 12/01/2006   HYPERLIPIDEMIA 08/07/2006   Essential hypertension 08/07/2006   CORONARY ARTERY DISEASE 08/07/2006   ALLERGIC RHINITIS 08/07/2006   GERD (and candida esophagitis 02-2014) 08/07/2006   History of colonic polyps 02/17/2003    Ward Chatters, PT 05/23/2021, 10:39 AM  [facility] [address] [place], [place], [address] Phone: [phone]   Fax:  [phone]  Name: JOSAIAH MUHAMMED MRN: [id number] Date of Birth: [birth year]   PHYSICAL THERAPY DISCHARGE SUMMARY  Visits from Start of Care: 7  Current functional level related to goals / functional outcomes: See goals and assessment   Remaining deficits: See goals and assessment   Education / Equipment: HEP   Patient agrees to discharge. Patient goals were met. Patient is being discharged due to being pleased with the current functional level.

## 2021-05-29 ENCOUNTER — Other Ambulatory Visit: Payer: Self-pay | Admitting: Internal Medicine

## 2021-07-02 ENCOUNTER — Telehealth: Payer: Self-pay | Admitting: Internal Medicine

## 2021-07-02 NOTE — Telephone Encounter (Signed)
PDMP okay, Rx sent 

## 2021-07-02 NOTE — Telephone Encounter (Signed)
Requesting: diazepam 10mg   Contract: 05/29/2020 UDS: 09/26/2020 Last Visit: 03/30/2021 Next Visit: 10/02/2021 Last Refill: 03/21/2021 #30 and 2RF  Please Advise

## 2021-07-05 DIAGNOSIS — L820 Inflamed seborrheic keratosis: Secondary | ICD-10-CM | POA: Diagnosis not present

## 2021-07-05 DIAGNOSIS — L570 Actinic keratosis: Secondary | ICD-10-CM | POA: Diagnosis not present

## 2021-07-05 DIAGNOSIS — C44329 Squamous cell carcinoma of skin of other parts of face: Secondary | ICD-10-CM | POA: Diagnosis not present

## 2021-07-05 DIAGNOSIS — D485 Neoplasm of uncertain behavior of skin: Secondary | ICD-10-CM | POA: Diagnosis not present

## 2021-09-05 ENCOUNTER — Other Ambulatory Visit: Payer: Self-pay | Admitting: Internal Medicine

## 2021-09-10 DIAGNOSIS — Z08 Encounter for follow-up examination after completed treatment for malignant neoplasm: Secondary | ICD-10-CM | POA: Diagnosis not present

## 2021-09-10 DIAGNOSIS — L570 Actinic keratosis: Secondary | ICD-10-CM | POA: Diagnosis not present

## 2021-09-10 DIAGNOSIS — Z86007 Personal history of in-situ neoplasm of skin: Secondary | ICD-10-CM | POA: Diagnosis not present

## 2021-10-02 ENCOUNTER — Ambulatory Visit (INDEPENDENT_AMBULATORY_CARE_PROVIDER_SITE_OTHER): Payer: Medicare Other | Admitting: Internal Medicine

## 2021-10-02 ENCOUNTER — Encounter: Payer: Self-pay | Admitting: Internal Medicine

## 2021-10-02 VITALS — BP 126/72 | HR 56 | Temp 98.0°F | Resp 16 | Ht 69.0 in | Wt 188.5 lb

## 2021-10-02 DIAGNOSIS — Z0000 Encounter for general adult medical examination without abnormal findings: Secondary | ICD-10-CM | POA: Diagnosis not present

## 2021-10-02 DIAGNOSIS — R7989 Other specified abnormal findings of blood chemistry: Secondary | ICD-10-CM | POA: Diagnosis not present

## 2021-10-02 DIAGNOSIS — Z79899 Other long term (current) drug therapy: Secondary | ICD-10-CM

## 2021-10-02 DIAGNOSIS — I10 Essential (primary) hypertension: Secondary | ICD-10-CM | POA: Diagnosis not present

## 2021-10-02 DIAGNOSIS — G4700 Insomnia, unspecified: Secondary | ICD-10-CM

## 2021-10-02 DIAGNOSIS — R739 Hyperglycemia, unspecified: Secondary | ICD-10-CM | POA: Diagnosis not present

## 2021-10-02 LAB — COMPREHENSIVE METABOLIC PANEL
ALT: 13 U/L (ref 0–53)
AST: 17 U/L (ref 0–37)
Albumin: 4.1 g/dL (ref 3.5–5.2)
Alkaline Phosphatase: 86 U/L (ref 39–117)
BUN: 25 mg/dL — ABNORMAL HIGH (ref 6–23)
CO2: 27 mEq/L (ref 19–32)
Calcium: 9.3 mg/dL (ref 8.4–10.5)
Chloride: 106 mEq/L (ref 96–112)
Creatinine, Ser: 1.44 mg/dL (ref 0.40–1.50)
GFR: 45.76 mL/min — ABNORMAL LOW (ref >60.00)
Glucose, Bld: 96 mg/dL (ref 70–99)
Potassium: 4.3 mEq/L (ref 3.5–5.1)
Sodium: 141 mEq/L (ref 135–145)
Total Bilirubin: 0.7 mg/dL (ref 0.2–1.2)
Total Protein: 6.5 g/dL (ref 6.0–8.3)

## 2021-10-02 LAB — LIPID PANEL
Cholesterol: 126 mg/dL (ref 0–200)
HDL: 36.5 mg/dL — ABNORMAL LOW (ref >39.00)
LDL Cholesterol: 60 mg/dL (ref 0–99)
NonHDL: 89.91
Total CHOL/HDL Ratio: 3
Triglycerides: 148 mg/dL (ref 0.0–149.0)
VLDL: 29.6 mg/dL (ref 0.0–40.0)

## 2021-10-02 LAB — HEMOGLOBIN A1C: Hgb A1c MFr Bld: 5.7 % (ref 4.6–6.5)

## 2021-10-02 LAB — TSH: TSH: 3.08 u[IU]/mL (ref 0.35–5.50)

## 2021-10-02 NOTE — Assessment & Plan Note (Signed)
Here for CPX ?Prediabetes: Has a healthy lifestyle, check A1c ?Insomnia: On diazepam, contract and UDS today ?HTN: BP is very good today, continue amlodipine, Tenormin, Lotensin.  Labs. ?Hyperlipidemia: On Lipitor 20 mg, labs. ?Neck pain: Had exacerbation last year, resolved. ?CAD: No symptoms, continue present care ?Skin cancers: Sees dermatology regularly ?RTC 6 months ? ?

## 2021-10-02 NOTE — Patient Instructions (Signed)
Check the  blood pressure regularly ?BP GOAL is between 110/65 and  135/85. ?If it is consistently higher or lower, let me know ? ?  ? ?GO TO THE LAB : Get the blood work   ? ? ?[place], [place] ?Come back for   a checkup in 6 months ? ? ? ? ? ?"Living will", "Health Care Power of attorney": Advanced care planning ? ?(If you already have a living will or healthcare power of attorney, please bring the copy to be scanned in your chart.) ? ?Advance care planning is a process that supports adults in  understanding and sharing their preferences regarding future medical care.  ? ?The patient's preferences are recorded in documents called Advance Directives.    ?Advanced directives are completed (and can be modified at any time) while the patient is in full mental capacity.  ? ?The documentation should be available at all times to the patient, the family and the healthcare providers.  ?Bring in a copy to be scanned in your chart is an excellent idea and is recommended  ? ?This legal documents direct treatment decision making and/or appoint a surrogate to make the decision if the patient is not capable to do so.  ? ? ?Advance directives can be documented in many types of formats,  documents have names such as:  ?Lliving will  ?Durable power of attorney for healthcare (healthcare proxy or healthcare power of attorney)  ?Combined directives  ?Physician orders for life-sustaining treatment  ?  ?More information at: ? [email or web address]  ?

## 2021-10-02 NOTE — Assessment & Plan Note (Signed)
-  Td 2015 ?- PNM 23 2008, 09/2018; prevnar 2015 ?- shingrex: x2 ?-COVID VAX: X3, declines booster  ?- had a flu shot  ?-CCS, prostate cancer screening: No further, see previous entries ?-Labs:   CMP, FLP, A1c, TSH, UDS ?-Advance directives: Discussed ?-Diet exercise: Doing well, goes to the gym 2- 3 times a week.  Does yard work ? ?  ?

## 2021-10-02 NOTE — Progress Notes (Signed)
? ?Subjective:  ? ? Patient ID: Dustin Ashley, male    DOB: 06/02/1941, 81 y.o.   MRN: [id number] ? ?DOS:  10/02/2021 ?Type of visit - description: CPX ? ?Here for CPX ?No major concerns. ?Last year had a problem with neck pain, saw sports medicine, did physical therapy, now exercises at the gym regularly. ?Pain resolved. ?Denies chest pain or difficulty breathing. ? ? ?Review of Systems ? ?Other than above, a 14 point review of systems is negative  ? ?  ? ? ?Past Medical History:  ?Diagnosis Date  ? Allergic rhinitis   ? Allergy   ? seasonal  ? Arthritis   ? Bell palsy   ? CAD (coronary artery disease)   ? Cataract   ? Chronic renal insufficiency, stage II (mild)   ? Cr ~ 1.4, u/s 4-12 normal kidneys  ? Gallstone pancreatitis 2015  ? GERD (gastroesophageal reflux disease)   ? HOH (hard of hearing)   ? has a hearing aid L, sees audiology rountinely  ? Hyperglycemia   ? A1C 5.8 04-2010  ? Hyperlipemia   ? Hypertension   ? Pain, joint, multiple sites   ? uses valium, occ uses for insomnia. uses for shoulder  pain  ? Paroxysmal atrial fibrillation (HCC)   ? Personal history of colonic polyps   ? Pulmonary emboli ([place])   ? 02-2014  ? RAS (renal artery stenosis) ([place]) 05-2012   ? Renal insufficiency   ? SCC (squamous cell carcinoma)   ? ? ?Past Surgical History:  ?Procedure Laterality Date  ? CARDIAC CATHETERIZATION    ? CATARACT EXTRACTION    ? CHOLECYSTECTOMY N/A 02/15/2014  ? Procedure: LAPAROSCOPIC CHOLECYSTECTOMY with IOC;  Surgeon: Gayland Curry, MD;  Location: [place];  Service: General;  Laterality: N/A;  ? COLONOSCOPY    ? CORONARY ARTERY BYPASS GRAFT  1995  ? ESOPHAGOGASTRODUODENOSCOPY N/A 03/02/2014  ? Procedure: ESOPHAGOGASTRODUODENOSCOPY (EGD);  Surgeon: Ladene Artist, MD;  Location: [facility] ENDOSCOPY;  Service: Endoscopy;  Laterality: N/A;  sarah/leone  ? ?Social History  ? ?Socioeconomic History  ? Marital status: Widowed  ?  Spouse name: Not on file  ? Number of children: 1  ? Years of education: Not on file  ?  Highest education level: Not on file  ?Occupational History  ? Occupation: retired, Artist  ?Tobacco Use  ? Smoking status: Former  ?  Types: Cigarettes  ?  Quit date: 03/13/1976  ?  Years since quitting: 45.5  ? Smokeless tobacco: Never  ?Vaping Use  ? Vaping Use: Never used  ?Substance and Sexual Activity  ? Alcohol use: No  ?  Alcohol/week: 0.0 standard drinks  ? Drug use: No  ? Sexual activity: Not on file  ?Other Topics Concern  ? Not on file  ?Social History Narrative  ? Born in [place], has a large extended family, oldest of several brothers-sister   ? Son anf G son live near by   ? Lost wife 2019. Lives by himself   ? ?Social Determinants of Health  ? ?Financial Resource Strain: Not on file  ?Food Insecurity: Not on file  ?Transportation Needs: Not on file  ?Physical Activity: Not on file  ?Stress: Not on file  ?Social Connections: Not on file  ?Intimate Partner Violence: Not on file  ? ? ?Current Outpatient Medications  ?Medication Instructions  ? amLODipine (NORVASC) 10 MG tablet TAKE 1 TABLET BY MOUTH EVERY DAY  ? aspirin 81 mg, Oral, Daily  ?  atenolol (TENORMIN) 50 MG tablet TAKE 1 AND 1/2 TABLETS DAILY BY MOUTH  ? atorvastatin (LIPITOR) 20 MG tablet TAKE 1 TABLET BY MOUTH EVERY DAY  ? benazepril (LOTENSIN) 20 MG tablet TAKE 1 TABLET BY MOUTH EVERY DAY  ? diazepam (VALIUM) 10 mg, Oral, Every 12 hours PRN  ? diclofenac Sodium (VOLTAREN) 4 g, Topical, 4 times daily, To affected joint.  ? docusate sodium (COLACE) 100 mg, Oral, Daily PRN  ? fexofenadine (ALLEGRA) 180 mg, Oral, Daily,    ? ? ?   ?Objective:  ? Physical Exam ?BP 126/72 (BP Location: Left Arm, Patient Position: Sitting, Cuff Size: Small)   Pulse (!) 56   Temp 98 ?F (36.7 ?C) (Oral)   Resp 16   Ht '5\' 9"'$  (1.753 m)   Wt 188 lb 8 oz (85.5 kg)   SpO2 94%   BMI 27.84 kg/m?  ?General: ?Well developed, NAD, BMI noted ?Neck: No  thyromegaly  ?HEENT:  ?Normocephalic . Face symmetric, atraumatic ?Lungs:  ?CTA B ?Normal respiratory  effort, no intercostal retractions, no accessory muscle use. ?Heart: RRR,  no murmur.  ?Abdomen:  ?Not distended, soft, non-tender. No rebound or rigidity.  No bruit  ?lower extremities: no pretibial edema bilaterally  ?Skin: Exposed areas without rash. Not pale. Not jaundice ?Neurologic:  ?alert & oriented X3.  ?Speech normal, gait appropriate for age and unassisted ?Strength symmetric and appropriate for age.  ?Psych: ?Cognition and judgment appear intact.  ?Cooperative with normal attention span and concentration.  ?Behavior appropriate. ?No anxious or depressed appearing. ? ?   ?Assessment   ? ? 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 ? ?PLAN: ?Here for CPX ?Prediabetes: Has a healthy lifestyle, check A1c ?Insomnia: On diazepam, contract and UDS today ?HTN: BP is very good today, continue amlodipine, Tenormin, Lotensin.  Labs. ?Hyperlipidemia: On Lipitor 20 mg, labs. ?Neck pain: Had exacerbation last year, resolved. ?CAD: No symptoms, continue present care ?Skin cancers: Sees dermatology regularly ?RTC 6 months ? ? ?  ?This visit occurred during the SARS-CoV-2 public health emergency.  Safety protocols were in place, including screening questions prior to the visit, additional usage of staff PPE, and extensive cleaning of exam room while observing appropriate contact time as indicated for disinfecting solutions.  ? ?

## 2021-10-04 ENCOUNTER — Telehealth: Payer: Self-pay | Admitting: Internal Medicine

## 2021-10-04 NOTE — Telephone Encounter (Signed)
Requesting: diazepam '10mg'$   ?Contract: 10/02/2021 ?UDS: 10/02/2021 ?Last Visit: 10/02/2021 ?Next Visit: 04/04/2022 ?Last Refill: 07/02/2021 #30 and 2RF ? ?Please Advise ? ?

## 2021-10-05 LAB — DRUG MONITORING PANEL 375977 , URINE
Alcohol Metabolites: NEGATIVE ng/mL (ref <500)
Alphahydroxyalprazolam: NEGATIVE ng/mL (ref <25)
Alphahydroxymidazolam: NEGATIVE ng/mL (ref <50)
Alphahydroxytriazolam: NEGATIVE ng/mL (ref <50)
Aminoclonazepam: NEGATIVE ng/mL (ref <25)
Amphetamines: NEGATIVE ng/mL (ref <500)
Barbiturates: NEGATIVE ng/mL (ref <300)
Benzodiazepines: POSITIVE ng/mL — AB (ref <100)
Cocaine Metabolite: NEGATIVE ng/mL (ref <150)
Desmethyltramadol: NEGATIVE ng/mL (ref <100)
Hydroxyethylflurazepam: NEGATIVE ng/mL (ref <50)
Lorazepam: NEGATIVE ng/mL (ref <50)
Marijuana Metabolite: NEGATIVE ng/mL (ref <20)
Nordiazepam: 408 ng/mL — ABNORMAL HIGH (ref <50)
Opiates: NEGATIVE ng/mL (ref <100)
Oxazepam: 661 ng/mL — ABNORMAL HIGH (ref <50)
Oxycodone: NEGATIVE ng/mL (ref <100)
Temazepam: 524 ng/mL — ABNORMAL HIGH (ref <50)
Tramadol: NEGATIVE ng/mL (ref <100)

## 2021-10-05 LAB — DM TEMPLATE

## 2021-10-05 NOTE — Telephone Encounter (Signed)
PDMP okay, Rx sent 

## 2021-11-28 ENCOUNTER — Ambulatory Visit (HOSPITAL_COMMUNITY)
Admission: RE | Admit: 2021-11-28 | Discharge: 2021-11-28 | Disposition: A | Discharge status: Home / Self Care | Payer: Medicare Other | Source: Ambulatory Visit | Attending: Cardiology | Admitting: Cardiology

## 2021-11-28 DIAGNOSIS — I701 Atherosclerosis of renal artery: Secondary | ICD-10-CM | POA: Diagnosis not present

## 2022-01-08 DIAGNOSIS — L814 Other melanin hyperpigmentation: Secondary | ICD-10-CM | POA: Diagnosis not present

## 2022-01-08 DIAGNOSIS — L570 Actinic keratosis: Secondary | ICD-10-CM | POA: Diagnosis not present

## 2022-01-14 ENCOUNTER — Other Ambulatory Visit: Payer: Self-pay | Admitting: Internal Medicine

## 2022-04-04 ENCOUNTER — Ambulatory Visit (INDEPENDENT_AMBULATORY_CARE_PROVIDER_SITE_OTHER): Payer: Medicare Other | Admitting: Internal Medicine

## 2022-04-04 ENCOUNTER — Encounter: Payer: Self-pay | Admitting: Internal Medicine

## 2022-04-04 VITALS — BP 136/76 | HR 75 | Temp 97.9°F | Resp 18 | Ht 69.0 in | Wt 188.0 lb

## 2022-04-04 DIAGNOSIS — I10 Essential (primary) hypertension: Secondary | ICD-10-CM | POA: Diagnosis not present

## 2022-04-04 DIAGNOSIS — E785 Hyperlipidemia, unspecified: Secondary | ICD-10-CM

## 2022-04-04 DIAGNOSIS — Z23 Encounter for immunization: Secondary | ICD-10-CM | POA: Diagnosis not present

## 2022-04-04 LAB — BASIC METABOLIC PANEL
BUN: 33 mg/dL — ABNORMAL HIGH (ref 6–23)
CO2: 25 mEq/L (ref 19–32)
Calcium: 9.1 mg/dL (ref 8.4–10.5)
Chloride: 107 mEq/L (ref 96–112)
Creatinine, Ser: 1.64 mg/dL — ABNORMAL HIGH (ref 0.40–1.50)
GFR: 39.01 mL/min — ABNORMAL LOW (ref >60.00)
Glucose, Bld: 95 mg/dL (ref 70–99)
Potassium: 4.2 mEq/L (ref 3.5–5.1)
Sodium: 140 mEq/L (ref 135–145)

## 2022-04-04 NOTE — Patient Instructions (Addendum)
Vaccines I recommend:  Covid booster  RSV vaccine  Check the  blood pressure regularly BP GOAL is between 110/65 and  135/85. If it is consistently higher or lower, let me know    GO TO THE LAB : Get the blood work     [place], [place] back for a physical exam by 09-2022

## 2022-04-04 NOTE — Progress Notes (Signed)
Subjective:    Patient ID: Dustin Ashley, male    DOB: Jan 06, 1941, 81 y.o.   MRN: [id number]  DOS:  04/04/2022 Type of visit - description: Follow-up  Since the last office visit is doing well. Few weeks ago did have an episode when his blood pressure got low in the context of not eating and going to exercise at the [facility]. No chest pain, no LOC, symptoms quickly resolved after he ate something and rested.  Review of Systems See above   Past Medical History:  Diagnosis Date   Allergic rhinitis    Allergy    seasonal   Arthritis    Bell palsy    CAD (coronary artery disease)    Cataract    Chronic renal insufficiency, stage II (mild)    Cr ~ 1.4, u/s 4-12 normal kidneys   Gallstone pancreatitis 2015   GERD (gastroesophageal reflux disease)    HOH (hard of hearing)    has a hearing aid L, sees audiology rountinely   Hyperglycemia    A1C 5.8 04-2010   Hyperlipemia    Hypertension    Pain, joint, multiple sites    uses valium, occ uses for insomnia. uses for shoulder  pain   Paroxysmal atrial fibrillation (HCC)    Personal history of colonic polyps    Pulmonary emboli ([place])    02-2014   RAS (renal artery stenosis) ([place]) 05-2012    Renal insufficiency    SCC (squamous cell carcinoma)     Past Surgical History:  Procedure Laterality Date   CARDIAC CATHETERIZATION     CATARACT EXTRACTION     CHOLECYSTECTOMY N/A 02/15/2014   Procedure: LAPAROSCOPIC CHOLECYSTECTOMY with [place];  Surgeon: Gayland Curry, MD;  Location: [place];  Service: General;  Laterality: N/A;   COLONOSCOPY     CORONARY ARTERY BYPASS GRAFT  1995   ESOPHAGOGASTRODUODENOSCOPY N/A 03/02/2014   Procedure: ESOPHAGOGASTRODUODENOSCOPY (EGD);  Surgeon: Ladene Artist, MD;  Location[facility] ENDOSCOPY;  Service: Endoscopy;  Laterality: N/A;  sarah/leone    Current Outpatient Medications  Medication Instructions   amLODipine (NORVASC) 10 MG tablet TAKE 1 TABLET BY MOUTH EVERY DAY   aspirin 81 mg, Oral, Daily   atenolol  (TENORMIN) 50 MG tablet TAKE 1 AND 1/2 TABLETS DAILY BY MOUTH   atorvastatin (LIPITOR) 20 MG tablet TAKE 1 TABLET BY MOUTH EVERY DAY   benazepril (LOTENSIN) 20 MG tablet TAKE 1 TABLET BY MOUTH EVERY DAY   diazepam (VALIUM) 10 mg, Oral, Every 12 hours PRN   diclofenac Sodium (VOLTAREN) 4 g, Topical, 4 times daily, To affected joint.   docusate sodium (COLACE) 100 mg, Oral, Daily PRN   fexofenadine (ALLEGRA) 180 mg, Oral, Daily,         Objective:   Physical Exam BP 136/76   Pulse 75   Temp 97.9 F (36.6 C) (Oral)   Resp 18   Ht '5\' 9"'$  (1.753 m)   Wt 188 lb (85.3 kg)   SpO2 98%   BMI 27.76 kg/m  General:   Well developed, NAD, BMI noted. HEENT:  Normocephalic . Face symmetric, atraumatic Lungs:  CTA B Normal respiratory effort, no intercostal retractions, no accessory muscle use. Heart: RRR,  no murmur.  Lower extremities: no pretibial edema bilaterally  Skin: Not pale. Not jaundice Neurologic:  alert & oriented X3.  Speech normal, gait appropriate for age and unassisted Psych--  Cognition and judgment appear intact.  Cooperative with normal attention span and concentration.  Behavior appropriate. No  anxious or depressed appearing.      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  BP is well controlled today, check BMP.  Did have episode of low blood pressure in the context of not eating and exercising.  Encouraged good hydration and no prolonged fasting.  He verbalized  understanding Hyperlipidemia: On Lipitor, last FLP satisfactory History of RAS: Recent ultrasound stable Preventive care: Flu shot today, declines COVID booster, benefits discussed. RTC CPX 09-2022

## 2022-04-05 NOTE — Assessment & Plan Note (Signed)
HTN: On amlodipine, atenolol, Lotensin.  BP is well controlled today, check BMP.  Did have episode of low blood pressure in the context of not eating and exercising.  Encouraged good hydration and no prolonged fasting.  He verbalized understanding Hyperlipidemia: On Lipitor, last FLP satisfactory History of RAS: Recent ultrasound stable Preventive care: Flu shot today, declines COVID booster, benefits discussed. RTC CPX 09-2022

## 2022-04-08 ENCOUNTER — Ambulatory Visit (INDEPENDENT_AMBULATORY_CARE_PROVIDER_SITE_OTHER): Payer: Medicare Other | Admitting: *Deleted

## 2022-04-08 DIAGNOSIS — Z0000 Encounter for general adult medical examination without abnormal findings: Secondary | ICD-10-CM | POA: Diagnosis not present

## 2022-04-08 NOTE — Progress Notes (Addendum)
Subjective:   Dustin Ashley is a 81 y.o. male who presents for Medicare Annual/Subsequent preventive examination. I connected with  Margaretha Seeds on 04/08/22 by a audio enabled telemedicine application and verified that I am speaking with the correct person using two identifiers.  Patient Location: Home  Provider Location: Office/Clinic  I discussed the limitations of evaluation and management by telemedicine. The patient expressed understanding and agreed to proceed.   Review of Systems    Defer to PCP Cardiac Risk Factors include: advanced age (>38mn, >>28women);dyslipidemia;hypertension;male gender     Objective:    There were no vitals filed for this visit. There is no height or weight on file to calculate BMI.     04/08/2022   10:22 AM 09/07/2018    9:07 AM 03/27/2016   11:43 AM 08/07/2015   11:42 AM 03/09/2015    8:48 AM 03/06/2014    4:54 PM 03/02/2014   10:22 AM  Advanced Directives  Does Patient Have a Medical Advance Directive? Yes No Yes No Yes Yes Yes  Type of AParamed[place]of [place]ount [place] will  Living will  Living will Healthcare Power of [place] will  Does patient want to make changes to medical advance directive? No - Patient declined     No - Patient declined   Copy of [place] Chart? No - copy requested     No - copy requested No - copy requested  Would patient like information on creating a medical advance directive?    No - patient declined information       Current Medications (verified) Outpatient Encounter Medications as of 04/08/2022  Medication Sig   amLODipine (NORVASC) 10 MG tablet TAKE 1 TABLET BY MOUTH EVERY DAY   aspirin 81 MG tablet Take 81 mg by mouth daily.   atenolol (TENORMIN) 50 MG tablet TAKE 1 AND 1/2 TABLETS DAILY BY MOUTH   atorvastatin (LIPITOR) 20 MG tablet TAKE 1 TABLET BY MOUTH EVERY DAY   benazepril (LOTENSIN) 20 MG tablet TAKE 1 TABLET BY MOUTH  EVERY DAY   diazepam (VALIUM) 10 MG tablet TAKE 1 TABLET (10 MG TOTAL) BY MOUTH EVERY 12 (TWELVE) HOURS AS NEEDED FOR ANXIETY OR SLEEP.   diclofenac Sodium (VOLTAREN) 1 % GEL Apply 4 g topically 4 (four) times daily. To affected joint.   docusate sodium (COLACE) 100 MG capsule Take 100 mg by mouth daily as needed (constipation).   fexofenadine (ALLEGRA) 180 MG tablet Take 180 mg by mouth daily.   No facility-administered encounter medications on file as of 04/08/2022.    Allergies (verified) Hydrocodone-acetaminophen and Tramadol hcl   History: Past Medical History:  Diagnosis Date   Allergic rhinitis    Allergy    seasonal   Arthritis    Bell palsy    CAD (coronary artery disease)    Cataract    Chronic renal insufficiency, stage II (mild)    Cr ~ 1.4, u/s 4-12 normal kidneys   Gallstone pancreatitis 2015   GERD (gastroesophageal reflux disease)    HOH (hard of hearing)    has a hearing aid L, sees audiology rountinely   Hyperglycemia    A1C 5.8 04-2010   Hyperlipemia    Hypertension    Pain, joint, multiple sites    uses valium, occ uses for insomnia. uses for shoulder  pain   Paroxysmal atrial fibrillation (HCC)    Personal history of colonic polyps    Pulmonary  emboli ([place])    02-2014   RAS (renal artery stenosis) ([place]) 05-2012    Renal insufficiency    SCC (squamous cell carcinoma)    Past Surgical History:  Procedure Laterality Date   CARDIAC CATHETERIZATION     CATARACT EXTRACTION     CHOLECYSTECTOMY N/A 02/15/2014   Procedure: LAPAROSCOPIC CHOLECYSTECTOMY with IOC;  Surgeon: Gayland Curry, MD;  Location: [place];  Service: General;  Laterality: N/A;   COLONOSCOPY     CORONARY ARTERY BYPASS GRAFT  1995   ESOPHAGOGASTRODUODENOSCOPY N/A 03/02/2014   Procedure: ESOPHAGOGASTRODUODENOSCOPY (EGD);  Surgeon: Ladene Artist, MD;  Location: [facility] ENDOSCOPY;  Service: Endoscopy;  Laterality: N/A;  sarah/leone   Family History  Problem Relation Age of Onset   Hypertension  Father    Heart attack Brother        2 brothers, CABG   Diabetes Neg Hx    Colon cancer Neg Hx    Prostate cancer Neg Hx        colon, prostate   Rectal cancer Neg Hx    Stomach cancer Neg Hx    Social History   Socioeconomic History   Marital status: Widowed    Spouse name: Not on file   Number of children: 1   Years of education: Not on file   Highest education level: Not on file  Occupational History   Occupation: retired, delivery truck driver  Tobacco Use   Smoking status: Former    Types: Cigarettes    Quit date: 03/13/1976    Years since quitting: 46.1   Smokeless tobacco: Never  Vaping Use   Vaping Use: Never used  Substance and Sexual Activity   Alcohol use: No    Alcohol/week: 0.0 standard drinks of alcohol   Drug use: No   Sexual activity: Not on file  Other Topics Concern   Not on file  Social History Narrative   Born in [place], has a large extended family, oldest of several brothers-sister    Son anf G son live near by    Lost wife 2019. Lives by himself    Social Determinants of Health   Financial Resource Strain: Low Risk  (04/08/2022)   Overall Financial Resource Strain (CARDIA)    Difficulty of Paying Living Expenses: Not hard at all  Food Insecurity: No Food Insecurity (04/08/2022)   Hunger Vital Sign    Worried About Running Out of Food in the Last Year: Never true    Ran Out of Food in the Last Year: Never true  Transportation Needs: No Transportation Needs (04/08/2022)   PRAPARE - Hydrologist (Medical): No    Lack of Transportation (Non-Medical): No  Physical Activity: Inactive (04/08/2022)   Exercise Vital Sign    Days of Exercise per Week: 0 days    Minutes of Exercise per Session: 0 min  Stress: No Stress Concern Present (04/08/2022)   [place]    Feeling of Stress : Not at all  Social Connections: Moderately Isolated (04/08/2022)   Social  Connection and Isolation Panel [NHANES]    Frequency of Communication with Friends and Family: More than three times a week    Frequency of Social Gatherings with Friends and Family: More than three times a week    Attends Religious Services: More than 4 times per year    Active Member of [employer] or Organizations: No    Attends Archivist Meetings: Never  Marital Status: Widowed    Tobacco Counseling Counseling given: Not Answered   Clinical Intake:  Pre-visit preparation completed: Yes  Pain : No/denies pain     Diabetes: No  How often do you need to have someone help you when you read instructions, pamphlets, or other written materials from your doctor or pharmacy?: 1 - Never  Diabetic? No  Activities of Daily Living    04/08/2022   10:25 AM  In your present state of health, do you have any difficulty performing the following activities:  Hearing? 1  Comment wears hearing aids  Vision? 0  Difficulty concentrating or making decisions? 0  Walking or climbing stairs? 0  Dressing or bathing? 0  Doing errands, shopping? 0  Preparing Food and eating ? N  Using the Toilet? N  In the past six months, have you accidently leaked urine? N  Do you have problems with loss of bowel control? N  Managing your Medications? N  Managing your Finances? N  Housekeeping or managing your Housekeeping? N    Patient Care Team: Colon Branch, MD as PCP - Cyndia Diver, MD as PCP - Cardiology (Cardiology) Gatha Mayer, MD as Consulting Physician (Gastroenterology)  Indicate any recent Medical Services you may have received from other than [facility] providers in the past year (date may be approximate).     Assessment:   This is a routine wellness examination for [place].  Hearing/Vision screen No results found.  Dietary issues and exercise activities discussed: Current Exercise Habits: Home exercise routine, Type of exercise: treadmill;strength training/weights,  Time (Minutes): > 60, Frequency (Times/Week): 3, Weekly Exercise (Minutes/Week): 0, Intensity: Moderate, Exercise limited by: None identified   Goals Addressed   None    Depression Screen    04/08/2022   10:33 AM 04/04/2022    9:02 AM 10/02/2021    8:52 AM 03/30/2021    1:01 PM 03/02/2021    9:38 AM 09/26/2020   10:18 AM 09/21/2019    8:32 AM  PHQ 2/9 Scores  PHQ - 2 Score 0 0 0 0 0 0 0    Fall Risk    04/08/2022   10:20 AM 04/04/2022    9:02 AM 10/02/2021    8:52 AM 03/30/2021    1:01 PM 03/02/2021    9:38 AM  [place] in the past year? 0 0 0 0 0  Number falls in past yr: 0 0 0 0 0  Injury with Fall? 0 0 0 0 0  Risk for fall due to : No Fall Risks    No Fall Risks  Follow up Falls evaluation completed Falls evaluation completed  Falls evaluation completed Falls evaluation completed    [place]:  Any stairs in or around the home? Yes  If so, are there any without handrails? No  Home free of loose throw rugs in walkways, pet beds, electrical cords, etc? Yes  Adequate lighting in your home to reduce risk of falls? Yes   ASSISTIVE DEVICES UTILIZED TO PREVENT FALLS:  Life alert? No  Use of a cane, walker or w/c? No  Grab bars in the bathroom? Yes  Shower chair or bench in shower? No  Elevated toilet seat or a handicapped toilet? No   TIMED UP AND GO:  Was the test performed? No . Audio visit   Cognitive Function:        04/08/2022   10:29 AM  6CIT Screen  What  Year? 0 points  What month? 0 points  What time? 0 points  Count back from 20 0 points  Months in reverse 4 points  Repeat phrase 4 points  Total Score 8 points    Immunizations Immunization History  Administered Date(s) Administered   Fluad Quad(high Dose 65+) 03/23/2019, 05/29/2020, 03/30/2021, 04/04/2022   Influenza Split 03/25/2012   Influenza Whole 07/15/2005, 06/02/2007, 04/19/2008, 04/28/2009, 05/02/2010   Influenza, High Dose Seasonal PF 05/15/2018    Influenza, Seasonal, Injecte, Preservative Fre 06/02/2013   Influenza,inj,Quad PF,6+ Mos 04/12/2014, 05/26/2015   Influenza-Unspecified 05/21/2016, 05/01/2017   PFIZER(Purple Top)SARS-COV-2 Vaccination 08/28/2019, 09/22/2019, 06/15/2020   Pneumococcal Conjugate-13 04/12/2014   Pneumococcal Polysaccharide-23 11/13/2003, 11/13/2006, 09/17/2018   Td 11/13/2003, 11/13/2006, 04/20/2014   Zoster Recombinat (Shingrix) 10/09/2020, 12/11/2020    TDAP status: Up to date  Flu Vaccine status: Up to date  Pneumococcal vaccine status: Up to date  Covid-19 vaccine status: Information provided on how to obtain vaccines.   Qualifies for Shingles Vaccine? Yes   Zostavax completed No   Shingrix Completed?: Yes  Screening Tests Health Maintenance  Topic Date Due   TETANUS/TDAP  04/20/2024   Pneumonia Vaccine 32+ Years old  Completed   INFLUENZA VACCINE  Completed   Zoster Vaccines- Shingrix  Completed   HPV VACCINES  Aged Out   COLONOSCOPY (Pts 45-42yr Insurance coverage will need to be confirmed)  Discontinued   COVID-19 Vaccine  Discontinued    Health Maintenance  There are no preventive care reminders to display for this patient.  Colorectal cancer screening: No longer required.   Lung Cancer Screening: (Low Dose CT Chest recommended if Age 81-80years, 30 pack-year currently smoking OR have quit w/in 15years.) does not qualify.   Lung Cancer Screening Referral: N/a  Additional Screening:  Hepatitis C Screening: does not qualify; Completed N/a  Vision Screening: Recommended annual ophthalmology exams for early detection of glaucoma and other disorders of the eye. Is the patient up to date with their annual eye exam?  Yes  Who is the provider or what is the name of the office in which the patient attends annual eye exams[facility] [place] pt is not established with a provider, would they like to be referred to a provider to establish care? No .   Dental Screening:  Recommended annual dental exams for proper oral hygiene  Community Resource Referral / Chronic Care Management: CRR required this visit?  No   CCM required this visit?  No      Plan:     I have personally reviewed and noted the following in the patient's chart:   Medical and social history Use of alcohol, tobacco or illicit drugs  Current medications and supplements including opioid prescriptions. Patient is not currently taking opioid prescriptions. Functional ability and status Nutritional status Physical activity Advanced directives List of other physicians Hospitalizations, surgeries, and ER visits in previous 12 months Vitals Screenings to include cognitive, depression, and falls Referrals and appointments  In addition, I have reviewed and discussed with patient certain preventive protocols, quality metrics, and best practice recommendations. A written personalized care plan for preventive services as well as general preventive health recommendations were provided to patient.   Due to this being a telephonic visit, the after visit summary with patients personalized plan was offered to patient via mail or my-chart. Patient was mailed a copy of AVS.  BBeatris Ship CArd[place]  04/08/2022   Nurse Notes: None  I have reviewed and agree  with [facility] Coaches documentation.  [name], MD

## 2022-04-08 NOTE — Patient Instructions (Signed)
Dustin Ashley , Thank you for taking time to come for your Medicare Wellness Visit. I appreciate your ongoing commitment to your health goals. Please review the following plan we discussed and let me know if I can assist you in the future.   These are the goals we discussed:  Goals   None     This is a list of the screening recommended for you and due dates:  Health Maintenance  Topic Date Due   Tetanus Vaccine  04/20/2024   Pneumonia Vaccine  Completed   Flu Shot  Completed   Zoster (Shingles) Vaccine  Completed   HPV Vaccine  Aged Out   Colon Cancer Screening  Discontinued   COVID-19 Vaccine  Discontinued      Next appointment: Follow up in one year for your annual wellness visit.   Preventive Care 47 Years and Older, Male Preventive care refers to lifestyle choices and visits with your health care provider that can promote health and wellness. What does preventive care include? A yearly physical exam. This is also called an annual well check. Dental exams once or twice a year. Routine eye exams. Ask your health care provider how often you should have your eyes checked. Personal lifestyle choices, including: Daily care of your teeth and gums. Regular physical activity. Eating a healthy diet. Avoiding tobacco and drug use. Limiting alcohol use. Practicing safe sex. Taking low doses of aspirin every day. Taking vitamin and mineral supplements as recommended by your health care provider. What happens during an annual well check? The services and screenings done by your health care provider during your annual well check will depend on your age, overall health, lifestyle risk factors, and family history of disease. Counseling  Your health care provider may ask you questions about your: Alcohol use. Tobacco use. Drug use. Emotional well-being. Home and relationship well-being. Sexual activity. Eating habits. History of falls. Memory and ability to understand  (cognition). Work and work Statistician. Screening  You may have the following tests or measurements: Height, weight, and BMI. Blood pressure. Lipid and cholesterol levels. These may be checked every 5 years, or more frequently if you are over 58 years old. Skin check. Lung cancer screening. You may have this screening every year starting at age 66 if you have a 30-pack-year history of smoking and currently smoke or have quit within the past 15 years. Fecal occult blood test (FOBT) of the stool. You may have this test every year starting at age 38. Flexible sigmoidoscopy or colonoscopy. You may have a sigmoidoscopy every 5 years or a colonoscopy every 10 years starting at age 29. Prostate cancer screening. Recommendations will vary depending on your family history and other risks. Hepatitis C blood test. Hepatitis B blood test. Sexually transmitted disease (STD) testing. Diabetes screening. This is done by checking your blood sugar (glucose) after you have not eaten for a while (fasting). You may have this done every 1-3 years. Abdominal aortic aneurysm (AAA) screening. You may need this if you are a current or former smoker. Osteoporosis. You may be screened starting at age 20 if you are at high risk. Talk with your health care provider about your test results, treatment options, and if necessary, the need for more tests. Vaccines  Your health care provider may recommend certain vaccines, such as: Influenza vaccine. This is recommended every year. Tetanus, diphtheria, and acellular pertussis (Tdap, Td) vaccine. You may need a Td booster every 10 years. Zoster vaccine. You may need this after  age 36. Pneumococcal 13-valent conjugate (PCV13) vaccine. One dose is recommended after age 72. Pneumococcal polysaccharide (PPSV23) vaccine. One dose is recommended after age 82. Talk to your health care provider about which screenings and vaccines you need and how often you need them. This  information is not intended to replace advice given to you by your health care provider. Make sure you discuss any questions you have with your health care provider. Document Released: 07/28/2015 Document Revised: 03/20/2016 Document Reviewed: 05/02/2015 Elsevier Interactive Patient Education  2017 [place] Prevention in the Home Falls can cause injuries. They can happen to people of all ages. There are many things you can do to make your home safe and to help prevent falls. What can I do on the outside of my home? Regularly fix the edges of walkways and driveways and fix any cracks. Remove anything that might make you trip as you walk through a door, such as a raised step or threshold. Trim any bushes or trees on the path to your home. Use bright outdoor lighting. Clear any walking paths of anything that might make someone trip, such as rocks or tools. Regularly check to see if handrails are loose or broken. Make sure that both sides of any steps have handrails. Any raised decks and porches should have guardrails on the edges. Have any leaves, snow, or ice cleared regularly. Use sand or salt on walking paths during winter. Clean up any spills in your garage right away. This includes oil or grease spills. What can I do in the bathroom? Use night lights. Install grab bars by the toilet and in the tub and shower. Do not use towel bars as grab bars. Use non-skid mats or decals in the tub or shower. If you need to sit down in the shower, use a plastic, non-slip stool. Keep the floor dry. Clean up any water that spills on the floor as soon as it happens. Remove soap buildup in the tub or shower regularly. Attach bath mats securely with double-sided non-slip rug tape. Do not have throw rugs and other things on the floor that can make you trip. What can I do in the bedroom? Use night lights. Make sure that you have a light by your bed that is easy to reach. Do not use any sheets or  blankets that are too big for your bed. They should not hang down onto the floor. Have a firm chair that has side arms. You can use this for support while you get dressed. Do not have throw rugs and other things on the floor that can make you trip. What can I do in the kitchen? Clean up any spills right away. Avoid walking on wet floors. Keep items that you use a lot in easy-to-reach places. If you need to reach something above you, use a strong step stool that has a grab bar. Keep electrical cords out of the way. Do not use floor polish or wax that makes floors slippery. If you must use wax, use non-skid floor wax. Do not have throw rugs and other things on the floor that can make you trip. What can I do with my stairs? Do not leave any items on the stairs. Make sure that there are handrails on both sides of the stairs and use them. Fix handrails that are broken or loose. Make sure that handrails are as long as the stairways. Check any carpeting to make sure that it is firmly attached to the stairs.  Fix any carpet that is loose or worn. Avoid having throw rugs at the top or bottom of the stairs. If you do have throw rugs, attach them to the floor with carpet tape. Make sure that you have a light switch at the top of the stairs and the bottom of the stairs. If you do not have them, ask someone to add them for you. What else can I do to help prevent falls? Wear shoes that: Do not have high heels. Have rubber bottoms. Are comfortable and fit you well. Are closed at the toe. Do not wear sandals. If you use a stepladder: Make sure that it is fully opened. Do not climb a closed stepladder. Make sure that both sides of the stepladder are locked into place. Ask someone to hold it for you, if possible. Clearly mark and make sure that you can see: Any grab bars or handrails. First and last steps. Where the edge of each step is. Use tools that help you move around (mobility aids) if they are  needed. These include: Canes. Walkers. Scooters. Crutches. Turn on the lights when you go into a dark area. Replace any light bulbs as soon as they burn out. Set up your furniture so you have a clear path. Avoid moving your furniture around. If any of your floors are uneven, fix them. If there are any pets around you, be aware of where they are. Review your medicines with your doctor. Some medicines can make you feel dizzy. This can increase your chance of falling. Ask your doctor what other things that you can do to help prevent falls. This information is not intended to replace advice given to you by your health care provider. Make sure you discuss any questions you have with your health care provider. Document Released: 04/27/2009 Document Revised: 12/07/2015 Document Reviewed: 08/05/2014 Elsevier Interactive Patient Education  2017 [employer].

## 2022-04-15 ENCOUNTER — Telehealth: Payer: Self-pay | Admitting: Internal Medicine

## 2022-04-15 NOTE — Telephone Encounter (Signed)
PDMP okay, Rx sent 

## 2022-04-15 NOTE — Telephone Encounter (Signed)
Requesting: diazepam '10mg'$   Contract:10/02/21 UDS: 10/02/21 Last Visit: 04/04/22 Next Visit: 10/08/22 Last Refill:10/05/21 #30 and 5RF  Please Advise

## 2022-04-23 ENCOUNTER — Encounter: Payer: Self-pay | Admitting: Cardiovascular Disease

## 2022-04-23 ENCOUNTER — Ambulatory Visit: Payer: Medicare Other | Attending: Cardiovascular Disease | Admitting: Cardiovascular Disease

## 2022-04-23 VITALS — BP 130/70 | HR 56 | Ht 69.0 in | Wt 188.8 lb

## 2022-04-23 DIAGNOSIS — I2510 Atherosclerotic heart disease of native coronary artery without angina pectoris: Secondary | ICD-10-CM

## 2022-04-23 DIAGNOSIS — E782 Mixed hyperlipidemia: Secondary | ICD-10-CM

## 2022-04-23 DIAGNOSIS — R0989 Other specified symptoms and signs involving the circulatory and respiratory systems: Secondary | ICD-10-CM

## 2022-04-23 DIAGNOSIS — I10 Essential (primary) hypertension: Secondary | ICD-10-CM | POA: Diagnosis not present

## 2022-04-23 DIAGNOSIS — I701 Atherosclerosis of renal artery: Secondary | ICD-10-CM

## 2022-04-23 NOTE — Patient Instructions (Signed)
Medication Instructions:  Your physician recommends that you continue on your current medications as directed. Please refer to the Current Medication list given to you today.  *If you need a refill on your cardiac medications before your next appointment, please call your pharmacy*   Lab Work: NONE If you have labs (blood work) drawn today and your tests are completely normal, you will receive your results only by: [place] (if you have MyChart) OR A paper copy in the mail If you have any lab test that is abnormal or we need to change your treatment, we will call you to review the results.   Testing/Procedures: Carotid Ultrasound Your physician has requested that you have a carotid duplex. This test is an ultrasound of the carotid arteries in your neck. It looks at blood flow through these arteries that supply the brain with blood. Allow one hour for this exam. There are no restrictions or special instructions.  Follow-Up: At [facility], you and your health needs are our priority.  As part of our continuing mission to provide you with exceptional heart care, we have created designated Provider Care Teams.  These Care Teams include your primary Cardiologist (physician) and Advanced Practice Providers (APPs -  Physician Assistants and Nurse Practitioners) who all work together to provide you with the care you need, when you need it.  We recommend signing up for the patient portal called "MyChart".  Sign up information is provided on this After Visit Summary.  MyChart is used to connect with patients for Virtual Visits (Telemedicine).  Patients are able to view lab/test results, encounter notes, upcoming appointments, etc.  Non-urgent messages can be sent to your provider as well.   To learn more about what you can do with MyChart, go to [email or web address].    Your next appointment:   1 year(s)  The format for your next appointment:   In Person  Provider:   Sherren Mocha, MD       Important Information About Sugar

## 2022-04-23 NOTE — Progress Notes (Signed)
Cardiology Office Note:    Date:  04/23/2022   ID:  Dustin Ashley, DOB 1941-05-26, MRN [id number]  PCP:  Colon Branch, MD   [place] Prov[facility] Cardiologist:  Sherren Mocha, MD     Referring MD: Colon Branch, MD   Chief Complaint  Patient presents with   Coronary Artery Disease    History of Present Illness:    Dustin Ashley is a 81 y.o. male presenting for follow-up of renal artery stenosis, coronary artery disease with remote CABG, hypertension, and mixed hyperlipidemia.  His renal artery stenosis has been managed medically with treatment of hypertension over the years.  His blood pressure has been well controlled and his kidney size has been normal on serial ultrasound imaging studies.  The patient is here alone today.  He is doing well and reports no recent heart problems.  [facility]  He denies any exertional chest pain, chest pressure, edema, heart palpitations, orthopnea, PND.  He has had no stroke or TIA symptoms.  Reports very good control of his blood pressure at home.  On rare occasion he has symptoms of low blood pressure and he has attributed this to not eating or drinking enough.  Past Medical History:  Diagnosis Date   Allergic rhinitis    Allergy    seasonal   Arthritis    Bell palsy    CAD (coronary artery disease)    Cataract    Chronic renal insufficiency, stage II (mild)    Cr ~ 1.4, u/s 4-12 normal kidneys   Gallstone pancreatitis 2015   GERD (gastroesophageal reflux disease)    HOH (hard of hearing)    has a hearing aid L, sees audiology rountinely   Hyperglycemia    A1C 5.8 04-2010   Hyperlipemia    Hypertension    Pain, joint, multiple sites    uses valium, occ uses for insomnia. uses for shoulder  pain   Paroxysmal atrial fibrillation (HCC)    Personal history of colonic polyps    Pulmonary emboli ([place])    02-2014   RAS (renal artery stenosis) ([place]) 05-2012    Renal insufficiency     SCC (squamous cell carcinoma)     Past Surgical History:  Procedure Laterality Date   CARDIAC CATHETERIZATION     CATARACT EXTRACTION     CHOLECYSTECTOMY N/A 02/15/2014   Procedure: LAPAROSCOPIC CHOLECYSTECTOMY with [place];  Surgeon: Gayland Curry, MD;  Location: [facility];  Service: General;  Laterality: N/A;   COLONOSCOPY     CORONARY ARTERY BYPASS GRAFT  1995   ESOPHAGOGASTRODUODENOSCOPY N/A 03/02/2014   Procedure: ESOPHAGOGASTRODUODENOSCOPY (EGD);  Surgeon: Ladene Artist, MD;  [facility] ENDOSCOPY;  Service: Endoscopy;  Laterality: N/A;  sarah/leone    Current Medications: Current Meds  Medication Sig   amLODipine (NORVASC) 10 MG tablet TAKE 1 TABLET BY MOUTH EVERY DAY   aspirin 81 MG tablet Take 81 mg by mouth daily.   atenolol (TENORMIN) 50 MG tablet Take 1.5 tablets (75 mg total) by mouth daily.   atorvastatin (LIPITOR) 20 MG tablet Take 1 tablet (20 mg total) by mouth daily.   benazepril (LOTENSIN) 20 MG tablet TAKE 1 TABLET BY MOUTH EVERY DAY   diazepam (VALIUM) 10 MG tablet TAKE 1 TABLET (10 MG TOTAL) BY MOUTH EVERY 12 (TWELVE) HOURS AS NEEDED FOR ANXIETY OR SLEEP.   docusate sodium (COLACE) 100 MG capsule Take 100 mg by mouth daily  as needed (constipation).   fexofenadine (ALLEGRA) 180 MG tablet Take 180 mg by mouth daily.     Allergies:   Hydrocodone-acetaminophen and Tramadol hcl   Social History   Socioeconomic History   Marital status: Widowed    Spouse name: Not on file   Number of children: 1   Years of education: Not on file   Highest education level: Not on file  Occupational History   Occupation: retired, delivery truck driver  Tobacco Use   Smoking status: Former    Types: Cigarettes    Quit date: 03/13/1976    Years since quitting: 46.1   Smokeless tobacco: Never  Vaping Use   Vaping Use: Never used  Substance and Sexual Activity   Alcohol use: No    Alcohol/week: 0.0 standard drinks of alcohol   Drug use: No   Sexual activity: Not on file  Other  Topics Concern   Not on file  Social History Narrative   Born in [place], has a large extended family, oldest of several brothers-sister    Son anf G son live near by    Lost wife 2019. Lives by himself    Social Determinants of Health   Financial Resource Strain: Low Risk  (04/08/2022)   Overall Financial Resource Strain (CARDIA)    Difficulty of Paying Living Expenses: Not hard at all  Food Insecurity: No Food Insecurity (04/08/2022)   Hunger Vital Sign    Worried About Running Out of Food in the Last Year: Never true    Ran Out of Food in the Last Year: Never true  Transportation Needs: No Transportation Needs (04/08/2022)   PRAPARE - Hydrologist (Medical): No    Lack of Transportation (Non-Medical): No  Physical Activity: Inactive (04/08/2022)   Exercise Vital Sign    Days of Exercise per Week: 0 days    Minutes of Exercise per Session: 0 min  Stress: No Stress Concern Present (04/08/2022)   [place]    Feeling of Stress : Not at all  Social Connections: Moderately Isolated (04/08/2022)   Social Connection and Isolation Panel [NHANES]    Frequency of Communication with Friends and Family: More than three times a week    Frequency of Social Gatherings with Friends and Family: More than three times a week    Attends Religious Services: More than 4 times per year    Active Member of [employer] or Organizations: No    Attends Archivist Meetings: Never    Marital Status: Widowed     Family History: The patient's family history includes Heart attack in his brother; Hypertension in his father. There is no history of Diabetes, Colon cancer, Prostate cancer, Rectal cancer, or Stomach cancer.  ROS:   Please see the history of present illness.    All other systems reviewed and are negative.  EKGs/Labs/Other Studies Reviewed:    The following studies were reviewed today: Renal  arterial doppler 11/28/2021: Right: Evidence of a > 60% stenosis of the right renal artery. RRV         flow present. Normal size right kidney. Normal right         Resisitive Index. Normal cortical thickness of right kidney.  Left:  Evidence of a > 60% stenosis in the left renal artery. LRV         flow present. Normal size of left kidney. Normal left  Resistive Index. Normal cortical thickness of the left         kidney.  Mesenteric:  70 to 99% stenosis in the celiac artery and superior mesenteric artery.  EKG:  EKG is ordered today.  The ekg ordered today demonstrates sinus bradycardia 56 bpm with 1st degree AV block, occasional PAC  Recent Labs: 10/02/2021: ALT 13; TSH 3.08 04/04/2022: BUN 33; Creatinine, Ser 1.64; Potassium 4.2; Sodium 140  Recent Lipid Panel    Component Value Date/Time   CHOL 126 10/02/2021 0919   TRIG 148.0 10/02/2021 0919   TRIG 92 06/26/2006 1042   HDL 36.50 (L) 10/02/2021 0919   CHOLHDL 3 10/02/2021 0919   VLDL 29.6 10/02/2021 0919   LDLCALC 60 10/02/2021 0919     Risk Assessment/Calculations:                Physical Exam:    VS:  BP 130/70   Pulse (!) 56   Ht '5\' 9"'$  (1.753 m)   Wt 188 lb 12.8 oz (85.6 kg)   SpO2 96%   BMI 27.88 kg/m     Wt Readings from Last 3 Encounters:  04/23/22 188 lb 12.8 oz (85.6 kg)  04/04/22 188 lb (85.3 kg)  10/02/21 188 lb 8 oz (85.5 kg)     GEN:  Well nourished, well developed in no acute distress HEENT: Normal NECK: No JVD; No carotid bruits LYMPHATICS: No lymphadenopathy CARDIAC: RRR, no murmurs, rubs, gallops RESPIRATORY:  Clear to auscultation without rales, wheezing or rhonchi  ABDOMEN: Soft, non-tender, non-distended MUSCULOSKELETAL:  No edema; No deformity  SKIN: Warm and dry NEUROLOGIC:  Alert and oriented x 3 PSYCHIATRIC:  Normal affect   ASSESSMENT:    1. Coronary artery disease involving native coronary artery of native heart without angina pectoris   2. Renal artery stenosis  ([place])   3. Essential hypertension   4. Mixed hyperlipidemia   5. Left carotid bruit    PLAN:    In order of problems listed above:  The patient is stable with no symptoms of angina now approaching 3 decades out from bypass surgery.  He is doing very well and continues on a regimen of aspirin, a beta-blocker, and a statin drug.  No changes are made today in his medical regimen. I reviewed his most recent renal arterial duplex as outlined above.  He has severe bilateral renal artery stenosis with normal kidney size and well-controlled blood pressure.  He has been followed for many years and will continue with ultrasound surveillance. Blood pressure is well controlled on a combination of atenolol and benazepril.  Most recent creatinine is 1.64.  Labs are followed regularly by his PCP. Treated with atorvastatin.  LDL cholesterol is 60 mg/dL. Recommend a carotid ultrasound as the patient is at increased risk of carotid stenosis with known renal artery stenosis and coronary artery disease.  He is on appropriate medical therapy with aspirin, beta-blocker, and an ACE inhibitor.           Medication Adjustments/Labs and Tests Ordered: Current medicines are reviewed at length with the patient today.  Concerns regarding medicines are outlined above.  Orders Placed This Encounter  Procedures   EKG 12-Lead   VAS US CAROTID   No orders of the defined types were placed in this encounter.   Patient Instructions  Medication Instructions:  Your physician recommends that you continue on your current medications as directed. Please refer to the Current Medication list given to you today.  *If you need  a refill on your cardiac medications before your next appointment, please call your pharmacy*   Lab Work: NONE If you have labs (blood work) drawn today and your tests are completely normal, you will receive your results only by: [place] (if you have MyChart) OR A paper copy in the mail If  you have any lab test that is abnormal or we need to change your treatment, we will call you to review the results.   Testing/Procedures: Carotid Ultrasound Your physician has requested that you have a carotid duplex. This test is an ultrasound of the carotid arteries in your neck. It looks at blood flow through these arteries that supply the brain with blood. Allow one hour for this exam. There are no restrictions or special instructions.  Follow-Up: At [facility], you and your health needs are our priority.  As part of our continuing mission to provide you with exceptional heart care, we have created designated Provider Care Teams.  These Care Teams include your primary Cardiologist (physician) and Advanced Practice Providers (APPs -  Physician Assistants and Nurse Practitioners) who all work together to provide you with the care you need, when you need it.  We recommend signing up for the patient portal called "MyChart".  Sign up information is provided on this After Visit Summary.  MyChart is used to connect with patients for Virtual Visits (Telemedicine).  Patients are able to view lab/test results, encounter notes, upcoming appointments, etc.  Non-urgent messages can be sent to your provider as well.   To learn more about what you can do with MyChart, go to [email or web address].    Your next appointment:   1 year(s)  The format for your next appointment:   In Person  Provider:   Sherren Mocha, MD       Important Information About Sugar         Signed, Sherren Mocha, MD  04/23/2022 3:16 PM    [place]

## 2022-04-24 ENCOUNTER — Ambulatory Visit: Payer: Medicare Other | Admitting: Family Medicine

## 2022-04-24 VITALS — BP 187/78 | Ht 69.0 in | Wt 185.0 lb

## 2022-04-24 DIAGNOSIS — S53402A Unspecified sprain of left elbow, initial encounter: Secondary | ICD-10-CM | POA: Insufficient documentation

## 2022-04-24 NOTE — Patient Instructions (Signed)
Good to see you Please use ice as needed  Please use the voltaren as needed  You can consider the compression  Please try the exercises   Please send me a message in MyChart with any questions or updates.  Please see me back in 4 weeks or as needed if better.   --Dr. Raeford Razor

## 2022-04-24 NOTE — Progress Notes (Signed)
  Dustin Ashley - 81 y.o. male MRN [id number]  Date of birth: [birth year]  SUBJECTIVE:  Including CC & ROS.  No chief complaint on file.   Dustin Ashley is a 81 y.o. male that is presenting with acute left elbow pain.  Initially the pain was medial and then occurred lateral.  He noticed the pain when he was doing a chest fly press.  Did get improvement with the Voltaren gel.    Review of Systems See HPI   HISTORY: Past Medical, Surgical, Social, and Family History Reviewed & Updated per EMR.   Pertinent Historical Findings include:  Past Medical History:  Diagnosis Date   Allergic rhinitis    Allergy    seasonal   Arthritis    Bell palsy    CAD (coronary artery disease)    Cataract    Chronic renal insufficiency, stage II (mild)    Cr ~ 1.4, u/s 4-12 normal kidneys   Gallstone pancreatitis 2015   GERD (gastroesophageal reflux disease)    HOH (hard of hearing)    has a hearing aid L, sees audiology rountinely   Hyperglycemia    A1C 5.8 04-2010   Hyperlipemia    Hypertension    Pain, joint, multiple sites    uses valium, occ uses for insomnia. uses for shoulder  pain   Paroxysmal atrial fibrillation (HCC)    Personal history of colonic polyps    Pulmonary emboli ([place])    02-2014   RAS (renal artery stenosis) ([place]) 05-2012    Renal insufficiency    SCC (squamous cell carcinoma)     Past Surgical History:  Procedure Laterality Date   CARDIAC CATHETERIZATION     CATARACT EXTRACTION     CHOLECYSTECTOMY N/A 02/15/2014   Procedure: LAPAROSCOPIC CHOLECYSTECTOMY with [place];  Surgeon: Gayland Curry, MD;  Location: [place];  Service: General;  Laterality: N/A;   COLONOSCOPY     CORONARY ARTERY BYPASS GRAFT  1995   ESOPHAGOGASTRODUODENOSCOPY N/A 03/02/2014   Procedure: ESOPHAGOGASTRODUODENOSCOPY (EGD);  Surgeon: Ladene Artist, MD;  Location: [facility] ENDOSCOPY;  Service: Endoscopy;  Laterality: N/A;  sarah/leone     PHYSICAL EXAM:  VS: BP (!) 187/78   Ht '5\' 9"'$  (1.753 m)   Wt 185  lb (83.9 kg)   BMI 27.32 kg/m  Physical Exam Gen: NAD, alert, cooperative with exam, well-appearing MSK:  Neurovascularly intact       ASSESSMENT & PLAN:   Sprain of left elbow Acutely occurring.  Symptoms most consistent with more of a sprain given the mechanism of this pain. -Counseled on home exercise therapy and supportive care. -Counseled on compression. -Could consider physical therapy or injection.

## 2022-04-24 NOTE — Assessment & Plan Note (Signed)
Acutely occurring.  Symptoms most consistent with more of a sprain given the mechanism of this pain. -Counseled on home exercise therapy and supportive care. -Counseled on compression. -Could consider physical therapy or injection.

## 2022-05-03 ENCOUNTER — Other Ambulatory Visit: Payer: Self-pay | Admitting: Cardiovascular Disease

## 2022-05-03 ENCOUNTER — Ambulatory Visit (HOSPITAL_COMMUNITY)
Admission: RE | Admit: 2022-05-03 | Discharge: 2022-05-03 | Disposition: A | Discharge status: Home / Self Care | Payer: Medicare Other | Source: Ambulatory Visit | Attending: Cardiovascular Disease | Admitting: Cardiovascular Disease

## 2022-05-03 DIAGNOSIS — R0989 Other specified symptoms and signs involving the circulatory and respiratory systems: Secondary | ICD-10-CM | POA: Insufficient documentation

## 2022-06-27 DIAGNOSIS — Z86007 Personal history of in-situ neoplasm of skin: Secondary | ICD-10-CM | POA: Diagnosis not present

## 2022-06-27 DIAGNOSIS — L821 Other seborrheic keratosis: Secondary | ICD-10-CM | POA: Diagnosis not present

## 2022-06-27 DIAGNOSIS — Z09 Encounter for follow-up examination after completed treatment for conditions other than malignant neoplasm: Secondary | ICD-10-CM | POA: Diagnosis not present

## 2022-06-27 DIAGNOSIS — Z08 Encounter for follow-up examination after completed treatment for malignant neoplasm: Secondary | ICD-10-CM | POA: Diagnosis not present

## 2022-06-27 DIAGNOSIS — D225 Melanocytic nevi of trunk: Secondary | ICD-10-CM | POA: Diagnosis not present

## 2022-06-27 DIAGNOSIS — L814 Other melanin hyperpigmentation: Secondary | ICD-10-CM | POA: Diagnosis not present

## 2022-06-27 DIAGNOSIS — L570 Actinic keratosis: Secondary | ICD-10-CM | POA: Diagnosis not present

## 2022-07-22 ENCOUNTER — Ambulatory Visit: Payer: Medicare Other | Admitting: Family Medicine

## 2022-07-22 ENCOUNTER — Ambulatory Visit: Payer: Self-pay

## 2022-07-22 ENCOUNTER — Encounter: Payer: Self-pay | Admitting: Family Medicine

## 2022-07-22 VITALS — BP 118/80 | Ht 69.0 in | Wt 188.0 lb

## 2022-07-22 DIAGNOSIS — M778 Other enthesopathies, not elsewhere classified: Secondary | ICD-10-CM | POA: Diagnosis not present

## 2022-07-22 DIAGNOSIS — M19012 Primary osteoarthritis, left shoulder: Secondary | ICD-10-CM

## 2022-07-22 MED ORDER — TRIAMCINOLONE ACETONIDE 40 MG/ML IJ SUSP
40.0000 mg | Freq: Once | INTRAMUSCULAR | Status: AC
  Administered 2022-07-22: 40 mg via INTRA_ARTICULAR

## 2022-07-22 NOTE — Patient Instructions (Signed)
Good to see you Please alternate heat and ice  Please try the exercises   Please send me a message in MyChart with any questions or updates.  Please see me back in 4-6 weeks or as needed if better.   --Dr. Raeford Razor

## 2022-07-22 NOTE — Progress Notes (Signed)
  Dustin Ashley - 82 y.o. male MRN [id number]  Date of birth: 1940/12/29  SUBJECTIVE:  Including CC & ROS.  No chief complaint on file.   Dustin Ashley is a 82 y.o. male that is presenting with acute left shoulder pain.  Having limited range of motion.  Not as painful as his right shoulder.    Review of Systems See HPI   HISTORY: Past Medical, Surgical, Social, and Family History Reviewed & Updated per EMR.   Pertinent Historical Findings include:  Past Medical History:  Diagnosis Date   Allergic rhinitis    Allergy    seasonal   Arthritis    Bell palsy    CAD (coronary artery disease)    Cataract    Chronic renal insufficiency, stage II (mild)    Cr ~ 1.4, u/s 4-12 normal kidneys   Gallstone pancreatitis 2015   GERD (gastroesophageal reflux disease)    HOH (hard of hearing)    has a hearing aid L, sees audiology rountinely   Hyperglycemia    A1C 5.8 04-2010   Hyperlipemia    Hypertension    Pain, joint, multiple sites    uses valium, occ uses for insomnia. uses for shoulder  pain   Paroxysmal atrial fibrillation (HCC)    Personal history of colonic polyps    Pulmonary emboli ([place])    02-2014   RAS (renal artery stenosis) ([place]) 05-2012    Renal insufficiency    SCC (squamous cell carcinoma)     Past Surgical History:  Procedure Laterality Date   CARDIAC CATHETERIZATION     CATARACT EXTRACTION     CHOLECYSTECTOMY N/A 02/15/2014   Procedure: LAPAROSCOPIC CHOLECYSTECTOMY with [place];  Surgeon: Gayland Curry, MD;  Location: [place];  Service: General;  Laterality: N/A;   COLONOSCOPY     CORONARY ARTERY BYPASS GRAFT  1995   ESOPHAGOGASTRODUODENOSCOPY N/A 03/02/2014   Procedure: ESOPHAGOGASTRODUODENOSCOPY (EGD);  Surgeon: Ladene Artist, MD;  Location: [facility] ENDOSCOPY;  Service: Endoscopy;  Laterality: N/A;  sarah/leone     PHYSICAL EXAM:  VS: BP 118/80   Ht '5\' 9"'$  (1.753 m)   Wt 188 lb (85.3 kg)   BMI 27.76 kg/m  Physical Exam Gen: NAD, alert, cooperative with exam,  well-appearing MSK:  Neurovascularly intact     Aspiration/Injection Procedure Note Dustin Ashley [birth year]  Procedure: Injection Indications: Left shoulder pain  Procedure Details Consent: Risks of procedure as well as the alternatives and risks of each were explained to the (patient/caregiver).  Consent for procedure obtained. Time Out: Verified patient identification, verified procedure, site/side was marked, verified correct patient position, special equipment/implants available, medications/allergies/relevent history reviewed, required imaging and test results available.  Performed.  The area was cleaned with iodine and alcohol swabs.    The left glenohumeral joint was injected with 3 cc of 1% lidocaine on a 22-gauge 3-1/2 inch needle.  The syringe was switched and a mixture containing 1 cc of 40 mg Kenalog and 4 cc of 0.25% bupivacaine was injected.  Ultrasound was used. Images were obtained in l short views showing the injection.     A sterile dressing was applied.  Patient did tolerate procedure well.     ASSESSMENT & PLAN:   Capsulitis of left shoulder Acutely occurring. Limited range of motion on exam with no effusion.  - counseled on home exercise therapy and supportive care - injection today  - could consider imaging or PT.

## 2022-07-22 NOTE — Assessment & Plan Note (Signed)
Acutely occurring. Limited range of motion on exam with no effusion.  - counseled on home exercise therapy and supportive care - injection today  - could consider imaging or PT.

## 2022-08-03 ENCOUNTER — Other Ambulatory Visit: Payer: Self-pay | Admitting: Internal Medicine

## 2022-08-09 ENCOUNTER — Ambulatory Visit (INDEPENDENT_AMBULATORY_CARE_PROVIDER_SITE_OTHER): Payer: Medicare Other | Admitting: Internal Medicine

## 2022-08-09 ENCOUNTER — Encounter: Payer: Self-pay | Admitting: Internal Medicine

## 2022-08-09 VITALS — BP 136/70 | HR 69 | Temp 98.4°F | Resp 16 | Ht 69.0 in | Wt 185.2 lb

## 2022-08-09 DIAGNOSIS — J020 Streptococcal pharyngitis: Secondary | ICD-10-CM

## 2022-08-09 LAB — POCT INFLUENZA A/B
Influenza A, POC: NEGATIVE
Influenza B, POC: NEGATIVE

## 2022-08-09 LAB — POCT RAPID STREP A (OFFICE): Rapid Strep A Screen: POSITIVE — AB

## 2022-08-09 LAB — POC COVID19 BINAXNOW: SARS Coronavirus 2 Ag: NEGATIVE

## 2022-08-09 MED ORDER — AMOXICILLIN 875 MG PO TABS: 875.0000 mg | ORAL_TABLET | Freq: Two times a day (BID) | ORAL | 0 refills | Status: DC

## 2022-08-09 NOTE — Patient Instructions (Addendum)
Take the antibiotic, amoxicillin as prescribed.  Rest, fluids , tylenol  For cough:  Take Mucinex DM or Robitussin-DM OTC.  Follow the instructions in the box.   For nasal congestion:   Use OTC Astepro 2 nasal sprays on each side of the nose twice daily until better  Avoid decongestants such as  Pseudoephedrine or phenylephrine       Call if not gradually better over the next  10 days   Call anytime if the symptoms are severe

## 2022-08-09 NOTE — Progress Notes (Signed)
Subjective:    Patient ID: Dustin Ashley, male    DOB: Dec 11, 1940, 82 y.o.   MRN: [id number]  DOS:  08/09/2022 Type of visit - description: Acute  Symptoms started 3 days ago: Mild sore throat, sinus congestion, stuffy nose, mild aches and pains. He also developed chest congestion and cough, only in 2 occasions he saw some yellowish sputum. No nausea vomiting. Mild headache  Review of Systems See above   Past Medical History:  Diagnosis Date   Allergic rhinitis    Allergy    seasonal   Arthritis    Bell palsy    CAD (coronary artery disease)    Cataract    Chronic renal insufficiency, stage II (mild)    Cr ~ 1.4, u/s 4-12 normal kidneys   Gallstone pancreatitis 2015   GERD (gastroesophageal reflux disease)    HOH (hard of hearing)    has a hearing aid L, sees audiology rountinely   Hyperglycemia    A1C 5.8 04-2010   Hyperlipemia    Hypertension    Pain, joint, multiple sites    uses valium, occ uses for insomnia. uses for shoulder  pain   Paroxysmal atrial fibrillation (HCC)    Personal history of colonic polyps    Pulmonary emboli ([place])    02-2014   RAS (renal artery stenosis) ([place]) 05-2012    Renal insufficiency    SCC (squamous cell carcinoma)     Past Surgical History:  Procedure Laterality Date   CARDIAC CATHETERIZATION     CATARACT EXTRACTION     CHOLECYSTECTOMY N/A 02/15/2014   Procedure: LAPAROSCOPIC CHOLECYSTECTOMY with [place];  Surgeon: Gayland Curry, MD;  Location: [place];  Service: General;  Laterality: N/A;   COLONOSCOPY     CORONARY ARTERY BYPASS GRAFT  1995   ESOPHAGOGASTRODUODENOSCOPY N/A 03/02/2014   Procedure: ESOPHAGOGASTRODUODENOSCOPY (EGD);  Surgeon: Ladene Artist, MD;  Location: [facility] ENDOSCOPY;  Service: Endoscopy;  Laterality: N/A;  sarah/leone    Current Outpatient Medications  Medication Instructions   amLODipine (NORVASC) 10 MG tablet TAKE 1 TABLET BY MOUTH EVERY DAY   aspirin 81 mg, Oral, Daily   atenolol (TENORMIN) 75 mg, Oral, Daily    atorvastatin (LIPITOR) 20 mg, Oral, Daily   benazepril (LOTENSIN) 20 MG tablet TAKE 1 TABLET BY MOUTH EVERY DAY   diazepam (VALIUM) 10 mg, Oral, Every 12 hours PRN   diclofenac Sodium (VOLTAREN) 4 g, Topical, 4 times daily, To affected joint.   docusate sodium (COLACE) 100 mg, Oral, Daily PRN   fexofenadine (ALLEGRA) 180 mg, Oral, Daily,         Objective:   Physical Exam BP (!) 144/84   Pulse 69   Temp 98.4 F (36.9 C) (Oral)   Resp 16   Ht '5\' 9"'$  (1.753 m)   Wt 185 lb 4 oz (84 kg)   SpO2 98%   BMI 27.36 kg/m   General:   Well developed, NAD, BMI noted. HEENT:  Normocephalic . Face symmetric, atraumatic. Throat: Symmetric, tonsils not seen, + moderate redness.  No white discharge Lungs:  CTA B Normal respiratory effort, no intercostal retractions, no accessory muscle use. Heart: RRR,  no murmur.  Lower extremities: no pretibial edema bilaterally  Skin: Not pale. Not jaundice Neurologic:  alert & oriented X3.  Speech normal, gait appropriate for age and unassisted Psych--  Cognition and judgment appear intact.  Cooperative with normal attention span and concentration.  Behavior appropriate. No anxious or depressed appearing.  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  Strep test came back positive.  Throat is moderately red. Plan: Amoxicillin x 10 days, supportive treatment, see AVS

## 2022-08-09 NOTE — Assessment & Plan Note (Signed)
Pharyngitis: COVID, flu tests came back negative.  Strep test came back positive.  Throat is moderately red. Plan: Amoxicillin x 10 days, supportive treatment, see AVS

## 2022-08-19 ENCOUNTER — Ambulatory Visit: Payer: Medicare Other | Admitting: Family Medicine

## 2022-10-01 ENCOUNTER — Other Ambulatory Visit: Payer: Self-pay | Admitting: Internal Medicine

## 2022-10-08 ENCOUNTER — Encounter: Payer: Self-pay | Admitting: Internal Medicine

## 2022-10-08 ENCOUNTER — Ambulatory Visit (INDEPENDENT_AMBULATORY_CARE_PROVIDER_SITE_OTHER): Payer: Medicare Other | Admitting: Internal Medicine

## 2022-10-08 VITALS — BP 132/70 | HR 56 | Temp 97.8°F | Resp 16 | Ht 69.0 in | Wt 191.1 lb

## 2022-10-08 DIAGNOSIS — I10 Essential (primary) hypertension: Secondary | ICD-10-CM | POA: Diagnosis not present

## 2022-10-08 DIAGNOSIS — Z79899 Other long term (current) drug therapy: Secondary | ICD-10-CM

## 2022-10-08 DIAGNOSIS — E785 Hyperlipidemia, unspecified: Secondary | ICD-10-CM | POA: Diagnosis not present

## 2022-10-08 DIAGNOSIS — Z0000 Encounter for general adult medical examination without abnormal findings: Secondary | ICD-10-CM | POA: Diagnosis not present

## 2022-10-08 DIAGNOSIS — G4700 Insomnia, unspecified: Secondary | ICD-10-CM | POA: Diagnosis not present

## 2022-10-08 LAB — COMPREHENSIVE METABOLIC PANEL
ALT: 16 U/L (ref 0–53)
AST: 17 U/L (ref 0–37)
Albumin: 3.9 g/dL (ref 3.5–5.2)
Alkaline Phosphatase: 99 U/L (ref 39–117)
BUN: 21 mg/dL (ref 6–23)
CO2: 28 mEq/L (ref 19–32)
Calcium: 9.1 mg/dL (ref 8.4–10.5)
Chloride: 105 mEq/L (ref 96–112)
Creatinine, Ser: 1.4 mg/dL (ref 0.40–1.50)
GFR: 47 mL/min — ABNORMAL LOW (ref >60.00)
Glucose, Bld: 96 mg/dL (ref 70–99)
Potassium: 4.4 mEq/L (ref 3.5–5.1)
Sodium: 141 mEq/L (ref 135–145)
Total Bilirubin: 0.7 mg/dL (ref 0.2–1.2)
Total Protein: 6.5 g/dL (ref 6.0–8.3)

## 2022-10-08 LAB — LIPID PANEL
Cholesterol: 136 mg/dL (ref 0–200)
HDL: 43.1 mg/dL (ref >39.00)
LDL Cholesterol: 67 mg/dL (ref 0–99)
NonHDL: 93.13
Total CHOL/HDL Ratio: 3
Triglycerides: 130 mg/dL (ref 0.0–149.0)
VLDL: 26 mg/dL (ref 0.0–40.0)

## 2022-10-08 LAB — CBC WITH DIFFERENTIAL/PLATELET
Basophils Absolute: 0.1 10*3/uL (ref 0.0–0.1)
Basophils Relative: 0.7 % (ref 0.0–3.0)
Eosinophils Absolute: 0.3 10*3/uL (ref 0.0–0.7)
Eosinophils Relative: 2.7 % (ref 0.0–5.0)
HCT: 44.4 % (ref 39.0–52.0)
Hemoglobin: 14.8 g/dL (ref 13.0–17.0)
Lymphocytes Relative: 28.8 % (ref 12.0–46.0)
Lymphs Abs: 2.7 10*3/uL (ref 0.7–4.0)
MCHC: 33.4 g/dL (ref 30.0–36.0)
MCV: 87.8 fl (ref 78.0–100.0)
Monocytes Absolute: 0.9 10*3/uL (ref 0.1–1.0)
Monocytes Relative: 9.5 % (ref 3.0–12.0)
Neutro Abs: 5.5 10*3/uL (ref 1.4–7.7)
Neutrophils Relative %: 58.3 % (ref 43.0–77.0)
Platelets: 177 10*3/uL (ref 150.0–400.0)
RBC: 5.05 Mil/uL (ref 4.22–5.81)
RDW: 14.6 % (ref 11.5–15.5)
WBC: 9.5 10*3/uL (ref 4.0–10.5)

## 2022-10-08 NOTE — Patient Instructions (Addendum)
  Vaccines I recommend at the pharmacy: Pneumonia shot (PNM 20) RSV COVID booster Flu shot every fall   Please bring [place] a copy of your Healthcare Power of Attorney for your chart.   Check the  blood pressure regularly BP GOAL is between 110/65 and  135/85. If it is consistently higher or lower, let me know      GO TO THE LAB : Get the blood work     [place], [place] back for   checkup in 6 to 8 months

## 2022-10-08 NOTE — Progress Notes (Unsigned)
Subjective:    Patient ID: Dustin Ashley, male    DOB: [birth year], 82 y.o.   MRN: [id number]  DOS:  10/08/2022 Type of visit - description: CPX  Doing well. Recently has had back pain, unable to exercise much.  No bladder or bowel incontinence, no paresthesias.  Review of Systems  Other than above, a 14 point review of systems is negative     Past Medical History:  Diagnosis Date   Allergic rhinitis    Allergy    seasonal   Arthritis    Bell palsy    CAD (coronary artery disease)    Cataract    Chronic renal insufficiency, stage II (mild)    Cr ~ 1.4, u/s 4-12 normal kidneys   Gallstone pancreatitis 2015   GERD (gastroesophageal reflux disease)    HOH (hard of hearing)    has a hearing aid L, sees audiology rountinely   Hyperglycemia    A1C 5.8 04-2010   Hyperlipemia    Hypertension    Pain, joint, multiple sites    uses valium, occ uses for insomnia. uses for shoulder  pain   Paroxysmal atrial fibrillation (HCC)    Personal history of colonic polyps    Pulmonary emboli ([place])    02-2014   RAS (renal artery stenosis) ([place]) 05-2012    Renal insufficiency    SCC (squamous cell carcinoma)     Past Surgical History:  Procedure Laterality Date   CARDIAC CATHETERIZATION     CATARACT EXTRACTION     CHOLECYSTECTOMY N/A 02/15/2014   Procedure: LAPAROSCOPIC CHOLECYSTECTOMY with [place];  Surgeon: Gayland Curry, MD;  Location: [place];  Service: General;  Laterality: N/A;   COLONOSCOPY     CORONARY ARTERY BYPASS GRAFT  1995   ESOPHAGOGASTRODUODENOSCOPY N/A 03/02/2014   Procedure: ESOPHAGOGASTRODUODENOSCOPY (EGD);  Surgeon: Ladene Artist, MD;  Location: [facility] ENDOSCOPY;  Service: Endoscopy;  Laterality: N/A;  sarah/leone   Social History   Socioeconomic History   Marital status: Widowed    Spouse name: Not on file   Number of children: 1   Years of education: Not on file   Highest education level: Not on file  Occupational History   Occupation: retired, delivery truck  driver  Tobacco Use   Smoking status: Former    Types: Cigarettes    Quit date: 03/13/1976    Years since quitting: 46.6   Smokeless tobacco: Never  Vaping Use   Vaping Use: Never used  Substance and Sexual Activity   Alcohol use: No    Alcohol/week: 0.0 standard drinks of alcohol   Drug use: No   Sexual activity: Not on file  Other Topics Concern   Not on file  Social History Narrative   Born in [place], has a large extended family, oldest of several brothers-sister    Son anf G son live near by    Lost wife 2019. Lives by himself    Social Determinants of Health   Financial Resource Strain: Low Risk  (04/08/2022)   Overall Financial Resource Strain (CARDIA)    Difficulty of Paying Living Expenses: Not hard at all  Food Insecurity: No Food Insecurity (04/08/2022)   Hunger Vital Sign    Worried About Running Out of Food in the Last Year: Never true    Ran Out of Food in the Last Year: Never true  Transportation Needs: No Transportation Needs (04/08/2022)   PRAPARE - Transportation    Lack of Transportation (Medical): No    Lack  of Transportation (Non-Medical): No  Physical Activity: Inactive (04/08/2022)   Exercise Vital Sign    Days of Exercise per Week: 0 days    Minutes of Exercise per Session: 0 min  Stress: No Stress Concern Present (04/08/2022)   [place]    Feeling of Stress : Not at all  Social Connections: Moderately Isolated (04/08/2022)   Social Connection and Isolation Panel [NHANES]    Frequency of Communication with Friends and Family: More than three times a week    Frequency of Social Gatherings with Friends and Family: More than three times a week    Attends Religious Services: More than 4 times per year    Active Member of [employer] or Organizations: No    Attends Archivist Meetings: Never    Marital Status: Widowed  Intimate Partner Violence: Not At Risk (04/08/2022)    Humiliation, Afraid, Rape, and Kick questionnaire    Fear of Current or Ex-Partner: No    Emotionally Abused: No    Physically Abused: No    Sexually Abused: No    Current Outpatient Medications  Medication Instructions   amLODipine (NORVASC) 10 MG tablet TAKE 1 TABLET BY MOUTH EVERY DAY   aspirin 81 mg, Oral, Daily   atenolol (TENORMIN) 75 mg, Oral, Daily   atorvastatin (LIPITOR) 20 mg, Oral, Daily   benazepril (LOTENSIN) 20 MG tablet TAKE 1 TABLET BY MOUTH EVERY DAY   diazepam (VALIUM) 10 mg, Oral, Every 12 hours PRN   diclofenac Sodium (VOLTAREN) 4 g, Topical, 4 times daily, To affected joint.   docusate sodium (COLACE) 100 mg, Oral, Daily PRN   fexofenadine (ALLEGRA) 180 mg, Oral, Daily,         Objective:   Physical Exam BP 132/70   Pulse (!) 56   Temp 97.8 F (36.6 C) (Oral)   Resp 16   Ht 5\' 9"  (1.753 m)   Wt 191 lb 2 oz (86.7 kg)   SpO2 94%   BMI 28.22 kg/m  General: Well developed, NAD, BMI noted Neck: No  thyromegaly  HEENT:  Normocephalic . Face symmetric, atraumatic Lungs:  CTA B Normal respiratory effort, no intercostal retractions, no accessory muscle use. Heart: RRR,  no murmur.  Abdomen:  Not distended, soft, non-tender. No rebound or rigidity.   Lower extremities: no pretibial edema bilaterally.  Subtle abdominal bruit noted. Skin: Exposed areas without rash. Not pale. Not jaundice Neurologic:  alert & oriented X3.  Speech normal, gait appropriate for age and unassisted Strength symmetric and appropriate for age.  Psych: Cognition and judgment appear intact.  Cooperative with normal attention span and concentration.  Behavior appropriate. No anxious or depressed appearing.     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  Checking labs Hyperlipidemia: On atorvastatin.  Checking labs MSK: History of neck and back pain, back pain is slightly exacerbated.  Recommend rest, Tylenol, call if not gradually  better. CAD, A-fib, RAS Saw cardiology October 23, felt to be stable, they plan to check routine ultrasounds of the renal arteries. They also recommend got a  carotid US: < 40% bilaterally.  Rx medical regimen. RTC 6 to 8 months

## 2022-10-09 ENCOUNTER — Encounter: Payer: Self-pay | Admitting: Internal Medicine

## 2022-10-09 NOTE — Assessment & Plan Note (Signed)
-   Td 2015 - PNM 23: 2008, 09/2018; prevnar 13: 2015 - shingrex: x2 - Vaccines I recommend: PNM 20, RSV, COVID booster, flu shot q. fall -CCS, prostate cancer screening: No further, see previous entries -Labs: CMP FLP CBC UDS -Advance directives: See AVS - Not able to exercise much due to recent exacerbation of low back pain.  Encouraged stretching, if pain persist let me know.  Encouraged healthy diet

## 2022-10-09 NOTE — Assessment & Plan Note (Signed)
Here for CPX HTN: Seems well-controlled, continue monitoring at home, on amlodipine, atenolol, benazepril.  Checking labs Hyperlipidemia: On atorvastatin.  Checking labs MSK: History of neck and back pain, back pain is slightly exacerbated.  Recommend rest, Tylenol, call if not gradually better. CAD, A-fib, RAS Saw cardiology October 23, felt to be stable, they plan to check routine ultrasounds of the renal arteries. They also recommend got a  carotid US: < 40% bilaterally.  Rx medical regimen. RTC 6 to 8 months

## 2022-10-12 LAB — DRUG MONITORING PANEL 375977 , URINE
Alcohol Metabolites: NEGATIVE ng/mL (ref <500)
Alphahydroxyalprazolam: NEGATIVE ng/mL (ref <25)
Alphahydroxymidazolam: NEGATIVE ng/mL (ref <50)
Alphahydroxytriazolam: NEGATIVE ng/mL (ref <50)
Aminoclonazepam: NEGATIVE ng/mL (ref <25)
Amphetamines: NEGATIVE ng/mL (ref <500)
Barbiturates: NEGATIVE ng/mL (ref <300)
Benzodiazepines: POSITIVE ng/mL — AB (ref <100)
Cocaine Metabolite: NEGATIVE ng/mL (ref <150)
Desmethyltramadol: NEGATIVE ng/mL (ref <100)
Hydroxyethylflurazepam: NEGATIVE ng/mL (ref <50)
Lorazepam: NEGATIVE ng/mL (ref <50)
Marijuana Metabolite: NEGATIVE ng/mL (ref <20)
Nordiazepam: 284 ng/mL — ABNORMAL HIGH (ref <50)
Opiates: NEGATIVE ng/mL (ref <100)
Oxazepam: 552 ng/mL — ABNORMAL HIGH (ref <50)
Oxycodone: NEGATIVE ng/mL (ref <100)
Temazepam: 381 ng/mL — ABNORMAL HIGH (ref <50)
Tramadol: NEGATIVE ng/mL (ref <100)

## 2022-10-12 LAB — DM TEMPLATE

## 2022-10-15 ENCOUNTER — Telehealth: Payer: Self-pay | Admitting: Internal Medicine

## 2022-10-15 NOTE — Telephone Encounter (Signed)
Requesting: diazepam 10mg   Contract: 10/08/22 UDS: 10/08/22 Last Visit: 10/08/22 Next Visit: 04/11/23 Last Refill: 04/15/22 #30 and 5RF  Please Advise

## 2022-10-15 NOTE — Telephone Encounter (Signed)
PDMP okay, Rx sent 

## 2022-10-29 ENCOUNTER — Encounter: Payer: Self-pay | Admitting: *Deleted

## 2022-10-29 ENCOUNTER — Ambulatory Visit: Payer: Medicare Other | Admitting: Medical

## 2022-12-12 ENCOUNTER — Other Ambulatory Visit: Payer: Self-pay | Admitting: Internal Medicine

## 2022-12-27 DIAGNOSIS — L570 Actinic keratosis: Secondary | ICD-10-CM | POA: Diagnosis not present

## 2022-12-27 DIAGNOSIS — L578 Other skin changes due to chronic exposure to nonionizing radiation: Secondary | ICD-10-CM | POA: Diagnosis not present

## 2022-12-27 DIAGNOSIS — L821 Other seborrheic keratosis: Secondary | ICD-10-CM | POA: Diagnosis not present

## 2023-02-10 ENCOUNTER — Other Ambulatory Visit: Payer: Self-pay | Admitting: Internal Medicine

## 2023-03-12 ENCOUNTER — Other Ambulatory Visit: Payer: Self-pay | Admitting: Internal Medicine

## 2023-04-11 ENCOUNTER — Ambulatory Visit: Payer: Medicare Other | Admitting: Internal Medicine

## 2023-04-15 ENCOUNTER — Telehealth: Payer: Self-pay | Admitting: Internal Medicine

## 2023-04-15 NOTE — Telephone Encounter (Signed)
Requesting: diazepam 10mg   Contract: 10/08/22 UDS: 10/08/22 Last Visit: 10/08/22 Next Visit: 04/24/23 Last Refill: 10/15/22 #30 and 5RF   Please Advise

## 2023-04-16 ENCOUNTER — Other Ambulatory Visit: Payer: Self-pay | Admitting: Family

## 2023-04-16 MED ORDER — DIAZEPAM 10 MG PO TABS: 10.0000 mg | ORAL_TABLET | Freq: Two times a day (BID) | ORAL | 0 refills | Status: DC | PRN

## 2023-04-24 ENCOUNTER — Ambulatory Visit: Payer: Medicare Other | Admitting: Internal Medicine

## 2023-04-24 VITALS — BP 138/62 | HR 53 | Temp 98.2°F | Resp 18 | Ht 69.0 in | Wt 191.6 lb

## 2023-04-24 DIAGNOSIS — R7989 Other specified abnormal findings of blood chemistry: Secondary | ICD-10-CM | POA: Diagnosis not present

## 2023-04-24 DIAGNOSIS — I10 Essential (primary) hypertension: Secondary | ICD-10-CM

## 2023-04-24 DIAGNOSIS — Z23 Encounter for immunization: Secondary | ICD-10-CM

## 2023-04-24 LAB — BASIC METABOLIC PANEL
BUN: 30 mg/dL — ABNORMAL HIGH (ref 6–23)
CO2: 27 meq/L (ref 19–32)
Calcium: 9.4 mg/dL (ref 8.4–10.5)
Chloride: 105 meq/L (ref 96–112)
Creatinine, Ser: 1.52 mg/dL — ABNORMAL HIGH (ref 0.40–1.50)
GFR: 42.42 mL/min — ABNORMAL LOW (ref >60.00)
Glucose, Bld: 100 mg/dL — ABNORMAL HIGH (ref 70–99)
Potassium: 4.4 meq/L (ref 3.5–5.1)
Sodium: 141 meq/L (ref 135–145)

## 2023-04-24 LAB — HEMOGLOBIN A1C: Hgb A1c MFr Bld: 5.7 % (ref 4.6–6.5)

## 2023-04-24 NOTE — Patient Instructions (Signed)
Vaccine you may like to take: RSV  Continue checking your  blood pressure regularly Blood pressure goal:  between 110/65 and  135/85. If it is consistently higher or lower, let me know     GO TO THE LAB : Get the blood work     Next visit with me by March 2025, complete physical exam    Please schedule it at the front desk

## 2023-04-24 NOTE — Assessment & Plan Note (Signed)
HTN: BP upon arrival slightly elevated.  Recheck: 138/62 Ambulatory BPs checked twice a week, 124/70.  Plan: BMP, continue Norvasc-Tenormin-Lotensin. Prediabetes: Check A1c CAD: No angina.  Continue present care. Preventive care flu shot today, RSV recommended RTC 09-2023 CPX

## 2023-04-24 NOTE — Progress Notes (Signed)
Subjective:    Patient ID: Dustin Ashley, male    DOB: 1940/10/02, 82 y.o.   MRN: [id number]  DOS:  04/24/2023 Type of visit - description: Follow-up  Routine checkup, doing well. BP was noted to be slightly elevated but at home is normal. Denies chest pain or difficulty breathing. Has sporadic shoulder pain, not a new issue.   Review of Systems See above   Past Medical History:  Diagnosis Date   Allergic rhinitis    Allergy    seasonal   Arthritis    Bell palsy    CAD (coronary artery disease)    Cataract    Chronic renal insufficiency, stage II (mild)    Cr ~ 1.4, u/s 4-12 normal kidneys   Gallstone pancreatitis 2015   GERD (gastroesophageal reflux disease)    HOH (hard of hearing)    has a hearing aid L, sees audiology rountinely   Hyperglycemia    A1C 5.8 04-2010   Hyperlipemia    Hypertension    Pain, joint, multiple sites    uses valium, occ uses for insomnia. uses for shoulder  pain   Paroxysmal atrial fibrillation (HCC)    Personal history of colonic polyps    Pulmonary emboli (HCC)    02-2014   RAS (renal artery stenosis) (HCC) 05-2012    Renal insufficiency    SCC (squamous cell carcinoma)     Past Surgical History:  Procedure Laterality Date   CARDIAC CATHETERIZATION     CATARACT EXTRACTION     CHOLECYSTECTOMY N/A 02/15/2014   Procedure: LAPAROSCOPIC CHOLECYSTECTOMY with [place];  Surgeon: Atilano Ina, MD;  [facility] OR;  Service: General;  Laterality: N/A;   COLONOSCOPY     CORONARY ARTERY BYPASS GRAFT  1995   ESOPHAGOGASTRODUODENOSCOPY N/A 03/02/2014   Procedure: ESOPHAGOGASTRODUODENOSCOPY (EGD);  Surgeon: Meryl Dare, MD;  Location: [facility] ENDOSCOPY;  Service: Endoscopy;  Laterality: N/A;  sarah/leone    Current Outpatient Medications  Medication Instructions   amLODipine (NORVASC) 10 mg, Oral, Daily   aspirin 81 mg, Oral, Daily   atenolol (TENORMIN) 75 mg, Oral, Daily   atorvastatin (LIPITOR) 20 mg, Oral, Daily   benazepril (LOTENSIN) 20  mg, Oral, Daily   diazepam (VALIUM) 10 mg, Oral, Every 12 hours PRN   diclofenac Sodium (VOLTAREN) 4 g, Topical, 4 times daily, To affected joint.   docusate sodium (COLACE) 100 mg, Oral, Daily PRN   fexofenadine (ALLEGRA) 180 mg, Oral, Daily,         Objective:   Physical Exam BP (!) 162/78 (BP Location: Left Arm, Patient Position: Sitting, Cuff Size: Normal)   Pulse (!) 53   Temp 98.2 F (36.8 C) (Oral)   Resp 18   Ht 5\' 9"  (1.753 m)   Wt 191 lb 9.6 oz (86.9 kg)   SpO2 94%   BMI 28.29 kg/m  General:   Well developed, NAD, BMI noted. HEENT:  Normocephalic . Face symmetric, atraumatic Lungs:  CTA B Normal respiratory effort, no intercostal retractions, no accessory muscle use. Heart: RRR,  no murmur.  Lower extremities: no pretibial edema bilaterally  Skin: Not pale. Not jaundice Neurologic:  alert & oriented X3.  Speech normal, gait appropriate for age and unassisted Psych--  Cognition and judgment appear intact.  Cooperative with normal attention span and concentration.  Behavior appropriate. No anxious or depressed appearing.      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  Recheck: 138/62 Ambulatory BPs checked twice a week, 124/70.  Plan: BMP, continue Norvasc-Tenormin-Lotensin. Prediabetes: Check A1c CAD: No angina.  Continue present care. Preventive care flu shot today, RSV recommended RTC 09-2023 CPX

## 2023-06-11 ENCOUNTER — Ambulatory Visit: Payer: Medicare Other | Admitting: Internal Medicine

## 2023-06-11 ENCOUNTER — Encounter: Payer: Self-pay | Admitting: Internal Medicine

## 2023-06-11 VITALS — BP 136/86 | HR 56 | Temp 98.1°F | Resp 18 | Ht 69.0 in | Wt 193.2 lb

## 2023-06-11 DIAGNOSIS — M545 Low back pain, unspecified: Secondary | ICD-10-CM | POA: Diagnosis not present

## 2023-06-11 MED ORDER — PREDNISONE 10 MG PO TABS
ORAL_TABLET | ORAL | 0 refills | Status: DC
Start: 2023-06-11 — End: 2023-06-16

## 2023-06-11 NOTE — Progress Notes (Unsigned)
Subjective:    Patient ID: Dustin Ashley, male    DOB: 10-17-1940, 82 y.o.   MRN: [id number]  DOS:  06/11/2023 Type of visit - description: Acute  Developed back pain 4 days ago, no recent injury, no fever chills or rash. The pain is located at the mid low back. Increases when he sits up or down. Decreases with sitting and using a heating pad.  A couple of times, the pain radiated to the left leg, lateral side. Denies paresthesias or focal weakness. At some point he did experience some radiation to the left testicle but that is largely resolved.  No testicular swelling.  Also has mild chronic neck pain, he is not concerned about that today.   Review of Systems See above   Past Medical History:  Diagnosis Date   Allergic rhinitis    Allergy    seasonal   Arthritis    Bell palsy    CAD (coronary artery disease)    Cataract    Chronic renal insufficiency, stage II (mild)    Cr ~ 1.4, u/s 4-12 normal kidneys   Gallstone pancreatitis 2015   GERD (gastroesophageal reflux disease)    HOH (hard of hearing)    has a hearing aid L, sees audiology rountinely   Hyperglycemia    A1C 5.8 04-2010   Hyperlipemia    Hypertension    Pain, joint, multiple sites    uses valium, occ uses for insomnia. uses for shoulder  pain   Paroxysmal atrial fibrillation (HCC)    Personal history of colonic polyps    Pulmonary emboli (HCC)    02-2014   RAS (renal artery stenosis) (HCC) 05-2012    Renal insufficiency    SCC (squamous cell carcinoma)     Past Surgical History:  Procedure Laterality Date   CARDIAC CATHETERIZATION     CATARACT EXTRACTION     CHOLECYSTECTOMY N/A 02/15/2014   Procedure: LAPAROSCOPIC CHOLECYSTECTOMY with IOC;  Surgeon: Atilano Ina, MD;  Location: [facility] OR;  Service: General;  Laterality: N/A;   COLONOSCOPY     CORONARY ARTERY BYPASS GRAFT  1995   ESOPHAGOGASTRODUODENOSCOPY N/A 03/02/2014   Procedure: ESOPHAGOGASTRODUODENOSCOPY (EGD);  Surgeon: Meryl Dare, MD;   Location: [facility] ENDOSCOPY;  Service: Endoscopy;  Laterality: N/A;  sarah/leone    Current Outpatient Medications  Medication Instructions   amLODipine (NORVASC) 10 mg, Oral, Daily   aspirin 81 mg, Oral, Daily   atenolol (TENORMIN) 75 mg, Oral, Daily   atorvastatin (LIPITOR) 20 mg, Oral, Daily   benazepril (LOTENSIN) 20 mg, Oral, Daily   diazepam (VALIUM) 10 mg, Oral, Every 12 hours PRN   docusate sodium (COLACE) 100 mg, Oral, Daily PRN   fexofenadine (ALLEGRA) 180 mg, Oral, Daily,         Objective:   Physical Exam BP 136/86   Pulse (!) 56   Temp 98.1 F (36.7 C) (Oral)   Resp 18   Ht 5\' 9"  (1.753 m)   Wt 193 lb 4 oz (87.7 kg)   SpO2 95%   BMI 28.54 kg/m  General:   Well developed, NAD, BMI noted. HEENT:  Normocephalic . Face symmetric, atraumatic MSK: No TTP at the lumbar or SI joints. Hips rotation normal, nontender at the trochanteric bursas Lower extremities: no pretibial edema bilaterally  Skin: Not pale. Not jaundice Neurologic:  alert & oriented X3.  Speech normal, gait appropriate for age and unassisted Motor and DTR symmetric.  Straight leg raise negative. Psych--  Cognition and  judgment appear intact.  Cooperative with normal attention span and concentration.  Behavior appropriate. No anxious or depressed appearing.      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 As described above, had left leg pain a couple of times but no clear-cut symptoms of  radiculopathy. We agreed on conservative treatment: Continue with a heating pad and Tylenol.  Rx a round of prednisone.  He has taken some Valium he has  as needed and that is okay. Will call if not improving in the next few days.

## 2023-06-11 NOTE — Patient Instructions (Signed)
Take it easy for the next few days  Take prednisone as prescribed  Continue Tylenol  500 mg OTC 2 tabs a day every 8 hours as needed for pain  Continue using a heating pad  If not gradually better in the next few days, let me know

## 2023-06-12 NOTE — Assessment & Plan Note (Signed)
Acute lumbalgia. As described above, had left leg pain a couple of times but no clear-cut symptoms of radiculopathy. We agreed on conservative treatment: Continue with a heating pad and Tylenol.  Rx a round of prednisone.  He has taken some Valium he has  as needed and that is okay. Will call if not improving in the next few days.

## 2023-06-14 ENCOUNTER — Other Ambulatory Visit: Payer: Self-pay | Admitting: Family

## 2023-06-16 ENCOUNTER — Telehealth: Payer: Self-pay | Admitting: Internal Medicine

## 2023-06-16 MED ORDER — PREDNISONE 20 MG PO TABS: 20.0000 mg | ORAL_TABLET | Freq: Every day | ORAL | 0 refills | Status: DC

## 2023-06-16 NOTE — Telephone Encounter (Signed)
Advise patient: I sent a second round of prednisone, 20 mg daily x 5 days. If after prednisone he is not better or symptoms resurface: Needs to see sports medicine. Just call for a referral.

## 2023-06-16 NOTE — Telephone Encounter (Signed)
Spoke w/ Pt- informed of PCP recommendations. Pt verbalized understanding.  

## 2023-06-16 NOTE — Addendum Note (Signed)
Addended by: Willow Ora E on: 06/16/2023 11:18 AM   Modules accepted: Orders

## 2023-06-16 NOTE — Telephone Encounter (Signed)
Please advise- Pt was using for back pain.

## 2023-06-16 NOTE — Telephone Encounter (Signed)
Patient called and would like a med refill on  predniSONE (DELTASONE) 10 MG tablet   Please send to [facility] [address].

## 2023-06-17 ENCOUNTER — Telehealth: Payer: Self-pay | Admitting: Internal Medicine

## 2023-06-17 MED ORDER — DIAZEPAM 10 MG PO TABS: 10.0000 mg | ORAL_TABLET | Freq: Two times a day (BID) | ORAL | 3 refills | Status: DC | PRN

## 2023-06-17 NOTE — Addendum Note (Signed)
Addended byConrad [place] D on: 06/17/2023 09:27 AM   Modules accepted: Orders

## 2023-06-17 NOTE — Telephone Encounter (Signed)
PDMP okay, Rx sent 

## 2023-06-17 NOTE — Telephone Encounter (Signed)
Requesting: diazepam 10mg   Contract: 10/08/22 UDS: 10/08/22 Last Visit: 06/11/23 Next Visit: 10/13/23 Last Refill: 04/16/23 #30 and 0RF   Please Advise

## 2023-06-17 NOTE — Addendum Note (Signed)
Addended by: Wanda Plump on: 06/17/2023 04:33 PM   Modules accepted: Orders

## 2023-06-17 NOTE — Telephone Encounter (Signed)
Patient called regarding a med refill on    diazepam (VALIUM) 10 MG tablet   [facility] [phone]

## 2023-07-14 ENCOUNTER — Other Ambulatory Visit: Payer: Self-pay | Admitting: Internal Medicine

## 2023-09-18 DIAGNOSIS — L821 Other seborrheic keratosis: Secondary | ICD-10-CM | POA: Diagnosis not present

## 2023-09-18 DIAGNOSIS — L814 Other melanin hyperpigmentation: Secondary | ICD-10-CM | POA: Diagnosis not present

## 2023-09-18 DIAGNOSIS — L538 Other specified erythematous conditions: Secondary | ICD-10-CM | POA: Diagnosis not present

## 2023-09-18 DIAGNOSIS — L570 Actinic keratosis: Secondary | ICD-10-CM | POA: Diagnosis not present

## 2023-09-18 DIAGNOSIS — L820 Inflamed seborrheic keratosis: Secondary | ICD-10-CM | POA: Diagnosis not present

## 2023-09-18 DIAGNOSIS — L244 Irritant contact dermatitis due to drugs in contact with skin: Secondary | ICD-10-CM | POA: Diagnosis not present

## 2023-10-09 ENCOUNTER — Other Ambulatory Visit: Payer: Self-pay | Admitting: Internal Medicine

## 2023-10-13 ENCOUNTER — Encounter: Payer: Self-pay | Admitting: Internal Medicine

## 2023-10-13 ENCOUNTER — Ambulatory Visit (INDEPENDENT_AMBULATORY_CARE_PROVIDER_SITE_OTHER): Payer: Medicare Other | Admitting: Internal Medicine

## 2023-10-13 VITALS — BP 136/70 | HR 55 | Temp 98.2°F | Resp 12 | Ht 69.0 in | Wt 196.6 lb

## 2023-10-13 DIAGNOSIS — R600 Localized edema: Secondary | ICD-10-CM | POA: Diagnosis not present

## 2023-10-13 DIAGNOSIS — G4700 Insomnia, unspecified: Secondary | ICD-10-CM

## 2023-10-13 DIAGNOSIS — I10 Essential (primary) hypertension: Secondary | ICD-10-CM

## 2023-10-13 DIAGNOSIS — E785 Hyperlipidemia, unspecified: Secondary | ICD-10-CM | POA: Diagnosis not present

## 2023-10-13 DIAGNOSIS — H9190 Unspecified hearing loss, unspecified ear: Secondary | ICD-10-CM

## 2023-10-13 DIAGNOSIS — Z0001 Encounter for general adult medical examination with abnormal findings: Secondary | ICD-10-CM

## 2023-10-13 DIAGNOSIS — Z79899 Other long term (current) drug therapy: Secondary | ICD-10-CM | POA: Diagnosis not present

## 2023-10-13 LAB — LIPID PANEL
Cholesterol: 116 mg/dL (ref 0–200)
HDL: 37.4 mg/dL — ABNORMAL LOW (ref >39.00)
LDL Cholesterol: 59 mg/dL (ref 0–99)
NonHDL: 78.51
Total CHOL/HDL Ratio: 3
Triglycerides: 96 mg/dL (ref 0.0–149.0)
VLDL: 19.2 mg/dL (ref 0.0–40.0)

## 2023-10-13 LAB — BASIC METABOLIC PANEL WITH GFR
BUN: 25 mg/dL — ABNORMAL HIGH (ref 6–23)
CO2: 28 meq/L (ref 19–32)
Calcium: 9 mg/dL (ref 8.4–10.5)
Chloride: 109 meq/L (ref 96–112)
Creatinine, Ser: 1.65 mg/dL — ABNORMAL HIGH (ref 0.40–1.50)
GFR: 38.31 mL/min — ABNORMAL LOW (ref >60.00)
Glucose, Bld: 103 mg/dL — ABNORMAL HIGH (ref 70–99)
Potassium: 4.5 meq/L (ref 3.5–5.1)
Sodium: 145 meq/L (ref 135–145)

## 2023-10-13 LAB — CBC WITH DIFFERENTIAL/PLATELET
Basophils Absolute: 0.1 10*3/uL (ref 0.0–0.1)
Basophils Relative: 0.6 % (ref 0.0–3.0)
Eosinophils Absolute: 0.3 10*3/uL (ref 0.0–0.7)
Eosinophils Relative: 3 % (ref 0.0–5.0)
HCT: 42.4 % (ref 39.0–52.0)
Hemoglobin: 14.1 g/dL (ref 13.0–17.0)
Lymphocytes Relative: 24.1 % (ref 12.0–46.0)
Lymphs Abs: 2.3 10*3/uL (ref 0.7–4.0)
MCHC: 33.2 g/dL (ref 30.0–36.0)
MCV: 89.1 fl (ref 78.0–100.0)
Monocytes Absolute: 1 10*3/uL (ref 0.1–1.0)
Monocytes Relative: 10.5 % (ref 3.0–12.0)
Neutro Abs: 6 10*3/uL (ref 1.4–7.7)
Neutrophils Relative %: 61.8 % (ref 43.0–77.0)
Platelets: 168 10*3/uL (ref 150.0–400.0)
RBC: 4.76 Mil/uL (ref 4.22–5.81)
RDW: 14 % (ref 11.5–15.5)
WBC: 9.7 10*3/uL (ref 4.0–10.5)

## 2023-10-13 LAB — AST: AST: 14 U/L (ref 0–37)

## 2023-10-13 LAB — TSH: TSH: 2.72 u[IU]/mL (ref 0.35–5.50)

## 2023-10-13 LAB — ALT: ALT: 13 U/L (ref 0–53)

## 2023-10-13 NOTE — Patient Instructions (Signed)
 Vaccines to consider: RSV COVID booster Flu shot every fall  Plan if the leg swelling is worse let me know. Watch your salt intake Elevate your legs   Check the  blood pressure regularly Blood pressure goal:  between 110/65 and  135/85. If it is consistently higher or lower, let me know     GO TO THE LAB : Get the blood work     Please go to the front desk: Arrange for a follow-up in 6 months

## 2023-10-13 NOTE — Assessment & Plan Note (Signed)
 Here for CPX -Td 2015 - PNM 23: 2008, 09/2018; prevnar 13: 2015; PNM 20: 2024 - shingrex: x2 - Vaccines I recommend:   RSV, COVID booster, flu shot q. fall -CCS, prostate cancer screening: No further, see previous entries -Labs: See orders -Recommended healthy diet with low salt.  Plans to get more active and going back to the [facility]. Other issues addressed. HTN: BP well-controlled, continue amlodipine, atenolol, benazepril.  Check a BMP High cholesterol: On atorvastatin, checking a FLP AST ALT. Lower extremity edema: Recommend low-salt diet, leg elevation and cont. w/ compression stockings.  Checking a TSH.  To let me know if he gets worse.  Of note is, he is on amlodipine. CAD: Asymptomatic. HOH: Request a referral to audiology, Dr. Fransisco Hertz. Acute lumbalgia: See LOV November 2024, symptoms resolved. RTC 6 months

## 2023-10-13 NOTE — Progress Notes (Signed)
 Subjective:    Patient ID: Dustin Ashley, male    DOB: Aug 18, 1940, 83 y.o.   MRN: [id number]  DOS:  10/13/2023 Type of visit - description: CPX  Here for CPX Chronic medical problems addressed. In general feeling well. Denies chest pain, difficulty breathing or DOE. Has noted some lower extremity edema, on and off, mostly around the ankles.  Uses compression stockings.  Review of Systems  Other than above, a 14 point review of systems is negative       Past Medical History:  Diagnosis Date   Allergic rhinitis    Allergy    seasonal   Arthritis    Bell palsy    CAD (coronary artery disease)    Cataract    Chronic renal insufficiency, stage II (mild)    Cr ~ 1.4, u/s 4-12 normal kidneys   Gallstone pancreatitis 2015   GERD (gastroesophageal reflux disease)    HOH (hard of hearing)    has a hearing aid L, sees audiology rountinely   Hyperglycemia    A1C 5.8 04-2010   Hyperlipemia    Hypertension    Pain, joint, multiple sites    uses valium, occ uses for insomnia. uses for shoulder  pain   Paroxysmal atrial fibrillation (HCC)    Personal history of colonic polyps    Pulmonary emboli (HCC)    02-2014   RAS (renal artery stenosis) (HCC) 05-2012    Renal insufficiency    SCC (squamous cell carcinoma)     Past Surgical History:  Procedure Laterality Date   CARDIAC CATHETERIZATION     CATARACT EXTRACTION     CHOLECYSTECTOMY N/A 02/15/2014   Procedure: LAPAROSCOPIC CHOLECYSTECTOMY with IOC;  Surgeon: Atilano Ina, MD;  Location: [facility] OR;  Service: General;  Laterality: N/A;   COLONOSCOPY     CORONARY ARTERY BYPASS GRAFT  1995   ESOPHAGOGASTRODUODENOSCOPY N/A 03/02/2014   Procedure: ESOPHAGOGASTRODUODENOSCOPY (EGD);  Surgeon: Meryl Dare, MD;  Location: [facility] ENDOSCOPY;  Service: Endoscopy;  Laterality: N/A;  sarah/leone   Social History   Socioeconomic History   Marital status: Widowed    Spouse name: Not on file   Number of children: 1   Years of education:  Not on file   Highest education level: Not on file  Occupational History   Occupation: retired, delivery truck driver  Tobacco Use   Smoking status: Former    Current packs/day: 0.00    Types: Cigarettes    Quit date: 03/13/1976    Years since quitting: 47.6   Smokeless tobacco: Never  Vaping Use   Vaping status: Never Used  Substance and Sexual Activity   Alcohol use: No    Alcohol/week: 0.0 standard drinks of alcohol   Drug use: No   Sexual activity: Not on file  Other Topics Concern   Not on file  Social History Narrative   Born in [place], has a large extended family, oldest of several brothers-sister    Son anf G son live near by    Lost wife 2019. Lives by himself    Social Drivers of Health   Financial Resource Strain: Low Risk  (04/08/2022)   Overall Financial Resource Strain (CARDIA)    Difficulty of Paying Living Expenses: Not hard at all  Food Insecurity: No Food Insecurity (04/08/2022)   Hunger Vital Sign    Worried About Running Out of Food in the Last Year: Never true    Ran Out of Food in the Last Year: Never true  Transportation Needs: No Transportation Needs (04/08/2022)   PRAPARE - Administrator, Civil Service (Medical): No    Lack of Transportation (Non-Medical): No  Physical Activity: Inactive (04/08/2022)   Exercise Vital Sign    Days of Exercise per Week: 0 days    Minutes of Exercise per Session: 0 min  Stress: No Stress Concern Present (04/08/2022)   [employer] of Occupational Health - Occupational Stress Questionnaire    Feeling of Stress : Not at all  Social Connections: Moderately Isolated (04/08/2022)   Social Connection and Isolation Panel [NHANES]    Frequency of Communication with Friends and Family: More than three times a week    Frequency of Social Gatherings with Friends and Family: More than three times a week    Attends Religious Services: More than 4 times per year    Active Member of [employer] or Organizations: No     Attends Banker Meetings: Never    Marital Status: Widowed  Intimate Partner Violence: Not At Risk (04/08/2022)   Humiliation, Afraid, Rape, and Kick questionnaire    Fear of Current or Ex-Partner: No    Emotionally Abused: No    Physically Abused: No    Sexually Abused: No    Current Outpatient Medications  Medication Instructions   amLODipine (NORVASC) 10 mg, Oral, Daily   aspirin 81 mg, Daily   atenolol (TENORMIN) 75 mg, Oral, Daily   atorvastatin (LIPITOR) 20 mg, Oral, Daily   benazepril (LOTENSIN) 20 mg, Oral, Daily   diazepam (VALIUM) 10 mg, Oral, Every 12 hours PRN   docusate sodium (COLACE) 100 mg, Daily PRN   fexofenadine (ALLEGRA) 180 mg, Daily       Objective:   Physical Exam BP 136/70 (BP Location: Left Arm, Patient Position: Sitting, Cuff Size: Normal)   Pulse (!) 55   Temp 98.2 F (36.8 C) (Oral)   Resp 12   Ht 5\' 9"  (1.753 m)   Wt 196 lb 9.6 oz (89.2 kg)   SpO2 94%   BMI 29.03 kg/m  General: Well developed, NAD, BMI noted Neck: No  thyromegaly  HEENT:  Normocephalic . Face symmetric, atraumatic Lungs:  CTA B Normal respiratory effort, no intercostal retractions, no accessory muscle use. Heart: RRR,  no murmur.  Abdomen:  Not distended, soft, non-tender. No rebound or rigidity.   Lower extremities: Mild pretibial edema, more noticeable peri-ankle edema bilaterally  skin: Exposed areas without rash. Not pale. Not jaundice Neurologic:  alert & oriented X3.  Speech normal, gait appropriate for age and unassisted Strength symmetric and appropriate for age.  Psych: Cognition and judgment appear intact.  Cooperative with normal attention span and concentration.  Behavior appropriate. No anxious or depressed appearing.     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 fall -CCS, prostate cancer screening: No  further, see previous entries -Labs: See orders -Recommended healthy diet with low salt.  Plans to get more active and going back to the [facility] Other issues addressed. HTN: BP well-controlled, continue amlodipine, atenolol, benazepril.  Check a BMP High cholesterol: On atorvastatin, checking a FLP AST ALT. Lower extremity edema: Recommend low-salt diet, leg elevation and cont. w/ compression stockings.  Checking a TSH.  To let me know if he gets worse.  Of note is, he is on amlodipine. CAD: Asymptomatic. HOH: Request a referral to audiology, Dr. Fransisco Hertz. Acute lumbalgia: See LOV November 2024, symptoms resolved. RTC 6 months

## 2023-10-14 NOTE — Addendum Note (Signed)
 Addended byConrad [place] D on: 10/14/2023 08:04 AM   Modules accepted: Orders

## 2023-10-15 LAB — DM TEMPLATE

## 2023-10-16 LAB — DRUG MONITORING PANEL 375977 , URINE
Alphahydroxyalprazolam: NEGATIVE ng/mL (ref <25)
Alphahydroxymidazolam: NEGATIVE ng/mL (ref <50)
Alphahydroxytriazolam: NEGATIVE ng/mL (ref <50)
Aminoclonazepam: NEGATIVE ng/mL (ref <25)
Barbiturates: NEGATIVE ng/mL (ref <300)
Benzodiazepines: POSITIVE ng/mL — AB (ref <100)
Cocaine Metabolite: NEGATIVE ng/mL (ref <150)
Desmethyltramadol: NEGATIVE ng/mL (ref <100)
Hydroxyethylflurazepam: NEGATIVE ng/mL (ref <50)
Lorazepam: NEGATIVE ng/mL (ref <50)
Marijuana Metabolite: NEGATIVE ng/mL (ref <20)
Nordiazepam: 755 ng/mL — ABNORMAL HIGH (ref <50)
Opiates: NEGATIVE ng/mL (ref <100)
Oxazepam: 1153 ng/mL — ABNORMAL HIGH (ref <50)
Oxycodone: NEGATIVE ng/mL (ref <100)
Temazepam: 885 ng/mL — ABNORMAL HIGH (ref <50)
Tramadol Comments: NEGATIVE ng/mL (ref <500)
Tramadol: NEGATIVE ng/mL (ref <100)
Tramadol: NEGATIVE ng/mL (ref <500)
medMATCH Summary: NEGATIVE ng/mL (ref <100)

## 2023-10-16 LAB — DM TEMPLATE

## 2023-10-28 ENCOUNTER — Telehealth: Payer: Self-pay

## 2023-10-28 NOTE — Telephone Encounter (Signed)
 Spoke w/ Pt- informed Pt of PCP recommendations. Pt states he probably doesn't drink enough water, he does drink plenty of tea and coffee. Pt states he will increase water intake.

## 2023-10-28 NOTE — Telephone Encounter (Signed)
 Copied from CRM [id number]. Topic: Clinical - Lab/Test Results >> Oct 28, 2023  9:00 AM Marlan Silva wrote: Reason for CRM: Patient called stating that his results came in and he wants to go over them with a provider. Patent is requesting a call back to the number on file. He had specific questions about his "GFR" results.

## 2023-10-28 NOTE — Telephone Encounter (Signed)
 Advise patient: although the GFR is slightly lower than before, it varies over time thus I consider his kidney function to be stable. . Recommend to continue present care, be sure he drinks plenty of fluids and avoids any type of NSAIDs. If so desired, arrange BMP in 2 months so we can recheck his kidney function before the next visit.  DX CKD

## 2023-11-05 ENCOUNTER — Ambulatory Visit (INDEPENDENT_AMBULATORY_CARE_PROVIDER_SITE_OTHER): Admitting: Internal Medicine

## 2023-11-05 ENCOUNTER — Encounter: Payer: Self-pay | Admitting: Internal Medicine

## 2023-11-05 VITALS — BP 126/80 | HR 56 | Temp 97.6°F | Resp 18 | Ht 69.0 in | Wt 196.0 lb

## 2023-11-05 DIAGNOSIS — J301 Allergic rhinitis due to pollen: Secondary | ICD-10-CM | POA: Diagnosis not present

## 2023-11-05 MED ORDER — BENZONATATE 100 MG PO CAPS: 100.0000 mg | ORAL_CAPSULE | Freq: Three times a day (TID) | ORAL | 0 refills | Status: DC | PRN

## 2023-11-05 NOTE — Progress Notes (Unsigned)
 Subjective:    Patient ID: Dustin Ashley, male    DOB: 1941-06-16, 83 y.o.   MRN: [id number]  DOS:  11/05/2023 Type of visit - description: Acute  On and off symptoms for the last 2 weeks: Sinus and chest congestion, cough mostly dry. Denies any fever or chills. He took Robitussin and he "felt funny", confused?  Denies any LOC, slurred speech or diplopia.  Symptoms quickly resolved. Mild sneezing also noted. Took some penicillin leftover and felt temporarily better Review of Systems See above   Past Medical History:  Diagnosis Date   Allergic rhinitis    Allergy    seasonal   Arthritis    Bell palsy    CAD (coronary artery disease)    Cataract    Chronic renal insufficiency, stage II (mild)    Cr ~ 1.4, u/s 4-12 normal kidneys   Gallstone pancreatitis 2015   GERD (gastroesophageal reflux disease)    HOH (hard of hearing)    has a hearing aid L, sees audiology rountinely   Hyperglycemia    A1C 5.8 04-2010   Hyperlipemia    Hypertension    Pain, joint, multiple sites    uses valium , occ uses for insomnia. uses for shoulder  pain   Paroxysmal atrial fibrillation (HCC)    Personal history of colonic polyps    Pulmonary emboli (HCC)    02-2014   RAS (renal artery stenosis) (HCC) 05-2012    Renal insufficiency    SCC (squamous cell carcinoma)     Past Surgical History:  Procedure Laterality Date   CARDIAC CATHETERIZATION     CATARACT EXTRACTION     CHOLECYSTECTOMY N/A 02/15/2014   Procedure: LAPAROSCOPIC CHOLECYSTECTOMY with IOC;  Surgeon: Fran Imus, MD;  Location: [facility] OR;  Service: General;  Laterality: N/A;   COLONOSCOPY     CORONARY ARTERY BYPASS GRAFT  1995   ESOPHAGOGASTRODUODENOSCOPY N/A 03/02/2014   Procedure: ESOPHAGOGASTRODUODENOSCOPY (EGD);  Surgeon: Asencion Blacksmith, MD;  Location: [facility] ENDOSCOPY;  Service: Endoscopy;  Laterality: N/A;  sarah/leone    Current Outpatient Medications  Medication Instructions   amLODipine  (NORVASC ) 10 mg, Oral, Daily    aspirin  81 mg, Daily   atenolol  (TENORMIN ) 75 mg, Oral, Daily   atorvastatin  (LIPITOR) 20 mg, Oral, Daily   benazepril  (LOTENSIN ) 20 mg, Oral, Daily   diazepam  (VALIUM ) 10 mg, Oral, Every 12 hours PRN   docusate sodium  (COLACE) 100 mg, Daily PRN   fexofenadine (ALLEGRA) 180 mg, Daily       Objective:   Physical Exam BP 126/80   Pulse (!) 56   Temp 97.6 F (36.4 C) (Oral)   Resp 18   Ht 5\' 9"  (1.753 m)   Wt 196 lb (88.9 kg)   SpO2 96%   BMI 28.94 kg/m  General:   Well developed, NAD, BMI noted. HEENT:  Normocephalic . Face symmetric, atraumatic Nose slightly congested, sinuses no TTP Lungs:  CTA B Normal respiratory effort, no intercostal retractions, no accessory muscle use. Heart: RRR,  no murmur.  Lower extremities: no pretibial edema bilaterally  Skin: Not pale. Not jaundice Neurologic:  alert & oriented X3.  Speech normal, gait appropriate for age and unassisted Psych--  Cognition and judgment appear intact.  Cooperative with normal attention span and concentration.  Behavior appropriate. No anxious or depressed appearing.      Assessment   Assessment Prediabetes HTN Hyperlipidemia GERD, h/o Candida esophagitis 02-2014, nexium  prn Insomnia, on diazepam  MSK  neck pain, back pain, diazepam   prn CKD  Renal us  wnl 2012 CV: --CAD, CABG 1995 --Paroxysmal atrial fibrillation; (-) 3 week monitor ~07-2014  , not anticoag   --Pulmonary emboli , DVT, provoked 02-2014 --RAS dx 2013 --carotid disease:< 40% B 04-2022, med mngmt  ED  DERM: sees derm regulalrly HOH-- has aids H/o Bell palsy  H/ o gallbladder pancreatitis H/o Low testosterone  11-2014  PLAN: Allergic rhinitis, sinusitis: Suspect symptoms are related to allergies. Continue Allegra, avoid Robitussin as it caused side effects. Cough controlled with Tessalon  Perles. Also Astepro Flonase OTC. Call if not gradually better.  See AVS.

## 2023-11-05 NOTE — Patient Instructions (Addendum)
 Continue Allegra 180 mg 1 tablet daily  For cough: Benzonatate  100 mg 1 tablet 3 times a day as needed  For nasal allergies: Flonase over-the-counter: 2 sprays on each side of the nose once daily until better Astepro over-the-counter: 2 sprays on each side of the nose twice daily until better  Call if not improving gradually.  Avoid outdoors (polen) as much as you can

## 2023-11-07 NOTE — Assessment & Plan Note (Signed)
 Allergic rhinitis, sinusitis: Suspect symptoms are related to allergies. Continue Allegra, avoid Robitussin as it caused side effects. Cough controlled with Tessalon  Perles. Also Astepro Flonase OTC. Call if not gradually better.  See AVS.

## 2023-12-09 ENCOUNTER — Ambulatory Visit (INDEPENDENT_AMBULATORY_CARE_PROVIDER_SITE_OTHER): Admitting: Internal Medicine

## 2023-12-09 ENCOUNTER — Encounter: Payer: Self-pay | Admitting: Internal Medicine

## 2023-12-09 VITALS — BP 136/68 | HR 58 | Temp 97.7°F | Resp 16 | Ht 69.0 in | Wt 195.4 lb

## 2023-12-09 DIAGNOSIS — M542 Cervicalgia: Secondary | ICD-10-CM

## 2023-12-09 MED ORDER — PREDNISONE 10 MG PO TABS: ORAL_TABLET | ORAL | 0 refills | Status: DC

## 2023-12-09 NOTE — Assessment & Plan Note (Signed)
 Neck pain: Episodic for the last 10 years, previous  flareup was more than a year ago.  No radiculopathy type of symptoms. Typically respond well to prednisone . Plan: Tylenol , heating pad, prednisone .  Avoid NSAIDs If not gradually better he will let me know for further evaluation and treatment.

## 2023-12-09 NOTE — Patient Instructions (Signed)
 Tylenol   500 mg OTC 2 tabs a day every 8 hours as needed for pain  Heating pad  Prednisone   Avoid with anti-inflammatories such as ibuprofen, naproxen.  Call if not gradually better

## 2023-12-09 NOTE — Progress Notes (Signed)
 Subjective:    Patient ID: Dustin Ashley, male    DOB: 26-Oct-1940, 83 y.o.   MRN: [id number]  DOS:  12/09/2023 Type of visit - description: Acute  Has a long history of episodic neck pain, this time the flareup started approximately 5 days ago. The pain is located at the back of the neck, no radiation, described as shooting in nature. It decreases when he puts his finger on top of the area, also decreases with certain neck movements. Denies any paresthesias   Review of Systems See above   Past Medical History:  Diagnosis Date   Allergic rhinitis    Allergy    seasonal   Arthritis    Bell palsy    CAD (coronary artery disease)    Cataract    Chronic renal insufficiency, stage II (mild)    Cr ~ 1.4, u/s 4-12 normal kidneys   Gallstone pancreatitis 2015   GERD (gastroesophageal reflux disease)    HOH (hard of hearing)    has a hearing aid L, sees audiology rountinely   Hyperglycemia    A1C 5.8 04-2010   Hyperlipemia    Hypertension    Pain, joint, multiple sites    uses valium , occ uses for insomnia. uses for shoulder  pain   Paroxysmal atrial fibrillation (HCC)    Personal history of colonic polyps    Pulmonary emboli (HCC)    02-2014   RAS (renal artery stenosis) (HCC) 05-2012    Renal insufficiency    SCC (squamous cell carcinoma)     Past Surgical History:  Procedure Laterality Date   CARDIAC CATHETERIZATION     CATARACT EXTRACTION     CHOLECYSTECTOMY N/A 02/15/2014   Procedure: LAPAROSCOPIC CHOLECYSTECTOMY with IOC;  Surgeon: Fran Imus, MD;  Location: [facility] OR;  Service: General;  Laterality: N/A;   COLONOSCOPY     CORONARY ARTERY BYPASS GRAFT  1995   ESOPHAGOGASTRODUODENOSCOPY N/A 03/02/2014   Procedure: ESOPHAGOGASTRODUODENOSCOPY (EGD);  Surgeon: Asencion Blacksmith, MD;  Location: [facility] ENDOSCOPY;  Service: Endoscopy;  Laterality: N/A;  sarah/leone    Current Outpatient Medications  Medication Instructions   amLODipine  (NORVASC ) 10 mg, Oral, Daily   aspirin   81 mg, Daily   atenolol  (TENORMIN ) 75 mg, Oral, Daily   atorvastatin  (LIPITOR) 20 mg, Oral, Daily   benazepril  (LOTENSIN ) 20 mg, Oral, Daily   benzonatate  (TESSALON ) 100 mg, Oral, 3 times daily PRN   diazepam  (VALIUM ) 10 mg, Oral, Every 12 hours PRN   docusate sodium  (COLACE) 100 mg, Daily PRN   fexofenadine (ALLEGRA) 180 mg, Daily       Objective:   Physical Exam Neck:     BP 136/68   Pulse (!) 58   Temp 97.7 F (36.5 C) (Oral)   Resp 16   Ht 5\' 9"  (1.753 m)   Wt 195 lb 6 oz (88.6 kg)   SpO2 97%   BMI 28.85 kg/m  General:   Well developed, NAD, BMI noted. Neck: No TTP at the cervical spine. No TTP at the area where pain is located Neck ROM slightly limited, unable to hyperextend but otherwise seems okay. Skin: Not pale. Not jaundice Neurologic:  alert & oriented X3.  Speech normal, gait appropriate for age and unassisted Psych--  Cognition and judgment appear intact.  Cooperative with normal attention span and concentration.  Behavior appropriate. No anxious or depressed appearing.      Assessment   Assessment Prediabetes HTN Hyperlipidemia GERD, h/o Candida esophagitis 02-2014, nexium  prn Insomnia,  on diazepam  MSK  neck pain, back pain, diazepam  prn CKD  Renal us  wnl 2012 CV: --CAD, CABG 1995 --Paroxysmal atrial fibrillation; (-) 3 week monitor ~07-2014  , not anticoag   --Pulmonary emboli , DVT, provoked 02-2014 --RAS dx 2013 --carotid disease:< 40% B 04-2022, med mngmt  ED  DERM: sees derm regulalrly HOH-- has aids H/o Bell palsy  H/ o gallbladder pancreatitis H/o Low testosterone  11-2014  PLAN: Neck pain: Episodic for the last 10 years, previous  flareup was more than a year ago.  No radiculopathy type of symptoms. Typically respond well to prednisone . Plan: Tylenol , heating pad, prednisone .  Avoid NSAIDs If not gradually better he will let me know for further evaluation and treatment.

## 2024-01-03 ENCOUNTER — Other Ambulatory Visit: Payer: Self-pay | Admitting: Internal Medicine

## 2024-02-09 ENCOUNTER — Other Ambulatory Visit: Payer: Self-pay | Admitting: Internal Medicine

## 2024-02-09 NOTE — Telephone Encounter (Signed)
 Requesting: diazepam  10mg   Contract: 10/08/22 UDS: 10/13/23 Last Visit: 12/09/23 Next Visit: 04/12/24 Last Refill: 06/17/23 #60 and 3RF   Please Advise

## 2024-02-12 ENCOUNTER — Ambulatory Visit: Payer: Self-pay | Admitting: Medical

## 2024-02-12 ENCOUNTER — Telehealth: Payer: Self-pay

## 2024-02-12 ENCOUNTER — Telehealth: Payer: Self-pay | Admitting: Internal Medicine

## 2024-02-12 ENCOUNTER — Ambulatory Visit: Payer: Self-pay

## 2024-02-12 ENCOUNTER — Ambulatory Visit: Admitting: Medical

## 2024-02-12 ENCOUNTER — Encounter: Payer: Self-pay | Admitting: Medical

## 2024-02-12 ENCOUNTER — Other Ambulatory Visit: Payer: Self-pay

## 2024-02-12 ENCOUNTER — Ambulatory Visit (HOSPITAL_BASED_OUTPATIENT_CLINIC_OR_DEPARTMENT_OTHER)
Admission: RE | Admit: 2024-02-12 | Discharge: 2024-02-12 | Disposition: A | Discharge status: Home / Self Care | Source: Ambulatory Visit | Attending: Medical | Admitting: Medical

## 2024-02-12 VITALS — BP 132/75 | HR 68 | Resp 16 | Ht 69.0 in | Wt 194.8 lb

## 2024-02-12 DIAGNOSIS — R5383 Other fatigue: Secondary | ICD-10-CM

## 2024-02-12 DIAGNOSIS — I10 Essential (primary) hypertension: Secondary | ICD-10-CM | POA: Diagnosis not present

## 2024-02-12 DIAGNOSIS — R55 Syncope and collapse: Secondary | ICD-10-CM | POA: Insufficient documentation

## 2024-02-12 DIAGNOSIS — I4891 Unspecified atrial fibrillation: Secondary | ICD-10-CM

## 2024-02-12 DIAGNOSIS — I6782 Cerebral ischemia: Secondary | ICD-10-CM | POA: Diagnosis not present

## 2024-02-12 DIAGNOSIS — R7303 Prediabetes: Secondary | ICD-10-CM | POA: Diagnosis not present

## 2024-02-12 LAB — COMPREHENSIVE METABOLIC PANEL WITH GFR
ALT: 15 U/L (ref 0–53)
AST: 15 U/L (ref 0–37)
Albumin: 4.2 g/dL (ref 3.5–5.2)
Alkaline Phosphatase: 97 U/L (ref 39–117)
BUN: 27 mg/dL — ABNORMAL HIGH (ref 6–23)
CO2: 28 meq/L (ref 19–32)
Calcium: 9.5 mg/dL (ref 8.4–10.5)
Chloride: 105 meq/L (ref 96–112)
Creatinine, Ser: 1.4 mg/dL (ref 0.40–1.50)
GFR: 46.55 mL/min — ABNORMAL LOW (ref >60.00)
Glucose, Bld: 100 mg/dL — ABNORMAL HIGH (ref 70–99)
Potassium: 4.4 meq/L (ref 3.5–5.1)
Sodium: 142 meq/L (ref 135–145)
Total Bilirubin: 0.8 mg/dL (ref 0.2–1.2)
Total Protein: 6.9 g/dL (ref 6.0–8.3)

## 2024-02-12 LAB — CBC WITH DIFFERENTIAL/PLATELET
Basophils Absolute: 0.1 K/uL (ref 0.0–0.1)
Basophils Relative: 0.7 % (ref 0.0–3.0)
Eosinophils Absolute: 0.4 K/uL (ref 0.0–0.7)
Eosinophils Relative: 3.8 % (ref 0.0–5.0)
HCT: 48.8 % (ref 39.0–52.0)
Hemoglobin: 16.2 g/dL (ref 13.0–17.0)
Lymphocytes Relative: 25.3 % (ref 12.0–46.0)
Lymphs Abs: 2.8 K/uL (ref 0.7–4.0)
MCHC: 33.1 g/dL (ref 30.0–36.0)
MCV: 87 fl (ref 78.0–100.0)
Monocytes Absolute: 1 K/uL (ref 0.1–1.0)
Monocytes Relative: 9.2 % (ref 3.0–12.0)
Neutro Abs: 6.8 K/uL (ref 1.4–7.7)
Neutrophils Relative %: 61 % (ref 43.0–77.0)
Platelets: 174 K/uL (ref 150.0–400.0)
RBC: 5.61 Mil/uL (ref 4.22–5.81)
RDW: 13.8 % (ref 11.5–15.5)
WBC: 11.2 K/uL — ABNORMAL HIGH (ref 4.0–10.5)

## 2024-02-12 LAB — TSH: TSH: 1.93 u[IU]/mL (ref 0.35–5.50)

## 2024-02-12 LAB — T4, FREE: Free T4: 0.93 ng/dL (ref 0.60–1.60)

## 2024-02-12 LAB — VITAMIN B12: Vitamin B-12: 214 pg/mL (ref 211–911)

## 2024-02-12 MED ORDER — APIXABAN 2.5 MG PO TABS: 2.5000 mg | ORAL_TABLET | Freq: Two times a day (BID) | ORAL | 2 refills | Status: DC

## 2024-02-12 NOTE — Telephone Encounter (Signed)
 Per Dallas Maxwell PAC 4 boxes of eliquis  2.5 left for pt up front for pick up

## 2024-02-12 NOTE — Telephone Encounter (Signed)
 Called pt to triage based on his sxs for his appt 7/31. Vm was not set up, unable to triage.

## 2024-02-12 NOTE — Telephone Encounter (Signed)
 FYI Only or Action Required?: FYI only for provider.  Patient was last seen in primary care on 12/09/2023 by Dustin Aloysius BRAVO, MD.  Called Nurse Triage reporting Dizziness and Near Syncope.  Symptoms began several months ago.  Interventions attempted: Nothing.  Symptoms are: episodes of brief lightheadedness (feels all over weakness) lasts a few seconds; resolved at this time.  Triage Disposition: See PCP When Office is Open (Within 3 Days)  Patient/caregiver understands and will follow disposition?: Yes                   Copied from CRM #[id number]. Topic: Clinical - Red Word Triage >> Feb 12, 2024  8:50 AM [place] A wrote: [facility] that prompted transfer to Nurse Triage: Patient's daughter in law Marquavis Hannen) called patient is experiencing blacking out. Reason for Disposition  [1] MILD dizziness (e.g., walking normally) AND [2] has NOT been evaluated by doctor (or NP/PA) for this  (Exception: Dizziness caused by heat exposure, sudden standing, or poor fluid intake.)  Answer Assessment - Initial Assessment Questions Daugther in law, Apolinar, on phone for triage. She states she is not with the patient but will call him and give him a heads up that triage RN will be calling (due to daughter in law unsure of some assessment questions).  1. DESCRIPTION: Describe your dizziness.     Patient blacks out or goes blank, denies LOC. Lightheaded. Lasts 5-10 seconds, starts at head and goes down his body. Patient states it feels like everything in my body drains out. Denies any seizure like activity.  2. LIGHTHEADED: Do you feel lightheaded? (e.g., somewhat faint, woozy, weak upon standing)     Yes.  3. VERTIGO: Do you feel like either you or the room is spinning or tilting? (i.e., vertigo)     No.  4. SEVERITY: How bad is it?  Do you feel like you are going to faint? Can you stand and walk?     Not present today. Patient states seated until the feeling passes, he does  not try to get up. No syncopal episodes.  5. ONSET:  When did the dizziness begin?     About 6-7 months ago was first episode, 2 nights ago second episode.  6. AGGRAVATING FACTORS: Does anything make it worse? (e.g., standing, change in head position)     Nothing specifically that brings it on or makes it worse. First episode was while driving, second episode was while sitting at the computer. Patient states the episode that happened on Tuesday, before that he had 2 [employer] sweet tea.  7. HEART RATE: Can you tell me your heart rate? How many beats in 15 seconds?  (Note: Not all patients can do this.)       Unsure.  8. CAUSE: What do you think is causing the dizziness? (e.g., decreased fluids or food, diarrhea, emotional distress, heat exposure, new medicine, sudden standing, vomiting; unknown)     Patient's back pain has been bothering him lately so he has not been working out at [employer] as often, unsure if that is affecting him.  9. RECURRENT SYMPTOM: Have you had dizziness before? If Yes, ask: When was the last time? What happened that time?     No.  10. OTHER SYMPTOMS: Do you have any other symptoms? (e.g., fever, chest pain, vomiting, diarrhea, bleeding)       Family reports they last saw him yesterday, denies: facial droop, unilateral numbness or weakness, changes in speech or vision, chest  pain, SOB, nausea, vomiting, diarrhea.  11. PREGNANCY: Is there any chance you are pregnant? When was your last menstrual period?       N/A.  Protocols used: Dizziness - Lightheadedness-A-AH

## 2024-02-12 NOTE — Progress Notes (Signed)
 Subjective:    Patient ID: Dustin Ashley, male    DOB: 23-Jun-1941, 83 y.o.   MRN: [id number]  HPI [name] is an 83 year old male who presents with episodes of syncope.  He experienced an episode of syncope two days ago while sitting in front of his computer. He describes a sensation starting at the top of his head and moving down his body, feeling as though 'all my blood and everything just drains out of me.' He did not experience tingling or shakiness but felt lightheaded. He found himself on the floor, having moved from his chair, and estimates he was unconscious for only a few seconds. No palpitations, nausea, vomiting, or muscle soreness followed the episode. No cardiac symptoms reported prior to episode.  He recalls a similar episode occurring six or seven months ago while driving, which he attributes to taking a medication ucinex that did not agree with him. He was able to pull over before losing consciousness. He has experienced a total of 3 near syncope  episodes in his life,. 2 full syncope.   He underwent open heart surgery 33 years ago and has experienced similar sensations three times since then, though he did not lose consciousness during those events. His blood pressure is typically high during medical visits, according to the patient, but he reports it returns to normal afterward. His blood sugar levels have been in the prediabetic range for years.  He has not been exercising as regularly due to a back injury sustained six weeks ago, which has led to decreased energy levels. He typically exercises at the [facility] two to three times a week but has not been able to do so since the injury.  He reports left knee pain that began about two months ago, characterized by nighttime throbbing on the side of the knee. He wears a brace that alleviates the pain, though it returns when the brace is not used.  Pt has hx of PAF on chart review but he was never aware of this.   Review of  Systems  Constitutional:  Negative for chills, fatigue and fever.  HENT:  Negative for congestion.   Eyes:  Negative for photophobia and visual disturbance.  Respiratory:  Negative for cough, chest tightness, shortness of breath and wheezing.   Cardiovascular:  Negative for chest pain and palpitations.  Gastrointestinal:  Negative for abdominal pain, diarrhea, nausea and vomiting.  Musculoskeletal:  Negative for back pain, myalgias and neck stiffness.  Skin:  Negative for rash.  Neurological:  Negative for dizziness, seizures, syncope, speech difficulty, weakness, light-headedness and headaches.       No signs/symptoms during exam. But see hpi  Hematological:  Negative for adenopathy.  Psychiatric/Behavioral:  Negative for behavioral problems, decreased concentration and dysphoric mood. The patient is not nervous/anxious.     Past Medical History:  Diagnosis Date   Allergic rhinitis    Allergy    seasonal   Arthritis    Bell palsy    CAD (coronary artery disease)    Cataract    Chronic renal insufficiency, stage II (mild)    Cr ~ 1.4, u/s 4-12 normal kidneys   Gallstone pancreatitis 2015   GERD (gastroesophageal reflux disease)    HOH (hard of hearing)    has a hearing aid L, sees audiology rountinely   Hyperglycemia    A1C 5.8 04-2010   Hyperlipemia    Hypertension    Pain, joint, multiple sites    uses valium , occ  uses for insomnia. uses for shoulder  pain   Paroxysmal atrial fibrillation [facility])    Personal history of colonic polyps    Pulmonary emboli (HCC)    02-2014   RAS (renal artery stenosis) (HCC) 05-2012    Renal insufficiency    SCC (squamous cell carcinoma)      Social History   Socioeconomic History   Marital status: Widowed    Spouse name: Not on file   Number of children: 1   Years of education: Not on file   Highest education level: Not on file  Occupational History   Occupation: retired, delivery truck driver  Tobacco Use   Smoking status: Former     Current packs/day: 0.00    Types: Cigarettes    Quit date: 03/13/1976    Years since quitting: 47.9   Smokeless tobacco: Never  Vaping Use   Vaping status: Never Used  Substance and Sexual Activity   Alcohol use: No    Alcohol/week: 0.0 standard drinks of alcohol   Drug use: No   Sexual activity: Not on file  Other Topics Concern   Not on file  Social History Narrative   Born in [place], has a large extended family, oldest of several brothers-sister    Son anf G son live near by    Lost wife 2019. Lives by himself    Social Drivers of Health   Financial Resource Strain: Low Risk  (04/08/2022)   Overall Financial Resource Strain (CARDIA)    Difficulty of Paying Living Expenses: Not hard at all  Food Insecurity: No Food Insecurity (04/08/2022)   Hunger Vital Sign    Worried About Running Out of Food in the Last Year: Never true    Ran Out of Food in the Last Year: Never true  Transportation Needs: No Transportation Needs (04/08/2022)   PRAPARE - Administrator, Civil Service (Medical): No    Lack of Transportation (Non-Medical): No  Physical Activity: Inactive (04/08/2022)   Exercise Vital Sign    Days of Exercise per Week: 0 days    Minutes of Exercise per Session: 0 min  Stress: No Stress Concern Present (04/08/2022)   [employer] of Occupational Health - Occupational Stress Questionnaire    Feeling of Stress : Not at all  Social Connections: Moderately Isolated (04/08/2022)   Social Connection and Isolation Panel    Frequency of Communication with Friends and Family: More than three times a week    Frequency of Social Gatherings with Friends and Family: More than three times a week    Attends Religious Services: More than 4 times per year    Active Member of [employer] or Organizations: No    Attends Banker Meetings: Never    Marital Status: Widowed  Intimate Partner Violence: Not At Risk (04/08/2022)   Humiliation, Afraid, Rape, and Kick  questionnaire    Fear of Current or Ex-Partner: No    Emotionally Abused: No    Physically Abused: No    Sexually Abused: No    Past Surgical History:  Procedure Laterality Date   CARDIAC CATHETERIZATION     CATARACT EXTRACTION     CHOLECYSTECTOMY N/A 02/15/2014   Procedure: LAPAROSCOPIC CHOLECYSTECTOMY with IOC;  Surgeon: Camellia CHRISTELLA Blush, MD;  Location: [facility] OR;  Service: General;  Laterality: N/A;   COLONOSCOPY     CORONARY ARTERY BYPASS GRAFT  1995   ESOPHAGOGASTRODUODENOSCOPY N/A 03/02/2014   Procedure: ESOPHAGOGASTRODUODENOSCOPY (EGD);  Surgeon: Gwendlyn ONEIDA Buddy, MD;  Location: [facility] ENDOSCOPY;  Service: Endoscopy;  Laterality: N/A;  sarah/leone    Family History  Problem Relation Age of Onset   Hypertension Father    Heart attack Brother        2 brothers, CABG   Diabetes Neg Hx    Colon cancer Neg Hx    Prostate cancer Neg Hx        colon, prostate   Rectal cancer Neg Hx    Stomach cancer Neg Hx     Allergies  Allergen Reactions   Hydrocodone -Acetaminophen  Other (See Comments)    REACTION: bladder obstruction   Tramadol Hcl Other (See Comments)    REACTION: bladder obstruction    Current Outpatient Medications on File Prior to Visit  Medication Sig Dispense Refill   amLODipine  (NORVASC ) 10 MG tablet Take 1 tablet (10 mg total) by mouth daily. 90 tablet 1   atenolol  (TENORMIN ) 50 MG tablet Take 1.5 tablets (75 mg total) by mouth daily. 135 tablet 1   atorvastatin  (LIPITOR) 20 MG tablet Take 1 tablet (20 mg total) by mouth daily. 90 tablet 1   benazepril  (LOTENSIN ) 20 MG tablet Take 1 tablet (20 mg total) by mouth daily. 90 tablet 1   diazepam  (VALIUM ) 10 MG tablet TAKE 1 TABLET (10 MG TOTAL) BY MOUTH EVERY 12 (TWELVE) HOURS AS NEEDED FOR ANXIETY OR SLEEP. 60 tablet 0   docusate sodium  (COLACE) 100 MG capsule Take 100 mg by mouth daily as needed (constipation).     fexofenadine (ALLEGRA) 180 MG tablet Take 180 mg by mouth daily.     No current facility-administered  medications on file prior to visit.    BP 132/75   Pulse 68   Resp 16   Ht 5' 9 (1.753 m)   Wt 194 lb 12.8 oz (88.4 kg)   SpO2 97%   BMI 28.77 kg/m        Objective:   Physical Exam  General Mental Status- Alert. General Appearance- Not in acute distress.   Skin General: Color- Normal Color. Moisture- Normal Moisture.  Neck Carotid Arteries- Normal color. Moisture- Normal Moisture. No carotid bruits. No JVD.  Chest and Lung Exam Auscultation: Breath Sounds:-CTA  Cardiovascular Auscultation:Rythm-irregular(a fib like) Murmurs & Other Heart Sounds:Auscultation of the heart reveals- No Murmurs.  Abdomen Inspection:-Inspeection Normal. Palpation/Percussion:Note:No mass. Palpation and Percussion of the abdomen reveal- Non Tender, Non Distended + BS, no rebound or guarding.    Neurologic Cranial Nerve exam:- CN III-XII intact(No nystagmus), symmetric smile. Drift Test:- No drift. Romberg Exam:- Negative.  Heal to Toe Gait exam:-Normal. Finger to Nose:- Normal/Intact Strength:- 5/5 equal and symmetric strength both upper and lower extremities.       Assessment & Plan:   Patient Instructions  Atrial fibrillation with recent syncope(Hx of PAF on chart review) EKG shows rate-controlled atrial fibrillation. Possible/probable cause of the syncope. Risk of embolic events due to atrial fibrillation. Discussed case with Dr. Domenica. - Order CT of the head without contrast to rule out intracranial pathology/bleed secondary to syncope and he went to hard wood floor. -Decided ED referral not necessary since A fib not new and pt has normal neurologic exam. Discussed case with Dr. Domenica - Initiate anticoagulation with Eliquis  after ruling out intracranial bleed. - Refer to cardiologist for further management of atrial fibrillation. Currently rate controlled - Advise against driving until further evaluation is completed.  Htn -bp controlled  Fatigue -Decreased energy levels  possibly related to back pain and reduced exercise.  Prediabetes Blood sugar  levels in prediabetic range, recent reading 146 mg/dL postprandial.  Hypertension Elevated blood pressure during visit, typically normalizes post-visit. -on recheck did get better. -continue current bp meds.  Follow up one week with Dr Amon or with me if no availabiity. - if any recurrent syncopal event be seen in ED    Dallas Maxwell, PA-C   Time spent with patient today was  50 minutes which consisted of chart revdiew, discussing diagnosis, work up treatment and documentation.

## 2024-02-12 NOTE — Patient Instructions (Addendum)
 Atrial fibrillation with recent syncope(Hx of PAF on chart review) EKG shows rate-controlled atrial fibrillation. Possible/probable cause of the syncope. Risk of embolic events due to atrial fibrillation. Discussed case with Dr. Domenica. - Order CT of the head without contrast to rule out intracranial pathology/bleed secondary to syncope and he went to hard wood floor. -Decided ED referral not necessary since A fib not new and pt has normal neurologic exam. Discussed case with Dr. Domenica - Initiate anticoagulation with Eliquis  after ruling out intracranial bleed. - Refer to cardiologist for further management of atrial fibrillation. Currently rate controlled - Advise against driving until further evaluation is completed.  Htn -bp controlled  Fatigue -Decreased energy levels possibly related to back pain and reduced exercise.  Prediabetes Blood sugar levels in prediabetic range, recent reading 146 mg/dL postprandial.  Hypertension Elevated blood pressure during visit, typically normalizes post-visit. -on recheck did get better. -continue current bp meds.  Follow up one week with Dr Amon or with me if no availabiity. - if any recurrent syncopal event be seen in ED

## 2024-02-12 NOTE — Telephone Encounter (Signed)
 Provider able to get in contact with pt prior appointment

## 2024-02-12 NOTE — Telephone Encounter (Signed)
 Appt scheduled

## 2024-02-13 ENCOUNTER — Telehealth: Payer: Self-pay

## 2024-02-13 NOTE — Telephone Encounter (Signed)
 Copied from CRM [id number]. Topic: Clinical - Medication Question >> Feb 13, 2024  8:02 AM Thersia BROCKS wrote: Reason for CRM: Patient Daughter in law Apolinar call in regarding patient getting samples of medication yesterday in office, stated she doesn't know how he should take them and when would like for a nurse to give her a call back with this information  [phone]

## 2024-02-13 NOTE — Progress Notes (Signed)
 Spoke with pts daughter Apolinar and relayed the message  She is understanding

## 2024-02-13 NOTE — Telephone Encounter (Signed)
 Patient daughter in law return call and has been made aware of notations.

## 2024-02-13 NOTE — Telephone Encounter (Signed)
 Pt daughter in law called and lvm to return message    [facility] for e2c2 to relay message below

## 2024-02-16 LAB — VITAMIN B1: Vitamin B1 (Thiamine): 11 nmol/L (ref 8–30)

## 2024-02-18 ENCOUNTER — Other Ambulatory Visit (HOSPITAL_BASED_OUTPATIENT_CLINIC_OR_DEPARTMENT_OTHER): Payer: Self-pay

## 2024-02-18 ENCOUNTER — Ambulatory Visit (INDEPENDENT_AMBULATORY_CARE_PROVIDER_SITE_OTHER): Admitting: Medical

## 2024-02-18 ENCOUNTER — Encounter: Payer: Self-pay | Admitting: Medical

## 2024-02-18 ENCOUNTER — Other Ambulatory Visit: Payer: Self-pay

## 2024-02-18 ENCOUNTER — Telehealth: Payer: Self-pay

## 2024-02-18 VITALS — BP 150/80 | HR 57 | Temp 97.8°F | Resp 16 | Ht 69.0 in | Wt 200.4 lb

## 2024-02-18 DIAGNOSIS — E538 Deficiency of other specified B group vitamins: Secondary | ICD-10-CM

## 2024-02-18 DIAGNOSIS — I10 Essential (primary) hypertension: Secondary | ICD-10-CM

## 2024-02-18 DIAGNOSIS — M542 Cervicalgia: Secondary | ICD-10-CM | POA: Diagnosis not present

## 2024-02-18 DIAGNOSIS — I4891 Unspecified atrial fibrillation: Secondary | ICD-10-CM

## 2024-02-18 MED ORDER — APIXABAN 2.5 MG PO TABS
2.5000 mg | ORAL_TABLET | Freq: Two times a day (BID) | ORAL | 3 refills | Status: DC
  Filled 2024-02-18: qty 60, 30d supply, fill #0

## 2024-02-18 MED ORDER — METHYLPREDNISOLONE 4 MG PO TBPK
ORAL_TABLET | ORAL | 0 refills | Status: DC
  Filled 2024-02-18: qty 21, 6d supply, fill #0

## 2024-02-18 NOTE — Progress Notes (Signed)
 Subjective:    Patient ID: Dustin Ashley, male    DOB: 02/03/1941, 83 y.o.   MRN: [id number]  HPI  Last visit AVS below in .   Dustin Ashley is an 83 year old male who presents with episodes of syncope.   He experienced an episode of syncope two days ago while sitting in front of his computer. He describes a sensation starting at the top of his head and moving down his body, feeling as though 'all my blood and everything just drains out of me.' He did not experience tingling or shakiness but felt lightheaded. He found himself on the floor, having moved from his chair, and estimates he was unconscious for only a few seconds. No palpitations, nausea, vomiting, or muscle soreness followed the episode. No cardiac symptoms reported prior to episode.   He recalls a similar episode occurring six or seven months ago while driving, which he attributes to taking a medication ucinex that did not agree with him. He was able to pull over before losing consciousness. He has experienced a total of 3 near syncope  episodes in his life,. 2 full syncope.    He underwent open heart surgery 33 years ago and has experienced similar sensations three times since then, though he did not lose consciousness during those events. His blood pressure is typically high during medical visits, according to the patient, but he reports it returns to normal afterward. His blood sugar levels have been in the prediabetic range for years.   He has not been exercising as regularly due to a back injury sustained six weeks ago, which has led to decreased energy levels. He typically exercises at the [facility] two to three times a week but has not been able to do so since the injury.   He reports left knee pain that began about two months ago, characterized by nighttime throbbing on the side of the knee. He wears a brace that alleviates the pain, though it returns when the brace is not used.   Pt has hx of PAF on chart review but he  was never aware of this.  Dustin Ashley is an 83 year old male with atrial fibrillation who presents with recent syncope.  He experienced a recent episode of syncope, which may be related to his known history of atrial fibrillation. He was previously unaware of his atrial fibrillation until it was identified on an EKG. He has a history of paroxysmal atrial fibrillation, which can be intermittent but may  have become persistent.  He is on Eliquis  for atrial fibrillation management. On b blocker for htn and this appears to control his pulse rate.   He takes B12 supplements, 5000 mcg daily, after being found to have low B12 levels (214, with the lower limit being 211). He feels more energetic since starting the supplements.  He experiences neck pain, attributed to a pinched nerve from a past accident. He has been treated with steroids like cortisone in the past, and the pain affects his sleep. Degenerative changes in the cervical spine from C3 to C7 were noted on a 2022 x-ray.  He is on multiple medications for blood pressure management, including amlodipine  10 mg, Tenormin  50 mg (1.5 tablets daily), and Lotensin  20 mg daily. His blood pressure was recently recorded at 150/80, higher than his previous reading of 132/75. He notes that pain may be contributing to his elevated blood pressure.  His blood sugar has been in the prediabetic range, with  an average of 118, and he is trying to reduce his sugar intake.  No irregular or fast heartbeats. He feels fine overall.   Review of Systems  Constitutional:  Negative for chills and fatigue.  Respiratory:  Negative for cough, choking and wheezing.   Cardiovascular:  Negative for chest pain and palpitations.  Gastrointestinal:  Negative for abdominal pain and blood in stool.  Musculoskeletal:  Positive for neck pain. Negative for back pain and myalgias.  Skin:  Negative for rash.  Neurological:  Negative for dizziness, tremors, weakness and  numbness.  Hematological:  Negative for adenopathy. Does not bruise/bleed easily.  Psychiatric/Behavioral:  Negative for behavioral problems, decreased concentration and dysphoric mood.     Past Medical History:  Diagnosis Date   Allergic rhinitis    Allergy    seasonal   Arthritis    Bell palsy    CAD (coronary artery disease)    Cataract    Chronic renal insufficiency, stage II (mild)    Cr ~ 1.4, u/s 4-12 normal kidneys   Gallstone pancreatitis 2015   GERD (gastroesophageal reflux disease)    HOH (hard of hearing)    has a hearing aid L, sees audiology rountinely   Hyperglycemia    A1C 5.8 04-2010   Hyperlipemia    Hypertension    Pain, joint, multiple sites    uses valium , occ uses for insomnia. uses for shoulder  pain   Paroxysmal atrial fibrillation [facility])    Personal history of colonic polyps    Pulmonary emboli (HCC)    02-2014   RAS (renal artery stenosis) (HCC) 05-2012    Renal insufficiency    SCC (squamous cell carcinoma)      Social History   Socioeconomic History   Marital status: Widowed    Spouse name: Not on file   Number of children: 1   Years of education: Not on file   Highest education level: Not on file  Occupational History   Occupation: retired, delivery truck driver  Tobacco Use   Smoking status: Former    Current packs/day: 0.00    Types: Cigarettes    Quit date: 03/13/1976    Years since quitting: 47.9   Smokeless tobacco: Never  Vaping Use   Vaping status: Never Used  Substance and Sexual Activity   Alcohol use: No    Alcohol/week: 0.0 standard drinks of alcohol   Drug use: No   Sexual activity: Not on file  Other Topics Concern   Not on file  Social History Narrative   Born in [place], has a large extended family, oldest of several brothers-sister    Son anf G son live near by    Lost wife 2019. Lives by himself    Social Drivers of Health   Financial Resource Strain: Low Risk  (04/08/2022)   Overall Financial Resource  Strain (CARDIA)    Difficulty of Paying Living Expenses: Not hard at all  Food Insecurity: No Food Insecurity (04/08/2022)   Hunger Vital Sign    Worried About Running Out of Food in the Last Year: Never true    Ran Out of Food in the Last Year: Never true  Transportation Needs: No Transportation Needs (04/08/2022)   PRAPARE - Administrator, Civil Service (Medical): No    Lack of Transportation (Non-Medical): No  Physical Activity: Inactive (04/08/2022)   Exercise Vital Sign    Days of Exercise per Week: 0 days    Minutes of Exercise per Session: 0  min  Stress: No Stress Concern Present (04/08/2022)   [employer] of Occupational Health - Occupational Stress Questionnaire    Feeling of Stress : Not at all  Social Connections: Moderately Isolated (04/08/2022)   Social Connection and Isolation Panel    Frequency of Communication with Friends and Family: More than three times a week    Frequency of Social Gatherings with Friends and Family: More than three times a week    Attends Religious Services: More than 4 times per year    Active Member of [employer] or Organizations: No    Attends Banker Meetings: Never    Marital Status: Widowed  Intimate Partner Violence: Not At Risk (04/08/2022)   Humiliation, Afraid, Rape, and Kick questionnaire    Fear of Current or Ex-Partner: No    Emotionally Abused: No    Physically Abused: No    Sexually Abused: No    Past Surgical History:  Procedure Laterality Date   CARDIAC CATHETERIZATION     CATARACT EXTRACTION     CHOLECYSTECTOMY N/A 02/15/2014   Procedure: LAPAROSCOPIC CHOLECYSTECTOMY with IOC;  Surgeon: Camellia CHRISTELLA Blush, MD;  Location: [facility] OR;  Service: General;  Laterality: N/A;   COLONOSCOPY     CORONARY ARTERY BYPASS GRAFT  1995   ESOPHAGOGASTRODUODENOSCOPY N/A 03/02/2014   Procedure: ESOPHAGOGASTRODUODENOSCOPY (EGD);  Surgeon: Gwendlyn ONEIDA Buddy, MD;  Location: [facility] ENDOSCOPY;  Service: Endoscopy;  Laterality: N/A;   sarah/leone    Family History  Problem Relation Age of Onset   Hypertension Father    Heart attack Brother        2 brothers, CABG   Diabetes Neg Hx    Colon cancer Neg Hx    Prostate cancer Neg Hx        colon, prostate   Rectal cancer Neg Hx    Stomach cancer Neg Hx     Allergies  Allergen Reactions   Hydrocodone -Acetaminophen  Other (See Comments)    REACTION: bladder obstruction   Tramadol Hcl Other (See Comments)    REACTION: bladder obstruction    Current Outpatient Medications on File Prior to Visit  Medication Sig Dispense Refill   amLODipine  (NORVASC ) 10 MG tablet Take 1 tablet (10 mg total) by mouth daily. 90 tablet 1   atenolol  (TENORMIN ) 50 MG tablet Take 1.5 tablets (75 mg total) by mouth daily. 135 tablet 1   atorvastatin  (LIPITOR) 20 MG tablet Take 1 tablet (20 mg total) by mouth daily. 90 tablet 1   benazepril  (LOTENSIN ) 20 MG tablet Take 1 tablet (20 mg total) by mouth daily. 90 tablet 1   diazepam  (VALIUM ) 10 MG tablet TAKE 1 TABLET (10 MG TOTAL) BY MOUTH EVERY 12 (TWELVE) HOURS AS NEEDED FOR ANXIETY OR SLEEP. 60 tablet 0   docusate sodium  (COLACE) 100 MG capsule Take 100 mg by mouth daily as needed (constipation).     fexofenadine (ALLEGRA) 180 MG tablet Take 180 mg by mouth daily.     No current facility-administered medications on file prior to visit.    BP (!) 150/80   Pulse (!) 57   Temp 97.8 F (36.6 C) (Oral)   Resp 16   Ht 5' 9 (1.753 m)   Wt 200 lb 6.4 oz (90.9 kg)   SpO2 96%   BMI 29.59 kg/m          Objective:   Physical Exam  General Mental Status- Alert. General Appearance- Not in acute distress.   Skin General: Color- Normal Color. Moisture- Normal Moisture.  Neck Carotid Arteries- Normal color. Moisture- Normal Moisture. No carotid bruits. No JVD.  Chest and Lung Exam Auscultation: Breath Sounds:-Normal.  Cardiovascular Auscultation:Rythm- Regulaar. irregular Murmurs & Other Heart Sounds:Auscultation of the heart  reveals- No Murmurs.  Abdomen Inspection:-Inspeection Normal. Palpation/Percussion:Note:No mass. Palpation and Percussion of the abdomen reveal- Non Tender, Non Distended + BS, no rebound or guarding.   Neurologic Cranial Nerve exam:- CN III-XII intact(No nystagmus), symmetric smile. Strength:- 5/5 equal and symmetric strength both upper and lower extremities.        Assessment & Plan:   Patient Instructions  Atrial fibrillation with recent syncope Recent syncope likely due to atrial fibrillation. EKG confirmed atrial fibrillation. Rate controlled at 57 bpm. CHADS score indicates anticoagulation necessity due to age and hypertension. CT head normal, allowing Eliquis  initiation. - Continue Eliquis  2.5 mg twice daily. - Contact cardiologist for appointment and potential echocardiogram. - Send Eliquis  prescription to pharmacy for cost evaluation. - Investigate cost of Eliquis  with pharmacy and cardiologist. - Ensure cardiologist appointment before running out of Eliquis .  Hypertension Blood pressure elevated at 150/80 mmHg, possibly due to pain. Currently on amlodipine , Tenormin , and Lotensin . Previously 132/75 mmHg. - Monitor blood pressure at home. - Evaluate blood pressure response after pain control with Medrol . - Consider increasing Lotensin  if blood pressure remains elevated. - Discuss blood pressure management with cardiologist.  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 Previous corticosteroids effective. Medrol  chosen over prednisone  due to fewer side effects and prediabetic status. - Prescribe Medrol  4 mg six-day dose pack. - Avoid NSAIDs due to Eliquis  and kidney function. - Use Tylenol  for mild to moderate pain.  Prediabetes Blood sugar levels in prediabetic range with an average of 118 mg/dL. Previous HbA1c was 5.7%. - Continue low sugar diet. - Monitor blood  sugar levels.  Vitamin B12 deficiency Vitamin B12 level low at 214 pg/mL. Reports feeling more energetic after starting oral supplementation. - Continue oral Vitamin B12 5000 mcg daily.  Follow up with cardiologist. Referal placed please call for appt. Also recommend seeing Dr .Amon mid September/sooner than current appt end of September.   Hammad Finkler, PA-C

## 2024-02-18 NOTE — Telephone Encounter (Signed)
 Copied from CRM [id number]. Topic: General - Call Back - No Documentation >> Feb 18, 2024 11:29 AM [name] wrote: Reason for CRM: Dr.Copper >> Feb 18, 2024 11:52 AM CMA [name] wrote: Pt seen by [place]. Will defer >> Feb 18, 2024 11:33 AM [name] wrote: Patient called in, states they called Dr.Copper's Cardiologist office to schedule an appointment, but was told they don't have anything available until December 18th. Patient was advised to call primary care office and have them to call and schedule the appointment for the patient instead.. Call back number is [phone] [name]   Patient also states that the prescription for apixaban  (ELIQUIS ) 2.5 MG TABS tablet is $302

## 2024-02-18 NOTE — Patient Instructions (Signed)
 Atrial fibrillation with recent syncope Recent syncope likely due to atrial fibrillation. EKG confirmed atrial fibrillation. Rate controlled at 57 bpm. CHADS score indicates anticoagulation necessity due to age and hypertension. CT head normal, allowing Eliquis  initiation. - Continue Eliquis  2.5 mg twice daily. - Contact cardiologist for appointment and potential echocardiogram. - Send Eliquis  prescription to pharmacy for cost evaluation. - Investigate cost of Eliquis  with pharmacy and cardiologist. - Ensure cardiologist appointment before running out of Eliquis .  Hypertension Blood pressure elevated at 150/80 mmHg, possibly due to pain. Currently on amlodipine , Tenormin , and Lotensin . Previously 132/75 mmHg. - Monitor blood pressure at home. - Evaluate blood pressure response after pain control with Medrol . - Consider increasing Lotensin  if blood pressure remains elevated. - Discuss blood pressure management with cardiologist.  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 Previous corticosteroids effective. Medrol  chosen over prednisone  due to fewer side effects and prediabetic status. - Prescribe Medrol  4 mg six-day dose pack. - Avoid NSAIDs due to Eliquis  and kidney function. - Use Tylenol  for mild to moderate pain.  Prediabetes Blood sugar levels in prediabetic range with an average of 118 mg/dL. Previous HbA1c was 5.7%. - Continue low sugar diet. - Monitor blood sugar levels.  Vitamin B12 deficiency Vitamin B12 level low at 214 pg/mL. Reports feeling more energetic after starting oral supplementation. - Continue oral Vitamin B12 5000 mcg daily.  Follow up with cardiologist. Referal placed please call for appt. Also recommend seeing Dr .Amon mid September/sooner than current appt end of September.

## 2024-02-19 NOTE — Telephone Encounter (Signed)
 Spoke with Kiara at [facility] , patient is scheduled for 03/09/24 with Orren Fabry, PA-C, Apolinar notified    Also,  Eliquis  is $302

## 2024-03-08 NOTE — Progress Notes (Unsigned)
 Cardiology Office Note   Date:  03/09/2024  ID:  Dustin, Ashley May 14, 1941, MRN [id number] PCP: Dustin Aloysius BRAVO, MD  [facility] Providers Cardiologist:  Dustin Fell, MD  History of Present Illness Dustin Ashley is a 83 y.o. male with a past medical history of renal artery stenosis, coronary artery disease with remote CABG, hypertension, and mixed hyperlipidemia here for follow-up appointment.  His renal artery stenosis has been managed medically with treatment of hypertension over the years.  Blood pressure has been overall all well-controlled and kidney sizes been normal on serial ultrasound studies.  He was last seen 04/23/2022 and was doing well at that time with no recent heart problems.  Was still going to the [facility] on a regular basis for exercise.  Denied any exertional chest pain, chest pressure, edema, heart palpitations, orthopnea, and PND.  No strokelike or TIA symptoms.  Good blood pressure control at home.  On rare occasion if symptoms of low blood pressure and he attributes it to not eating or drinking enough.  Today, he presents with a history of open heart surgery and atrial fibrillation with episodes of near syncope. He is accompanied by his daughter.  He experiences episodes of near syncope, characterized by a sensation starting at the top of his head and feeling like everything is draining out of his body down to his feet. These episodes have occurred intermittently over the past 33 years since his open heart surgery, with approximately five or six episodes in total. The most recent episodes have occurred twice in the last 6 months, without complete loss of consciousness.  He was recently prescribed a blood thinner but discontinued it due to excessive bleeding from a minor cut. He is currently taking 81 mg of aspirin . He was found to have low B12 levels and started taking 5000 mcg of B12, which improved his energy levels.  He has a history of carotid artery  disease, last evaluated in 2023. He has not had a recent echocardiogram or worn a heart monitor in a long time.  He experiences swelling in his feet, which he attributes to his blood pressure medication. He wears sports socks to manage the swelling and is currently taking amlodipine  and benazepril  for blood pressure management. He used to exercise at the [facility] but stopped due to neck and back issues. No recent heart issues are reported.  Reports no shortness of breath nor dyspnea on exertion. Reports no chest pain, pressure, or tightness. No edema, orthopnea, PND. Reports no palpitations.   Discussed the use of AI scribe software for clinical note transcription with the patient, who gave verbal consent to proceed.   ROS: Pertinent ROS in HPI  Studies Reviewed EKG Interpretation Date/Time:  Tuesday March 09 2024 09:51:59 EDT Ventricular Rate:  55 PR Interval:  404 QRS Duration:  84 QT Interval:  448 QTC Calculation: 428 R Axis:   76  Text Interpretation: Sinus bradycardia with 1st degree A-V block Cannot rule out Anterior infarct , age undetermined When compared with ECG of 07-Sep-2018 09:01, PREVIOUS ECG IS PRESENT Confirmed by Dustin Ashley [phone]) on 03/09/2024 10:07:06 AM    Renal arterial doppler 11/28/2021: Right: Evidence of a > 60% stenosis of the right renal artery. RRV         flow present. Normal size right kidney. Normal right         Resisitive Index. Normal cortical thickness of right kidney.  Left:  Evidence of a > 60% stenosis in the  left renal artery. LRV         flow present. Normal size of left kidney. Normal left         Resistive Index. Normal cortical thickness of the left         kidney.  Mesenteric:  70 to 99% stenosis in the celiac artery and superior mesenteric artery.   Risk Assessment/Calculations   CHA2DS2-VASc Score = 4  This indicates a 4.8% annual risk of stroke. The patient's score is based upon: CHF History: 0 HTN History: 1 Diabetes History:  0 Stroke History: 0 Vascular Disease History: 1 Age Score: 2 Gender Score: 0       Physical Exam VS:  BP (!) 158/78   Pulse (!) 55   Ht 5' 9 (1.753 m)   Wt 196 lb 12.8 oz (89.3 kg)   SpO2 98%   BMI 29.06 kg/m        Wt Readings from Last 3 Encounters:  03/09/24 196 lb 12.8 oz (89.3 kg)  02/18/24 200 lb 6.4 oz (90.9 kg)  02/12/24 194 lb 12.8 oz (88.4 kg)    GEN: Well nourished, well developed in no acute distress NECK: No JVD; + L carotid bruit CARDIAC: RRR, no murmurs, rubs, gallops RESPIRATORY:  Clear to auscultation without rales, wheezing or rhonchi  ABDOMEN: Soft, non-tender, non-distended EXTREMITIES:  + bilateral edema; No deformity   ASSESSMENT AND PLAN  Syncope Intermittent syncope over 33 years with increased frequency. Differential includes carotid artery disease and aortic valve stenosis. -do not drive until workup complete - Order 30-day heart monitor to assess for arrhythmias or pauses. - Order bilateral carotid ultrasound to evaluate for carotid artery disease. - Order echocardiogram to assess for aortic valve stenosis.  Atrial fibrillation with anticoagulation management Atrial fibrillation with recent anticoagulation discontinuation due to bleeding. Risk of stroke due to irregular heartbeat. Discussed importance of restarting blood thinner to prevent stroke. - Restart blood thinner (Eliquis ) and provide samples. - Use a 30-day heart monitor to assess frequency of atrial fibrillation. - Discussed holding pressure for 5-10 minutes if bleeding occurs. - Explore coupon program for Eliquis  to reduce cost.  Hypertension with medication adjustment Current regimen includes amlodipine  and benazepril . Amlodipine  may cause lower extremity edema. Plan to adjust medication to manage blood pressure and reduce edema. - Increase benazepril  (Lotensin ) to 40 mg. - Decrease amlodipine  to 5 mg. - Monitor blood pressure regularly.  Lower extremity edema Mild lower  extremity edema likely secondary to amlodipine . Wearing sports socks helps manage swelling. - Decrease amlodipine  to 5 mg. - Increase benazepril  (Lotensin ) to 40 mg. - Continue wearing sports socks.  Left carotid bruit (last carotid ultrasound 05/03/2022 showed bilateral subclavian stenosis but carotid arteries have minimal blockage (less than 40% bilaterally)  -continue current medication management     Dispo: He can return in 2 months to review test results  Signed, Orren LOISE Fabry, PA-C

## 2024-03-09 ENCOUNTER — Ambulatory Visit: Attending: Physician Assistant | Admitting: Physician Assistant

## 2024-03-09 ENCOUNTER — Other Ambulatory Visit: Payer: Self-pay | Admitting: *Deleted

## 2024-03-09 ENCOUNTER — Telehealth: Payer: Self-pay | Admitting: Cardiovascular Disease

## 2024-03-09 VITALS — BP 158/78 | HR 55 | Ht 69.0 in | Wt 196.8 lb

## 2024-03-09 DIAGNOSIS — I2510 Atherosclerotic heart disease of native coronary artery without angina pectoris: Secondary | ICD-10-CM

## 2024-03-09 DIAGNOSIS — R0989 Other specified symptoms and signs involving the circulatory and respiratory systems: Secondary | ICD-10-CM | POA: Diagnosis not present

## 2024-03-09 DIAGNOSIS — R55 Syncope and collapse: Secondary | ICD-10-CM | POA: Diagnosis not present

## 2024-03-09 DIAGNOSIS — I10 Essential (primary) hypertension: Secondary | ICD-10-CM | POA: Diagnosis not present

## 2024-03-09 DIAGNOSIS — E782 Mixed hyperlipidemia: Secondary | ICD-10-CM

## 2024-03-09 DIAGNOSIS — I701 Atherosclerosis of renal artery: Secondary | ICD-10-CM | POA: Diagnosis not present

## 2024-03-09 MED ORDER — AMLODIPINE BESYLATE 5 MG PO TABS: 5.0000 mg | ORAL_TABLET | Freq: Every day | ORAL | 3 refills | Status: DC

## 2024-03-09 MED ORDER — BENAZEPRIL HCL 40 MG PO TABS: 40.0000 mg | ORAL_TABLET | Freq: Every day | ORAL | 3 refills | Status: DC

## 2024-03-09 NOTE — Telephone Encounter (Signed)
 Patient had an appointment and was told they would get samples for blood thinner and or a discount card. Also was needing clarity on dosage of another medication. Patient daughter in law brought patient in and filled out a DPR and requested w/ patient to call her. Informed her someone would reach out.

## 2024-03-09 NOTE — Telephone Encounter (Signed)
 Called patient and patient would like some samples of Eliquis  2.5mg  bid. Spoke to Pharm D and giving out samples are based on certain criteria that must be met. Patient made aware.

## 2024-03-09 NOTE — Patient Instructions (Signed)
 Medication Instructions:  Your physician has recommended you make the following change in your medication:  INCREASE LOTENSIN  TO 40 MG DAILY   DECREASE AMLODIPINE  TO 5 MG DAILY  *If you need a refill on your cardiac medications before your next appointment, please call your pharmacy*  Lab Work: NONE If you have labs (blood work) drawn today and your tests are completely normal, you will receive your results only by: MyChart Message (if you have MyChart) OR A paper copy in the mail If you have any lab test that is abnormal or we need to change your treatment, we will call you to review the results.  Testing/Procedures: Your physician has requested that you have a carotid duplex. This test is an ultrasound of the carotid arteries in your neck. It looks at blood flow through these arteries that supply the brain with blood. Allow one hour for this exam. There are no restrictions or special instructions.   Your physician has recommended that you wear an 30 event monitor. Event monitors are medical devices that record the heart's electrical activity. Doctors most often us  these monitors to diagnose arrhythmias. Arrhythmias are problems with the speed or rhythm of the heartbeat. The monitor is a small, portable device. You can wear one while you do your normal daily activities. This is usually used to diagnose what is causing palpitations/syncope (passing out).  Your physician has requested that you have an echocardiogram. Echocardiography is a painless test that uses sound waves to create images of your heart. It provides your doctor with information about the size and shape of your heart and how well your heart's chambers and valves are working. This procedure takes approximately one hour. There are no restrictions for this procedure. Please do NOT wear cologne, perfume, aftershave, or lotions (deodorant is allowed). Please arrive 15 minutes prior to your appointment time.  Please note: We ask at  that you not bring children with you during ultrasound (echo/ vascular) testing. Due to room size and safety concerns, children are not allowed in the ultrasound rooms during exams. Our front office staff cannot provide observation of children in our lobby area while testing is being conducted. An adult accompanying a patient to their appointment will only be allowed in the ultrasound room at the discretion of the ultrasound technician under special circumstances. We apologize for any inconvenience.    Follow-Up: At [facility], you and your health needs are our priority.  As part of our continuing mission to provide you with exceptional heart care, our providers are all part of one team.  This team includes your primary Cardiologist (physician) and Advanced Practice Providers or APPs (Physician Assistants and Nurse Practitioners) who all work together to provide you with the care you need, when you need it.  Your next appointment:   2 month(s)  Provider:   Ozell Fell, MD or Orren Fabry, PA-C  We recommend signing up for the patient portal called MyChart.  Sign up information is provided on this After Visit Summary.  MyChart is used to connect with patients for Virtual Visits (Telemedicine).  Patients are able to view lab/test results, encounter notes, upcoming appointments, etc.  Non-urgent messages can be sent to your provider as well.   To learn more about what you can do with MyChart, go to [email or web address].

## 2024-03-10 ENCOUNTER — Telehealth: Payer: Self-pay

## 2024-03-10 ENCOUNTER — Telehealth: Payer: Self-pay | Admitting: Cardiovascular Disease

## 2024-03-10 DIAGNOSIS — I10 Essential (primary) hypertension: Secondary | ICD-10-CM

## 2024-03-10 MED ORDER — APIXABAN 2.5 MG PO TABS: 2.5000 mg | ORAL_TABLET | Freq: Two times a day (BID) | ORAL | 0 refills | Status: DC

## 2024-03-10 NOTE — Telephone Encounter (Signed)
 Cardiology note reviewed, due to edema, amlodipine  decreased to 5 mg benazepril  increased from 20 mg to 40 mg. Patient is okay to proceed however I would like a BMP to be done in 2 - 3 weeks.  If there is a significant decrease in kidney function then we will have to reduce benazepril .

## 2024-03-10 NOTE — Telephone Encounter (Signed)
 Pt last saw Orren Fabry, [name] on 03/09/24, last labs 02/12/24 Creat 1.4, age 83, weight 89.3kg, based on specified criteria pt is not on appropriate dosage of Eliquis  2.5mg  BID.  However historically in the past pt's Creat has been elevated 10/13/23 1.65, 04/1023 Creat 1.52.  Will refill Eliquis  2.5mg  BID for afib x 1 90 day supply and then reassess Creat and Eliquis  dosage.

## 2024-03-10 NOTE — Telephone Encounter (Signed)
**Note De-identified  [place] Obfuscation** Please advise 

## 2024-03-10 NOTE — Telephone Encounter (Signed)
*  STAT* If patient is at the pharmacy, call can be transferred to refill team.   1. Which medications need to be refilled? (please list name of each medication and dose if known apixaban  (ELIQUIS ) 2.5 MG TABS tablet [[id number]]    2. Would you like to learn more about the convenience, safety, & potential cost savings by using the [facility]? Na      3. Are you open to using the [facility] (Type [facility]. NA    4. Which pharmacy/location (including street and city if local pharmacy) is medication to be sent to?  [id number] [place],  - [address] Phone: [phone]  Fax: [phone]       5. Do they need a 30 day or 90 day supply? 90

## 2024-03-10 NOTE — Telephone Encounter (Signed)
 Copied from CRM #[id number]. Topic: General - Other >> Mar 10, 2024 11:49 AM Rosina BIRCH wrote: Reason for CRM: patient called stating he saw his heart doctor assistant on yesterday and they is changing his blood pressure medicine.The patient stated MD Amon cut his blood pressure medicine in half because it was bad on his kidneys and he does not know what to do CB [phone]

## 2024-03-11 ENCOUNTER — Telehealth: Payer: Self-pay | Admitting: Cardiovascular Disease

## 2024-03-11 ENCOUNTER — Other Ambulatory Visit (HOSPITAL_COMMUNITY): Payer: Self-pay

## 2024-03-11 MED ORDER — APIXABAN 2.5 MG PO TABS
2.5000 mg | ORAL_TABLET | Freq: Two times a day (BID) | ORAL | 0 refills | Status: DC
  Filled 2024-03-11 (×2): qty 60, 30d supply, fill #0

## 2024-03-11 MED ORDER — APIXABAN 2.5 MG PO TABS
2.5000 mg | ORAL_TABLET | Freq: Two times a day (BID) | ORAL | 3 refills | Status: DC
Start: 2024-03-11 — End: 2024-03-11
  Filled 2024-03-11: qty 30, 15d supply, fill #0

## 2024-03-11 NOTE — Telephone Encounter (Signed)
 Spoke to son & dtr-in-law. Pt received some samples of Eliquis  from PCP for possible afib.   Cardiology saw pt 2 days ago, 30 day monitor ordered and advised to continue OAC for now until monitor results come back. Patient has not started the 30 day monitor as it has not arrived in the mail yet.  Advised that I will send a Rx to our pharmacy downstairs where pt can obtain a 30 day free supply.  They will reach out to PCP to discuss a few more weeks of samples to get pt through until monitor is complete/reviewed and to determine if continuing Eliquis . Aware pharmacy will also help  with any prior auth that may be needed.   Son and dtr-in-law are agreeable to plan.

## 2024-03-11 NOTE — Addendum Note (Signed)
 Addended by: GRETEL MAEOLA CROME on: 03/11/2024 09:33 AM   Modules accepted: Orders

## 2024-03-11 NOTE — Telephone Encounter (Signed)
 Pt c/o medication issue:  1. Name of Medication:   apixaban  (ELIQUIS ) 2.5 MG TABS tablet   2. How are you currently taking this medication (dosage and times per day)?   As prescribed  3. Are you having a reaction (difficulty breathing--STAT)?   4. What is your medication issue?   Daughter-in-law Harriet) stated patient cannot afford the $400 cost of this medication and wants to get samples or assistance getting this medication.

## 2024-03-11 NOTE — Telephone Encounter (Signed)
 Tried calling Pt- no answer, vm not set up. If Pt calls back, okay for E2C2 to discuss.

## 2024-03-12 NOTE — Telephone Encounter (Signed)
 Error/Investigation Details: Patient told Specialist that he had received a call back that day and when she check the encounter tab, the only note from that morning was from the cardiology office. Specialist did not do any further investigation to see if there was a response to the original CRM or if any notes from the day prior may have been regarding a call back. Specialist has been instructed to call patient to relay message, however labs are not currently placed and clinic will need to place labs PCP is wanting in 2-3 weeks and call patient to schedule those as E2C2 protocol requires labs to be placed prior to scheduling. Specialist is instructed to see if labs were placed at time of call in case they were ordered between Team Lead investigation and time of call. [employer] JTAPI ID: [id number]

## 2024-03-12 NOTE — Telephone Encounter (Signed)
 BMP ordered

## 2024-03-16 ENCOUNTER — Telehealth: Payer: Self-pay | Admitting: Cardiovascular Disease

## 2024-03-16 NOTE — Telephone Encounter (Signed)
 Calling to see if patient can come in to have the heart monitor put on. Please advise

## 2024-03-16 NOTE — Telephone Encounter (Signed)
 Patient scheduled to come to [facility] office, Wednesday , 03/17/24, 11:00 AM to have monitor applied and be given a tutorial on monitor use.

## 2024-03-17 ENCOUNTER — Telehealth: Payer: Self-pay | Admitting: Cardiovascular Disease

## 2024-03-17 ENCOUNTER — Ambulatory Visit: Attending: Cardiovascular Disease

## 2024-03-17 DIAGNOSIS — R55 Syncope and collapse: Secondary | ICD-10-CM | POA: Diagnosis not present

## 2024-03-17 NOTE — Telephone Encounter (Signed)
 Calling to report Urgent Notification from Pt monitor. Call transferred.

## 2024-03-17 NOTE — Telephone Encounter (Signed)
 Spoke to Sterling from [place]. This was a pt initiated event on 03/17/2024 at 11:06 am.(No symptoms, baseline recording). Alert was: New Onset Afib, HR 75 BPM. Report was faxed and visible in OnBase, indexed to pt's chart.

## 2024-03-17 NOTE — Telephone Encounter (Signed)
   Cardiac Monitor Alert  Date of alert:  03/17/2024   Patient Name: Dustin Ashley  DOB: 03-28-1941  MRN: [id number]   [facility] Cardiologist: Ozell Fell, MD  [facility] EP:  None    Monitor Information: Cardiac Event Monitor [[employer]]  Reason:  syncope, afib Ordering provider:  Orren PA   Alert Atrial Fibrillation/Flutter This is the 1st alert for this rhythm.  The patient has a hx of Atrial Fibrillation/Flutter.    Anticoagulation medication as of 03/17/2024           apixaban  (ELIQUIS ) 2.5 MG TABS tablet Take 1 tablet (2.5 mg total) by mouth 2 (two) times daily.       Next Cardiology Appointment   Date:  05/11/2024  Provider:  Orren PA    Laura-Lee Villegas [place], RN  03/17/2024 1:46 PM

## 2024-03-17 NOTE — Progress Notes (Unsigned)
 [facility] event mailed to patient 03/09/24 and applied in office. Dr. Wonda to read.

## 2024-03-19 ENCOUNTER — Ambulatory Visit (HOSPITAL_COMMUNITY)
Admission: RE | Admit: 2024-03-19 | Discharge: 2024-03-19 | Disposition: A | Discharge status: Home / Self Care | Source: Ambulatory Visit | Attending: Physician Assistant | Admitting: Physician Assistant

## 2024-03-19 DIAGNOSIS — R55 Syncope and collapse: Secondary | ICD-10-CM | POA: Insufficient documentation

## 2024-03-21 ENCOUNTER — Ambulatory Visit: Payer: Self-pay | Admitting: Physician Assistant

## 2024-03-24 DIAGNOSIS — L570 Actinic keratosis: Secondary | ICD-10-CM | POA: Diagnosis not present

## 2024-03-24 DIAGNOSIS — L821 Other seborrheic keratosis: Secondary | ICD-10-CM | POA: Diagnosis not present

## 2024-03-24 DIAGNOSIS — L814 Other melanin hyperpigmentation: Secondary | ICD-10-CM | POA: Diagnosis not present

## 2024-03-24 DIAGNOSIS — L244 Irritant contact dermatitis due to drugs in contact with skin: Secondary | ICD-10-CM | POA: Diagnosis not present

## 2024-03-27 ENCOUNTER — Other Ambulatory Visit: Payer: Self-pay | Admitting: Internal Medicine

## 2024-03-30 ENCOUNTER — Ambulatory Visit: Payer: Self-pay

## 2024-03-30 NOTE — Telephone Encounter (Signed)
Appt scheduled w/ Dr. Nani Ravens tomorrow.

## 2024-03-30 NOTE — Telephone Encounter (Signed)
 FYI Only or Action Required?: Action required by provider: request for appointment.  Patient was last seen in primary care on 02/18/2024 by Dorina Loving, PA-C.  Called Nurse Triage reporting Sinusitis.  Symptoms began a week ago.  Interventions attempted: OTC medications: mucinex and cough syrup.  Symptoms are: unchanged.  Triage Disposition: See PCP When Office is Open (Within 3 Days)  Patient/caregiver understands and will follow disposition?: YesCopied from CRM [id number]. Topic: Clinical - Red Word Triage >> Mar 30, 2024  9:56 AM Rea ORN wrote: Red Word that prompted transfer to Nurse Triage: Sx began last Friday: chest congestion, productive cough getting worse Reason for Disposition  Lots of coughing  Answer Assessment - Initial Assessment Questions Pt has congestion/cough for a week. Pt has taken mucinex and cough medicine. Pt denies SOB.     1. LOCATION: Where does it hurt?      Chest congestion 2. ONSET: When did the sinus pain start?  (e.g., hours, days)      Last friday 3. SEVERITY: How bad is the pain?   (Scale 0-10; or none, mild, moderate or severe)     denies 4. RECURRENT SYMPTOM: Have you ever had sinus problems before? If Yes, ask: When was the last time? and What happened that time?      na 5. NASAL CONGESTION: Is the nose blocked? If Yes, ask: Can you open it or must you breathe through your mouth?     na 6. NASAL DISCHARGE: Do you have discharge from your nose? If so ask, What color?     denies 7. FEVER: Do you have a fever? If Yes, ask: What is it, how was it measured, and when did it start?      Not sure 8. OTHER SYMPTOMS: Do you have any other symptoms? (e.g., sore throat, cough, earache, difficulty breathing)     Productive cough- yellow  Protocols used: Sinus Pain or Congestion-A-AH

## 2024-03-31 ENCOUNTER — Other Ambulatory Visit: Payer: Self-pay | Admitting: Internal Medicine

## 2024-03-31 ENCOUNTER — Encounter: Payer: Self-pay | Admitting: Family Medicine

## 2024-03-31 ENCOUNTER — Ambulatory Visit (INDEPENDENT_AMBULATORY_CARE_PROVIDER_SITE_OTHER): Admitting: Family Medicine

## 2024-03-31 VITALS — BP 156/94 | HR 82 | Temp 98.0°F | Resp 16 | Ht 69.0 in | Wt 194.0 lb

## 2024-03-31 DIAGNOSIS — J069 Acute upper respiratory infection, unspecified: Secondary | ICD-10-CM

## 2024-03-31 DIAGNOSIS — I10 Essential (primary) hypertension: Secondary | ICD-10-CM | POA: Diagnosis not present

## 2024-03-31 LAB — POC COVID19 BINAXNOW: SARS Coronavirus 2 Ag: NEGATIVE

## 2024-03-31 LAB — POCT INFLUENZA A/B
Influenza A, POC: NEGATIVE
Influenza B, POC: NEGATIVE

## 2024-03-31 MED ORDER — AZITHROMYCIN 250 MG PO TABS: ORAL_TABLET | ORAL | 0 refills | Status: DC

## 2024-03-31 NOTE — Progress Notes (Signed)
 Chief Complaint  Patient presents with   Cough    Cough and congestion     Dustin Ashley here for URI complaints.  Duration: 5 days  Associated symptoms: rhinorrhea, chest tightness, and productive cough Denies: sinus congestion, sinus pain, itchy watery eyes, ear pain, ear drainage, sore throat, wheezing, shortness of breath, myalgia (had earlier), and fevers, N/V Treatment to date: Tylenol , amoxicillin , Mucinex, Robitussin, Tessalon  Perles Sick contacts: No  Past Medical History:  Diagnosis Date   Allergic rhinitis    Allergy    seasonal   Arthritis    Bell palsy    CAD (coronary artery disease)    Cataract    Chronic renal insufficiency, stage II (mild)    Cr ~ 1.4, u/s 4-12 normal kidneys   Gallstone pancreatitis 2015   GERD (gastroesophageal reflux disease)    HOH (hard of hearing)    has a hearing aid L, sees audiology rountinely   Hyperglycemia    A1C 5.8 04-2010   Hyperlipemia    Hypertension    Pain, joint, multiple sites    uses valium , occ uses for insomnia. uses for shoulder  pain   Paroxysmal atrial fibrillation (HCC)    Personal history of colonic polyps    Pulmonary emboli (HCC)    02-2014   RAS (renal artery stenosis) (HCC) 05-2012    Renal insufficiency    SCC (squamous cell carcinoma)     Objective BP (!) 156/94 (BP Location: Left Arm, Cuff Size: Normal)   Pulse 82   Temp 98 F (36.7 C) (Oral)   Resp 16   Ht 5' 9 (1.753 m)   Wt 194 lb (88 kg)   SpO2 95%   BMI 28.65 kg/m  General: Awake, alert, appears stated age HEENT: AT, [place], ears patent b/l and TM's neg, nares patent w/o discharge, pharynx pink and without exudates, MMM, no sinus ttp Neck: No masses or asymmetry Heart: RRR Lungs: CTAB, no accessory muscle use Psych: Age appropriate judgment and insight, normal mood and affect  URI with cough and congestion  White coat syndrome with hypertension  Continue to push fluids, practice good hand hygiene, cover mouth when coughing. Cont  Tessalon  Perles and Mucinex. If no improvement in next 2-3 d, take Zpak. F/u prn. If starting to experience fevers, shaking, or shortness of breath, seek immediate care. He monitors BP at home and it is lower/nml. He will continue under the care of his reg med team for this.  Pt voiced understanding and agreement to the plan.  Mabel [place], DO 03/31/24 10:10 AM

## 2024-03-31 NOTE — Addendum Note (Signed)
 Addended by: Paitlyn Mcclatchey M on: 03/31/2024 11:04 AM   Modules accepted: Orders

## 2024-03-31 NOTE — Patient Instructions (Addendum)
 Continue to push fluids, practice good hand hygiene, and cover your mouth if you cough.  If you start having fevers, shaking or shortness of breath, seek immediate care.  OK to take Tylenol  1000 mg (2 extra strength tabs) or 975 mg (3 regular strength tabs) every 6 hours as needed.  Please continue the Mucinex and cough pills.  If you are not better by Fri/Saturday or are getting worse, please take the antibiotic.   Let us  know if you need anything.

## 2024-04-05 ENCOUNTER — Telehealth: Payer: Self-pay | Admitting: Cardiovascular Disease

## 2024-04-05 NOTE — Telephone Encounter (Signed)
Urgent EKG notification. Please advise

## 2024-04-05 NOTE — Telephone Encounter (Signed)
 Per Garen with [facility]-reporting auto-trigger At Fib with HR of 69 bpm with 3 second pause at 10:21 AM today.  Pt was asymptomatic at the time.

## 2024-04-05 NOTE — Telephone Encounter (Signed)
 30 day monitor ordered for At Fib by Orren Fabry, PA who has reviewed the monitor strip sent by [facility]. Spoke with pt who reports he has been sick for 9 days and is now taking an antibiotic.  He has been coughing up a lot of green stuff.  This morning he reports coughing a lot and feeling as if he got choked.  No other s/s.   Pt is taking atenolol  75 mg daily and I will have Orren Fabry, PA review this since he did have a 3 second pause but could have been related to the coughing episode.  Orren had given a verbal order for EP referral but will have her to review before placing the order.

## 2024-04-09 ENCOUNTER — Ambulatory Visit (HOSPITAL_COMMUNITY)
Admission: RE | Admit: 2024-04-09 | Discharge: 2024-04-09 | Disposition: A | Discharge status: Home / Self Care | Source: Ambulatory Visit | Attending: Physician Assistant | Admitting: Physician Assistant

## 2024-04-09 DIAGNOSIS — R55 Syncope and collapse: Secondary | ICD-10-CM | POA: Diagnosis not present

## 2024-04-09 LAB — ECHOCARDIOGRAM COMPLETE
AR max vel: 1.96 cm2
AV Area VTI: 1.9 cm2
AV Area mean vel: 1.66 cm2
AV Mean grad: 3 mmHg
AV Peak grad: 4.2 mmHg
Ao pk vel: 1.03 m/s
Area-P 1/2: 4.57 cm2
S' Lateral: 2.8 cm

## 2024-04-12 ENCOUNTER — Telehealth: Payer: Self-pay | Admitting: Cardiovascular Disease

## 2024-04-12 ENCOUNTER — Encounter: Payer: Self-pay | Admitting: Internal Medicine

## 2024-04-12 ENCOUNTER — Ambulatory Visit: Admitting: Internal Medicine

## 2024-04-12 VITALS — BP 136/82 | HR 87 | Temp 98.0°F | Resp 16 | Ht 69.0 in | Wt 196.0 lb

## 2024-04-12 DIAGNOSIS — J40 Bronchitis, not specified as acute or chronic: Secondary | ICD-10-CM | POA: Diagnosis not present

## 2024-04-12 DIAGNOSIS — R55 Syncope and collapse: Secondary | ICD-10-CM

## 2024-04-12 DIAGNOSIS — I10 Essential (primary) hypertension: Secondary | ICD-10-CM | POA: Diagnosis not present

## 2024-04-12 DIAGNOSIS — I480 Paroxysmal atrial fibrillation: Secondary | ICD-10-CM | POA: Diagnosis not present

## 2024-04-12 LAB — BASIC METABOLIC PANEL WITH GFR
BUN: 34 mg/dL — ABNORMAL HIGH (ref 6–23)
CO2: 26 meq/L (ref 19–32)
Calcium: 9 mg/dL (ref 8.4–10.5)
Chloride: 109 meq/L (ref 96–112)
Creatinine, Ser: 1.62 mg/dL — ABNORMAL HIGH (ref 0.40–1.50)
GFR: 39.03 mL/min — ABNORMAL LOW (ref >60.00)
Glucose, Bld: 107 mg/dL — ABNORMAL HIGH (ref 70–99)
Potassium: 4.3 meq/L (ref 3.5–5.1)
Sodium: 142 meq/L (ref 135–145)

## 2024-04-12 MED ORDER — BENZONATATE 100 MG PO CAPS: 100.0000 mg | ORAL_CAPSULE | Freq: Three times a day (TID) | ORAL | 0 refills | Status: DC | PRN

## 2024-04-12 NOTE — Patient Instructions (Addendum)
 For cough: Continue with mucinex  twice daily until better You can also take Tessalon  Perles No need for more antibiotics at this point Let me know if you are not probably improving     Check the  blood pressure regularly Blood pressure goal:  between 110/65 and  135/85. If it is consistently higher or lower, let me know  Get a flu shot and a COVID booster at your convenience   GO TO THE LAB :  Get the blood work   Your results will be posted on MyChart with my comments  Go to the front desk for the checkout Please make an appointment for a checkup in 3 months

## 2024-04-12 NOTE — Telephone Encounter (Signed)
 Spoke with pt and went over echo results. Pt verbalized understanding of results and had no further questions at this time.

## 2024-04-12 NOTE — Telephone Encounter (Signed)
 Patient returned staff call regarding results.

## 2024-04-12 NOTE — Progress Notes (Signed)
 "  Subjective:    Patient ID: Dustin Ashley, male    DOB: [birth year], 83 y.o.   MRN: [id number]  DOS:  04/12/2024 Type of visit - description: Follow-up  Discussed the use of AI scribe software for clinical note transcription with the patient, who gave verbal consent to proceed.  History of Present Illness   Syncope and presyncope - Multiple episodes of syncope over a 33-year period - Most recent episode involved a brief blackout lasting a couple of seconds - Prodromal sensation starts at the top of the head and feels like everything drains out of the body down to the feet - Episodes occur intermittently, sometimes with gaps of two to three years or longer  Paroxysmal atrial fibrillation  -Started apixaban  few weeks ago.  Blood pressure fluctuations - Blood pressure fluctuates, with recent readings as high as 170 mmHg - Usual blood pressure is around 125-130 mmHg when not taking excessive medication  vitamin b12 deficiency Last vitamin B12 level was in the low normal, started on vitamin B12 supplements  - Had some weakness,  improved with B12 supplementation  Respiratory symptoms - Ongoing chest congestion and cough - Occasional DOE - Z-Pak provided some relief, but chest congestion and cough persist - No fever, chills, wheezing, or shortness of breath - Using Mucinex   Peripheral edema - Swelling in ankles has improved since medication adjustment  Review of Systems See above   Past Medical History:  Diagnosis Date   Allergic rhinitis    Allergy    seasonal   Arthritis    Bell palsy    CAD (coronary artery disease)    Cataract    Chronic renal insufficiency, stage II (mild)    Cr ~ 1.4, u/s 4-12 normal kidneys   Gallstone pancreatitis 2015   GERD (gastroesophageal reflux disease)    HOH (hard of hearing)    has a hearing aid L, sees audiology rountinely   Hyperglycemia    A1C 5.8 04-2010   Hyperlipemia    Hypertension    Pain, joint, multiple sites    uses  valium , occ uses for insomnia. uses for shoulder  pain   Paroxysmal atrial fibrillation (HCC)    Personal history of colonic polyps    Pulmonary emboli (HCC)    02-2014   RAS (renal artery stenosis) 05-2012    Renal insufficiency    SCC (squamous cell carcinoma)     Past Surgical History:  Procedure Laterality Date   CARDIAC CATHETERIZATION     CATARACT EXTRACTION     CHOLECYSTECTOMY N/A 02/15/2014   Procedure: LAPAROSCOPIC CHOLECYSTECTOMY with IOC;  Surgeon: Camellia CHRISTELLA Blush, MD;  Location: [facility] OR;  Service: General;  Laterality: N/A;   COLONOSCOPY     CORONARY ARTERY BYPASS GRAFT  1995   ESOPHAGOGASTRODUODENOSCOPY N/A 03/02/2014   Procedure: ESOPHAGOGASTRODUODENOSCOPY (EGD);  Surgeon: Gwendlyn ONEIDA Buddy, MD;  Location[facility] ENDOSCOPY;  Service: Endoscopy;  Laterality: N/A;  sarah/leone    Current Outpatient Medications  Medication Instructions   amLODipine  (NORVASC ) 5 mg, Oral, Daily   atenolol  (TENORMIN ) 75 mg, Oral, Daily   atorvastatin  (LIPITOR) 20 mg, Oral, Daily   azithromycin  (ZITHROMAX ) 250 MG tablet Take 2 tabs the first day and then 1 tab daily until you run out.   benazepril  (LOTENSIN ) 40 mg, Oral, Daily   diazepam  (VALIUM ) 10 mg, Oral, Every 12 hours PRN   Eliquis  2.5 mg, Oral, 2 times daily   fexofenadine (ALLEGRA) 180 mg, Daily       Objective:  Physical Exam BP 136/82   Pulse 87   Temp 98 F (36.7 C) (Oral)   Resp 16   Ht 5' 9 (1.753 m)   Wt 196 lb (88.9 kg)   SpO2 96%   BMI 28.94 kg/m  General:   Well developed, NAD, BMI noted. HEENT:  Normocephalic . Face symmetric, atraumatic Lungs:  CTA B Normal respiratory effort, no intercostal retractions, no accessory muscle use. Heart: RRR,  no murmur.  Occasional irregular beat. Lower extremities: Trace pretibial edema bilaterally  Skin: Not pale. Not jaundice Neurologic:  alert & oriented X3.  Speech normal, gait appropriate for age and unassisted Psych--  Cognition and judgment appear intact.  Cooperative  with normal attention span and concentration.  Behavior appropriate. No anxious or depressed appearing.      Assessment   Assessment Prediabetes HTN Hyperlipidemia GERD, h/o Candida esophagitis 02-2014, nexium  prn Insomnia, on diazepam  MSK  neck pain, back pain, diazepam  prn CKD  Renal us  wnl 2012 CV: --CAD, CABG 1995 --Paroxysmal atrial fibrillation; (-) 3 week monitor ~07-2014  , not anticoag   --Pulmonary emboli , DVT, provoked 02-2014 --RAS dx 2013 --carotid disease:< 40% B 04-2022, med mngmt  ED  DERM: sees derm regulalrly HOH-- has aids H/o Bell palsy  H/ o gallbladder pancreatitis H/o Low testosterone  11-2014  PLAN: Assessment and Plan Assessment & Plan Multiple issues today: Recurrent syncope Intermittent syncope over 33 years, recent episode with brief loss of consciousness. Was seen here at this office, workup so far has included echo and a carotid ultrasound with mild carotid disease unlikely to be the cause of her syncope. Because his history of paroxysmal atrial fibrillation they are doing cardiac monitor, in the meantime he started on apixaban  until situation is clarified.  Seems to be tolerating well. Cardiology recommend no driving until further notice Paroxysmal atrial fibrillation: See above Hypertension  D/t edema, cardiology decrease amlodipine  and increase benazepril , edema improved.   Blood pressure variable, but recent readings very good at129/71, 119/73. Previously benazepril  increased creatinine, check a BMP.[name] Acute bronchitis recently seen at this office, with respiratory symptoms, now w/ persistent cough and chest congestion Treated with Mucinex  and Z-Pak, some relief. No fever, chills, or wheezing.  Plan: Continue conservative treatment, see AVS. Vitamin B12 deficiency B12 level well in the low side of normal, on supplements, feels more energetic. Preventive care: Declined vaccines today.  Recommend to proceed with a flu shot and a COVID booster  when she feels better. RTC 3 months   32 minutes, addressing a number of issues, reviewing the chart "

## 2024-04-13 ENCOUNTER — Ambulatory Visit: Payer: Self-pay | Admitting: Internal Medicine

## 2024-04-13 NOTE — Assessment & Plan Note (Signed)
 Multiple issues today: Recurrent syncope Intermittent syncope over 33 years, recent episode with brief loss of consciousness. Was seen here at this office, workup so far has included echo and a carotid ultrasound with mild carotid disease unlikely to be the cause of her syncope. Because his history of paroxysmal atrial fibrillation they are doing cardiac monitor, in the meantime he started on apixaban  until situation is clarified.  Seems to be tolerating well. Cardiology recommend no driving until further notice Paroxysmal atrial fibrillation: See above Hypertension  D/t edema, cardiology decrease amlodipine  and increase benazepril , edema improved.   Blood pressure variable, but recent readings very good at129/71, 119/73. Previously benazepril  increased creatinine, check a BMP.[name] Acute bronchitis recently seen at this office, with respiratory symptoms, now w/ persistent cough and chest congestion Treated with Mucinex and Z-Pak, some relief. No fever, chills, or wheezing.  Plan: Continue conservative treatment, see AVS. Vitamin B12 deficiency B12 level well in the low side of normal, on supplements, feels more energetic. Preventive care: Declined vaccines today.  Recommend to proceed with a flu shot and a COVID booster when she feels better. RTC 3 months

## 2024-04-14 ENCOUNTER — Other Ambulatory Visit: Payer: Self-pay | Admitting: Family

## 2024-04-19 ENCOUNTER — Telehealth: Payer: Self-pay | Admitting: Internal Medicine

## 2024-04-19 MED ORDER — DIAZEPAM 10 MG PO TABS: 10.0000 mg | ORAL_TABLET | Freq: Two times a day (BID) | ORAL | 3 refills | Status: AC | PRN

## 2024-04-19 NOTE — Telephone Encounter (Signed)
 Copied from CRM [id number]. Topic: Clinical - Medication Refill >> Apr 19, 2024  8:17 AM Ivette P wrote: Medication: diazepam  (VALIUM ) 10 MG tablet  Has the patient contacted their pharmacy? Yes (Agent: If no, request that the patient contact the pharmacy for the refill. If patient does not wish to contact the pharmacy document the reason why and proceed with request.) (Agent: If yes, when and what did the pharmacy advise?)  This is the patient's preferred pharmacy:  [facility] [phone] [facility],  - [address] [place] [address] Phone: [phone] Fax: [phone]  Is this the correct pharmacy for this prescription? Yes If no, delete pharmacy and type the correct one.   Has the prescription been filled recently? No  Is the patient out of the medication? Yes  Has the patient been seen for an appointment in the last year OR does the patient have an upcoming appointment? Yes  Can we respond through MyChart? No  Agent: Please be advised that Rx refills may take up to 3 business days. We ask that you follow-up with your pharmacy.

## 2024-04-19 NOTE — Telephone Encounter (Signed)
 PDMP okay, Rx sent

## 2024-04-19 NOTE — Telephone Encounter (Signed)
 Requesting: diazepam  10mg   Contract: 10/08/22 UDS: 10/13/23 Last Visit: 04/12/24 Next Visit: 07/13/24 Last Refill: 02/09/24 #60 and 0RF   Please Advise

## 2024-04-20 DIAGNOSIS — R55 Syncope and collapse: Secondary | ICD-10-CM | POA: Diagnosis not present

## 2024-04-22 ENCOUNTER — Telehealth: Payer: Self-pay | Admitting: Internal Medicine

## 2024-04-22 NOTE — Telephone Encounter (Unsigned)
 Copied from CRM [id number]. Topic: Medicare AWV >> Apr 22, 2024  9:34 AM Nathanel DEL wrote: Reason for CRM: Called 04/22/2024 to sched AWV - NO VOICEMAIL  Nathanel Paschal; Care Guide Ambulatory Clinical Support [place] [facility] Direct Dial: [phone]

## 2024-04-22 NOTE — Telephone Encounter (Signed)
 Copied from CRM [id number]. Topic: Medicare AWV >> Apr 22, 2024  9:30 AM Nathanel DEL wrote: Reason for CRM: Called 04/22/2024 to sched AWV - NO VOICEMAIL  Nathanel Paschal; Care Guide Ambulatory Clinical Support [place] [facility] Direct Dial: [phone]

## 2024-05-06 NOTE — Progress Notes (Signed)
 Cardiology Office Note   Date:  05/11/2024  ID:  Dustin Ashley, DOB 07/29/1940, MRN [id number] PCP: Amon Aloysius BRAVO, MD  [facility] Providers Cardiologist:  Dustin Fell, MD    History of Present Illness Dustin Ashley is a 83 y.o. male with a past medical history of renal artery stenosis, coronary artery disease with remote CABG, hypertension, and mixed hyperlipidemia here for follow-up appointment.  His renal artery stenosis has been managed medically with treatment of hypertension over the years.  Blood pressure has been overall all well-controlled and kidney sizes been normal on serial ultrasound studies.  He was last seen 04/23/2022 and was doing well at that time with no recent heart problems.  Was still going to the [facility] on a regular basis for exercise.  Denied any exertional chest pain, chest pressure, edema, heart palpitations, orthopnea, and PND.  No strokelike or TIA symptoms.  Good blood pressure control at home.  On rare occasion if symptoms of low blood pressure and he attributes it to not eating or drinking enough.  I saw him 02/2024, he presents with a history of open heart surgery and atrial fibrillation with episodes of near syncope. He is accompanied by his daughter.  He experiences episodes of near syncope, characterized by a sensation starting at the top of his head and feeling like everything is draining out of his body down to his feet. These episodes have occurred intermittently over the past 33 years since his open heart surgery, with approximately five or six episodes in total. The most recent episodes have occurred twice in the last 6 months, without complete loss of consciousness.  He was recently prescribed a blood thinner but discontinued it due to excessive bleeding from a minor cut. He is currently taking 81 mg of aspirin . He was found to have low B12 levels and started taking 5000 mcg of B12, which improved his energy levels.  He has a history of  carotid artery disease, last evaluated in 2023. He has not had a recent echocardiogram or worn a heart monitor in a long time.  He experiences swelling in his feet, which he attributes to his blood pressure medication. He wears sports socks to manage the swelling and is currently taking amlodipine  and benazepril  for blood pressure management. He used to exercise at the [facility] but stopped due to neck and back issues. No recent heart issues are reported.  Reports no shortness of breath nor dyspnea on exertion. Reports no chest pain, pressure, or tightness. No edema, orthopnea, PND. Reports no palpitations.   Discussed the use of AI scribe software for clinical note transcription with the patient, who gave verbal consent to proceed.  Today, he presents with atrial fibrillation and hypertension who presents for cardiovascular follow-up.  He experiences elevated blood pressure readings during visits, attributed to 'white coat syndrome', while home readings are stable. He takes benazepril  40 mg daily, split into two doses, to manage blood pressure, as a full dose previously caused headaches. Amlodipine  was reduced to 5 mg due to leg swelling, which has improved.  He has atrial fibrillation and was previously on Eliquis , now replaced with aspirin  (per patient). He experienced a blackout episode, attributed to low blood sugar or blood pressure, with similar episodes over 33 years, though only one resulted in blacking out.  An echocardiogram was performed about a month ago, and a neck ultrasound was conducted. Kidney function is monitored biannually due to his medication regimen, with the last test in September.  He underwent open-heart surgery, with occasional missed heartbeats noted for 33 years. He wears compression stockings for leg swelling. No new medication side effects since the last visit.   Reports no shortness of breath nor dyspnea on exertion. Reports no chest pain, pressure, or tightness. No  edema, orthopnea, PND. Reports no palpitations.   Discussed the use of AI scribe software for clinical note transcription with the patient, who gave verbal consent to proceed.  ROS: Pertinent ROS in HPI  Studies Reviewed      Renal arterial doppler 11/28/2021: Right: Evidence of a > 60% stenosis of the right renal artery. RRV         flow present. Normal size right kidney. Normal right         Resisitive Index. Normal cortical thickness of right kidney.  Left:  Evidence of a > 60% stenosis in the left renal artery. LRV         flow present. Normal size of left kidney. Normal left         Resistive Index. Normal cortical thickness of the left         kidney.  Mesenteric:  70 to 99% stenosis in the celiac artery and superior mesenteric artery.   Risk Assessment/Calculations   CHA2DS2-VASc Score = 4  This indicates a 4.8% annual risk of stroke. The patient's score is based upon: CHF History: 0 HTN History: 1 Diabetes History: 0 Stroke History: 0 Vascular Disease History: 1 Age Score: 2 Gender Score: 0       Physical Exam VS:  BP (!) 160/70   Pulse 66   Ht 5' 9 (1.753 m)   Wt 195 lb 12.8 oz (88.8 kg)   SpO2 97%   BMI 28.91 kg/m        Wt Readings from Last 3 Encounters:  05/11/24 195 lb 12.8 oz (88.8 kg)  04/12/24 196 lb (88.9 kg)  03/31/24 194 lb (88 kg)    GEN: Well nourished, well developed in no acute distress NECK: No JVD; + L carotid bruit CARDIAC: IRIR, no murmurs, rubs, gallops RESPIRATORY:  Clear to auscultation without rales, wheezing or rhonchi  ABDOMEN: Soft, non-tender, non-distended EXTREMITIES:  + bilateral edema; No deformity   ASSESSMENT AND PLAN   Atrial fibrillation with high burden and risk of stroke Atrial fibrillation present 80% of the time, increasing stroke risk. Advised to restart Eliquis  due to 5% annual stroke risk. Discussed limitations of aspirin  and explored Watchman procedure and ablation. Discussed financial burden of Eliquis   and potential assistance programs. - Restart Eliquis  for stroke prevention. - Refer to electrophysiologist for evaluation of ablation and possible Watchman procedure. Engineer, Maintenance (it) pharmacy team to explore financial assistance programs for Eliquis .  Syncope Episodes of syncope with recent brief loss of consciousness. Differential includes hypotension or hypoglycemia. No direct cardiac cause identified, but AFib discovered during workup. -carotid US , echo, and monitor reviewed with the patient today  Essential hypertension Blood pressure well-controlled at home. Experiences headaches potentially related to benazepril  dosing. Discussed splitting benazepril  dose to manage headaches. - Split benazepril  dose to 20 mg in the morning and 20 mg in the evening. - Continue monitoring blood pressure at home.  Hypertension with medication adjustment Current regimen includes amlodipine  and benazepril . Amlodipine  may cause lower extremity edema. Plan to adjust medication to manage blood pressure and reduce edema. -continue benazepril  (Lotensin ) 40mg  daily in divided doses. - continue amlodipine  to 5 mg. - Monitor blood pressure regularly.   Left carotid bruit (last carotid  ultrasound 05/03/2022 showed bilateral subclavian stenosis but carotid arteries have minimal blockage (less than 40% bilaterally)  -continue current medication management     Dispo: He can return in 4 months for follow-up. Referral to EP placed for Afib management  Signed, Orren LOISE Fabry, PA-C

## 2024-05-11 ENCOUNTER — Ambulatory Visit: Attending: Physician Assistant | Admitting: Physician Assistant

## 2024-05-11 ENCOUNTER — Telehealth: Payer: Self-pay | Admitting: Pharmacy Technician

## 2024-05-11 ENCOUNTER — Telehealth: Payer: Self-pay | Admitting: Pharmacist

## 2024-05-11 ENCOUNTER — Other Ambulatory Visit (HOSPITAL_COMMUNITY): Payer: Self-pay

## 2024-05-11 ENCOUNTER — Encounter: Payer: Self-pay | Admitting: Physician Assistant

## 2024-05-11 VITALS — BP 160/70 | HR 66 | Ht 69.0 in | Wt 195.8 lb

## 2024-05-11 DIAGNOSIS — R55 Syncope and collapse: Secondary | ICD-10-CM | POA: Diagnosis not present

## 2024-05-11 DIAGNOSIS — E782 Mixed hyperlipidemia: Secondary | ICD-10-CM | POA: Diagnosis not present

## 2024-05-11 DIAGNOSIS — I2510 Atherosclerotic heart disease of native coronary artery without angina pectoris: Secondary | ICD-10-CM

## 2024-05-11 DIAGNOSIS — I10 Essential (primary) hypertension: Secondary | ICD-10-CM

## 2024-05-11 DIAGNOSIS — I701 Atherosclerosis of renal artery: Secondary | ICD-10-CM

## 2024-05-11 DIAGNOSIS — R0989 Other specified symptoms and signs involving the circulatory and respiratory systems: Secondary | ICD-10-CM | POA: Diagnosis not present

## 2024-05-11 MED ORDER — BENAZEPRIL HCL 40 MG PO TABS: 20.0000 mg | ORAL_TABLET | Freq: Two times a day (BID) | ORAL | Status: DC

## 2024-05-11 NOTE — Patient Instructions (Addendum)
 Medication Instructions:  Take 20mg  of Benazepril  twice a day.  *If you need a refill on your cardiac medications before your next appointment, please call your pharmacy*  Lab Work: None ordered If you have labs (blood work) drawn today and your tests are completely normal, you will receive your results only by: MyChart Message (if you have MyChart) OR A paper copy in the mail If you have any lab test that is abnormal or we need to change your treatment, we will call you to review the results.  Testing/Procedures: None ordered  Follow-Up: At [facility], you and your health needs are our priority.  As part of our continuing mission to provide you with exceptional heart care, our providers are all part of one team.  This team includes your primary Cardiologist (physician) and Advanced Practice Providers or APPs (Physician Assistants and Nurse Practitioners) who all work together to provide you with the care you need, when you need it.  Your next appointment:   4 month(s)  Provider:   Ozell Fell, MD or Orren Fabry, PA or Available APP   We recommend signing up for the patient portal called MyChart.  Sign up information is provided on this After Visit Summary.  MyChart is used to connect with patients for Virtual Visits (Telemedicine).  Patients are able to view lab/test results, encounter notes, upcoming appointments, etc.  Non-urgent messages can be sent to your provider as well.   To learn more about what you can do with MyChart, go to [email or web address].   Other Instructions

## 2024-05-11 NOTE — Telephone Encounter (Signed)
 Patient Advocate Encounter   The patient was approved for a [employer] grant that will help cover the cost of Eliquis  Total amount awarded, 7500.00.  Effective: 04/11/24 - 04/10/25   APW:[id number] ERW:[id number] Hmnle:[id number] PI:[id number]  [employer] ID: [id number]   Pharmacy provided with approval and processing information.

## 2024-05-21 NOTE — Telephone Encounter (Signed)
 Enrolled in [facility] grant for DOAC

## 2024-06-15 ENCOUNTER — Inpatient Hospital Stay (HOSPITAL_COMMUNITY)
Admission: EM | Admit: 2024-06-15 | Discharge: 2024-06-21 | DRG: 064 | Disposition: A | Discharge status: Rehabilitation Facility | Attending: Family Medicine | Admitting: Family Medicine

## 2024-06-15 ENCOUNTER — Emergency Department (HOSPITAL_COMMUNITY)

## 2024-06-15 ENCOUNTER — Encounter (HOSPITAL_COMMUNITY): Payer: Self-pay

## 2024-06-15 ENCOUNTER — Other Ambulatory Visit: Payer: Self-pay

## 2024-06-15 DIAGNOSIS — I10 Essential (primary) hypertension: Secondary | ICD-10-CM | POA: Diagnosis present

## 2024-06-15 DIAGNOSIS — I639 Cerebral infarction, unspecified: Secondary | ICD-10-CM | POA: Diagnosis not present

## 2024-06-15 DIAGNOSIS — I480 Paroxysmal atrial fibrillation: Secondary | ICD-10-CM | POA: Diagnosis present

## 2024-06-15 DIAGNOSIS — E785 Hyperlipidemia, unspecified: Secondary | ICD-10-CM | POA: Diagnosis present

## 2024-06-15 LAB — I-STAT CG4 LACTIC ACID, ED: Lactic Acid, Venous: 1.9 mmol/L (ref 0.5–1.9)

## 2024-06-15 LAB — TROPONIN I (HIGH SENSITIVITY)
Troponin I (High Sensitivity): 6 ng/L (ref <18)
Troponin I (High Sensitivity): 6 ng/L (ref <18)

## 2024-06-15 LAB — CBC WITH DIFFERENTIAL/PLATELET
Abs Immature Granulocytes: 0.07 K/uL (ref 0.00–0.07)
Basophils Absolute: 0 K/uL (ref 0.0–0.1)
Basophils Relative: 0 %
Eosinophils Absolute: 0 K/uL (ref 0.0–0.5)
Eosinophils Relative: 0 %
HCT: 44 % (ref 39.0–52.0)
Hemoglobin: 14.8 g/dL (ref 13.0–17.0)
Immature Granulocytes: 0 %
Lymphocytes Relative: 9 %
Lymphs Abs: 1.7 K/uL (ref 0.7–4.0)
MCH: 29.1 pg (ref 26.0–34.0)
MCHC: 33.6 g/dL (ref 30.0–36.0)
MCV: 86.4 fL (ref 80.0–100.0)
Monocytes Absolute: 1.3 K/uL — ABNORMAL HIGH (ref 0.1–1.0)
Monocytes Relative: 8 %
Neutro Abs: 14.6 K/uL — ABNORMAL HIGH (ref 1.7–7.7)
Neutrophils Relative %: 83 %
Platelets: 134 K/uL — ABNORMAL LOW (ref 150–400)
RBC: 5.09 MIL/uL (ref 4.22–5.81)
RDW: 12.8 % (ref 11.5–15.5)
WBC: 17.8 K/uL — ABNORMAL HIGH (ref 4.0–10.5)
nRBC: 0 % (ref 0.0–0.2)

## 2024-06-15 LAB — CBC
HCT: 42.5 % (ref 39.0–52.0)
Hemoglobin: 14.6 g/dL (ref 13.0–17.0)
MCH: 29.1 pg (ref 26.0–34.0)
MCHC: 34.4 g/dL (ref 30.0–36.0)
MCV: 84.8 fL (ref 80.0–100.0)
Platelets: 155 K/uL (ref 150–400)
RBC: 5.01 MIL/uL (ref 4.22–5.81)
RDW: 13 % (ref 11.5–15.5)
WBC: 16.2 K/uL — ABNORMAL HIGH (ref 4.0–10.5)
nRBC: 0 % (ref 0.0–0.2)

## 2024-06-15 LAB — COMPREHENSIVE METABOLIC PANEL WITH GFR
ALT: 19 U/L (ref 0–44)
AST: 25 U/L (ref 15–41)
Albumin: 3.6 g/dL (ref 3.5–5.0)
Alkaline Phosphatase: 84 U/L (ref 38–126)
Anion gap: 13 (ref 5–15)
BUN: 37 mg/dL — ABNORMAL HIGH (ref 8–23)
CO2: 21 mmol/L — ABNORMAL LOW (ref 22–32)
Calcium: 9.2 mg/dL (ref 8.9–10.3)
Chloride: 104 mmol/L (ref 98–111)
Creatinine, Ser: 1.29 mg/dL — ABNORMAL HIGH (ref 0.61–1.24)
GFR, Estimated: 55 mL/min — ABNORMAL LOW (ref >60)
Glucose, Bld: 127 mg/dL — ABNORMAL HIGH (ref 70–99)
Potassium: 4.5 mmol/L (ref 3.5–5.1)
Sodium: 138 mmol/L (ref 135–145)
Total Bilirubin: 1 mg/dL (ref 0.0–1.2)
Total Protein: 6.6 g/dL (ref 6.5–8.1)

## 2024-06-15 LAB — I-STAT CHEM 8, ED
BUN: 36 mg/dL — ABNORMAL HIGH (ref 8–23)
Calcium, Ion: 1.1 mmol/L — ABNORMAL LOW (ref 1.15–1.40)
Chloride: 104 mmol/L (ref 98–111)
Creatinine, Ser: 1.3 mg/dL — ABNORMAL HIGH (ref 0.61–1.24)
Glucose, Bld: 129 mg/dL — ABNORMAL HIGH (ref 70–99)
HCT: 45 % (ref 39.0–52.0)
Hemoglobin: 15.3 g/dL (ref 13.0–17.0)
Potassium: 4.1 mmol/L (ref 3.5–5.1)
Sodium: 141 mmol/L (ref 135–145)
TCO2: 23 mmol/L (ref 22–32)

## 2024-06-15 LAB — CREATININE, SERUM
Creatinine, Ser: 1.26 mg/dL — ABNORMAL HIGH (ref 0.61–1.24)
GFR, Estimated: 57 mL/min — ABNORMAL LOW (ref >60)

## 2024-06-15 LAB — CK: Total CK: 109 U/L (ref 49–397)

## 2024-06-15 LAB — ETHANOL: Alcohol, Ethyl (B): <15 mg/dL (ref <15)

## 2024-06-15 LAB — PROTIME-INR
INR: 1 (ref 0.8–1.2)
Prothrombin Time: 14 s (ref 11.4–15.2)

## 2024-06-15 LAB — BRAIN NATRIURETIC PEPTIDE: B Natriuretic Peptide: 242.6 pg/mL — ABNORMAL HIGH (ref 0.0–100.0)

## 2024-06-15 MED ORDER — LORAZEPAM 2 MG/ML IJ SOLN
1.0000 mg | Freq: Once | INTRAMUSCULAR | Status: AC
  Administered 2024-06-15: 1 mg via INTRAVENOUS
  Filled 2024-06-15: qty 1

## 2024-06-15 MED ORDER — MECLIZINE HCL 25 MG PO TABS
25.0000 mg | ORAL_TABLET | Freq: Once | ORAL | Status: AC
  Administered 2024-06-15: 25 mg via ORAL
  Filled 2024-06-15: qty 1

## 2024-06-15 MED ORDER — STROKE: EARLY STAGES OF RECOVERY BOOK
Freq: Once | Status: AC
  Filled 2024-06-15: qty 1

## 2024-06-15 MED ORDER — ATORVASTATIN CALCIUM 10 MG PO TABS
20.0000 mg | ORAL_TABLET | Freq: Every day | ORAL | Status: DC
  Administered 2024-06-16 – 2024-06-21 (×6): 20 mg via ORAL
  Filled 2024-06-15 (×6): qty 2

## 2024-06-15 MED ORDER — IOHEXOL 350 MG/ML SOLN
75.0000 mL | Freq: Once | INTRAVENOUS | Status: AC | PRN
  Administered 2024-06-15: 75 mL via INTRAVENOUS

## 2024-06-15 MED ORDER — ACETAMINOPHEN 325 MG PO TABS
650.0000 mg | ORAL_TABLET | ORAL | Status: DC | PRN
  Administered 2024-06-15: 650 mg via ORAL
  Filled 2024-06-15: qty 2

## 2024-06-15 MED ORDER — MECLIZINE HCL 25 MG PO TABS
25.0000 mg | ORAL_TABLET | Freq: Three times a day (TID) | ORAL | Status: DC | PRN
  Administered 2024-06-16: 25 mg via ORAL
  Filled 2024-06-15: qty 1

## 2024-06-15 MED ORDER — OXYCODONE HCL 5 MG PO TABS
2.5000 mg | ORAL_TABLET | Freq: Four times a day (QID) | ORAL | Status: DC | PRN
  Administered 2024-06-15: 2.5 mg via ORAL
  Filled 2024-06-15: qty 1

## 2024-06-15 MED ORDER — ASPIRIN 81 MG PO CHEW
81.0000 mg | CHEWABLE_TABLET | Freq: Every day | ORAL | Status: DC
  Administered 2024-06-15 – 2024-06-16 (×2): 81 mg via ORAL
  Filled 2024-06-15 (×2): qty 1

## 2024-06-15 MED ORDER — SODIUM CHLORIDE 0.9 % IV SOLN: INTRAVENOUS | Status: AC

## 2024-06-15 MED ORDER — ACETAMINOPHEN 160 MG/5ML PO SOLN: 650.0000 mg | ORAL | Status: DC | PRN

## 2024-06-15 MED ORDER — SENNOSIDES-DOCUSATE SODIUM 8.6-50 MG PO TABS
1.0000 | ORAL_TABLET | Freq: Every evening | ORAL | Status: DC | PRN
  Administered 2024-06-18 – 2024-06-20 (×2): 1 via ORAL
  Filled 2024-06-15 (×2): qty 1

## 2024-06-15 MED ORDER — ENOXAPARIN SODIUM 40 MG/0.4ML IJ SOSY
40.0000 mg | PREFILLED_SYRINGE | INTRAMUSCULAR | Status: DC
  Administered 2024-06-15 – 2024-06-17 (×3): 40 mg via SUBCUTANEOUS
  Filled 2024-06-15 (×3): qty 0.4

## 2024-06-15 MED ORDER — ONDANSETRON HCL 4 MG/2ML IJ SOLN
4.0000 mg | Freq: Once | INTRAMUSCULAR | Status: AC
  Administered 2024-06-15: 4 mg via INTRAVENOUS
  Filled 2024-06-15: qty 2

## 2024-06-15 MED ORDER — ACETAMINOPHEN 325 MG PO TABS
650.0000 mg | ORAL_TABLET | Freq: Four times a day (QID) | ORAL | Status: DC | PRN
  Administered 2024-06-15 – 2024-06-20 (×8): 650 mg via ORAL
  Filled 2024-06-15 (×8): qty 2

## 2024-06-15 MED ORDER — DIAZEPAM 5 MG PO TABS: 10.0000 mg | ORAL_TABLET | Freq: Two times a day (BID) | ORAL | Status: DC | PRN

## 2024-06-15 MED ORDER — ACETAMINOPHEN 650 MG RE SUPP: 650.0000 mg | RECTAL | Status: DC | PRN

## 2024-06-15 NOTE — Consult Note (Signed)
 NEUROLOGY CONSULT NOTE   Date of service: June 15, 2024 Patient Name: Dustin Ashley MRN:  [id number] DOB:  Nov 17, 1940 Chief Complaint: Sudden onset dizziness with nausea and vomiting Requesting Provider: Laurice Maude BROCKS, MD  History of Present Illness  [name] is a 83 y.o. male with hx of A-fib not on anticoagulation, renal artery stenosis, CAD with CABG, hypertension and hyperlipidemia presents with sudden onset dizziness as well as nausea and vomiting.  Patient reports that last night he was in his usual state of health and was on the commode when the room started spinning and he fell to the floor.  He stated that he then vomited about 7 or 8 times and eventually was able to get to a phone and call his family for help.  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  LKW: 12/1 2300 Modified rankin score: 1-No significant post stroke disability and can perform usual duties with stroke symptoms IV Thrombolysis: No, outside of window EVT: No, completed stroke seen on CT and exam not consistent with LVO  NIHSS components Score: Comment  1a Level of Conscious 0[x]  1[]  2[]  3[]      1b LOC Questions 0[x]  1[]  2[]       1c LOC Commands 0[x]  1[]  2[]       2 Best Gaze 0[x]  1[]  2[]       3 Visual 0[x]  1[]  2[]  3[]      4 Facial Palsy 0[x]  1[]  2[]  3[]      5a Motor Arm - left 0[x]  1[]  2[]  3[]  4[]  UN[]    5b Motor Arm - Right 0[x]  1[]  2[]  3[]  4[]  UN[]    6a Motor Leg - Left 0[x]  1[]  2[]  3[]  4[]  UN[]    6b Motor Leg - Right 0[x]  1[]  2[]  3[]  4[]  UN[]    7 Limb Ataxia 0[]  1[x]  2[]  UN[]      8 Sensory 0[x]  1[]  2[]  UN[]      9 Best Language 0[x]  1[]  2[]  3[]      10 Dysarthria 0[x]  1[]  2[]  UN[]      11 Extinct. and Inattention 0[x]  1[]  2[]       TOTAL:1       ROS  Focused ROS negative for any additional complaints  Past History   Past Medical History:  Diagnosis Date   Allergic rhinitis    Allergy    seasonal    Arthritis    Bell palsy    CAD (coronary artery disease)    Cataract    Chronic renal insufficiency, stage II (mild)    Cr ~ 1.4, u/s 4-12 normal kidneys   Gallstone pancreatitis 2015   GERD (gastroesophageal reflux disease)    HOH (hard of hearing)    has a hearing aid L, sees audiology rountinely   Hyperglycemia    A1C 5.8 04-2010   Hyperlipemia    Hypertension    Pain, joint, multiple sites    uses valium , occ uses for insomnia. uses for shoulder  pain   Paroxysmal atrial fibrillation [facility])    Personal history of colonic polyps    Pulmonary emboli (HCC)    02-2014   RAS (renal artery stenosis) 05-2012    Renal insufficiency    SCC (squamous cell carcinoma)     Past Surgical History:  Procedure Laterality Date   CARDIAC CATHETERIZATION     CATARACT EXTRACTION     CHOLECYSTECTOMY N/A 02/15/2014   Procedure: LAPAROSCOPIC CHOLECYSTECTOMY with IOC;  Surgeon: Camellia CHRISTELLA Blush, MD;  Location:  [facility] OR;  Service: General;  Laterality: N/A;   COLONOSCOPY     CORONARY ARTERY BYPASS GRAFT  1995   ESOPHAGOGASTRODUODENOSCOPY N/A 03/02/2014   Procedure: ESOPHAGOGASTRODUODENOSCOPY (EGD);  Surgeon: Gwendlyn ONEIDA Buddy, MD;  [facility] ENDOSCOPY;  Service: Endoscopy;  Laterality: N/A;  sarah/leone    Family History: Family History  Problem Relation Age of Onset   Hypertension Father    Heart attack Brother        2 brothers, CABG   Diabetes Neg Hx    Colon cancer Neg Hx    Prostate cancer Neg Hx        colon, prostate   Rectal cancer Neg Hx    Stomach cancer Neg Hx     Social History  reports that he quit smoking about 48 years ago. His smoking use included cigarettes. He has never used smokeless tobacco. He reports that he does not drink alcohol and does not use drugs.  Allergies  Allergen Reactions   Hydrocodone -Acetaminophen  Other (See Comments)    REACTION: bladder obstruction   Tramadol Hcl Other (See Comments)    REACTION: bladder obstruction    Medications  No current  facility-administered medications for this encounter.  Current Outpatient Medications:    amLODipine  (NORVASC ) 5 MG tablet, Take 1 tablet (5 mg total) by mouth daily., Disp: 180 tablet, Rfl: 3   apixaban  (ELIQUIS ) 2.5 MG TABS tablet, Take 1 tablet (2.5 mg total) by mouth 2 (two) times daily. (Patient not taking: Reported on 05/11/2024), Disp: 60 tablet, Rfl: 0   atenolol  (TENORMIN ) 50 MG tablet, Take 1.5 tablets (75 mg total) by mouth daily., Disp: 135 tablet, Rfl: 1   atorvastatin  (LIPITOR) 20 MG tablet, TAKE 1 TABLET BY MOUTH EVERY DAY, Disp: 90 tablet, Rfl: 1   benazepril  (LOTENSIN ) 40 MG tablet, Take 0.5 tablets (20 mg total) by mouth 2 (two) times daily., Disp: , Rfl:    benzonatate  (TESSALON  PERLES) 100 MG capsule, Take 1 capsule (100 mg total) by mouth 3 (three) times daily as needed for cough., Disp: 21 capsule, Rfl: 0   diazepam  (VALIUM ) 10 MG tablet, Take 1 tablet (10 mg total) by mouth every 12 (twelve) hours as needed for anxiety or sleep., Disp: 60 tablet, Rfl: 3   fexofenadine (ALLEGRA) 180 MG tablet, Take 180 mg by mouth daily., Disp: , Rfl:   Vitals   Vitals:   06/15/24 1049 06/15/24 1114 06/15/24 1402  BP: (!) 154/87  136/69  Pulse: 93  99  Resp: 16  17  Temp: 98.3 F (36.8 C)    TempSrc: Oral    SpO2: 98%  97%  Weight:  90.7 kg   Height:  5' 9 (1.753 m)     Body mass index is 29.53 kg/m.   Physical Exam   Constitutional: Appears well-developed and well-nourished elderly patient resting with eyes closed.  Psych: Affect appropriate to situation.  Eyes: No scleral injection.  HENT: No OP obstruction.  Hard of hearing Head: Normocephalic.  Cardiovascular: Normal rate and regular rhythm.  Respiratory: Effort normal, non-labored breathing.  Skin: WDI.   Neurologic Examination    NEURO:  Mental Status: AA&Ox3, able to give clear and coherent history of present illness Speech/Language: speech is without dysarthria or aphasia.   Cranial Nerves:  II: PERRL.  Visual fields full.  III, IV, VI: EOMI. horizontal nystagmus noted V: Sensation is intact to light touch and symmetrical to face.  VII: Smile is symmetrical.  VIII: hearing intact to voice. IX, X:  Phonation is normal.  XII: tongue is midline without fasciculations. Motor: Able to move all 4 extremities with antigravity strength Tone: is normal and bulk is normal Sensation- Intact to light touch bilaterally.   Coordination: FTN intact bilaterally, with ataxia in left lower extremity Gait- deferred   Labs/Imaging/Neurodiagnostic studies   CBC:  Recent Labs  Lab 06/24/2024 1152 06/24/2024 1202  WBC 17.8*  --   NEUTROABS 14.6*  --   HGB 14.8 15.3  HCT 44.0 45.0  MCV 86.4  --   PLT 134*  --    Basic Metabolic Panel:  Lab Results  Component Value Date   NA 141 2024-06-24   K 4.1 2024-06-24   CO2 21 (L) 2024/06/24   GLUCOSE 129 (H) June 24, 2024   BUN 36 (H) 2024-06-24   CREATININE 1.30 (H) 2024/06/24   CALCIUM  9.2 06/24/24   GFRNONAA 55 (L) 06-24-24   GFRAA >60 03/27/2016   Lipid Panel:  Lab Results  Component Value Date   LDLCALC 59 10/13/2023   HgbA1c:  Lab Results  Component Value Date   HGBA1C 5.7 04/24/2023   Urine Drug Screen: No results found for: LABOPIA, COCAINSCRNUR, LABBENZ, AMPHETMU, THCU, LABBARB  Alcohol Level     Component Value Date/Time   [facility] <15 2024/06/24 1152   INR  Lab Results  Component Value Date   INR 1.0 2024/06/24   CT Head without contrast(Personally reviewed): Acute to subacute infarcts in posterior medial right temporal lobe, right occipital lobe, right cerebellum and potentially within the brainstem  CT angio Head and Neck with contrast(Personally reviewed): Pending  MRI Brain(Personally reviewed): Pending   ASSESSMENT   YEHONATAN GRANDISON is a 83 y.o. male with hx of A-fib not on anticoagulation, renal artery stenosis, CAD with CABG, hypertension and hyperlipidemia who presents with sudden onset dizziness,  nausea and vomiting.  Symptoms occurred suddenly while patient was on the commode last night.  He reports that he was told that he has atrial fibrillation and was prescribed Eliquis  but stopped taking it.  On exam, he is noted to have horizontal nystagmus as well as ataxia in the left lower extremity.  CT head shows multiple acute infarcts, largest in right cerebellum.  Will obtain brain MRI to further characterize these infarcts.  There is some concern for swelling around the cerebellar infarct, so may admit patient to ICU for closer monitoring and potentially hypertonic saline.  Given potential for hemorrhagic transformation, will start aspirin  at this time and will potentially resume Eliquis  later this week.  RECOMMENDATIONS  Stroke/TIA Workup  - Admit for stroke workup - Permissive HTN x48 hrs from sx onset goal BP <220/110. PRN labetalol or hydralazine  if BP above these parameters. Avoid oral antihypertensives. - MRI brain wo contrast - CTA head and neck - TTE w/ bubble - Check A1c and LDL + add statin per guidelines - Aspirin  81 mg daily antiplt/anticoag - q4 hr neuro checks - STAT head CT for any change in neuro exam - Tele - PT/OT/SLP - Stroke education - Amb referral to neurology upon discharge   ______________________________________________________________________  Patient seen by NP with MD, MD to edit note as needed.  Signed, Cortney E Everitt Clint Kill, NP [facility]   NEUROHOSPITALIST ADDENDUM Performed a face to face diagnostic evaluation.   I have reviewed the contents of history and physical exam as documented by PA/ARNP/Resident and agree with above documentation.  I have discussed and formulated the above plan as documented. Edits to the note have been made  as needed.  Impression/Key exam findings/Plan: endorses sudden vertigo, vomiting while on the toilet. This occurred at 2330 last night. Was on floor all night and then in AM, finally able to crawl to the  phone and call his family around 9AM. 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  He denies any headache, vertigo is improved, no significant edema noted, no concern for obstructive hydrocephalus at this time. He is awake, he is hard of hearing. I think he can be admitted to step down with Q2 Neuro checks. Stroke workup ordered but suspect that the likely etiology of stroke is afibb and not taking eliquis . No clear reason for not taking eliquis  except that he is hard headed per family. His cardiologist outpatient did discuss with him about risk of stroke if he is non compliant with eliquis . Given the size of his stroke, I would hold eliquis  tonight and tomorrow and consider starting on 12/4 in AM. Stroke team to follow starting tomorrow. Patient is agreeable to taking eliquis  at discharge.  Plan discussed with Dr. Laurice with the ED team, plan discussed with patient and family at the bedside.  I personally spent a total of 80 minutes in the care of the patient today including preparing to see the patient, getting/reviewing separately obtained history, performing a medically appropriate exam/evaluation, counseling and educating, referring and communicating with other health care professionals, documenting clinical information in the EHR, independently interpreting results, communicating results, and coordinating care.   Casaundra Takacs, MD [facility] [phone]   If 7pm to 7am, please call on call as listed on AMION.

## 2024-06-15 NOTE — ED Provider Notes (Signed)
 [place] EMERGENCY DEPARTMENT AT [facility] Provider Note   CSN: [id number] Arrival date & time: 06/15/24  1049     Patient presents with: Dizziness   Dustin Ashley is a 83 y.o. male.   83 year old male with prior medical history as detailed below presents for evaluation.  Patient reports acute onset dizziness symptoms last night around 1130.  Patient reports several vomiting episodes related to the profound sense of spinning.  He was unable to walk since 1130.  Over the course of the night he was eventually able to crawl to a phone and called for help.  Patient denies focal weakness.  He denies headache.  He denies fever.  He reports that his symptoms are worse with standing or with rapid movement of his head.  He has a history of A-fib.  He is not anticoagulated for same.  Past medical history includes atrial fibrillation (not on anticoagulation), renal artery stenosis, coronary artery disease with remote CABG, hypertension, and mixed hyperlipidemia.  The history is provided by the patient and medical records.       Prior to Admission medications   Medication Sig Start Date End Date Taking? Authorizing Provider  amLODipine  (NORVASC ) 5 MG tablet Take 1 tablet (5 mg total) by mouth daily. 03/09/24 06/07/24  Lucien Orren SAILOR, PA-C  apixaban  (ELIQUIS ) 2.5 MG TABS tablet Take 1 tablet (2.5 mg total) by mouth 2 (two) times daily. Patient not taking: Reported on 05/11/2024 03/11/24   Wonda Sharper, MD  atenolol  (TENORMIN ) 50 MG tablet Take 1.5 tablets (75 mg total) by mouth daily. 03/31/24   Amon Aloysius BRAVO, MD  atorvastatin  (LIPITOR) 20 MG tablet TAKE 1 TABLET BY MOUTH EVERY DAY 03/28/24   Webb, Padonda B, FNP  benazepril  (LOTENSIN ) 40 MG tablet Take 0.5 tablets (20 mg total) by mouth 2 (two) times daily. 05/11/24   Lucien Orren SAILOR, PA-C  benzonatate  (TESSALON  PERLES) 100 MG capsule Take 1 capsule (100 mg total) by mouth 3 (three) times daily as needed for cough. 04/12/24   Amon Aloysius BRAVO, MD  diazepam  (VALIUM ) 10 MG tablet Take 1 tablet (10 mg total) by mouth every 12 (twelve) hours as needed for anxiety or sleep. 04/19/24   Paz, Jose E, MD  fexofenadine (ALLEGRA) 180 MG tablet Take 180 mg by mouth daily.    [provider]    Allergies: Hydrocodone -acetaminophen  and Tramadol hcl    Review of Systems  All other systems reviewed and are negative.   Updated Vital Signs BP (!) 154/87 (BP Location: Right Arm)   Pulse 93   Temp 98.3 F (36.8 C) (Oral)   Resp 16   SpO2 98%   Physical Exam Vitals and nursing note reviewed.  Constitutional:      General: He is not in acute distress.    Appearance: He is well-developed.  HENT:     Head: Normocephalic and atraumatic.  Eyes:     Conjunctiva/sclera: Conjunctivae normal.  Cardiovascular:     Rate and Rhythm: Normal rate and regular rhythm.     Heart sounds: No murmur heard. Pulmonary:     Effort: Pulmonary effort is normal. No respiratory distress.     Breath sounds: Normal breath sounds.  Abdominal:     Palpations: Abdomen is soft.     Tenderness: There is no abdominal tenderness.  Musculoskeletal:        General: No swelling.     Cervical back: Neck supple.  Skin:    General: Skin is  warm and dry.     Capillary Refill: Capillary refill takes less than 2 seconds.  Neurological:     General: No focal deficit present.     Mental Status: He is alert and oriented to person, place, and time. Mental status is at baseline.     Cranial Nerves: No cranial nerve deficit.     Comments: Bilateral horizontal nystagmus noted  Psychiatric:        Mood and Affect: Mood normal.     (all labs ordered are listed, but only abnormal results are displayed) Labs Reviewed  COMPREHENSIVE METABOLIC PANEL WITH GFR  CBC WITH DIFFERENTIAL/PLATELET  BRAIN NATRIURETIC PEPTIDE  URINALYSIS, W/ REFLEX TO CULTURE (INFECTION SUSPECTED)  RAPID URINE DRUG SCREEN, HOSP PERFORMED  PROTIME-INR  ETHANOL  I-STAT CHEM 8, ED   I-STAT CG4 LACTIC ACID, ED  TROPONIN I (HIGH SENSITIVITY)    EKG: EKG Interpretation Date/Time:  Tuesday June 15 2024 10:50:07 EST Ventricular Rate:  75 PR Interval:    QRS Duration:  103 QT Interval:  458 QTC Calculation: 512 R Axis:   51  Text Interpretation: Atrial fibrillation Prolonged QT interval Confirmed by Laurice Coy [phone]) on 06/15/2024 11:11:24 AM  Radiology: No results found.   Procedures   Medications Ordered in the ED - No data to display                                  Medical Decision Making Patient with history of paroxysmal A-fib, not on anticoagulation, presents with acute onset vertiginous symptoms.  Symptoms began around 11 PM last night.  With meclizine and Zofran  the patient feels much improved.  CT head suggests subacute to acute infarct.    Case discussed with neurology - Dr. Vanessa -who  will see the patient in the ED.  Admission pending Neuro evaluation.   Amount and/or Complexity of Data Reviewed Labs: ordered. Radiology: ordered.  Risk Prescription drug management.   CRITICAL CARE Performed by: Coy JAYSON Laurice   Total critical care time: 30 minutes  Critical care time was exclusive of separately billable procedures and treating other patients.  Critical care was necessary to treat or prevent imminent or life-threatening deterioration.  Critical care was time spent personally by me on the following activities: development of treatment plan with patient and/or surrogate as well as nursing, discussions with consultants, evaluation of patient's response to treatment, examination of patient, obtaining history from patient or surrogate, ordering and performing treatments and interventions, ordering and review of laboratory studies, ordering and review of radiographic studies, pulse oximetry and re-evaluation of patient's condition.      Final diagnoses:  Cerebrovascular accident (CVA), unspecified mechanism [facility])     ED Discharge Orders     None          Laurice Coy JAYSON, MD 06/15/24 1408

## 2024-06-15 NOTE — Progress Notes (Addendum)
 Bladder scanned for 769 ml. In/out cathed for 1000 ml.

## 2024-06-15 NOTE — Hospital Course (Signed)
 Patient is a 83 year old male with past medical history of atrial fibrillation not on anticoagulation, history of hypertension, hyperlipidemia, history of coronary artery disease status post CABG and renal artery stenosis presented to hospital with sudden onset of dizziness around 11:30 PM yesterday night.  It was also accompanied by nausea and multiple episodes of vomiting vomiting with some vertigo.  Patient denied any fever, chills or rigor.  Denied any headache, shortness of breath, chest pain or dyspnea.  Denied any focal weakness.  He then had a fall.   Patient was then brought into the hospital.  In the ED blood pressure was slightly elevated.  Labs were notable for creatinine elevation at 1.3.  Lactate of 1.9.  WBC elevated at 17.8.  CK level was 109.  BNP of 242 CT head scan showed acute infarct in the posterior middle right temporal lobe occipital lobe and right cerebellum.  Code stroke was called in and neurology saw the patient.  Patient was then considered for admission to hospital for further evaluation and treatment   Acute possible embolic stroke.  Seen by neurology.  Admitting for stroke workup.  Continue permissive hypertension for 48 hours.  Check MRI of the brain CTA of the head and neck.  Check 2D echocardiogram with bubble study.  Check hemoglobin A1c LDL.  Will continue aspirin  as per neurology.  Will also get PT, OT, speech therapy evaluation.  Atrial fibrillation not on anticoagulation.  Will continue telemetry monitoring.  Essential hypertension.  Continue to monitor blood pressure closely.  Will be on permissive hypertension at this time.  Hyperlipidemia.  Continue statins.  History of CAD status post CABG, renal artery stenosis.  Continue aspirin  and statins

## 2024-06-15 NOTE — ED Notes (Addendum)
 Pt in bed, pt states that he needs to void, pt states that he wants to stand to void, upon sitting pt got very dizzy and flushed, pt did not loose consciousness but reported increased dizziness, pt back to a supine/semi fowlers position.  Pt states that dizziness is now better and he will attempt to void later. Family at bedside. MD at bedside

## 2024-06-15 NOTE — ED Triage Notes (Addendum)
 Pt to er via ems, pt states that he is here because last night he had an episode of dizziness/vertigo.  Pt states that he vomited a lot.  Pt denies weakness.  MD at bedside

## 2024-06-15 NOTE — ED Notes (Signed)
 Pt in MRI.

## 2024-06-15 NOTE — H&P (Signed)
 [facility] History and Physical  Dustin Ashley FMW:[id number] DOB: 11-05-1940 DOA: 06/15/2024  Referring physician: ED  PCP: Amon Aloysius BRAVO, MD   Patient is coming from: Home  Chief Complaint: Dizziness  HPI:  Patient is a 83 year old male with past medical history of atrial fibrillation not on anticoagulation, history of hypertension, hyperlipidemia, history of coronary artery disease status post CABG and renal artery stenosis presented to hospital with sudden onset of dizziness around 11:30 PM yesterday night.  It was also accompanied by nausea and multiple episodes of vomiting with vertigo.  Vertigo worse with movement of the head and position.  Patient denied any fever, chills or rigor.  Denied any headache, shortness of breath, chest pain or dyspnea.  Denied any focal weakness.  He then had a fall.  Patient denied any urinary urgency, frequency or dysuria.  Patient was then brought into the hospital.  In the ED, blood pressure was slightly elevated.  Labs were notable for creatinine elevation at 1.3.  Lactate of 1.9.  WBC elevated at 17.8.  CK level was 109.  BNP of 242.  CT head scan showed acute infarct in the posterior middle right temporal lobe occipital lobe and right cerebellum.  Code stroke was called in and neurology saw the patient.  Patient was then considered for admission to hospital for further evaluation and treatment  Assessment and Plan Principal Problem:   Acute stroke due to ischemia [facility])  Acute possible embolic stroke. Seen by neurology.  Admitting for stroke workup.  Continue permissive hypertension for 48 hours.  MRI of the brain showed multiple posterior circulation infarct of varying ages with a large acute to early subacute right cerebellar infarct and acute to early subacute infarct in the left temporal parietal region and large subacute right PCA infarct.  Check CTA of the head and neck.  Check 2D echocardiogram with bubble study.  Check hemoglobin A1c, LDL.   Will continue aspirin  as per neurology.  Will also get PT, OT, speech therapy evaluation.  Vertigo.  Will continue meclizine as needed.  Mostly positional.  PT to evaluate.  Atrial fibrillation not on anticoagulation.  Will continue telemetry monitoring.  Currently on aspirin .  Follow neurology recommendations  Essential hypertension.  Continue to monitor blood pressure closely.  Will be on permissive hypertension at this time.  Hyperlipidemia.  Continue Lipitor 20 20 mg.  Check fasting lipid profile.  History of insomnia.  On diazepam .  Will continue  History of CAD status post CABG, renal artery stenosis.  Continue aspirin  and statins   DVT Prophylaxis: Lovenox    Review of Systems:  All systems were reviewed and were negative unless otherwise mentioned in the HPI   Past Medical History:  Diagnosis Date   Allergic rhinitis    Allergy    seasonal   Arthritis    Bell palsy    CAD (coronary artery disease)    Cataract    Chronic renal insufficiency, stage II (mild)    Cr ~ 1.4, u/s 4-12 normal kidneys   Gallstone pancreatitis 2015   GERD (gastroesophageal reflux disease)    HOH (hard of hearing)    has a hearing aid L, sees audiology rountinely   Hyperglycemia    A1C 5.8 04-2010   Hyperlipemia    Hypertension    Pain, joint, multiple sites    uses valium , occ uses for insomnia. uses for shoulder  pain   Paroxysmal atrial fibrillation (HCC)    Personal history of colonic polyps  Pulmonary emboli (HCC)    02-2014   RAS (renal artery stenosis) 05-2012    Renal insufficiency    SCC (squamous cell carcinoma)    Past Surgical History:  Procedure Laterality Date   CARDIAC CATHETERIZATION     CATARACT EXTRACTION     CHOLECYSTECTOMY N/A 02/15/2014   Procedure: LAPAROSCOPIC CHOLECYSTECTOMY with IOC;  Surgeon: Camellia CHRISTELLA Blush, MD;  Location: [facility] OR;  Service: General;  Laterality: N/A;   COLONOSCOPY     CORONARY ARTERY BYPASS GRAFT  1995   ESOPHAGOGASTRODUODENOSCOPY N/A  03/02/2014   Procedure: ESOPHAGOGASTRODUODENOSCOPY (EGD);  Surgeon: Gwendlyn ONEIDA Buddy, MD;  Location: [facility] ENDOSCOPY;  Service: Endoscopy;  Laterality: N/A;  sarah/leone    Social History:  reports that he quit smoking about 48 years ago. His smoking use included cigarettes. He has never used smokeless tobacco. He reports that he does not drink alcohol and does not use drugs.  Allergies  Allergen Reactions   Hydrocodone -Acetaminophen  Other (See Comments)    REACTION: bladder obstruction   Tramadol Hcl Other (See Comments)    REACTION: bladder obstruction    Family History  Problem Relation Age of Onset   Hypertension Father    Heart attack Brother        2 brothers, CABG   Diabetes Neg Hx    Colon cancer Neg Hx    Prostate cancer Neg Hx        colon, prostate   Rectal cancer Neg Hx    Stomach cancer Neg Hx      Prior to Admission medications   Medication Sig Start Date End Date Taking? Authorizing Provider  amLODipine  (NORVASC ) 5 MG tablet Take 1 tablet (5 mg total) by mouth daily. 03/09/24 06/07/24  Lucien Orren SAILOR, PA-C  apixaban  (ELIQUIS ) 2.5 MG TABS tablet Take 1 tablet (2.5 mg total) by mouth 2 (two) times daily. Patient not taking: Reported on 05/11/2024 03/11/24   Wonda Sharper, MD  atenolol  (TENORMIN ) 50 MG tablet Take 1.5 tablets (75 mg total) by mouth daily. 03/31/24   Amon Aloysius BRAVO, MD  atorvastatin  (LIPITOR) 20 MG tablet TAKE 1 TABLET BY MOUTH EVERY DAY 03/28/24   Webb, Padonda B, FNP  benazepril  (LOTENSIN ) 40 MG tablet Take 0.5 tablets (20 mg total) by mouth 2 (two) times daily. 05/11/24   Lucien Orren SAILOR, PA-C  benzonatate  (TESSALON  PERLES) 100 MG capsule Take 1 capsule (100 mg total) by mouth 3 (three) times daily as needed for cough. 04/12/24   Amon Aloysius BRAVO, MD  diazepam  (VALIUM ) 10 MG tablet Take 1 tablet (10 mg total) by mouth every 12 (twelve) hours as needed for anxiety or sleep. 04/19/24   Paz, Jose E, MD  fexofenadine (ALLEGRA) 180 MG tablet Take 180 mg by mouth daily.     [provider]    Physical Exam:  Vitals:   06/15/24 1445 06/15/24 1458 06/15/24 1500 06/15/24 1639  BP: (!) 141/77 (!) 168/69 (!) 152/80 (!) 158/77  Pulse: (!) 102 94 (!) 102 89  Resp: 16 17 18 18   Temp:  (!) 97.5 F (36.4 C)  (!) 97.5 F (36.4 C)  TempSrc:  Oral  Oral  SpO2: 95% 98% 98% 97%  Weight:      Height:       Wt Readings from Last 3 Encounters:  06/15/24 90.7 kg  05/11/24 88.8 kg  04/12/24 88.9 kg   Body mass index is 29.53 kg/m.  General:  Average built, not in obvious distress, elderly male, alert awake and  oriented, Communicative, hard of hearing HENT: Normocephalic, No scleral pallor or icterus noted. Oral mucosa is moist.  Chest:  Clear breath sounds.  . No crackles or wheezes.  CVS: S1 &S2 heard. No murmur.  Regular rate and rhythm. Abdomen: Soft, nontender, nondistended.  Bowel sounds are heard. No abdominal mass palpated Extremities: No cyanosis, clubbing or edema.  Peripheral pulses are palpable. Psych: Alert, awake and oriented, normal mood CNS:  No cranial nerve deficits.  Power equal in all extremities.  Moves all extremities.  Did not test gait Skin: Warm and dry.  No rashes noted.  Labs on Admission:   CBC: Recent Labs  Lab 06/15/24 1152 06/15/24 1202  WBC 17.8*  --   NEUTROABS 14.6*  --   HGB 14.8 15.3  HCT 44.0 45.0  MCV 86.4  --   PLT 134*  --     Basic Metabolic Panel: Recent Labs  Lab 06/15/24 1152 06/15/24 1202  NA 138 141  K 4.5 4.1  CL 104 104  CO2 21*  --   GLUCOSE 127* 129*  BUN 37* 36*  CREATININE 1.29* 1.30*  CALCIUM  9.2  --     Liver Function Tests: Recent Labs  Lab 06/15/24 1152  AST 25  ALT 19  ALKPHOS 84  BILITOT 1.0  PROT 6.6  ALBUMIN 3.6   No results for input(s): LIPASE, AMYLASE in the last 168 hours. No results for input(s): AMMONIA in the last 168 hours.  Cardiac Enzymes: Recent Labs  Lab 06/15/24 1152  CKTOTAL 109    BNP (last 3 results) Recent Labs    06/15/24 1152   BNP 242.6*    ProBNP (last 3 results) No results for input(s): PROBNP in the last 8760 hours.  CBG: No results for input(s): GLUCAP in the last 168 hours.  Lipase     Component Value Date/Time   LIPASE 27 03/27/2016 1214     Urinalysis    Component Value Date/Time   COLORURINE YELLOW 03/27/2016 1254   APPEARANCEUR CLEAR 03/27/2016 1254   LABSPEC 1.004 (L) 03/27/2016 1254   PHURINE 6.5 03/27/2016 1254   GLUCOSEU NEGATIVE 03/27/2016 1254   GLUCOSEU NEGATIVE 03/14/2011 1027   HGBUR NEGATIVE 03/27/2016 1254   HGBUR negative 05/02/2010 0904   BILIRUBINUR NEGATIVE 03/27/2016 1254   BILIRUBINUR neg 02/09/2013 1652   KETONESUR NEGATIVE 03/27/2016 1254   PROTEINUR NEGATIVE 03/27/2016 1254   UROBILINOGEN 1.0 02/27/2014 0129   NITRITE NEGATIVE 03/27/2016 1254   LEUKOCYTESUR NEGATIVE 03/27/2016 1254     Drugs of Abuse  No results found for: LABOPIA, COCAINSCRNUR, LABBENZ, AMPHETMU, THCU, LABBARB    Radiological Exams on Admission: MR BRAIN WO CONTRAST Result Date: 06/15/2024 EXAM: MRI BRAIN WITHOUT CONTRAST 06/15/2024 04:29:47 PM TECHNIQUE: Multiplanar multisequence MRI of the head/brain was performed without the administration of intravenous contrast. COMPARISON: Head CT 06/15/2024 and MRI 10/30/2005. CLINICAL HISTORY: Transient ischemic attack (TIA); acute vertigo, hx of Afib, not anticoagulated. FINDINGS: BRAIN AND VENTRICLES: There is a large acute to early subacute right cerebellar infarct in the PICA territory with cytotoxic edema partially effacing the fourth ventricle without evidence of obstructive hydrocephalus. Two adjacent subcentimeter acute to early subacute infarcts are also present in the left temporo-occipital region. There is a large subacute right PCA infarct involving the posterior temporal and occipital lobes which is older than the aforementioned infarcts and has associated petechial hemorrhage. There is mild mass effect on the temporal horn of the  right lateral ventricle. There are also older, small subacute infarcts in  the left cerebellar hemisphere with associated petechial hemorrhage. Scattered small T2 hyperintensities elsewhere in the cerebral white matter bilaterally are nonspecific but compatible with mild chronic small vessel ischemic disease. There is mild cerebral atrophy. No mass, midline shift, or extra-axial fluid collection is identified. Major intracranial vascular flow voids are preserved. ORBITS: Bilateral cataract extraction. SINUSES AND MASTOIDS: Minimal mucosal thickening in the paranasal sinuses. Trace bilateral mastoid fluid. BONES AND SOFT TISSUES: Normal marrow signal. No acute soft tissue abnormality. IMPRESSION: 1. Multiple posterior circulation infarcts of varying ages. 2. Large acute to early subacute right cerebellar infarct and small acute to early subacute infarcts in the left temporo-occipital region. 3. Large, older subacute right PCA infarct. 4. Small late subacute infarcts in the left cerebellum. Electronically signed by: Dasie Hamburg MD 06/15/2024 05:24 PM EST RP Workstation: [id number]   CT Head Wo Contrast Result Date: 06/15/2024 EXAM: CT HEAD WITHOUT CONTRAST 06/15/2024 01:06:00 PM TECHNIQUE: CT of the head was performed without the administration of intravenous contrast. Automated exposure control, iterative reconstruction, and/or weight based adjustment of the mA/kV was utilized to reduce the radiation dose to as low as reasonably achievable. COMPARISON: None available. CLINICAL HISTORY: Vertigo, central. FINDINGS: BRAIN AND VENTRICLES: Low density noted in the posterior medial right temporal lobe, right occipital lobe, and right cerebellum as well as potentially within the brainstem compatible with acute to subacute infarcts. Old infarcts in the left cerebellum. No acute hemorrhage. No hydrocephalus. No extra-axial collection. No mass effect or midline shift. ORBITS: No acute abnormality. SINUSES: No acute  abnormality. SOFT TISSUES AND SKULL: No acute soft tissue abnormality. No skull fracture. IMPRESSION: 1. Acute to subacute infarcts in the posterior medial right temporal lobe, right occipital lobe, right cerebellum, and potentially within the brainstem. Recommend MRI for further evaluation. Electronically signed by: Franky Crease MD 06/15/2024 01:52 PM EST RP Workstation: [id number]    EKG: Personally reviewed by me which shows rate controlled atrial fibrillation   Consultant: Neurology  Code Status: Full code  Microbiology none  Antibiotics: None  Family Communication:  Patients' condition and plan of care including tests being ordered have been discussed with the patient and the patient's son at bedside who indicate understanding and agree with the plan.   Status is: Inpatient Remains inpatient appropriate because: Acute stroke   Severity of Illness: The appropriate patient status for this patient is INPATIENT. Inpatient status is judged to be reasonable and necessary in order to provide the required intensity of service to ensure the patient's safety. The patient's presenting symptoms, physical exam findings, and initial radiographic and laboratory data in the context of their chronic comorbidities is felt to place them at high risk for further clinical deterioration. Furthermore, it is not anticipated that the patient will be medically stable for discharge from the hospital within 2 midnights of admission.   * I certify that at the point of admission it is my clinical judgment that the patient will require inpatient hospital care spanning beyond 2 midnights from the point of admission due to high intensity of service, high risk for further deterioration and high frequency of surveillance required.*  Signed, Vernal Alstrom, MD [facility] 06/15/2024

## 2024-06-15 NOTE — ED Notes (Signed)
 Got patient into a gown on the monitor did EKG shown to er provider patient is resting with call bell in reach

## 2024-06-15 NOTE — ED Notes (Signed)
 Pt in bed, pt has slight L facial droop, pupils are 3mm and reactive to light, pt oriented

## 2024-06-15 NOTE — ED Notes (Signed)
 Pt to Ct

## 2024-06-16 ENCOUNTER — Inpatient Hospital Stay (HOSPITAL_COMMUNITY)

## 2024-06-16 DIAGNOSIS — I639 Cerebral infarction, unspecified: Secondary | ICD-10-CM | POA: Diagnosis not present

## 2024-06-16 LAB — BASIC METABOLIC PANEL WITH GFR
Anion gap: 14 (ref 5–15)
BUN: 27 mg/dL — ABNORMAL HIGH (ref 8–23)
CO2: 24 mmol/L (ref 22–32)
Calcium: 9 mg/dL (ref 8.9–10.3)
Chloride: 102 mmol/L (ref 98–111)
Creatinine, Ser: 1.27 mg/dL — ABNORMAL HIGH (ref 0.61–1.24)
GFR, Estimated: 56 mL/min — ABNORMAL LOW (ref >60)
Glucose, Bld: 117 mg/dL — ABNORMAL HIGH (ref 70–99)
Potassium: 4.2 mmol/L (ref 3.5–5.1)
Sodium: 140 mmol/L (ref 135–145)

## 2024-06-16 LAB — ECHOCARDIOGRAM COMPLETE
AR max vel: 2.31 cm2
AV Peak grad: 4.6 mmHg
Ao pk vel: 1.07 m/s
Area-P 1/2: 4.41 cm2
Height: 69 in
S' Lateral: 2.8 cm
Weight: 3200 [oz_av]

## 2024-06-16 LAB — HEMOGLOBIN A1C
Hgb A1c MFr Bld: 5.8 % — ABNORMAL HIGH (ref 4.8–5.6)
Mean Plasma Glucose: 120 mg/dL

## 2024-06-16 LAB — CBC
HCT: 42.3 % (ref 39.0–52.0)
Hemoglobin: 14.3 g/dL (ref 13.0–17.0)
MCH: 28.9 pg (ref 26.0–34.0)
MCHC: 33.8 g/dL (ref 30.0–36.0)
MCV: 85.6 fL (ref 80.0–100.0)
Platelets: 153 K/uL (ref 150–400)
RBC: 4.94 MIL/uL (ref 4.22–5.81)
RDW: 13.1 % (ref 11.5–15.5)
WBC: 15.9 K/uL — ABNORMAL HIGH (ref 4.0–10.5)
nRBC: 0 % (ref 0.0–0.2)

## 2024-06-16 LAB — LIPID PANEL
Cholesterol: 117 mg/dL (ref 0–200)
HDL: 38 mg/dL — ABNORMAL LOW (ref >40)
LDL Cholesterol: 60 mg/dL (ref 0–99)
Total CHOL/HDL Ratio: 3.1 ratio
Triglycerides: 93 mg/dL (ref <150)
VLDL: 19 mg/dL (ref 0–40)

## 2024-06-16 LAB — MAGNESIUM: Magnesium: 1.8 mg/dL (ref 1.7–2.4)

## 2024-06-16 MED ORDER — ASPIRIN 81 MG PO TBEC
81.0000 mg | DELAYED_RELEASE_TABLET | Freq: Every day | ORAL | Status: DC
  Administered 2024-06-17 – 2024-06-21 (×5): 81 mg via ORAL
  Filled 2024-06-16 (×5): qty 1

## 2024-06-16 MED ORDER — CLOPIDOGREL BISULFATE 75 MG PO TABS
75.0000 mg | ORAL_TABLET | Freq: Every day | ORAL | Status: DC
  Administered 2024-06-17 – 2024-06-18 (×2): 75 mg via ORAL
  Filled 2024-06-16 (×2): qty 1

## 2024-06-16 NOTE — Progress Notes (Signed)
 Echocardiogram 2D Echocardiogram has been performed.  Dustin Ashley 06/16/2024, 3:57 PM

## 2024-06-16 NOTE — TOC Initial Note (Signed)
 Transition of Care [facility]) - Initial/Assessment Note    Patient Details  Name: Dustin Ashley MRN: [id number] Date of Birth: 13-Mar-1941  Transition of Care [facility] CM/SW Contact:    Nola Devere Hands, RN Phone Number: 06/16/2024, 11:38 AM  Clinical Narrative:                 Patient is a 83 year old male with past medical history of atrial fibrillation not on anticoagulation, history of hypertension, hyperlipidemia, history of coronary artery disease status post CABG and renal artery stenosis presented to hospital with sudden onset of dizziness around 11:30 PM yesterday night. Also had nausea and multiple episodes of vomiting with dizziness. Patient adm with [facility].  ICM will continue to follow for needs. Please consult us .       Patient Goals and CMS Choice            Expected Discharge Plan and Services                                              Prior Living Arrangements/Services                       Activities of Daily Living   ADL Screening (condition at time of admission) Independently performs ADLs?: No Does the patient have a NEW difficulty with bathing/dressing/toileting/self-feeding that is expected to last >3 days?: Yes (Initiates electronic notice to provider for possible OT consult) Does the patient have a NEW difficulty with getting in/out of bed, walking, or climbing stairs that is expected to last >3 days?: Yes (Initiates electronic notice to provider for possible PT consult) Does the patient have a NEW difficulty with communication that is expected to last >3 days?: No Is the patient deaf or have difficulty hearing?: Yes Does the patient have difficulty seeing, even when wearing glasses/contacts?: No Does the patient have difficulty concentrating, remembering, or making decisions?: No  Permission Sought/Granted                  Emotional Assessment              Admission diagnosis:  Cerebrovascular accident (CVA), unspecified  mechanism (HCC) [I63.9] Acute stroke due to ischemia [facility]) [I63.9] Patient Active Problem List   Diagnosis Date Noted   Acute stroke due to ischemia (HCC) 06/15/2024   Cervical radiculopathy 04/17/2021   SCC (squamous cell carcinoma) 03/15/2020   PCP NOTES >>>>>>>>>>>>>>>>>> 02/26/2016   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 - UDS) 12/13/2014   Elevated TSH 04/12/2014   Paroxysmal A-fib (HCC) 03/15/2014   Pulmonary embolism- DVT 02-2014, provoked  02/26/2014   RAS (renal artery stenosis) 07/01/2012   Annual physical exam 03/14/2011   Chronic renal insufficiency, stage II (mild)    Pain, joint, multiple sites    CPK, ABNORMAL 11/27/2009   ERECTILE DYSFUNCTION 03/28/2008   FATIGUE 03/28/2008   BACK PAIN 12/01/2006   HYPERGLYCEMIA 12/01/2006   Hyperlipidemia 08/07/2006   Essential hypertension 08/07/2006   CORONARY ARTERY DISEASE 08/07/2006   ALLERGIC RHINITIS 08/07/2006   GERD (and candida esophagitis 02-2014) 08/07/2006   History of colonic polyps 02/17/2003   PCP:  Amon Aloysius BRAVO, MD Pharmacy:   [facility] [phone] - [address] [place] [address] [place] [address] Phone: [phone] Fax: [phone]     Social Drivers of Health (SDOH) Social History: SDOH Screenings   Food  Insecurity: No Food Insecurity (06/15/2024)  Housing: Low Risk  (06/15/2024)  Transportation Needs: No Transportation Needs (06/15/2024)  Utilities: Not At Risk (06/15/2024)  Alcohol Screen: Low Risk  (04/08/2022)  Depression (PHQ2-9): Low Risk  (04/12/2024)  Financial Resource Strain: Low Risk  (04/08/2022)  Physical Activity: Inactive (04/08/2022)  Social Connections: Moderately Isolated (06/15/2024)  Stress: No Stress Concern Present (04/08/2022)  Tobacco Use: Medium Risk (06/15/2024)   SDOH Interventions:     Readmission Risk Interventions     No data to display

## 2024-06-16 NOTE — Evaluation (Addendum)
 Physical Therapy Evaluation Patient Details Name: Dustin Ashley MRN: [id number] DOB: 07-27-1940 Today's Date: 06/16/2024  History of Present Illness  83 y.o. male [facility]15[facility] with sudden onset of dizziness s/p fall. 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 PMHx: atrial fibrillation not on anticoagulation, history of hypertension, hyperlipidemia, history of coronary artery disease status post CABG and renal artery stenosis   Clinical Impression  PTA pt was independent for mobility with no AD. Pt presents with dizziness, impaired balance, and impaired gait pattern. Educated pt on gaze stabilization when mobilizing with pt reporting slight dizziness after bed mobility and with gait. Pt required MinA for bed mobility and up to ModA to stand with RW. Pt has multiple lateral losses of balance when ambulating requiring ModAx2 for safety. He would stagger right/left and would occasionally need assist to stabilize the RW when he began to lose his balance. Pt will have 24/7 assist available upon d/c home. Pt is an excellent candidate for >3hrs post acute rehab. Pt has strong family support, high levels of motivation, and was active prior to admit. Acute PT to follow to address functional deficits.         If plan is discharge home, recommend the following: A lot of help with walking and/or transfers;A lot of help with bathing/dressing/bathroom;Assist for transportation;Help with stairs or ramp for entrance   Can travel by private vehicle    No    Equipment Recommendations Wheelchair (measurements PT);Wheelchair cushion (measurements PT)  Recommendations for Other Services  Rehab consult    Functional Status Assessment Patient has had a recent decline in their functional status and demonstrates the ability to make significant improvements in function in a reasonable and predictable amount of time.     Precautions /  Restrictions Precautions Precautions: Fall Recall of Precautions/Restrictions: Intact Restrictions Weight Bearing Restrictions Per Provider Order: No      Mobility  Bed Mobility Overal bed mobility: Needs Assistance Bed Mobility: Rolling, Sidelying to Sit Rolling: Supervision, Used rails Sidelying to sit: Min assist, Used rails    General bed mobility comments: cues for gaze stabilization, MinA for slight trunk raise and steadying assist. Cues to scoot forwards towards EOB    Transfers Overall transfer level: Needs assistance Equipment used: Rolling walker (2 wheels) Transfers: Bed to chair/wheelchair/BSC, Sit to/from Stand Sit to Stand: Min assist, Mod assist, +2 physical assistance, +2 safety/equipment   Step pivot transfers: Mod assist    General transfer comment: Initially needed MinAx2 from EOB for boost-up and steadying assist. ModA from lower toilet height for boost-up. Cues for hand placement and proximity to RW    Ambulation/Gait Ambulation/Gait assistance: Mod assist, +2 safety/equipment, Max assist Gait Distance (Feet): 15 Feet (x2) Assistive device: Rolling walker (2 wheels) Gait Pattern/deviations: Step-through pattern, Decreased stride length, Staggering left, Staggering right       General Gait Details: Multiple lateral losses of balance with pt staggering right/left. ModA to correct LOB with +2 for safety with IV pole. Assist also needed to stabilize RW as pt would lift it off the ground. Cues for RW proximity and to decrease gait speed  Modified Rankin (Stroke Patients Only) Modified Rankin (Stroke Patients Only) Pre-Morbid Rankin Score: No symptoms Modified Rankin: Moderately severe disability     Balance Overall balance assessment: Needs assistance, Mild deficits observed, not formally tested Sitting-balance support: No upper extremity supported, Feet supported Sitting balance-Leahy Scale: Fair     Standing balance  support: Bilateral upper  extremity supported, During functional activity, Reliant on assistive device for balance Standing balance-Leahy Scale: Poor Standing balance comment: ModA to correct lateral losses of balance        Pertinent Vitals/Pain Pain Assessment Pain Assessment: No/denies pain    Home Living Family/patient expects to be discharged to:: Private residence Living Arrangements: Alone Available Help at Discharge: Family;Friend(s);Available 24 hours/day Type of Home: House Home Access: Stairs to enter Entrance Stairs-Rails: Left Entrance Stairs-Number of Steps: 5-6 Alternate Level Stairs-Number of Steps: 14 Home Layout: Two level;Able to live on main level with bedroom/bathroom;1/2 bath on main level Home Equipment: Rolling Walker (2 wheels);Cane - single point;BSC/3in1 Additional Comments: Pt will either d/c to his home (above) or his son's    Prior Function Prior Level of Function : Independent/Modified Independent;Driving    Mobility Comments: Ind with no AD       Extremity/Trunk Assessment   Upper Extremity Assessment Upper Extremity Assessment: Defer to OT evaluation    Lower Extremity Assessment Lower Extremity Assessment: Overall WFL for tasks assessed    Cervical / Trunk Assessment Cervical / Trunk Assessment: Normal  Communication   Communication Communication: Impaired Factors Affecting Communication: Hearing impaired    Cognition Arousal: Alert Behavior During Therapy: WFL for tasks assessed/performed   PT - Cognitive impairments: Awareness, Safety/Judgement, Memory      Following commands: Impaired Following commands impaired: Follows multi-step commands inconsistently, Only follows one step commands consistently (likely due to being [facility])     Cueing Cueing Techniques: Verbal cues, Tactile cues     General Comments General comments (skin integrity, edema, etc.): Son present and supportive during eval     PT Assessment Patient needs continued PT services   PT Problem List Decreased activity tolerance;Decreased balance;Decreased mobility;Decreased knowledge of use of DME;Decreased safety awareness       PT Treatment Interventions DME instruction;Gait training;Stair training;Functional mobility training;Therapeutic activities;Therapeutic exercise;Balance training;Neuromuscular re-education;Patient/family education    PT Goals (Current goals can be found in the Care Plan section)  Acute Rehab PT Goals Patient Stated Goal: to get better PT Goal Formulation: With patient/family Time For Goal Achievement: 06/30/24 Potential to Achieve Goals: Good    Frequency Min 3X/week     Co-evaluation   Reason for Co-Treatment: For patient/therapist safety;To address functional/ADL transfers PT goals addressed during session: Mobility/safety with mobility;Proper use of DME;Balance         AM-PAC PT 6 Clicks Mobility  Outcome Measure Help needed turning from your back to your side while in a flat bed without using bedrails?: A Little Help needed moving from lying on your back to sitting on the side of a flat bed without using bedrails?: A Little Help needed moving to and from a bed to a chair (including a wheelchair)?: A Lot Help needed standing up from a chair using your arms (e.g., wheelchair or bedside chair)?: A Lot Help needed to walk in hospital room?: A Lot Help needed climbing 3-5 steps with a railing? : A Lot 6 Click Score: 14    End of Session Equipment Utilized During Treatment: Gait belt Activity Tolerance: Patient tolerated treatment well Patient left: in chair;with call bell/phone within reach;with chair alarm set;with family/visitor present Nurse Communication: Mobility status PT Visit Diagnosis: Unsteadiness on feet (R26.81);Other abnormalities of gait and mobility (R26.89);Difficulty in walking, not elsewhere classified (R26.2)    Time: 8395-8363 PT Time Calculation (min) (ACUTE ONLY): 32 min   Charges:   PT  Evaluation $PT Eval Moderate Complexity: 1 Mod  PT General Charges $$ ACUTE PT VISIT: 1 Visit       Kate ORN, PT, DPT Secure Chat Preferred  Rehab Office [phone]   Kate BRAVO [name] 06/16/2024, 5:05 PM

## 2024-06-16 NOTE — Evaluation (Signed)
 Speech Language Pathology Evaluation Patient Details Name: Dustin Ashley MRN: [id number] DOB: September 20, 1940 Today's Date: 06/16/2024 Time: 1410-1436 SLP Time Calculation (min) (ACUTE ONLY): 26 min  Problem List:  Patient Active Problem List   Diagnosis Date Noted   Acute stroke due to ischemia (HCC) 06/15/2024   Cervical radiculopathy 04/17/2021   SCC (squamous cell carcinoma) 03/15/2020   PCP NOTES >>>>>>>>>>>>>>>>>> 02/26/2016   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 - UDS) 12/13/2014   Elevated TSH 04/12/2014   Paroxysmal A-fib (HCC) 03/15/2014   Pulmonary embolism- DVT 02-2014, provoked  02/26/2014   RAS (renal artery stenosis) 07/01/2012   Annual physical exam 03/14/2011   Chronic renal insufficiency, stage II (mild)    Pain, joint, multiple sites    CPK, ABNORMAL 11/27/2009   ERECTILE DYSFUNCTION 03/28/2008   FATIGUE 03/28/2008   BACK PAIN 12/01/2006   HYPERGLYCEMIA 12/01/2006   Hyperlipidemia 08/07/2006   Essential hypertension 08/07/2006   CORONARY ARTERY DISEASE 08/07/2006   ALLERGIC RHINITIS 08/07/2006   GERD (and candida esophagitis 02-2014) 08/07/2006   History of colonic polyps 02/17/2003   Past Medical History:  Past Medical History:  Diagnosis Date   Allergic rhinitis    Allergy    seasonal   Arthritis    Bell palsy    CAD (coronary artery disease)    Cataract    Chronic renal insufficiency, stage II (mild)    Cr ~ 1.4, u/s 4-12 normal kidneys   Gallstone pancreatitis 2015   GERD (gastroesophageal reflux disease)    HOH (hard of hearing)    has a hearing aid L, sees audiology rountinely   Hyperglycemia    A1C 5.8 04-2010   Hyperlipemia    Hypertension    Pain, joint, multiple sites    uses valium , occ uses for insomnia. uses for shoulder  pain   Paroxysmal atrial fibrillation (HCC)    Personal history of colonic polyps    Pulmonary emboli (HCC)    02-2014   RAS (renal artery stenosis) 05-2012    Renal insufficiency    SCC (squamous cell carcinoma)    Past  Surgical History:  Past Surgical History:  Procedure Laterality Date   CARDIAC CATHETERIZATION     CATARACT EXTRACTION     CHOLECYSTECTOMY N/A 02/15/2014   Procedure: LAPAROSCOPIC CHOLECYSTECTOMY with IOC;  Surgeon: Camellia CHRISTELLA Blush, MD;  Location: [facility] OR;  Service: General;  Laterality: N/A;   COLONOSCOPY     CORONARY ARTERY BYPASS GRAFT  1995   ESOPHAGOGASTRODUODENOSCOPY N/A 03/02/2014   Procedure: ESOPHAGOGASTRODUODENOSCOPY (EGD);  Surgeon: Gwendlyn ONEIDA Buddy, MD;  Location: [facility] ENDOSCOPY;  Service: Endoscopy;  Laterality: N/A;  sarah/leone   HPI:  83 y.o. male presents to [facility] 06/15/24 with sudden onset of dizziness s/p fall. 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 PMHx: atrial fibrillation not on anticoagulation, history of hypertension, hyperlipidemia, history of coronary artery disease status post CABG and renal artery stenosis   Assessment / Plan / Recommendation Clinical Impression  Patient presents with mild cognitive linguistic deficits in the areas of memory and awareness. SLP adminstered SLUMS assessment to evalulate overall cogintive-linguistic functioning where patient scored 20/30. 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 Patients strengths include orientation, vebral problem solving, visual discrimination, and immediate recall. Patient exhibited challenges in the areas of short term memory, working memory, and clock drawing test. An oral mechanism exam was performed to ensure facial and oral  structures are working properly for speech and swallowing. Facial and oral sturtcures appear symmetrical with adequate ROM and strength. No deficits noted in the areas of receptive/expressive language or motor speech. Patient would benefit from skilled speech therapy services targeting aformentioned deficits.    SLP Assessment  SLP  Recommendation/Assessment: Patient needs continued Speech Language Pathology Services SLP Visit Diagnosis: Cognitive communication deficit (R41.841)     Assistance Recommended at Discharge  Set up Supervision/Assistance  Functional Status Assessment Patient has had a recent decline in their functional status and demonstrates the ability to make significant improvements in function in a reasonable and predictable amount of time.  Frequency and Duration min 2x/week  2 weeks      SLP Evaluation Cognition  Overall Cognitive Status: Impaired/Different from baseline Arousal/Alertness: Awake/alert Orientation Level: Oriented X4 Year: 2025 Month: December Day of Week: Correct Attention: Focused;Sustained;Selective Focused Attention: Appears intact Sustained Attention: Appears intact Selective Attention: Appears intact Memory: Impaired Memory Impairment: Decreased short term memory;Other (comment) Decreased Short Term Memory: Verbal basic;Functional basic Awareness: Impaired Awareness Impairment: Intellectual impairment Problem Solving: Impaired Problem Solving Impairment: Verbal basic;Functional basic Executive Function: Self Monitoring;Self Correcting Self Monitoring: Impaired Self Monitoring Impairment: Verbal basic;Functional basic Self Correcting: Impaired Self Correcting Impairment: Verbal basic;Functional basic Safety/Judgment: Appears intact       Comprehension  Auditory Comprehension Overall Auditory Comprehension: Appears within functional limits for tasks assessed Yes/No Questions: Within Functional Limits Commands: Within Functional Limits Conversation: Complex Interfering Components: Hearing EffectiveTechniques: Increased volume;Pausing Visual Recognition/Discrimination Discrimination: Within Function Limits Reading Comprehension Reading Status: Not tested    Expression Expression Primary Mode of Expression: Verbal Verbal Expression Overall Verbal Expression:  Appears within functional limits for tasks assessed Initiation: No impairment Level of Generative/Spontaneous Verbalization: Conversation Repetition: No impairment Naming: No impairment Pragmatics: No impairment Written Expression Dominant Hand: Right   Oral / Motor  Oral Motor/Sensory Function Overall Oral Motor/Sensory Function: Within functional limits Motor Speech Intelligibility: Intelligible            Dustin Ashley 06/16/2024, 4:26 PM

## 2024-06-16 NOTE — Progress Notes (Signed)
 PROGRESS NOTE    BUTLER VEGH  FMW:[id number] DOB: 1941-01-25 DOA: 06/15/2024 PCP: Amon Aloysius BRAVO, MD   Brief Narrative:  HPI:  Patient is a 83 year old male with past medical history of atrial fibrillation not on anticoagulation, history of hypertension, hyperlipidemia, history of coronary artery disease status post CABG and renal artery stenosis presented to hospital with sudden onset of dizziness around 11:30 PM yesterday night.  It was also accompanied by nausea and multiple episodes of vomiting with vertigo.  Vertigo worse with movement of the head and position.  Patient denied any fever, chills or rigor.  Denied any headache, shortness of breath, chest pain or dyspnea.  Denied any focal weakness.  He then had a fall.  Patient denied any urinary urgency, frequency or dysuria.  Patient was then brought into the hospital.  In the ED, blood pressure was slightly elevated.  Labs were notable for creatinine elevation at 1.3.  Lactate of 1.9.  WBC elevated at 17.8.  CK level was 109.  BNP of 242.  CT head scan showed acute infarct in the posterior middle right temporal lobe occipital lobe and right cerebellum.  Code stroke was called in and neurology saw the patient.  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 MRI of the brain showed multiple posterior circulation infarct of varying ages with a large acute to early subacute right cerebellar infarct and acute to early subacute infarct in the left temporal parietal region and large subacute right PCA infarct.  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  Echo pending.  Continue to allow permissive hypertension for another 24 hours.  LDL only 60 but HDL 38, patient has been started on atorvastatin .  Remains on aspirin  and Plavix .  Neurology managing.  PT OT to see.  Atrial fibrillation not on anticoagulation.  Will continue telemetry monitoring.  Currently on aspirin .  Does not take anticoagulation but is on decision despite of understanding that he was at risk of a stroke for not taking blood thinners.  Follow neurology recommendations about anticoagulation.   History of insomnia.  On diazepam .  Will continue   History of CAD status post CABG, renal artery stenosis.  Continue aspirin  and statins   DVT prophylaxis: enoxaparin  (LOVENOX ) injection 40 mg Start: 06/15/24 1800   Code Status: Full Code  Family Communication: Son, grandson and granddaughter present at bedside.  Plan of care discussed with patient in length and he/she verbalized understanding and agreed with it.  Status is: Inpatient Remains inpatient appropriate because: Awaiting for complete workup of the stroke, PT OT assessment, echo and neurology assessment.   Estimated body mass index is 29.53 kg/m as calculated from the following:   Height as of this encounter: 5' 9 (1.753 m).   Weight as of this encounter: 90.7 kg.    Nutritional Assessment: Body mass index is 29.53 kg/m.[name] Seen by dietician.  I agree with the assessment and plan as outlined below: Nutrition Status:        . Skin Assessment: I have examined the patient's skin and I agree with the wound assessment as performed by the wound care RN as  outlined below:    Consultants:  Neurology  Procedures:  None  Antimicrobials:  Anti-infectives (From admission, onward)    None         Subjective: Patient seen and examined, still complains of dizziness even in the bed.  Has no other complaint, no other weakness in any part of the body.  Objective: Vitals:   06/15/24 2255 06/15/24 2300  06/16/24 0352 06/16/24 0807  BP: (!) 149/78 (!) 150/84 (!) 143/80 (!) 153/68  Pulse:  (!) 102  (!) 110  Resp: 13 16 15 18   Temp:  98 F (36.7 C) 98.7 F (37.1 C) 98.7 F (37.1 C)  TempSrc:  Axillary Axillary Oral  SpO2: 94% 94% 99% 94%  Weight:      Height:        Intake/Output Summary (Last 24 hours) at 06/16/2024 0836 Last data filed at 06/16/2024 0600 Gross per 24 hour  Intake 600 ml  Output 1000 ml  Net -400 ml   Filed Weights   06/15/24 1114  Weight: 90.7 kg    Examination:  General exam: Appears calm and comfortable  Respiratory system: Clear to auscultation. Respiratory effort normal. Cardiovascular system: S1 & S2 heard, RRR. No JVD, murmurs, rubs, gallops or clicks. No pedal edema. Gastrointestinal system: Abdomen is nondistended, soft and nontender. No organomegaly or masses felt. Normal bowel sounds heard. Central nervous system: Alert and oriented. No focal neurological deficits. Extremities: Symmetric 5 x 5 power. Skin: No rashes, lesions or ulcers Psychiatry: Judgement and insight appear normal. Mood & affect appropriate.    Data Reviewed: I have personally reviewed following labs and imaging studies  CBC: Recent Labs  Lab 06/15/24 1152 06/15/24 1202 06/15/24 1935 06/16/24 0137  WBC 17.8*  --  16.2* 15.9*  NEUTROABS 14.6*  --   --   --   HGB 14.8 15.3 14.6 14.3  HCT 44.0 45.0 42.5 42.3  MCV 86.4  --  84.8 85.6  PLT 134*  --  155 153   Basic Metabolic Panel: Recent Labs  Lab 06/15/24 1152 06/15/24 1202 06/15/24 1935 06/16/24 0137  NA 138 141  --  140  K 4.5 4.1  --  4.2  CL 104 104  --  102  CO2 21*  --   --  24  GLUCOSE 127* 129*  --  117*  BUN 37* 36*  --  27*  CREATININE 1.29* 1.30* 1.26* 1.27*  CALCIUM  9.2  --   --  9.0  MG  --   --   --  1.8   GFR: Estimated Creatinine Clearance: 49.1 mL/min (A) (by C-G formula based on SCr of 1.27 mg/dL (H)). Liver Function Tests: Recent Labs  Lab 06/15/24 1152  AST 25  ALT 19  ALKPHOS 84   BILITOT 1.0  PROT 6.6  ALBUMIN 3.6   No results for input(s): LIPASE, AMYLASE in the last 168 hours. No results for input(s): AMMONIA in the last 168 hours. Coagulation Profile: Recent Labs  Lab 06/15/24 1152  INR 1.0   Cardiac Enzymes: Recent Labs  Lab 06/15/24 1152  CKTOTAL 109   BNP (last 3 results) No results for input(s): PROBNP in the last 8760 hours. HbA1C: No results for input(s): HGBA1C in the last 72 hours. CBG: No results for input(s): GLUCAP in the last 168 hours. Lipid Profile: Recent Labs    06/16/24 0137  CHOL 117  HDL 38*  LDLCALC 60  TRIG 93  CHOLHDL 3.1   Thyroid  Function Tests: No results  for input(s): TSH, T4TOTAL, FREET4, T3FREE, THYROIDAB in the last 72 hours. Anemia Panel: No results for input(s): VITAMINB12, FOLATE, FERRITIN, TIBC, IRON, RETICCTPCT in the last 72 hours. Sepsis Labs: Recent Labs  Lab 06/15/24 1202  LATICACIDVEN 1.9    No results found for this or any previous visit (from the past 240 hours).   Radiology Studies: CT ANGIO HEAD NECK W WO CM Result Date: 06/15/2024 EXAM: CTA Head and Neck with Intravenous Contrast. CT Head without Contrast. CLINICAL HISTORY: Stroke/TIA, determine embolic source. TECHNIQUE: Axial CTA images of the head and neck performed with intravenous contrast. MIP reconstructed images were created and reviewed. Axial computed tomography images of the head/brain performed without intravenous contrast. Note: Per PQRS, the description of internal carotid artery percent stenosis, including 0 percent or normal exam, is based on [employer] Symptomatic Carotid Endarterectomy Trial (NASCET) criteria. Dose reduction technique was used including one or more of the following: automated exposure control, adjustment of mA and kV according to patient size, and/or iterative reconstruction. CONTRAST: With; 75 mL iohexol  (OMNIPAQUE ) 350 MG/ML injection. COMPARISON: Brain MRI 06/15/2024  redemonstration of multiple posterior circulation infarcts as seen earlier brain MRI. FINDINGS: CT HEAD: BRAIN: Redemonstration of multiple posterior circulation infarcts as seen earlier brain MRI. No acute intraparenchymal hemorrhage. No mass lesion. No CT evidence for acute territorial infarct. No midline shift or extra-axial collection. VENTRICLES: No hydrocephalus. ORBITS: The orbits are unremarkable. SINUSES AND MASTOIDS: The paranasal sinuses and mastoid air cells are clear. CTA NECK: COMMON CAROTID ARTERIES: Bilateral carotid bifurcation atherosclerotic calcification without hemodynamically significant stenosis by NASCET criteria. No dissection or occlusion. INTERNAL CAROTID ARTERIES: The internal carotid arteries are widely patent to the skull base. No stenosis by NASCET criteria. No dissection or occlusion. 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 The distal V2 and V3 segments of the right vertebral artery are patent. There is mild atherosclerotic calcification along the V2, V3, and V4 segments of the left vertebral artery. Mild atherosclerosis of the right vertebral artery V4 segment. No dissection. CTA HEAD: ANTERIOR CEREBRAL ARTERIES: No significant stenosis. No occlusion. No aneurysm. MIDDLE CEREBRAL ARTERIES: No significant stenosis. No occlusion. No aneurysm. POSTERIOR CEREBRAL ARTERIES: No significant stenosis. No occlusion. No aneurysm. BASILAR ARTERY: No significant stenosis. No occlusion. No aneurysm. OTHER: There is mild atherosclerotic calcification of the carotid siphons. There is no proximal PICA occlusion. AORTIC ARCH: Atherosclerosis of the aortic arch. SOFT TISSUES: No acute finding. No masses or lymphadenopathy. BONES: No acute osseous abnormality. IMPRESSION: 1. No emergent intracranial large vessel occlusion 2. Redemonstration of multiple posterior circulation infarcts. 3. 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 4. Atherosclerosis of the aortic arch and bilateral carotid bifurcation without hemodynamically significant stenosis by NASCET criteria. Mild atherosclerotic calcification of the carotid siphons. Electronically signed by: Franky Stanford MD 06/15/2024 07:38 PM EST RP Workstation: [id number]   MR BRAIN WO CONTRAST Result Date: 06/15/2024 EXAM: MRI BRAIN WITHOUT CONTRAST 06/15/2024 04:29:47 PM TECHNIQUE: Multiplanar multisequence MRI of the head/brain was performed without the administration of intravenous contrast. COMPARISON: Head CT 06/15/2024 and MRI 10/30/2005. CLINICAL HISTORY: Transient ischemic attack (TIA); acute vertigo, hx of Afib, not anticoagulated. FINDINGS: BRAIN AND VENTRICLES: There is a large acute to early subacute right cerebellar infarct in the PICA territory with cytotoxic edema partially effacing the fourth ventricle without evidence of obstructive hydrocephalus. Two adjacent subcentimeter acute to early subacute infarcts are also present in the left temporo-occipital  region. There is a large subacute right PCA infarct involving the posterior temporal and occipital lobes which is older than the aforementioned infarcts and has associated petechial hemorrhage. There is mild mass effect on the temporal horn of the right lateral ventricle. There are also older, small subacute infarcts in the left cerebellar hemisphere with associated petechial hemorrhage. Scattered small T2 hyperintensities elsewhere in the cerebral white matter bilaterally are nonspecific but compatible with mild chronic small vessel ischemic disease. There is mild cerebral atrophy. No mass, midline shift, or extra-axial fluid collection is identified. Major intracranial vascular flow voids are preserved. ORBITS: Bilateral cataract extraction. SINUSES AND MASTOIDS: Minimal mucosal thickening in the paranasal sinuses. Trace bilateral mastoid fluid. BONES AND SOFT TISSUES: Normal marrow signal.  No acute soft tissue abnormality. IMPRESSION: 1. Multiple posterior circulation infarcts of varying ages. 2. Large acute to early subacute right cerebellar infarct and small acute to early subacute infarcts in the left temporo-occipital region. 3. Large, older subacute right PCA infarct. 4. Small late subacute infarcts in the left cerebellum. Electronically signed by: Dasie Hamburg MD 06/15/2024 05:24 PM EST RP Workstation: [id number]   CT Head Wo Contrast Result Date: 06/15/2024 EXAM: CT HEAD WITHOUT CONTRAST 06/15/2024 01:06:00 PM TECHNIQUE: CT of the head was performed without the administration of intravenous contrast. Automated exposure control, iterative reconstruction, and/or weight based adjustment of the mA/kV was utilized to reduce the radiation dose to as low as reasonably achievable. COMPARISON: None available. CLINICAL HISTORY: Vertigo, central. FINDINGS: BRAIN AND VENTRICLES: Low density noted in the posterior medial right temporal lobe, right occipital lobe, and right cerebellum as well as potentially within the brainstem compatible with acute to subacute infarcts. Old infarcts in the left cerebellum. No acute hemorrhage. No hydrocephalus. No extra-axial collection. No mass effect or midline shift. ORBITS: No acute abnormality. SINUSES: No acute abnormality. SOFT TISSUES AND SKULL: No acute soft tissue abnormality. No skull fracture. IMPRESSION: 1. Acute to subacute infarcts in the posterior medial right temporal lobe, right occipital lobe, right cerebellum, and potentially within the brainstem. Recommend MRI for further evaluation. Electronically signed by: Franky Crease MD 06/15/2024 01:52 PM EST RP Workstation: [id number]    Scheduled Meds:   stroke: early stages of recovery book   Does not apply Once    stroke: early stages of recovery book   Does not apply Once   aspirin   81 mg Oral Daily   atorvastatin   20 mg Oral Daily   enoxaparin  (LOVENOX ) injection  40 mg Subcutaneous Q24H    Continuous Infusions:  sodium chloride  40 mL/hr at 06/15/24 2000     LOS: 1 day   Fredia Skeeter, MD [facility]  06/16/2024, 8:36 AM   *Please note that this is a verbal dictation therefore any spelling or grammatical errors are due to the Dragon Medical One system interpretation.  Please page via Amion and do not message via secure chat for urgent patient care matters. Secure chat can be used for non urgent patient care matters.  How to contact the [facility] Attending or Consulting provider 7A - 7P or covering provider during after hours 7P -7A, for this patient?  Check the care team in [facility] and look for a) attending/consulting [facility] provider listed and b) the [facility] team listed. Page or secure chat 7A-7P. Log into [email or web address] and use [facility] universal password to access. If you do not have the password, please contact the hospital operator. Locate the [facility] provider you are looking for under [facility] and page to a  number that you can be directly reached. If you still have difficulty reaching the provider, please page the [facility] (Director on Call) for the Hospitalists listed on amion for assistance.

## 2024-06-16 NOTE — Evaluation (Signed)
 Occupational Therapy Evaluation Patient Details Name: Dustin Ashley MRN: [id number] DOB: [birth year] Today's Date: 06/16/2024   History of Present Illness   83 y.o. male presents to [facility] 06/15/24 with sudden onset of dizziness s/p fall. 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 PMHx: atrial fibrillation not on anticoagulation, history of hypertension, hyperlipidemia, history of coronary artery disease status post CABG and renal artery stenosis     Clinical Impressions Pt seen for OT eval this PM, supportive son at bedside. PTA, pt was living alone and fully independent with ADLs, IADLs, driving, and ambulation without AD. Has great support upon d/c. Functionally, he presents with intact strength x4 extremities, and with primarily balance/coordination deficits, consistent with cerebellar CVA. Visual screen WNL; pt denies visual changes despite occipital infarct. Functionally, he required up to max A for LB ADLs and min A for standing UB ADLs. Mod A for functional ambulation with RW to correct several R lateral leans/LOB. Left upright in chair, RN staff updated on pt status.   Pt is currently functioning below baseline and would benefit from ongoing acute OT services to progress towards safe discharge and to facilitate return to prior level of function. Current recommendation is high-intensity post-acute rehab (> 3 hours/day).     If plan is discharge home, recommend the following:   A lot of help with walking and/or transfers;A lot of help with bathing/dressing/bathroom;Assistance with cooking/housework;Direct supervision/assist for medications management;Direct supervision/assist for financial management;Assist for transportation;Help with stairs or ramp for entrance     Functional Status Assessment   Patient has had a recent decline in their functional status and demonstrates the ability to make significant improvements  in function in a reasonable and predictable amount of time.     Equipment Recommendations   Other (comment) (defer to next level of care)     Recommendations for Other Services   Rehab consult     Precautions/Restrictions   Precautions Precautions: Fall Recall of Precautions/Restrictions: Intact Precaution/Restrictions Comments: HoH Restrictions Weight Bearing Restrictions Per Provider Order: No     Mobility Bed Mobility Overal bed mobility: Needs Assistance Bed Mobility: Rolling, Sidelying to Sit Rolling: Supervision, Used rails Sidelying to sit: Min assist, Used rails       General bed mobility comments: Exited to the R side, HOB elevated, assist for trunk elevation    Transfers Overall transfer level: Needs assistance Equipment used: Rolling walker (2 wheels) Transfers: Sit to/from Stand, Bed to chair/wheelchair/BSC Sit to Stand: Min assist, Mod assist, +2 physical assistance, +2 safety/equipment     Step pivot transfers: Mod assist     General transfer comment: Stood from bed with VC for hand placement, cued to approach surfaces safely with RW. Pt maintaining poor proximity to RW during ambulation.      Balance Overall balance assessment: Needs assistance, Mild deficits observed, not formally tested Sitting-balance support: No upper extremity supported, Feet supported Sitting balance-Leahy Scale: Fair Sitting balance - Comments: seated EOB, no overt LOB observed Postural control: Right lateral lean Standing balance support: Bilateral upper extremity supported, During functional activity, Reliant on assistive device for balance Standing balance-Leahy Scale: Poor Standing balance comment: mod A to correct R lateral lean/LOB (often lifting L two wheels of RW off the ground)                           ADL either performed or assessed with clinical judgement  ADL Overall ADL's : Needs assistance/impaired Eating/Feeding: Minimal  assistance;Sitting   Grooming: Minimal assistance;Standing;Wash/dry hands Grooming Details (indicate cue type and reason): sinkside, needs  A for proper judgement of power for soap dispenser and sink faucet (did not correct extremely high water pressure from faucet)         Upper Body Dressing : Minimal assistance   Lower Body Dressing: Maximal assistance;Sitting/lateral leans   Toilet Transfer: Minimal assistance;Ambulation;Regular Toilet;Rolling walker (2 wheels);Grab bars;Moderate assistance Toilet Transfer Details (indicate cue type and reason): VC to use GB's, cues for safe approach to toilet Toileting- Clothing Manipulation and Hygiene: Minimal assistance;Cueing for sequencing;Sitting/lateral lean Toileting - Clothing Manipulation Details (indicate cue type and reason): sequencing cues, assist to locate toilet paper dispenser     Functional mobility during ADLs: Moderate assistance;+2 for physical assistance;+2 for safety/equipment;Rolling walker (2 wheels)       Vision Baseline Vision/History: 1 Wears glasses Ability to See in Adequate Light: 1 Impaired (reports reduced acuity of near vision without glasses (not present in hospital at time of OT eval)) Patient Visual Report: No change from baseline Vision Assessment?: No apparent visual deficits Additional Comments: peripheral vision, visual fields, pursuits, and saccades all [facility] without corrective lenses     Perception         Praxis         Pertinent Vitals/Pain Pain Assessment Pain Assessment: No/denies pain     Extremity/Trunk Assessment Upper Extremity Assessment Upper Extremity Assessment: Overall WFL for tasks assessed   Lower Extremity Assessment Lower Extremity Assessment: Defer to PT evaluation   Cervical / Trunk Assessment Cervical / Trunk Assessment: Normal   Communication Communication Communication: Impaired Factors Affecting Communication: Hearing impaired (hearing aides not present in  hospital at time of OT eval)   Cognition Arousal: Alert Behavior During Therapy: WFL for tasks assessed/performed Cognition: No apparent impairments             OT - Cognition Comments: very pleasant & participatory                 Following commands: Impaired Following commands impaired: Follows multi-step commands inconsistently, Only follows one step commands consistently (likely impacted by hearing difficulty)     Cueing  General Comments   Cueing Techniques: Verbal cues;Tactile cues  supportive son present throughout   Exercises     Shoulder Instructions      Home Living Family/patient expects to be discharged to:: Private residence Living Arrangements: Alone Available Help at Discharge: Family;Friend(s);Available 24 hours/day Type of Home: House Home Access: Stairs to enter [employer] of Steps: 5-6 Entrance Stairs-Rails: Left Home Layout: Two level;Able to live on main level with bedroom/bathroom;1/2 bath on main level Alternate Level Stairs-Number of Steps: 14 Alternate Level Stairs-Rails: Right Bathroom Shower/Tub: Chief Strategy Officer: Standard     Home Equipment: Agricultural Consultant (2 wheels);Cane - single point;BSC/3in1   Additional Comments: Pt will either d/c to his home (above) or his son's  Lives With: Alone    Prior Functioning/Environment Prior Level of Function : Independent/Modified Independent;Driving             Mobility Comments: no AD PTA ADLs Comments: indep, driving, manages medications, cooks and does his own laundry    OT Problem List: Impaired balance (sitting and/or standing);Decreased coordination;Decreased safety awareness;Decreased knowledge of use of DME or AE   OT Treatment/Interventions: Self-care/ADL training;DME and/or AE instruction;Therapeutic activities;Cognitive remediation/compensation;Patient/family education;Balance training      OT Goals(Current goals can be found in the  care  plan section)   Acute Rehab OT Goals Patient Stated Goal: get better, pt and family agreeable for rehab OT Goal Formulation: With patient/family Time For Goal Achievement: 06/30/24 Potential to Achieve Goals: Good   OT Frequency:  Min 3X/week    Co-evaluation PT/OT/SLP Co-Evaluation/Treatment: Yes Reason for Co-Treatment: For patient/therapist safety;To address functional/ADL transfers PT goals addressed during session: Mobility/safety with mobility;Proper use of DME;Balance OT goals addressed during session: ADL's and self-care      AM-PAC OT 6 Clicks Daily Activity     Outcome Measure Help from another person eating meals?: A Little Help from another person taking care of personal grooming?: A Little Help from another person toileting, which includes using toliet, bedpan, or urinal?: A Lot Help from another person bathing (including washing, rinsing, drying)?: A Lot Help from another person to put on and taking off regular upper body clothing?: A Little Help from another person to put on and taking off regular lower body clothing?: A Lot 6 Click Score: 15   End of Session Equipment Utilized During Treatment: Gait belt;Rolling walker (2 wheels) Nurse Communication: Mobility status;Other (comment) (pt status at end of visit - communicated to RN and NT)  Activity Tolerance: Patient tolerated treatment well Patient left: in chair;with call bell/phone within reach;with chair alarm set;with family/visitor present  OT Visit Diagnosis: Unsteadiness on feet (R26.81);Other abnormalities of gait and mobility (R26.89);Dizziness and giddiness (R42)                Time: 8395-8367 OT Time Calculation (min): 28 min Charges:  OT General Charges $OT Visit: 1 Visit OT Evaluation $OT Eval Moderate Complexity: 1 Mod  Cassidy Tabet M. [place], OTR/L [facility] Acute Rehabilitation Services [phone] Secure Chat Preferred  Tavian Callander 06/16/2024, 5:19 PM

## 2024-06-16 NOTE — Plan of Care (Signed)
   Problem: Education: Goal: Knowledge of disease or condition will improve Outcome: Progressing Goal: Knowledge of secondary prevention will improve (MUST DOCUMENT ALL) Outcome: Progressing Goal: Knowledge of patient specific risk factors will improve (DELETE if not current risk factor) Outcome: Progressing

## 2024-06-16 NOTE — Progress Notes (Addendum)
 STROKE TEAM PROGRESS NOTE    SIGNIFICANT HOSPITAL EVENTS 06/15/2024 - Admitted  INTERIM HISTORY/SUBJECTIVE 12/3 Pt was with family this morning. Lying in bed, patient has noticed no concerns. Pt has not gotten up to walk to test his balance, and has been instructed to wait until therapy comes by to complete this assessment. Family is concerned about him losing his ability to drive, and we emphasized the importance of not driving if there is peripheral vision impairment. Therapy yet to assess. Family reports that patient has not been taking his Eliquis , but that he has been taking aspirin . MRI scan of the brain shows hemorrhagic right PCA and nonhemorrhagic right PICA infarct CT angiogram shows high-grade right vertebral artery stenosis/occlusion in the proximal segment with distal recanalization  OBJECTIVE  CBC    Component Value Date/Time   WBC 15.9 (H) 06/16/2024 0137   RBC 4.94 06/16/2024 0137   HGB 14.3 06/16/2024 0137   HCT 42.3 06/16/2024 0137   PLT 153 06/16/2024 0137   MCV 85.6 06/16/2024 0137   MCH 28.9 06/16/2024 0137   MCHC 33.8 06/16/2024 0137   RDW 13.1 06/16/2024 0137   LYMPHSABS 1.7 06/15/2024 1152   MONOABS 1.3 (H) 06/15/2024 1152   EOSABS 0.0 06/15/2024 1152   BASOSABS 0.0 06/15/2024 1152    BMET    Component Value Date/Time   NA 140 06/16/2024 0137   K 4.2 06/16/2024 0137   CL 102 06/16/2024 0137   CO2 24 06/16/2024 0137   GLUCOSE 117 (H) 06/16/2024 0137   GLUCOSE 125 (H) 07/17/2006 1002   BUN 27 (H) 06/16/2024 0137   CREATININE 1.27 (H) 06/16/2024 0137   CALCIUM  9.0 06/16/2024 0137   GFRNONAA 56 (L) 06/16/2024 0137    IMAGING past 24 hours CT ANGIO HEAD NECK W WO CM Result Date: 06/15/2024 EXAM: CTA Head and Neck with Intravenous Contrast. CT Head without Contrast. CLINICAL HISTORY: Stroke/TIA, determine embolic source. TECHNIQUE: Axial CTA images of the head and neck performed with intravenous contrast. MIP reconstructed images were created and  reviewed. Axial computed tomography images of the head/brain performed without intravenous contrast. Note: Per PQRS, the description of internal carotid artery percent stenosis, including 0 percent or normal exam, is based on North American Symptomatic Carotid Endarterectomy Trial (NASCET) criteria. Dose reduction technique was used including one or more of the following: automated exposure control, adjustment of mA and kV according to patient size, and/or iterative reconstruction. CONTRAST: With; 75 mL iohexol  (OMNIPAQUE ) 350 MG/ML injection. COMPARISON: Brain MRI 06/15/2024 redemonstration of multiple posterior circulation infarcts as seen earlier brain MRI. FINDINGS: CT HEAD: BRAIN: Redemonstration of multiple posterior circulation infarcts as seen earlier brain MRI. No acute intraparenchymal hemorrhage. No mass lesion. No CT evidence for acute territorial infarct. No midline shift or extra-axial collection. VENTRICLES: No hydrocephalus. ORBITS: The orbits are unremarkable. SINUSES AND MASTOIDS: The paranasal sinuses and mastoid air cells are clear. CTA NECK: COMMON CAROTID ARTERIES: Bilateral carotid bifurcation atherosclerotic calcification without hemodynamically significant stenosis by NASCET criteria. No dissection or occlusion. INTERNAL CAROTID ARTERIES: The internal carotid arteries are widely patent to the skull base. No stenosis by NASCET criteria. No dissection or occlusion. 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 The distal V2 and V3 segments of the right vertebral artery are patent. There is mild atherosclerotic calcification along the V2, V3, and V4 segments of the left vertebral artery. Mild atherosclerosis of the right vertebral artery V4 segment. No dissection. CTA HEAD: ANTERIOR CEREBRAL ARTERIES: No  significant stenosis. No occlusion. No aneurysm. MIDDLE CEREBRAL ARTERIES: No significant stenosis. No occlusion. No aneurysm. POSTERIOR CEREBRAL  ARTERIES: No significant stenosis. No occlusion. No aneurysm. BASILAR ARTERY: No significant stenosis. No occlusion. No aneurysm. OTHER: There is mild atherosclerotic calcification of the carotid siphons. There is no proximal PICA occlusion. AORTIC ARCH: Atherosclerosis of the aortic arch. SOFT TISSUES: No acute finding. No masses or lymphadenopathy. BONES: No acute osseous abnormality. IMPRESSION: 1. No emergent intracranial large vessel occlusion 2. Redemonstration of multiple posterior circulation infarcts. 3. 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 4. Atherosclerosis of the aortic arch and bilateral carotid bifurcation without hemodynamically significant stenosis by NASCET criteria. Mild atherosclerotic calcification of the carotid siphons. Electronically signed by: Franky Stanford MD 06/15/2024 07:38 PM EST RP Workstation: [id number]   MR BRAIN WO CONTRAST Result Date: 06/15/2024 EXAM: MRI BRAIN WITHOUT CONTRAST 06/15/2024 04:29:47 PM TECHNIQUE: Multiplanar multisequence MRI of the head/brain was performed without the administration of intravenous contrast. COMPARISON: Head CT 06/15/2024 and MRI 10/30/2005. CLINICAL HISTORY: Transient ischemic attack (TIA); acute vertigo, hx of Afib, not anticoagulated. FINDINGS: BRAIN AND VENTRICLES: There is a large acute to early subacute right cerebellar infarct in the PICA territory with cytotoxic edema partially effacing the fourth ventricle without evidence of obstructive hydrocephalus. Two adjacent subcentimeter acute to early subacute infarcts are also present in the left temporo-occipital region. There is a large subacute right PCA infarct involving the posterior temporal and occipital lobes which is older than the aforementioned infarcts and has associated petechial hemorrhage. There is mild mass effect on the temporal horn of the right lateral ventricle. There are also older, small subacute infarcts  in the left cerebellar hemisphere with associated petechial hemorrhage. Scattered small T2 hyperintensities elsewhere in the cerebral white matter bilaterally are nonspecific but compatible with mild chronic small vessel ischemic disease. There is mild cerebral atrophy. No mass, midline shift, or extra-axial fluid collection is identified. Major intracranial vascular flow voids are preserved. ORBITS: Bilateral cataract extraction. SINUSES AND MASTOIDS: Minimal mucosal thickening in the paranasal sinuses. Trace bilateral mastoid fluid. BONES AND SOFT TISSUES: Normal marrow signal. No acute soft tissue abnormality. IMPRESSION: 1. Multiple posterior circulation infarcts of varying ages. 2. Large acute to early subacute right cerebellar infarct and small acute to early subacute infarcts in the left temporo-occipital region. 3. Large, older subacute right PCA infarct. 4. Small late subacute infarcts in the left cerebellum. Electronically signed by: Dasie Hamburg MD 06/15/2024 05:24 PM EST RP Workstation: [id number]   CT Head Wo Contrast Result Date: 06/15/2024 EXAM: CT HEAD WITHOUT CONTRAST 06/15/2024 01:06:00 PM TECHNIQUE: CT of the head was performed without the administration of intravenous contrast. Automated exposure control, iterative reconstruction, and/or weight based adjustment of the mA/kV was utilized to reduce the radiation dose to as low as reasonably achievable. COMPARISON: None available. CLINICAL HISTORY: Vertigo, central. FINDINGS: BRAIN AND VENTRICLES: Low density noted in the posterior medial right temporal lobe, right occipital lobe, and right cerebellum as well as potentially within the brainstem compatible with acute to subacute infarcts. Old infarcts in the left cerebellum. No acute hemorrhage. No hydrocephalus. No extra-axial collection. No mass effect or midline shift. ORBITS: No acute abnormality. SINUSES: No acute abnormality. SOFT TISSUES AND SKULL: No acute soft tissue abnormality. No skull  fracture. IMPRESSION: 1. Acute to subacute infarcts in the posterior medial right temporal lobe, right occipital lobe, right cerebellum, and potentially within the brainstem. Recommend MRI for further evaluation. Electronically signed by:  Franky Crease MD 06/15/2024 01:52 PM EST RP Workstation: [id number]    Vitals:   06/15/24 2255 06/15/24 2300 06/16/24 0352 06/16/24 0807  BP: (!) 149/78 (!) 150/84 (!) 143/80 (!) 153/68  Pulse:  (!) 102  (!) 110  Resp: 13 16 15 18   Temp:  98 F (36.7 C) 98.7 F (37.1 C) 98.7 F (37.1 C)  TempSrc:  Axillary Axillary Oral  SpO2: 94% 94% 99% 94%  Weight:      Height:         PHYSICAL EXAM General:  Alert, well-nourished, well-developed patient in no acute distress Psych:  Mood and affect appropriate for situation CV: Regular rate and rhythm on monitor Respiratory:  Regular, unlabored respirations on room air GI: Abdomen soft and nontender   NEURO:  Mental Status: AA&Ox3 Speech/Language: speech is without dysarthria or aphasia.  Naming, repetition, fluency, and comprehension intact. Pt is hard of hearing per family.  Cranial Nerves:  II: PERRL. Visual fields full, but may be compensating on the L visual field. Will continue to assess. III, IV, VI: EOMI. Eyelids elevate symmetrically.  V: Sensation is intact to light touch and symmetrical to face.  VII: Face is symmetrical resting and smiling VIII: hearing intact to loud voice. IX, X: Palate elevates symmetrically. Phonation is normal.  KP:Dynloizm shrug 5/5. XII: tongue is midline without fasciculations. Motor: 5/5 strength to all muscle groups tested.  Tone: is normal and bulk is normal Sensation- Intact to light touch bilaterally. Extinction absent to light touch to DSS.   Coordination: FTN intact bilaterally, HKS: no ataxia in BLE.No drift.  Gait- deferred      ASSESSMENT/PLAN  Mr. Dustin Ashley is a 83 y.o. male with history of atrial fibrillation (not compliant with Eliquis ,  complaint with ASA), HTN, HLD, CAD post CABG, and Renal Artery Stenosis.  Admitted for Ischemic stroke.   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  Repeat CT Head on 12/5 assessing for hemorrhage prior to restarting Eliquis  CTA head & neck: Occlusion of R vertebral artery.  MRI: Multiple posterior circulation infarcts of differing ages. Large acute to early subacute right cerebellar infarct and small acute to early subacute infarcts in the left temporo-occipital region. Large older subacute R PCA infarct. Small late subacute infarcts in the L cerebellum. 2D Echo pending LDL 60 HgbA1c 5.7 VTE prophylaxis - Lovenox  Anticoagulation: aspirin  81 mg daily prior to admission, now on clopidogrel 75 mg daily. Plan to repeat CT head in 2 days (12/5) for concern of hemorrhagic transformation, if negative, will restart Eliquis  for plan of Plavix + Eliquis  for 90 days, then just Eliquis  indefinitely.  Therapy recommendations:  Pending Disposition:  Pending Therapy recs  Hx of Stroke/TIA Not previously recognized, but imaging suggests multiple prior infarcts.  Atrial fibrillation Home Meds: Eliquis  (was not taking) ASA (was taking) Continue telemetry monitoring Begin anticoagulation with Plavix for now (see above)  Hypertension Home meds:  Norvasc  5mg , Benazepril  40mg , Atenolol  50mg  Stable Blood Pressure Goal: SBP less than 160   Hyperlipidemia Home meds:  Lipitor 20mg , resumed in hospital LDL 60, goal < 70 Pt at goal, no change recommended Continue statin at discharge  Diabetes  Home meds:  none HgbA1c 5.7, goal < 7.0  Substance Abuse  UDS pending  Dysphagia Patient  has post-stroke dysphagia, SLP consulted    Diet   DIET DYS 3 Room service appropriate? Yes; Fluid consistency: Thin  Advance diet as tolerated  Other Stroke Risk Factors ETOH use, alcohol level <15 Obesity, Body mass index is 29.53 kg/m., BMI >/= 30 associated with increased stroke risk, recommend weight loss, diet and exercise as appropriate  Coronary artery disease  Other Active Problems   Hospital day # 1  D. Penne Mori, DO, PGY1 Neurology Stroke Team  I have personally obtained history,examined this patient, reviewed notes, independently viewed imaging studies, participated in medical decision making and plan of care.ROS completed by me personally and pertinent positives fully documented  I have made any additions or clarifications directly to the above note. Agree with note above.  Patient with atrial fibrillation not compliant with Eliquis  presented with dizziness and nausea vomiting secondary to right cerebellar as well as medial temporal occipital infarcts.  MRI also shows older age left cerebellar and right occipital infarcts.  Stroke etiology likely proximal right vertebral artery stenosis/occlusion with distal embolization.  Patient also has atrial fibrillation and has not been compliant with Eliquis .  Recommend dual antiplatelet therapy aspirin  and Plavix for a few days and then switch to aspirin  and Eliquis  after hemorrhagic transformation and stable in a few days.  Mobilize out of bed.  Therapy consults.  Patient advised not to drive due to peripheral vision loss improved.  Long discussion with patient and his family at the bedside and answered questions.   I personally spent a total of 50 minutes in the care of the patient today including getting/reviewing separately obtained history, performing a medically appropriate exam/evaluation, counseling and educating, placing orders, referring and communicating with other health care professionals, documenting clinical information  in the EHR, independently interpreting results, and coordinating care.         Eather Popp, MD Medical Director [facility] Pager: [phone] 06/16/2024 2:17 PM   To contact Stroke Continuity provider, please refer to [email or web address]. After hours, contact General Neurology

## 2024-06-16 NOTE — Progress Notes (Signed)
 PT Cancellation Note  Patient Details Name: Dustin Ashley MRN: [id number] DOB: January 10, 1941   Cancelled Treatment:    Reason Eval/Treat Not Completed: Patient at procedure or test/unavailable 14:28- Pt working with SLP. 14:58- Pt with ECHO at bedside.  Acute PT to re-attempt as schedule allows.   Kate ORN, PT, DPT Secure Chat Preferred  Rehab Office [phone]   Kate BRAVO [name] 06/16/2024, 3:48 PM

## 2024-06-16 NOTE — Progress Notes (Signed)
  Inpatient Rehab Admissions Coordinator :  Per therapy recommendations, patient was screened for CIR candidacy by Ottie Glazier RN MSN.  At this time patient appears to be a potential candidate for CIR. I will place a rehab consult per protocol for full assessment. Please call me with any questions.  Ottie Glazier RN MSN Admissions Coordinator [phone]

## 2024-06-17 DIAGNOSIS — I639 Cerebral infarction, unspecified: Secondary | ICD-10-CM | POA: Diagnosis not present

## 2024-06-17 LAB — BASIC METABOLIC PANEL WITH GFR
Anion gap: 6 (ref 5–15)
BUN: 28 mg/dL — ABNORMAL HIGH (ref 8–23)
CO2: 27 mmol/L (ref 22–32)
Calcium: 8.5 mg/dL — ABNORMAL LOW (ref 8.9–10.3)
Chloride: 107 mmol/L (ref 98–111)
Creatinine, Ser: 1.5 mg/dL — ABNORMAL HIGH (ref 0.61–1.24)
GFR, Estimated: 46 mL/min — ABNORMAL LOW (ref >60)
Glucose, Bld: 110 mg/dL — ABNORMAL HIGH (ref 70–99)
Potassium: 3.8 mmol/L (ref 3.5–5.1)
Sodium: 140 mmol/L (ref 135–145)

## 2024-06-17 LAB — CBC WITH DIFFERENTIAL/PLATELET
Abs Immature Granulocytes: 0.04 K/uL (ref 0.00–0.07)
Basophils Absolute: 0.1 K/uL (ref 0.0–0.1)
Basophils Relative: 1 %
Eosinophils Absolute: 0.3 K/uL (ref 0.0–0.5)
Eosinophils Relative: 2 %
HCT: 39.8 % (ref 39.0–52.0)
Hemoglobin: 13.4 g/dL (ref 13.0–17.0)
Immature Granulocytes: 0 %
Lymphocytes Relative: 21 %
Lymphs Abs: 2.4 K/uL (ref 0.7–4.0)
MCH: 28.9 pg (ref 26.0–34.0)
MCHC: 33.7 g/dL (ref 30.0–36.0)
MCV: 85.8 fL (ref 80.0–100.0)
Monocytes Absolute: 1.1 K/uL — ABNORMAL HIGH (ref 0.1–1.0)
Monocytes Relative: 10 %
Neutro Abs: 7.6 K/uL (ref 1.7–7.7)
Neutrophils Relative %: 66 %
Platelets: 160 K/uL (ref 150–400)
RBC: 4.64 MIL/uL (ref 4.22–5.81)
RDW: 13.3 % (ref 11.5–15.5)
WBC: 11.4 K/uL — ABNORMAL HIGH (ref 4.0–10.5)
nRBC: 0 % (ref 0.0–0.2)

## 2024-06-17 MED ORDER — HYDRALAZINE HCL 20 MG/ML IJ SOLN: 10.0000 mg | Freq: Four times a day (QID) | INTRAMUSCULAR | Status: DC | PRN

## 2024-06-17 MED ORDER — ATENOLOL 25 MG PO TABS
75.0000 mg | ORAL_TABLET | Freq: Every day | ORAL | Status: DC
  Administered 2024-06-17 – 2024-06-21 (×5): 75 mg via ORAL
  Filled 2024-06-17 (×5): qty 3

## 2024-06-17 MED ORDER — AMLODIPINE BESYLATE 5 MG PO TABS
5.0000 mg | ORAL_TABLET | Freq: Every day | ORAL | Status: DC
  Administered 2024-06-17 – 2024-06-18 (×2): 5 mg via ORAL
  Filled 2024-06-17 (×2): qty 1

## 2024-06-17 NOTE — TOC CAGE-AID Note (Signed)
 Transition of Care [facility]) - CAGE-AID Screening   Patient Details  Name: Dustin Ashley MRN: [id number] Date of Birth: January 07, 1941  [facility] CM/SW Contact:    Almarie CHRISTELLA Goodie, LCSW Phone Number: 06/17/2024, 2:24 PM   Clinical Narrative:   No substance abuse noted.    CAGE-AID Screening:    Have You Ever Felt You Ought to Cut Down on Your Drinking or Drug Use?: No Have People Annoyed You By Critizing Your Drinking Or Drug Use?: No Have You Felt Bad Or Guilty About Your Drinking Or Drug Use?: No Have You Ever Had a Drink or Used Drugs First Thing In The Morning to Steady Your Nerves or to Get Rid of a Hangover?: No CAGE-AID Score: 0

## 2024-06-17 NOTE — Consult Note (Signed)
 Physical Medicine and Rehabilitation Consult Reason for Consult:CIR/acute rehab Referring Physician:  Dr Vernon   HPI: Dustin Ashley is a 83 y.o.  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   Pt reports nausea has improved and vertigo somewhat improved but still there when gets OOB- per therapy note, still has staggering gait and posterior bias.   Pt upgraded to D3 thin diet by SLP for his dysphagia on 12/2, He also reports L visual blurriness and partial visual loss.   Pt reports LBM was Monday night - he was on toilet when had the stroke actually per pt.Usually goes 2x/week at home and needs senna every so often.   Is voiding OK.    Social Hx: Lives along in 2 story home with 5-6 STE, but son lives in 1 story home with 3 STE.  Non smoker- was going to [facility] and driving 7-6k/tzzx up to 2 months ago when fell and slipped on black ice and hurt his back.    Review of Systems  Constitutional:  Positive for malaise/fatigue. Negative for weight loss.  HENT:  Positive for hearing loss.        Severely HOH- doesn't have hearing iads  Eyes:  Positive for blurred vision.       Visual loss on Left side- blurry  Respiratory: Negative.  Negative for cough and shortness of breath.   Cardiovascular:  Negative for chest pain and leg swelling.       In chronic afib  Gastrointestinal:  Positive for constipation. Negative for diarrhea, nausea and vomiting.  Genitourinary: Negative.   Musculoskeletal:  Positive for back pain. Negative for joint pain.  Skin: Negative.   Neurological:  Positive for weakness and headaches.       Been having HA's since stroke- didn't really have many  prior to stroke- mainly when wakes up  Endo/Heme/Allergies: Negative.   Psychiatric/Behavioral:  Negative for memory loss. The patient has insomnia.   All other systems reviewed and are negative.  Past Medical History:  Diagnosis Date   Allergic rhinitis    Allergy    seasonal   Arthritis    Bell palsy    CAD (coronary artery disease)    Cataract    Chronic renal insufficiency, stage II (mild)    Cr ~ 1.4, u/s 4-12 normal kidneys   Gallstone pancreatitis 2015   GERD (gastroesophageal reflux disease)    HOH (hard of hearing)    has a hearing aid L, sees audiology rountinely   Hyperglycemia    A1C 5.8 04-2010   Hyperlipemia    Hypertension    Pain, joint, multiple sites    uses valium , occ uses for insomnia. uses for shoulder  pain   Paroxysmal atrial fibrillation [facility])    Personal history of colonic polyps    Pulmonary emboli (HCC)    02-2014   RAS (renal artery stenosis) 05-2012    Renal insufficiency    SCC (squamous cell carcinoma)    Past Surgical History:  Procedure Laterality Date   CARDIAC CATHETERIZATION     CATARACT EXTRACTION     CHOLECYSTECTOMY N/A 02/15/2014   Procedure:  LAPAROSCOPIC CHOLECYSTECTOMY with IOC;  Surgeon: Camellia CHRISTELLA Blush, MD;  Location: [facility] OR;  Service: General;  Laterality: N/A;   COLONOSCOPY     CORONARY ARTERY BYPASS GRAFT  1995   ESOPHAGOGASTRODUODENOSCOPY N/A 03/02/2014   Procedure: ESOPHAGOGASTRODUODENOSCOPY (EGD);  Surgeon: Gwendlyn ONEIDA Buddy, MD;  Location: [facility] ENDOSCOPY;  Service: Endoscopy;  Laterality: N/A;  sarah/leone   Family History  Problem Relation Age of Onset   Hypertension Father    Heart attack Brother        2 brothers, CABG   Diabetes Neg Hx    Colon cancer Neg Hx    Prostate cancer Neg Hx        colon, prostate   Rectal cancer Neg Hx    Stomach cancer Neg Hx    Social History:  reports that he quit smoking about 48 years ago. His smoking use included cigarettes. He has never used smokeless tobacco. He reports that he does  not drink alcohol and does not use drugs. Allergies:  Allergies  Allergen Reactions   Hydrocodone -Acetaminophen  Other (See Comments)    REACTION: bladder obstruction   Tramadol Hcl Other (See Comments)    REACTION: bladder obstruction   Medications Prior to Admission  Medication Sig Dispense Refill   amLODipine  (NORVASC ) 5 MG tablet Take 1 tablet (5 mg total) by mouth daily. 180 tablet 3   apixaban  (ELIQUIS ) 2.5 MG TABS tablet Take 1 tablet (2.5 mg total) by mouth 2 (two) times daily. (Patient not taking: Reported on 05/11/2024) 60 tablet 0   atenolol  (TENORMIN ) 50 MG tablet Take 1.5 tablets (75 mg total) by mouth daily. 135 tablet 1   atorvastatin  (LIPITOR) 20 MG tablet TAKE 1 TABLET BY MOUTH EVERY DAY 90 tablet 1   benazepril  (LOTENSIN ) 40 MG tablet Take 0.5 tablets (20 mg total) by mouth 2 (two) times daily.     benzonatate  (TESSALON  PERLES) 100 MG capsule Take 1 capsule (100 mg total) by mouth 3 (three) times daily as needed for cough. 21 capsule 0   diazepam  (VALIUM ) 10 MG tablet Take 1 tablet (10 mg total) by mouth every 12 (twelve) hours as needed for anxiety or sleep. 60 tablet 3   fexofenadine (ALLEGRA) 180 MG tablet Take 180 mg by mouth daily.      Home: Home Living Family/patient expects to be discharged to:: Private residence Living Arrangements: Alone Available Help at Discharge: Family, Friend(s), Available 24 hours/day Type of Home: House Home Access: Stairs to enter [employer] of Steps: 5-6 Entrance Stairs-Rails: Left Home Layout: Two level, Able to live on main level with bedroom/bathroom, 1/2 bath on main level Alternate Level Stairs-Number of Steps: 14 Alternate Level Stairs-Rails: Right Bathroom Shower/Tub: Engineer, Manufacturing Systems: Standard Home Equipment: Agricultural [employer] (2 wheels), [employer] - single point, BSC/3in1 Additional Comments: Pt will either d/c to his home (above) or his son's  Lives With: Alone  Functional History: Prior  Function Prior Level of Function : Independent/Modified Independent, Driving Mobility Comments: no AD PTA ADLs Comments: indep, driving, manages medications, cooks and does his own Pharmacist, Hospital Status:  Mobility: Bed Mobility Overal bed mobility: Needs Assistance Bed Mobility: Supine to Sit Rolling: Supervision, Used rails Sidelying to sit: Supervision, Used rails General bed mobility comments: increased time and effort Transfers Overall transfer level: Needs assistance Equipment used: Rolling walker (2 wheels) Transfers: Sit to/from Stand Sit to Stand: Min assist, Contact guard assist Bed to/from chair/wheelchair/BSC transfer type:: Step pivot Step pivot transfers: Mod assist General transfer comment: STS with  min A progressing to CGA from recliner without UE support and cues for anterior weight shift Ambulation/Gait Ambulation/Gait assistance: Min assist, +2 safety/equipment Gait Distance (Feet): 100 Feet (x2) Assistive device: Rolling walker (2 wheels) Gait Pattern/deviations: Step-through pattern, Decreased stride length, Drifts right/left, Trunk flexed General Gait Details: Pt demonstrates unsteadiness and reliance on RW support and min A for balance. Pt demonstrates low foot clearance, but is able to slightly improve with cues. chair follow for safety, but pt only requiring a standing rest break    ADL: ADL Overall ADL's : Needs assistance/impaired Eating/Feeding: Minimal assistance, Sitting Grooming: Minimal assistance, Standing, Wash/dry hands Grooming Details (indicate cue type and reason): sinkside, needs  A for proper judgement of power for soap dispenser and sink faucet (did not correct extremely high water pressure from faucet) Upper Body Dressing : Minimal assistance Lower Body Dressing: Maximal assistance, Sitting/lateral leans Toilet Transfer: Minimal assistance, Ambulation, Regular Toilet, Rolling walker (2 wheels), Grab bars, Moderate assistance Toilet  Transfer Details (indicate cue type and reason): VC to use GB's, cues for safe approach to toilet Toileting- Clothing Manipulation and Hygiene: Minimal assistance, Cueing for sequencing, Sitting/lateral lean Toileting - Clothing Manipulation Details (indicate cue type and reason): sequencing cues, assist to locate toilet paper dispenser Functional mobility during ADLs: Moderate assistance, +2 for physical assistance, +2 for safety/equipment, Rolling walker (2 wheels)  Cognition: Cognition Overall Cognitive Status: Impaired/Different from baseline Arousal/Alertness: Awake/alert Orientation Level: Oriented X4 Year: 2025 Month: December Day of Week: Correct Attention: Focused, Sustained, Selective Focused Attention: Appears intact Sustained Attention: Appears intact Selective Attention: Appears intact Memory: Impaired Memory Impairment: Decreased short term memory, Other (comment) Decreased Short Term Memory: Verbal basic, Functional basic Awareness: Impaired Awareness Impairment: Intellectual impairment Problem Solving: Impaired Problem Solving Impairment: Verbal basic, Functional basic Executive Function: Self Monitoring, Self Correcting Self Monitoring: Impaired Self Monitoring Impairment: Verbal basic, Functional basic Self Correcting: Impaired Self Correcting Impairment: Verbal basic, Functional basic Safety/Judgment: Appears intact Cognition Arousal: Alert Behavior During Therapy: WFL for tasks assessed/performed Overall Cognitive Status: Impaired/Different from baseline  Blood pressure (!) 156/67, pulse 98, temperature 97.8 F (36.6 C), temperature source Oral, resp. rate 17, height 5' 9 (1.753 m), weight 90.7 kg, SpO2 94%. Physical Exam Vitals and nursing note reviewed. Exam conducted with a chaperone present.  Constitutional:      Appearance: Normal appearance. He is obese.     Comments: Sitting up in bed; son and cousin at bedside; awake, alert, appropriate, very HOH,  NAD  HENT:     Head: Normocephalic and atraumatic.     Comments: No facial droop Tongue midline Facial sensation intact    Right Ear: External ear normal.     Left Ear: External ear normal.     Nose: Nose normal. No congestion.     Mouth/Throat:     Mouth: Mucous membranes are moist.     Pharynx: Oropharynx is clear. No oropharyngeal exudate.  Eyes:     General:        Right eye: No discharge.        Left eye: No discharge.     Extraocular Movements: Extraocular movements intact.     Comments: L partial visual loss-   Cardiovascular:     Rate and Rhythm: Normal rate. Rhythm irregular.     Heart sounds: Normal heart sounds. No murmur heard.    No gallop.  Pulmonary:     Effort: Pulmonary effort is normal. No respiratory distress.     Breath sounds: Normal breath sounds.  No wheezing, rhonchi or rales.  Abdominal:     Palpations: Abdomen is soft.     Comments: Protuberant vs distended- NT; soft- hypoactive BS  Musculoskeletal:     Cervical back: Neck supple.     Comments: RUE 5/5- throughout LUE_ 5-/5 throughout esp FA LE's 5/5 except L HF 4+/5   Skin:    General: Skin is warm and dry.     Comments: L forearm IV- looks OK  Neurological:     Mental Status: He is alert.     Comments: Ox3- knew at [facility] for stroke [facility] 11/25, but then self corrected to 12/25 Didn't know day/date specifically Repetition intact and naming intact 3/3 Finger to nose decreased speed but L>R searching pattern- couldn't hit my finger 2/3 times on L and 1x on R searching so severely L partial visual deficit due to stroke  Psychiatric:        Mood and Affect: Mood normal.        Behavior: Behavior normal.     Results for orders placed or performed during the hospital encounter of 06/15/24 (from the past 24 hours)  CBC with Differential/Platelet     Status: Abnormal   Collection Time: 06/17/24  2:48 AM  Result Value Ref Range   WBC 11.4 (H) 4.0 - 10.5 K/uL   RBC 4.64 4.22 - 5.81  MIL/uL   Hemoglobin 13.4 13.0 - 17.0 g/dL   HCT 60.1 60.9 - 47.9 %   MCV 85.8 80.0 - 100.0 fL   MCH 28.9 26.0 - 34.0 pg   MCHC 33.7 30.0 - 36.0 g/dL   RDW 86.6 88.4 - 84.4 %   Platelets 160 150 - 400 K/uL   nRBC 0.0 0.0 - 0.2 %   Neutrophils Relative % 66 %   Neutro Abs 7.6 1.7 - 7.7 K/uL   Lymphocytes Relative 21 %   Lymphs Abs 2.4 0.7 - 4.0 K/uL   Monocytes Relative 10 %   Monocytes Absolute 1.1 (H) 0.1 - 1.0 K/uL   Eosinophils Relative 2 %   Eosinophils Absolute 0.3 0.0 - 0.5 K/uL   Basophils Relative 1 %   Basophils Absolute 0.1 0.0 - 0.1 K/uL   Immature Granulocytes 0 %   Abs Immature Granulocytes 0.04 0.00 - 0.07 K/uL  Basic metabolic panel     Status: Abnormal   Collection Time: 06/17/24  2:48 AM  Result Value Ref Range   Sodium 140 135 - 145 mmol/L   Potassium 3.8 3.5 - 5.1 mmol/L   Chloride 107 98 - 111 mmol/L   CO2 27 22 - 32 mmol/L   Glucose, Bld 110 (H) 70 - 99 mg/dL   BUN 28 (H) 8 - 23 mg/dL   Creatinine, Ser 8.49 (H) 0.61 - 1.24 mg/dL   Calcium  8.5 (L) 8.9 - 10.3 mg/dL   GFR, Estimated 46 (L) >60 mL/min   Anion gap 6 5 - 15   ECHOCARDIOGRAM COMPLETE Result Date: 06/16/2024    ECHOCARDIOGRAM REPORT   Patient Name:   REAGEN HABERMAN [facility] Date of Exam: 06/16/2024 Medical Rec #:  [id number]       Height:       69.0 in Accession #:    [id number]      Weight:       200.0 lb Date of Birth:  14-Feb-1941       BSA:          2.066 m Patient Age:    54 years  BP:           155/80 mmHg Patient Gender: M               HR:           106 bpm. Exam Location:  Inpatient Procedure: 2D Echo, Cardiac Doppler and Color Doppler (Both Spectral and Color            Flow Doppler were utilized during procedure). Indications:    Stroke  History:        Patient has prior history of Echocardiogram examinations, most                 recent 04/09/2024. Arrythmias:Atrial Fibrillation; Risk                 Factors:Dyslipidemia and Hypertension.  Sonographer:    Sherlean Dubin Referring Phys: [id number]  [facility] [place]REL IMPRESSIONS  1. Left ventricular ejection fraction, by estimation, is 60 to 65%. The left ventricle has normal function. Left ventricular endocardial border not optimally defined to evaluate regional wall motion. Left ventricular diastolic function could not be evaluated.  2. Right ventricular systolic function is normal. The right ventricular size is normal.  3. The mitral valve is normal in structure. No evidence of mitral valve regurgitation. No evidence of mitral stenosis.  4. The aortic valve is normal in structure. Aortic valve regurgitation is not visualized. No aortic stenosis is present. Comparison(s): No significant change from prior study. FINDINGS  Left Ventricle: Left ventricular ejection fraction, by estimation, is 60 to 65%. The left ventricle has normal function. Left ventricular endocardial border not optimally defined to evaluate regional wall motion. The left ventricular internal cavity size was normal in size. Suboptimal image quality limits for assessment of left ventricular hypertrophy. Left ventricular diastolic function could not be evaluated. Right Ventricle: The right ventricular size is normal. No increase in right ventricular wall thickness. Right ventricular systolic function is normal. Left Atrium: Left atrial size was not well visualized. Right Atrium: Right atrial size was not well visualized. Pericardium: The pericardium was not well visualized. Mitral Valve: The mitral valve is normal in structure. No evidence of mitral valve regurgitation. No evidence of mitral valve stenosis. Tricuspid Valve: The tricuspid valve is not well visualized. Tricuspid valve regurgitation is not demonstrated. No evidence of tricuspid stenosis. Aortic Valve: The aortic valve is normal in structure. Aortic valve regurgitation is not visualized. No aortic stenosis is present. Aortic valve peak gradient measures 4.6 mmHg. Pulmonic Valve: The pulmonic valve was not well visualized. Pulmonic  valve regurgitation is not visualized. Aorta: The aortic root is normal in size and structure. Venous: The inferior vena cava was not well visualized. IAS/Shunts: The interatrial septum was not well visualized.  LEFT VENTRICLE PLAX 2D LVIDd:         4.30 cm   Diastology LVIDs:         2.80 cm   LV e' medial:  8.81 cm/s LV PW:         0.90 cm   LV e' lateral: 14.80 cm/s LV IVS:        0.90 cm LVOT diam:     2.00 cm LV SV:         41 LV SV Index:   20 LVOT Area:     3.14 cm  RIGHT VENTRICLE RV Basal diam:  3.90 cm    PULMONARY VEINS RV Mid diam:    3.20 cm    Diastolic Velocity: 17.70 cm/s RV S  prime:     7.07 cm/s  S/D Velocity:       2.20 TAPSE (M-mode): 2.0 cm     Systolic Velocity:  39.00 cm/s LEFT ATRIUM             Index        RIGHT ATRIUM           Index LA diam:        4.30 cm 2.08 cm/m   RA Area:     22.80 cm LA Vol (A2C):   78.8 ml 38.14 ml/m  RA Volume:   58.40 ml  28.27 ml/m LA Vol (A4C):   53.9 ml 26.09 ml/m LA Biplane Vol: 65.3 ml 31.61 ml/m  AORTIC VALVE AV Area (Vmax): 2.31 cm AV Vmax:        107.00 cm/s AV Peak Grad:   4.6 mmHg LVOT Vmax:      78.80 cm/s LVOT Vmean:     55.200 cm/s LVOT VTI:       0.131 m  AORTA Ao Root diam: 3.20 cm Ao Asc diam:  2.90 cm MITRAL VALVE             TRICUSPID VALVE MV Area (PHT): 4.41 cm  TR Peak grad:   11.6 mmHg MV Decel Time: 172 msec  TR Vmax:        170.00 cm/s                           SHUNTS                          Systemic VTI:  0.13 m                          Systemic Diam: 2.00 cm Joelle Azobou Tonleu Electronically signed by Joelle Cedars Tonleu Signature Date/Time: 06/16/2024/4:30:45 PM    Final    CT ANGIO HEAD NECK W WO CM Result Date: 06/15/2024 EXAM: CTA Head and Neck with Intravenous Contrast. CT Head without Contrast. CLINICAL HISTORY: Stroke/TIA, determine embolic source. TECHNIQUE: Axial CTA images of the head and neck performed with intravenous contrast. MIP reconstructed images were created and reviewed. Axial computed tomography images  of the head/brain performed without intravenous contrast. Note: Per PQRS, the description of internal carotid artery percent stenosis, including 0 percent or normal exam, is based on North American Symptomatic Carotid Endarterectomy Trial (NASCET) criteria. Dose reduction technique was used including one or more of the following: automated exposure control, adjustment of mA and kV according to patient size, and/or iterative reconstruction. CONTRAST: With; 75 mL iohexol  (OMNIPAQUE ) 350 MG/ML injection. COMPARISON: Brain MRI 06/15/2024 redemonstration of multiple posterior circulation infarcts as seen earlier brain MRI. FINDINGS: CT HEAD: BRAIN: Redemonstration of multiple posterior circulation infarcts as seen earlier brain MRI. No acute intraparenchymal hemorrhage. No mass lesion. No CT evidence for acute territorial infarct. No midline shift or extra-axial collection. VENTRICLES: No hydrocephalus. ORBITS: The orbits are unremarkable. SINUSES AND MASTOIDS: The paranasal sinuses and mastoid air cells are clear. CTA NECK: COMMON CAROTID ARTERIES: Bilateral carotid bifurcation atherosclerotic calcification without hemodynamically significant stenosis by NASCET criteria. No dissection or occlusion. INTERNAL CAROTID ARTERIES: The internal carotid arteries are widely patent to the skull base. No stenosis by NASCET criteria. No dissection or occlusion. 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 The distal V2 and V3 segments of the  right vertebral artery are patent. There is mild atherosclerotic calcification along the V2, V3, and V4 segments of the left vertebral artery. Mild atherosclerosis of the right vertebral artery V4 segment. No dissection. CTA HEAD: ANTERIOR CEREBRAL ARTERIES: No significant stenosis. No occlusion. No aneurysm. MIDDLE CEREBRAL ARTERIES: No significant stenosis. No occlusion. No aneurysm. POSTERIOR CEREBRAL ARTERIES: No significant stenosis. No  occlusion. No aneurysm. BASILAR ARTERY: No significant stenosis. No occlusion. No aneurysm. OTHER: There is mild atherosclerotic calcification of the carotid siphons. There is no proximal PICA occlusion. AORTIC ARCH: Atherosclerosis of the aortic arch. SOFT TISSUES: No acute finding. No masses or lymphadenopathy. BONES: No acute osseous abnormality. IMPRESSION: 1. No emergent intracranial large vessel occlusion 2. Redemonstration of multiple posterior circulation infarcts. 3. 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 4. Atherosclerosis of the aortic arch and bilateral carotid bifurcation without hemodynamically significant stenosis by NASCET criteria. Mild atherosclerotic calcification of the carotid siphons. Electronically signed by: Franky Stanford MD 06/15/2024 07:38 PM EST RP Workstation: [id number]   MR BRAIN WO CONTRAST Result Date: 06/15/2024 EXAM: MRI BRAIN WITHOUT CONTRAST 06/15/2024 04:29:47 PM TECHNIQUE: Multiplanar multisequence MRI of the head/brain was performed without the administration of intravenous contrast. COMPARISON: Head CT 06/15/2024 and MRI 10/30/2005. CLINICAL HISTORY: Transient ischemic attack (TIA); acute vertigo, hx of Afib, not anticoagulated. FINDINGS: BRAIN AND VENTRICLES: There is a large acute to early subacute right cerebellar infarct in the PICA territory with cytotoxic edema partially effacing the fourth ventricle without evidence of obstructive hydrocephalus. Two adjacent subcentimeter acute to early subacute infarcts are also present in the left temporo-occipital region. There is a large subacute right PCA infarct involving the posterior temporal and occipital lobes which is older than the aforementioned infarcts and has associated petechial hemorrhage. There is mild mass effect on the temporal horn of the right lateral ventricle. There are also older, small subacute infarcts in the left cerebellar hemisphere  with associated petechial hemorrhage. Scattered small T2 hyperintensities elsewhere in the cerebral white matter bilaterally are nonspecific but compatible with mild chronic small vessel ischemic disease. There is mild cerebral atrophy. No mass, midline shift, or extra-axial fluid collection is identified. Major intracranial vascular flow voids are preserved. ORBITS: Bilateral cataract extraction. SINUSES AND MASTOIDS: Minimal mucosal thickening in the paranasal sinuses. Trace bilateral mastoid fluid. BONES AND SOFT TISSUES: Normal marrow signal. No acute soft tissue abnormality. IMPRESSION: 1. Multiple posterior circulation infarcts of varying ages. 2. Large acute to early subacute right cerebellar infarct and small acute to early subacute infarcts in the left temporo-occipital region. 3. Large, older subacute right PCA infarct. 4. Small late subacute infarcts in the left cerebellum. Electronically signed by: Dasie Hamburg MD 06/15/2024 05:24 PM EST RP Workstation: [id number]     Assessment/Plan: Diagnosis: multiple embolic strokes including B/L cerebellum, R occipital, R temporal and R PCA subacute strokes due to Afib not on [facility] Does the need for close, 24 hr/day medical supervision in concert with the patient's rehab needs make it unreasonable for this patient to be served in a less intensive setting? Yes Co-Morbidities requiring supervision/potential complications: Afib not on AC; CAD s/p CABG, Htn, HLD; renal artery stenosis; CKD3B baseline Cr 1.5; vertigo; dizziness- nausea improved- dysphagia; L partial visual loss, subacute/chronic low back pain since fall 2 months ago Due to bladder management, bowel management, safety, skin/wound care, disease management, medication administration, pain management, and patient education, does the patient require 24 hr/day rehab nursing? Yes Does the patient require coordinated  care of a physician, rehab nurse, therapy disciplines of PT, OT and SLP for swallowing to  address physical and functional deficits in the context of the above medical diagnosis(es)? Yes Addressing deficits in the following areas: balance, endurance, locomotion, strength, transferring, bowel/bladder control, bathing, dressing, feeding, grooming, toileting, and swallowing Can the patient actively participate in an intensive therapy program of at least 3 hrs of therapy per day at least 5 days per week? Yes The potential for patient to make measurable gains while on inpatient rehab is good Anticipated functional outcomes upon discharge from inpatient rehab are modified independent  with PT, modified independent with OT, modified independent with SLP. Estimated rehab length of stay to reach the above functional goals is: ~ 7 days Anticipated discharge destination: Home Overall Rehab/Functional Prognosis: good  RECOMMENDATIONS: This patient's condition is appropriate for continued rehabilitative care in the following setting: CIR Patient has agreed to participate in recommended program. Yes Note that insurance prior authorization may be required for reimbursement for recommended care.  Comment:  Patient has partial visual loss- no double vision currently. 2. Mild L sided weakness- Pending CT to look for hemorrhage to see if can start Eliquis  SLP for D3 thin diet- dysphagia Constipation- if doesn't go by tomorrow, suggest Senna 1-2 tabs as needed  Pt is an excellent patient for inpt rehab- he requires all 3 forms of therapy to work on balance, visual cues, esp in setting of severe hearing loss; gait; and since has posterior lean/bias, is at high risk of falls if doesn't improve, even with RW- also min-max A for ADLs- and needs speech for swallowing- medically, pt needs assistance with BP mgmt, Afib, as well as monitoring H/H after starts Eliquis . He also is at high risk for falls. His WBC is also still elevated at 11.4- possible concern for underlying infection.  7. Pt also having HA's and  chronic back pain- needs titration of meds for this.  8. Will d/w admissions coordinator about inpt admission for pt.    Sander Speckman, MD 06/17/2024    I spent a total of 65   minutes on total care today- >50% coordination of care- due to  Review of chart esp therapy notes; labs, and vitals- also history and exam with pt and family- and d/w nursing- also documentation and spoke with admissions coordinator.

## 2024-06-17 NOTE — Progress Notes (Signed)
 Physical Therapy Treatment Patient Details Name: Dustin Ashley MRN: [id number] DOB: 02/26/1941 Today's Date: 06/17/2024   History of Present Illness 83 y.o. male presents to [facility] 06/15/24 with sudden onset of dizziness s/p fall. 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 PMHx: atrial fibrillation not on anticoagulation, history of hypertension, hyperlipidemia, history of coronary artery disease status post CABG and renal artery stenosis    PT Comments  Pt received in supine and agreeable to session. Pt demonstrates improved stability this session and reports no dizziness throughout session. Pt able to increased gait distance with min A and 1 standing rest break. Pt able to perform x5 serial STS without UE support. Pt initially demonstrates posterior bias, but progresses to CGA with improved anterior weight shift. Pt continues to benefit from PT services to progress toward functional mobility goals.     If plan is discharge home, recommend the following: A lot of help with walking and/or transfers;A lot of help with bathing/dressing/bathroom;Assist for transportation;Help with stairs or ramp for entrance   Can travel by private vehicle        Equipment Recommendations  Wheelchair (measurements PT);Wheelchair cushion (measurements PT)    Recommendations for Other Services       Precautions / Restrictions Precautions Precautions: Fall Recall of Precautions/Restrictions: Intact Precaution/Restrictions Comments: HoH Restrictions Weight Bearing Restrictions Per Provider Order: No     Mobility  Bed Mobility Overal bed mobility: Needs Assistance Bed Mobility: Supine to Sit   Sidelying to sit: Supervision, Used rails       General bed mobility comments: increased time and effort    Transfers Overall transfer level: Needs assistance Equipment used: Rolling walker (2 wheels) Transfers: Sit to/from Stand Sit to  Stand: Min assist, Contact guard assist           General transfer comment: STS with min A progressing to CGA from recliner without UE support and cues for anterior weight shift    Ambulation/Gait Ambulation/Gait assistance: Min assist, +2 safety/equipment Gait Distance (Feet): 100 Feet (x2) Assistive device: Rolling walker (2 wheels) Gait Pattern/deviations: Step-through pattern, Decreased stride length, Drifts right/left, Trunk flexed       General Gait Details: Pt demonstrates unsteadiness and reliance on RW support and min A for balance. Pt demonstrates low foot clearance, but is able to slightly improve with cues. chair follow for safety, but pt only requiring a standing rest break   Stairs             Wheelchair Mobility     Tilt Bed    Modified Rankin (Stroke Patients Only) Modified Rankin (Stroke Patients Only) Pre-Morbid Rankin Score: No symptoms Modified Rankin: Moderately severe disability     Balance Overall balance assessment: Needs assistance Sitting-balance support: No upper extremity supported, Feet supported Sitting balance-Leahy Scale: Fair Sitting balance - Comments: EOB   Standing balance support: Bilateral upper extremity supported, During functional activity, Reliant on assistive device for balance Standing balance-Leahy Scale: Poor Standing balance comment: reliant on RW and external assist                            Communication Communication Communication: Impaired Factors Affecting Communication: Hearing impaired  Cognition Arousal: Alert Behavior During Therapy: WFL for tasks assessed/performed   PT - Cognitive impairments: Awareness, Safety/Judgement, Memory  Following commands: Impaired Following commands impaired: Follows multi-step commands inconsistently, Only follows one step commands consistently    Cueing Cueing Techniques: Verbal cues, Tactile cues  Exercises Other  Exercises Other Exercises: x5 serial STS without UE support    General Comments        Pertinent Vitals/Pain Pain Assessment Pain Assessment: No/denies pain     PT Goals (current goals can now be found in the care plan section) Acute Rehab PT Goals Patient Stated Goal: to get better PT Goal Formulation: With patient/family Time For Goal Achievement: 06/30/24 Progress towards PT goals: Progressing toward goals    Frequency    Min 3X/week       AM-PAC PT 6 Clicks Mobility   Outcome Measure  Help needed turning from your back to your side while in a flat bed without using bedrails?: A Little Help needed moving from lying on your back to sitting on the side of a flat bed without using bedrails?: A Little Help needed moving to and from a bed to a chair (including a wheelchair)?: A Little Help needed standing up from a chair using your arms (e.g., wheelchair or bedside chair)?: A Little Help needed to walk in hospital room?: A Little Help needed climbing 3-5 steps with a railing? : A Lot 6 Click Score: 17    End of Session Equipment Utilized During Treatment: Gait belt Activity Tolerance: Patient tolerated treatment well Patient left: in chair;with call bell/phone within reach;with chair alarm set;with family/visitor present Nurse Communication: Mobility status PT Visit Diagnosis: Unsteadiness on feet (R26.81);Other abnormalities of gait and mobility (R26.89);Difficulty in walking, not elsewhere classified (R26.2)     Time: 8865-8844 PT Time Calculation (min) (ACUTE ONLY): 21 min  Charges:    $Gait Training: 8-22 mins PT General Charges $$ ACUTE PT VISIT: 1 Visit                    Darryle George, PTA Acute Rehabilitation Services Secure Chat Preferred  [phone]    Darryle George 06/17/2024, 12:21 PM

## 2024-06-17 NOTE — Progress Notes (Signed)
 STROKE TEAM PROGRESS NOTE    SIGNIFICANT HOSPITAL EVENTS 06/15/2024 - Admitted  INTERIM HISTORY/SUBJECTIVE Patient lying comfortably in bed.  His son is at the bedside.  Continues to have left-sided partial vision loss.  No complaints today.  Plan to repeat CT head tomorrow and if hemorrhagic transformation is reduced may switch to Eliquis . OBJECTIVE  CBC    Component Value Date/Time   WBC 11.4 (H) 06/17/2024 0248   RBC 4.64 06/17/2024 0248   HGB 13.4 06/17/2024 0248   HCT 39.8 06/17/2024 0248   PLT 160 06/17/2024 0248   MCV 85.8 06/17/2024 0248   MCH 28.9 06/17/2024 0248   MCHC 33.7 06/17/2024 0248   RDW 13.3 06/17/2024 0248   LYMPHSABS 2.4 06/17/2024 0248   MONOABS 1.1 (H) 06/17/2024 0248   EOSABS 0.3 06/17/2024 0248   BASOSABS 0.1 06/17/2024 0248    BMET    Component Value Date/Time   NA 140 06/17/2024 0248   K 3.8 06/17/2024 0248   CL 107 06/17/2024 0248   CO2 27 06/17/2024 0248   GLUCOSE 110 (H) 06/17/2024 0248   GLUCOSE 125 (H) 07/17/2006 1002   BUN 28 (H) 06/17/2024 0248   CREATININE 1.50 (H) 06/17/2024 0248   CALCIUM  8.5 (L) 06/17/2024 0248   GFRNONAA 46 (L) 06/17/2024 0248    IMAGING past 24 hours ECHOCARDIOGRAM COMPLETE Result Date: 06/16/2024    ECHOCARDIOGRAM REPORT   Patient Name:   Dustin Ashley [facility] Date of Exam: 06/16/2024 Medical Rec #:  [id number]       Height:       69.0 in Accession #:    [id number]      Weight:       200.0 lb Date of Birth:  [birth year]       BSA:          2.066 m Patient Age:    83 years        BP:           155/80 mmHg Patient Gender: M               HR:           106 bpm. Exam Location:  Inpatient Procedure: 2D Echo, Cardiac Doppler and Color Doppler (Both Spectral and Color            Flow Doppler were utilized during procedure). Indications:    Stroke  History:        Patient has prior history of Echocardiogram examinations, most                 recent 04/09/2024. Arrythmias:Atrial Fibrillation; Risk                 Factors:Dyslipidemia  and Hypertension.  Sonographer:    Sherlean Dubin Referring Phys: [id number] [facility] POKHREL IMPRESSIONS  1. Left ventricular ejection fraction, by estimation, is 60 to 65%. The left ventricle has normal function. Left ventricular endocardial border not optimally defined to evaluate regional wall motion. Left ventricular diastolic function could not be evaluated.  2. Right ventricular systolic function is normal. The right ventricular size is normal.  3. The mitral valve is normal in structure. No evidence of mitral valve regurgitation. No evidence of mitral stenosis.  4. The aortic valve is normal in structure. Aortic valve regurgitation is not visualized. No aortic stenosis is present. Comparison(s): No significant change from prior study. FINDINGS  Left Ventricle: Left ventricular ejection fraction, by estimation, is 60 to 65%. The left ventricle has normal function.  Left ventricular endocardial border not optimally defined to evaluate regional wall motion. The left ventricular internal cavity size was normal in size. Suboptimal image quality limits for assessment of left ventricular hypertrophy. Left ventricular diastolic function could not be evaluated. Right Ventricle: The right ventricular size is normal. No increase in right ventricular wall thickness. Right ventricular systolic function is normal. Left Atrium: Left atrial size was not well visualized. Right Atrium: Right atrial size was not well visualized. Pericardium: The pericardium was not well visualized. Mitral Valve: The mitral valve is normal in structure. No evidence of mitral valve regurgitation. No evidence of mitral valve stenosis. Tricuspid Valve: The tricuspid valve is not well visualized. Tricuspid valve regurgitation is not demonstrated. No evidence of tricuspid stenosis. Aortic Valve: The aortic valve is normal in structure. Aortic valve regurgitation is not visualized. No aortic stenosis is present. Aortic valve peak gradient measures 4.6  mmHg. Pulmonic Valve: The pulmonic valve was not well visualized. Pulmonic valve regurgitation is not visualized. Aorta: The aortic root is normal in size and structure. Venous: The inferior vena cava was not well visualized. IAS/Shunts: The interatrial septum was not well visualized.  LEFT VENTRICLE PLAX 2D LVIDd:         4.30 cm   Diastology LVIDs:         2.80 cm   LV e' medial:  8.81 cm/s LV PW:         0.90 cm   LV e' lateral: 14.80 cm/s LV IVS:        0.90 cm LVOT diam:     2.00 cm LV SV:         41 LV SV Index:   20 LVOT Area:     3.14 cm  RIGHT VENTRICLE RV Basal diam:  3.90 cm    PULMONARY VEINS RV Mid diam:    3.20 cm    Diastolic Velocity: 17.70 cm/s RV S prime:     7.07 cm/s  S/D Velocity:       2.20 TAPSE (M-mode): 2.0 cm     Systolic Velocity:  39.00 cm/s LEFT ATRIUM             Index        RIGHT ATRIUM           Index LA diam:        4.30 cm 2.08 cm/m   RA Area:     22.80 cm LA Vol (A2C):   78.8 ml 38.14 ml/m  RA Volume:   58.40 ml  28.27 ml/m LA Vol (A4C):   53.9 ml 26.09 ml/m LA Biplane Vol: 65.3 ml 31.61 ml/m  AORTIC VALVE AV Area (Vmax): 2.31 cm AV Vmax:        107.00 cm/s AV Peak Grad:   4.6 mmHg LVOT Vmax:      78.80 cm/s LVOT Vmean:     55.200 cm/s LVOT VTI:       0.131 m  AORTA Ao Root diam: 3.20 cm Ao Asc diam:  2.90 cm MITRAL VALVE             TRICUSPID VALVE MV Area (PHT): 4.41 cm  TR Peak grad:   11.6 mmHg MV Decel Time: 172 msec  TR Vmax:        170.00 cm/s                           SHUNTS  Systemic VTI:  0.13 m                          Systemic Diam: 2.00 cm Joelle Cedars Tonleu Electronically signed by Joelle Cedars Tonleu Signature Date/Time: 06/16/2024/4:30:45 PM    Final     Vitals:   06/17/24 0046 06/17/24 0511 06/17/24 0723 06/17/24 1113  BP: 139/77 (!) 144/74 (!) 180/88 (!) 156/67  Pulse: 97 (!) 105 (!) 55 98  Resp: 18 18 16 17   Temp: 98.7 F (37.1 C) 97.7 F (36.5 C) 98.7 F (37.1 C) 97.8 F (36.6 C)  TempSrc: Oral Oral Oral Oral   SpO2: 94% 93% 94% 94%  Weight:      Height:         PHYSICAL EXAM General:  Alert, well-nourished, well-developed patient in no acute distress Psych:  Mood and affect appropriate for situation CV: Regular rate and rhythm on monitor Respiratory:  Regular, unlabored respirations on room air GI: Abdomen soft and nontender   NEURO:  Mental Status: AA&Ox3 Speech/Language: speech is without dysarthria or aphasia.  Naming, repetition, fluency, and comprehension intact. Pt is hard of hearing per family.  Cranial Nerves:  II: PERRL. Visual fields full, but may be compensating on the L visual field. Will continue to assess. III, IV, VI: EOMI. Eyelids elevate symmetrically.  V: Sensation is intact to light touch and symmetrical to face.  VII: Face is symmetrical resting and smiling VIII: hearing intact to loud voice. IX, X: Palate elevates symmetrically. Phonation is normal.  KP:Dynloizm shrug 5/5. XII: tongue is midline without fasciculations. Motor: 5/5 strength to all muscle groups tested.  Tone: is normal and bulk is normal Sensation- Intact to light touch bilaterally. Extinction absent to light touch to DSS.   Coordination: FTN intact bilaterally, HKS: no ataxia in BLE.No drift.  Gait- deferred      ASSESSMENT/PLAN  Mr. CUNG MASTERSON is a 83 y.o. male with history of atrial fibrillation (not compliant with Eliquis , complaint with ASA), HTN, HLD, CAD post CABG, and Renal Artery Stenosis.  Admitted for Ischemic stroke.   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  Repeat CT Head on 12/5 assessing for hemorrhage prior to restarting Eliquis  CTA head & neck:  Occlusion of R vertebral artery.  MRI: Multiple posterior circulation infarcts of differing ages. Large acute to early subacute right cerebellar infarct and small acute to early subacute infarcts in the left temporo-occipital region. Large older subacute R PCA infarct. Small late subacute infarcts in the L cerebellum. 2D Echo pending LDL 60 HgbA1c 5.7 VTE prophylaxis - Lovenox  Anticoagulation: aspirin  81 mg daily prior to admission, now on clopidogrel 75 mg daily. Plan to repeat CT head in 2 days (12/5) for concern of hemorrhagic transformation, if negative, will restart Eliquis  for plan of Plavix + Eliquis  for 90 days, then just Eliquis  indefinitely.  Therapy recommendations:  Pending Disposition:  Pending Therapy recs  Hx of Stroke/TIA Not previously recognized, but imaging suggests multiple prior infarcts.  Atrial fibrillation Home Meds: Eliquis  (was not taking) ASA (was taking) Continue telemetry monitoring Begin anticoagulation with Plavix for now (see above)  Hypertension Home meds:  Norvasc  5mg , Benazepril  40mg , Atenolol  50mg  Stable Blood Pressure Goal:  SBP less than 160   Hyperlipidemia Home meds:  Lipitor 20mg , resumed in hospital LDL 60, goal < 70 Pt at goal, no change recommended Continue statin at discharge  Diabetes  Home meds:  none HgbA1c 5.7, goal < 7.0  Substance Abuse UDS pending  Dysphagia Patient has post-stroke dysphagia, SLP consulted    Diet   DIET DYS 3 Room service appropriate? Yes; Fluid consistency: Thin  Advance diet as tolerated  Other Stroke Risk Factors ETOH use, alcohol level <15 Obesity, Body mass index is 29.53 kg/m., BMI >/= 30 associated with increased stroke risk, recommend weight loss, diet and exercise as appropriate  Coronary artery disease  Other Active Problems   Hospital day # 2   Patient with atrial fibrillation not compliant with Eliquis  presented with dizziness and nausea vomiting secondary to right cerebellar as well  as medial temporal occipital infarcts.  MRI also shows older age left cerebellar and right occipital infarcts.  Stroke etiology likely proximal right vertebral artery stenosis/occlusion with distal embolization.  Patient also has atrial fibrillation and has not been compliant with Eliquis .  Recommend dual antiplatelet therapy aspirin  and Plavix for a few days and then switch to aspirin  and Eliquis  after hemorrhagic transformation is stable on repeat CT scan to be checked tomorrow morning.  Mobilize out of bed.  Therapy consults.  Patient advised not to drive due to peripheral vision loss improved.  Long discussion with patient and his son at the bedside and answered questions.      Eather Popp, MD Medical Director [facility] Pager: [phone] 06/17/2024 12:33 PM   To contact Stroke Continuity provider, please refer to [email or web address]. After hours, contact General Neurology

## 2024-06-17 NOTE — PMR Pre-admission (Signed)
 PMR Admission Coordinator Pre-Admission Assessment  Patient: Dustin Ashley is an 83 y.o., male MRN: [id number] DOB: Jan 29, 1941 Height: 5' 9 (175.3 cm) Weight: 90.7 kg  Insurance Information HMO:     PPO:   yes   PCP:      IPA:      80/20:      OTHER:  PRIMARY: U[employer] Medicare      Policy#: [id number]      Subscriber: self CM Name: UM dept      Phone#: [phone]     Fax#: [phone] Pre-Cert#: [id number] I received  auth for CIR from Shephanie with [facility] Medicare for admit 06/21/24 through 06/28/24.  Updates due to UM dept on 06/28/24 at fax listed above.       Employer: Retired Benefits:  Phone #: [phone]     Name:  Eustacio Date: 07/16/2023 - 07/14/2024 Deductible: $0 (does not have deductible) OOP Max: $3,900 ($521.43 met) CIR: $300/day co-pay for days 1-5, $0/day co-pay for days 6+ SNF: $0.00 Copayment per day for days 1-20; $203 Copayment per day for days 21-100 for Medicare-covered care/maximum 100 days/benefit period Outpatient: $20 copay/visit Home Health:  100% coverage; limited by medical necessity DME: 80% coverage; 20% co-insurance SECONDARY:       Policy#:      Phone#:   Artist:       Phone#:   The Engineer[employer] Information Summary" for patients in Inpatient Rehabilitation Facilities with attached "Privacy Act Statement-Health Care Records" was provided and verbally reviewed with: Patient  Emergency Contact Information Contact Information     Name Relation Home Work [place] Son [phone]  [phone]   Byron, Peacock   [phone]      Other Contacts     Name Relation Home Work Mobile   Venier,Wanda Relative   [phone]       Current Medical History  Patient Admitting Diagnosis: R CVA  History of Present Illness: Dustin Ashley is an 83 year old RH-male with history of CAD, CKD II, PAF, PE/DVT, HOH, RAS who was admitted on 06/15/24 with sudden onset of N/V with vertigo night prior to admission and was on floor  all right till next day when he was able to finally crawl and call family. CT head done revealing acute to subacute infarcts in posterior medial right temporal, right occipital, right cerebellum and potentially within brainstem. CTA head/neck showed multiple PCA infarcts, negative for LVO and occlusion of right VA V1 and V2 segments with reconstitution of distal segments. WBC elevated at 18.8 and BNP 242.6. MRI brain showed multiple PCA infarcts of varying age, large acute to subacute right cerebellar, left temporo-occipital lobe and large older subacute R-PCA infarct and small late subacute infarcts in left cerebellum. .    Stroke felt to be in setting of A fib and non compliance with eliquis . Plavix  added briefly with Follow up CT head done 12/05 showing mild worsening of edema with petechial hemorrhage and mildly increased effacement of 4th ventricle with slight dilatation of lateral and third ventricles question early hydrocephalus. He was transitioned to Eliquis  and Plavix  discontinued.  2 D echo showed EF 60-65%. Follow up labs showed acute on chronic renal failure He continues to be limited by dizzines and balance deficits. PT/OT/ST has been working with patient who requires min to mod assist with mobility, min assist with ADLs and mild cognitive linguistic deficits noted with difficulty following multistep commands.  [name] He lives alone and was independent PTA. Family lives  near by and will assist after discharge.     Complete NIHSS TOTAL: 0  Patient's medical record from [facility] has been reviewed by the rehabilitation admission coordinator and physician.  Past Medical History  Past Medical History:  Diagnosis Date   Allergic rhinitis    Allergy    seasonal   Arthritis    Bell palsy    CAD (coronary artery disease)    Cataract    Chronic renal insufficiency, stage II (mild)    Cr ~ 1.4, u/s 4-12 normal kidneys   Gallstone pancreatitis 2015   GERD (gastroesophageal reflux  disease)    HOH (hard of hearing)    has a hearing aid L, sees audiology rountinely   Hyperglycemia    A1C 5.8 04-2010   Hyperlipemia    Hypertension    Pain, joint, multiple sites    uses valium , occ uses for insomnia. uses for shoulder  pain   Paroxysmal atrial fibrillation [facility])    Personal history of colonic polyps    Pulmonary emboli (HCC)    02-2014   RAS (renal artery stenosis) 05-2012    Renal insufficiency    SCC (squamous cell carcinoma)     Has the patient had major surgery during 100 days prior to admission? No  Family History   family history includes Heart attack in his brother; Hypertension in his father.  Current Medications  Current Facility-Administered Medications:    [DISCONTINUED] acetaminophen  (TYLENOL ) tablet 650 mg, 650 mg, Oral, Q4H PRN, 650 mg at 06/15/24 1747 **OR** [DISCONTINUED] acetaminophen  (TYLENOL ) 160 MG/5ML solution 650 mg, 650 mg, Per Tube, Q4H PRN **OR** acetaminophen  (TYLENOL ) suppository 650 mg, 650 mg, Rectal, Q4H PRN, Pokhrel, Laxman, MD   acetaminophen  (TYLENOL ) tablet 650 mg, 650 mg, Oral, Q6H PRN, Pokhrel, Laxman, MD, 650 mg at 06/17/24 0920   amLODipine  (NORVASC ) tablet 5 mg, 5 mg, Oral, Daily, Pahwani, Ravi, MD, 5 mg at 06/17/24 0919   aspirin  EC tablet 81 mg, 81 mg, Oral, Daily, Pahwani, Ravi, MD, 81 mg at 06/17/24 0920   atenolol  (TENORMIN ) tablet 75 mg, 75 mg, Oral, Daily, Pahwani, Ravi, MD, 75 mg at 06/17/24 9080   atorvastatin  (LIPITOR) tablet 20 mg, 20 mg, Oral, Daily, Pokhrel, Laxman, MD, 20 mg at 06/17/24 0920   clopidogrel  (PLAVIX ) tablet 75 mg, 75 mg, Oral, Daily, Prunty, Donald B, DO, 75 mg at 06/17/24 0920   diazepam  (VALIUM ) tablet 10 mg, 10 mg, Oral, Q12H PRN, Pokhrel, Laxman, MD   enoxaparin  (LOVENOX ) injection 40 mg, 40 mg, Subcutaneous, Q24H, Pokhrel, Laxman, MD, 40 mg at 06/16/24 1818   hydrALAZINE  (APRESOLINE ) injection 10 mg, 10 mg, Intravenous, Q6H PRN, Pahwani, Ravi, MD   meclizine  (ANTIVERT ) tablet 25 mg, 25 mg,  Oral, TID PRN, Pokhrel, Laxman, MD, 25 mg at 06/16/24 0741   oxyCODONE  (Oxy IR/ROXICODONE ) immediate release tablet 2.5 mg, 2.5 mg, Oral, Q6H PRN, Pokhrel, Laxman, MD, 2.5 mg at 06/15/24 1856   senna-docusate (Senokot-S) tablet 1 tablet, 1 tablet, Oral, QHS PRN, Pokhrel, Laxman, MD  Patients Current Diet:  Diet Order             DIET DYS 3 Room service appropriate? Yes; Fluid consistency: Thin  Diet effective now                   Precautions / Restrictions Precautions Precautions: Fall Precaution/Restrictions Comments: HoH Restrictions Weight Bearing Restrictions Per Provider Order: No   Has the patient had 2 or more falls or a fall with injury  in the past year? No  Prior Activity Level Community (5-7x/wk): Went out most days.  Was independent, driving.  Prior Functional Level Self Care: Did the patient need help bathing, dressing, using the toilet or eating? Independent  Indoor Mobility: Did the patient need assistance with walking from room to room (with or without device)? Independent  Stairs: Did the patient need assistance with internal or external stairs (with or without device)? Independent  Functional Cognition: Did the patient need help planning regular tasks such as shopping or remembering to take medications? Independent  Patient Information Are you of Hispanic, Latino/a,or Spanish origin?: A. No, not of Hispanic, Latino/a, or Spanish origin What is your race?: A. White Do you need or want an interpreter to communicate with a doctor or health care staff?: 0. No  Patient's Response To:  Health Literacy and Transportation Is the patient able to respond to health literacy and transportation needs?: Yes Health Literacy - How often do you need to have someone help you when you read instructions, pamphlets, or other written material from your doctor or pharmacy?: Often In the past 12 months, has lack of transportation kept you from medical appointments or from  getting medications?: No In the past 12 months, has lack of transportation kept you from meetings, work, or from getting things needed for daily living?: No  Home Assistive Devices / Equipment Home Equipment: Agricultural Consultant (2 wheels), [employer] - single point, BSC/3in1  Prior Device Use: Indicate devices/aids used by the patient prior to current illness, exacerbation or injury? None of the above  Current Functional Level Cognition  Arousal/Alertness: Awake/alert Overall Cognitive Status: Impaired/Different from baseline Orientation Level: Oriented X4 Attention: Focused, Sustained, Selective Focused Attention: Appears intact Sustained Attention: Appears intact Selective Attention: Appears intact Memory: Impaired Memory Impairment: Decreased short term memory, Other (comment) Decreased Short Term Memory: Verbal basic, Functional basic Awareness: Impaired Awareness Impairment: Intellectual impairment Problem Solving: Impaired Problem Solving Impairment: Verbal basic, Functional basic Executive Function: Self Monitoring, Self Correcting Self Monitoring: Impaired Self Monitoring Impairment: Verbal basic, Functional basic Self Correcting: Impaired Self Correcting Impairment: Verbal basic, Functional basic Safety/Judgment: Appears intact    Extremity Assessment (includes Sensation/Coordination)  Upper Extremity Assessment: Overall WFL for tasks assessed  Lower Extremity Assessment: Defer to PT evaluation    ADLs  Overall ADL's : Needs assistance/impaired Eating/Feeding: Minimal assistance, Sitting Grooming: Minimal assistance, Standing, Wash/dry hands Grooming Details (indicate cue type and reason): sinkside, needs  A for proper judgement of power for soap dispenser and sink faucet (did not correct extremely high water pressure from faucet) Upper Body Dressing : Minimal assistance Lower Body Dressing: Maximal assistance, Sitting/lateral leans Toilet Transfer: Minimal assistance,  Ambulation, Regular Toilet, Rolling walker (2 wheels), Grab bars, Moderate assistance Toilet Transfer Details (indicate cue type and reason): VC to use GB's, cues for safe approach to toilet Toileting- Clothing Manipulation and Hygiene: Minimal assistance, Cueing for sequencing, Sitting/lateral lean Toileting - Clothing Manipulation Details (indicate cue type and reason): sequencing cues, assist to locate toilet paper dispenser Functional mobility during ADLs: Moderate assistance, +2 for physical assistance, +2 for safety/equipment, Rolling walker (2 wheels)    Mobility  Overal bed mobility: Needs Assistance Bed Mobility: Supine to Sit Rolling: Supervision, Used rails Sidelying to sit: Supervision, Used rails General bed mobility comments: increased time and effort    Transfers  Overall transfer level: Needs assistance Equipment used: Rolling walker (2 wheels) Transfers: Sit to/from Stand Sit to Stand: Min assist, Contact guard assist Bed to/from chair/wheelchair/BSC transfer type::  Step pivot Step pivot transfers: Mod assist General transfer comment: STS with min A progressing to CGA from recliner without UE support and cues for anterior weight shift    Ambulation / Gait / Stairs / Wheelchair Mobility  Ambulation/Gait Ambulation/Gait assistance: Min assist, +2 safety/equipment Gait Distance (Feet): 100 Feet (x2) Assistive device: Rolling walker (2 wheels) Gait Pattern/deviations: Step-through pattern, Decreased stride length, Drifts right/left, Trunk flexed General Gait Details: Pt demonstrates unsteadiness and reliance on RW support and min A for balance. Pt demonstrates low foot clearance, but is able to slightly improve with cues. chair follow for safety, but pt only requiring a standing rest break    Posture / Balance Dynamic Sitting Balance Sitting balance - Comments: EOB Balance Overall balance assessment: Needs assistance Sitting-balance support: No upper extremity  supported, Feet supported Sitting balance-Leahy Scale: Fair Sitting balance - Comments: EOB Postural control: Right lateral lean Standing balance support: Bilateral upper extremity supported, During functional activity, Reliant on assistive device for balance Standing balance-Leahy Scale: Poor Standing balance comment: reliant on RW and external assist    Special considerations/life events  Special service needs none   Previous Home Environment (from acute therapy documentation) Living Arrangements: Alone  Lives With: Alone Available Help at Discharge: Family, Friend(s), Available 24 hours/day Type of Home: House Home Layout: Two level, Able to live on main level with bedroom/bathroom, 1/2 bath on main level Alternate Level Stairs-Rails: Right Alternate Level Stairs-Number of Steps: 14 Home Access: Stairs to enter Entrance Stairs-Rails: Left Entrance Stairs-Number of Steps: 5-6 Bathroom Shower/Tub: Engineer, Manufacturing Systems: Standard Home Care Services: No Additional Comments: Pt will either d/c to his home (above) or his son's  Discharge Living Setting Plans for Discharge Living Setting: Patient's home, House, Alone (Lived alone PTA.  Son and family to assist with 24/7 supervision) Type of Home at Discharge: House Discharge Home Layout: Two level Alternate Level Stairs-Rails: Right Alternate Level Stairs-Number of Steps: 14 Discharge Home Access: Stairs to enter Entrance Stairs-Rails: Left Entrance Stairs-Number of Steps: 5-6 Discharge Bathroom Shower/Tub: Tub/shower unit, Curtain Discharge Bathroom Toilet: Standard Discharge Bathroom Accessibility: Yes How Accessible: Accessible via walker Does the patient have any problems obtaining your medications?: No  Social/Family/Support Systems Patient Roles: Parent, Other (Comment) (Has son, dtr in social worker and grandson) Solicitor Information: Herbalist - son - Anticipated Caregiver: Son, DTR in social worker and grandson Caregiver  Availability: 24/7 Discharge Plan Discussed with Primary Caregiver: Yes Is Caregiver In Agreement with Plan?: Yes Does Caregiver/Family have Issues with Lodging/Transportation while Pt is in Rehab?: No  Goals Patient/Family Goal for Rehab: PT/OT/SLP mod I goals Expected length of stay:  7 days Pt/Family Agrees to Admission and willing to participate: Yes Program Orientation Provided & Reviewed with Pt/Caregiver Including Roles  & Responsibilities: Yes  Decrease burden of Care through IP rehab admission: N/A  Possible need for SNF placement upon discharge: Not anticipated  Patient Condition: I have reviewed medical records from [facility], spoken with CSW, and patient and son. I met with patient at the bedside for inpatient rehabilitation assessment.  Patient will benefit from ongoing PT, OT, and SLP, can actively participate in 3 hours of therapy a day 5 days of the week, and can make measurable gains during the admission.  Patient will also benefit from the coordinated team approach during an Inpatient Acute Rehabilitation admission.  The patient will receive intensive therapy as well as Rehabilitation physician, nursing, social worker, and care management interventions.  Due to bladder management, bowel management,  safety, skin/wound care, disease management, medication administration, pain management, and patient education the patient requires 24 hour a day rehabilitation nursing.  The patient is currently min/mod assist with mobility and basic ADLs.  Discharge setting and therapy post discharge at home with home health is anticipated.  Patient has agreed to participate in the Acute Inpatient Rehabilitation Program and will admit today.  Preadmission Screen Completed By:  Lovett CHRISTELLA Ropes, 06/17/2024 2:14 PM with updates by Leita Kleine, MS, CCC-SLP  ______________________________________________________________________   Discussed status with Dr. Babs on 06/21/24 at 1235 and received  approval for admission today.  Admission Coordinator:  Lovett CHRISTELLA Ropes, RN, time 1235/Date 06/21/24   Assessment/Plan: Diagnosis: embolic CVA Does the need for close, 24 hr/day Medical supervision in concert with the patient's rehab needs make it unreasonable for this patient to be served in a less intensive setting? Yes Co-Morbidities requiring supervision/potential complications: CADm CKD II, PAF, RAS Due to bladder management, bowel management, safety, skin/wound care, disease management, medication administration, pain management, and patient education, does the patient require 24 hr/day rehab nursing? Yes Does the patient require coordinated care of a physician, rehab nurse, PT, OT, and SLP to address physical and functional deficits in the context of the above medical diagnosis(es)? Yes Addressing deficits in the following areas: balance, endurance, locomotion, strength, transferring, bowel/bladder control, bathing, dressing, feeding, grooming, toileting, and psychosocial support Can the patient actively participate in an intensive therapy program of at least 3 hrs of therapy 5 days a week? Yes The potential for patient to make measurable gains while on inpatient rehab is excellent Anticipated functional outcomes upon discharge from inpatient rehab: modified independent PT, modified independent OT, modified independent SLP Estimated rehab length of stay to reach the above functional goals is: 7 days Anticipated discharge destination: Home 10. Overall Rehab/Functional Prognosis: excellent   MD Signature: Arthea IVAR Babs, MD, [facility] Physical Medicine & Rehabilitation Medical Director Rehabilitation Services 06/21/2024

## 2024-06-17 NOTE — Progress Notes (Signed)
 IP rehab admissions - I met with patient and his son at the bedside.  Patient lived alone PTA.  I explained to son that patient will need 24/7 supervision after a potential rehab stay.  Son is aware and says that he, his wife and his son can all help to provide supervision after discharge from rehab.  I gave son rehab booklets and I explained rehab process to patient and his son.  I have asked a rehab MD to consult on this patient.  I will follow up after rehab MD consult is completed.  [phone]

## 2024-06-17 NOTE — Progress Notes (Signed)
 PROGRESS NOTE    Dustin Ashley  FMW:[id number] DOB: August 14, 1940 DOA: 06/15/2024 PCP: Dustin Aloysius BRAVO, MD   Brief Narrative:  HPI:  Patient is a 83 year old male with past medical history of atrial fibrillation not on anticoagulation, history of hypertension, hyperlipidemia, history of coronary artery disease status post CABG and renal artery stenosis presented to hospital with sudden onset of dizziness around 11:30 PM yesterday night.  It was also accompanied by nausea and multiple episodes of vomiting with vertigo.  Vertigo worse with movement of the head and position.  Patient denied any fever, chills or rigor.  Denied any headache, shortness of breath, chest pain or dyspnea.  Denied any focal weakness.  He then had a fall.  Patient denied any urinary urgency, frequency or dysuria.  Patient was then brought into the hospital.  In the ED, blood pressure was slightly elevated.  Labs were notable for creatinine elevation at 1.3.  Lactate of 1.9.  WBC elevated at 17.8.  CK level was 109.  BNP of 242.  CT head scan showed acute infarct in the posterior middle right temporal lobe occipital lobe and right cerebellum.  Code stroke was called in and neurology saw the patient.  Patient was then considered for admission to hospital for further evaluation and treatment   Assessment & Plan:   Principal Problem:   Acute stroke due to ischemia [facility]) Active Problems:   Hyperlipidemia   Essential hypertension   Paroxysmal A-fib (HCC)  Vertigo secondary to acute possible embolic stroke/essential hypertension/hyperlipidemia. MRI of the brain showed multiple posterior circulation infarct of varying ages with a large acute to early subacute right cerebellar infarct and acute to early subacute infarct in the left temporal parietal region and large subacute right PCA infarct.  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  Echo shows normal ejection fraction and no PFO.  Symptoms started more than 48 hours ago, no need for permissive hypertension anymore.  Blood pressure elevated.  Will gradually resume home medications starting with amlodipine  and atenolol .  Hold Lotensin  for now.  LDL only 60 but HDL 38, patient has been started on atorvastatin .  Remains on aspirin  and Plavix  and neurology recommends to continue this for next 1 to 2 days and stable repeat CT head and based on the results, might start on Eliquis  with aspirin  but no Plavix .  Neurology managing.  Seen by PT OT, CIR recommended.  He is being evaluated for CIR.  Atrial fibrillation not on anticoagulation.  Will continue telemetry monitoring.  Currently on aspirin  and Plavix .  Does not take anticoagulation but is on decision despite of understanding that he was at risk of a stroke for not taking blood thinners.  Patient is willing to take Eliquis  now.  Neurology plans to repeat CT head in 1 to 2 days to follow-up on hemorrhagic conversion and then we will provide further recommendations.  Follow neurology recommendations about anticoagulation.   History of insomnia.  On diazepam .  Will continue   History of CAD status post CABG, renal artery stenosis.  Continue aspirin  and statins   CKD stage IIIb: Baseline creatinine appears to be around 1.5.  Currently at baseline.  DVT prophylaxis: enoxaparin  (LOVENOX ) injection 40 mg Start: 06/15/24 1800   Code Status: Full Code  Family Communication: None present at bedside.  Plan of care discussed with patient in length and he/she verbalized understanding and agreed  with it.  Also spoke to his son Dustin Ashley over the phone and updated him with the plan.  He already spoke to neurologist.  Status is: Inpatient Remains inpatient appropriate because: Awaiting for complete workup of the stroke, PT OT assessment, echo and neurology assessment.   Estimated body mass index is 29.53 kg/m as calculated from the  following:   Height as of this encounter: 5' 9 (1.753 m).   Weight as of this encounter: 90.7 kg.    Nutritional Assessment: Body mass index is 29.53 kg/m.[name] Seen by dietician.  I agree with the assessment and plan as outlined below: Nutrition Status:        . Skin Assessment: I have examined the patient's skin and I agree with the wound assessment as performed by the wound care RN as outlined below:    Consultants:  Neurology  Procedures:  None  Antimicrobials:  Anti-infectives (From admission, onward)    None         Subjective: Patient seen and examined.  Feels better.  Did not complain of dizziness today.  No other complaint.  Objective: Vitals:   06/16/24 2121 06/17/24 0046 06/17/24 0511 06/17/24 0723  BP: (!) 156/93 139/77 (!) 144/74 (!) 180/88  Pulse: 90 97 (!) 105 (!) 55  Resp: 18 18 18 16   Temp: 97.9 F (36.6 C) 98.7 F (37.1 C) 97.7 F (36.5 C) 98.7 F (37.1 C)  TempSrc: Oral Oral Oral Oral  SpO2: 97% 94% 93% 94%  Weight:      Height:        Intake/Output Summary (Last 24 hours) at 06/17/2024 0753 Last data filed at 06/16/2024 2000 Gross per 24 hour  Intake --  Output 300 ml  Net -300 ml   Filed Weights   06/15/24 1114  Weight: 90.7 kg    Examination:  General exam: Appears calm and comfortable  Respiratory system: Clear to auscultation. Respiratory effort normal. Cardiovascular system: S1 & S2 heard, RRR. No JVD, murmurs, rubs, gallops or clicks. No pedal edema. Gastrointestinal system: Abdomen is nondistended, soft and nontender. No organomegaly or masses felt. Normal bowel sounds heard. Central nervous system: Alert and oriented. No focal neurological deficits. Extremities: Symmetric 5 x 5 power. Skin: No rashes, lesions or ulcers.  Psychiatry: Judgement and insight appear normal. Mood & affect appropriate.   Data Reviewed: I have personally reviewed following labs and imaging studies  CBC: Recent Labs  Lab 06/15/24 1152  06/15/24 1202 06/15/24 1935 06/16/24 0137 06/17/24 0248  WBC 17.8*  --  16.2* 15.9* 11.4*  NEUTROABS 14.6*  --   --   --  7.6  HGB 14.8 15.3 14.6 14.3 13.4  HCT 44.0 45.0 42.5 42.3 39.8  MCV 86.4  --  84.8 85.6 85.8  PLT 134*  --  155 153 160   Basic Metabolic Panel: Recent Labs  Lab 06/15/24 1152 06/15/24 1202 06/15/24 1935 06/16/24 0137 06/17/24 0248  NA 138 141  --  140 140  K 4.5 4.1  --  4.2 3.8  CL 104 104  --  102 107  CO2 21*  --   --  24 27  GLUCOSE 127* 129*  --  117* 110*  BUN 37* 36*  --  27* 28*  CREATININE 1.29* 1.30* 1.26* 1.27* 1.50*  CALCIUM  9.2  --   --  9.0 8.5*  MG  --   --   --  1.8  --    GFR: Estimated Creatinine Clearance: 41.5 mL/min (A) (by  C-G formula based on SCr of 1.5 mg/dL (H)). Liver Function Tests: Recent Labs  Lab 06/15/24 1152  AST 25  ALT 19  ALKPHOS 84  BILITOT 1.0  PROT 6.6  ALBUMIN 3.6   No results for input(s): LIPASE, AMYLASE in the last 168 hours. No results for input(s): AMMONIA in the last 168 hours. Coagulation Profile: Recent Labs  Lab 06/15/24 1152  INR 1.0   Cardiac Enzymes: Recent Labs  Lab 06/15/24 1152  CKTOTAL 109   BNP (last 3 results) No results for input(s): PROBNP in the last 8760 hours. HbA1C: Recent Labs    06/16/24 0137  HGBA1C 5.8*   CBG: No results for input(s): GLUCAP in the last 168 hours. Lipid Profile: Recent Labs    06/16/24 0137  CHOL 117  HDL 38*  LDLCALC 60  TRIG 93  CHOLHDL 3.1   Thyroid  Function Tests: No results for input(s): TSH, T4TOTAL, FREET4, T3FREE, THYROIDAB in the last 72 hours. Anemia Panel: No results for input(s): VITAMINB12, FOLATE, FERRITIN, TIBC, IRON, RETICCTPCT in the last 72 hours. Sepsis Labs: Recent Labs  Lab 06/15/24 1202  LATICACIDVEN 1.9    No results found for this or any previous visit (from the past 240 hours).   Radiology Studies: ECHOCARDIOGRAM COMPLETE Result Date: 06/16/2024    ECHOCARDIOGRAM  REPORT   Patient Name:   KAYDENN MCLEAR [facility] Date of Exam: 06/16/2024 Medical Rec #:  [id number]       Height:       69.0 in Accession #:    [id number]      Weight:       200.0 lb Date of Birth:  1940/11/21       BSA:          2.066 m Patient Age:    83 years        BP:           155/80 mmHg Patient Gender: M               HR:           106 bpm. Exam Location:  Inpatient Procedure: 2D Echo, Cardiac Doppler and Color Doppler (Both Spectral and Color            Flow Doppler were utilized during procedure). Indications:    Stroke  History:        Patient has prior history of Echocardiogram examinations, most                 recent 04/09/2024. Arrythmias:Atrial Fibrillation; Risk                 Factors:Dyslipidemia and Hypertension.  Sonographer:    Sherlean Dubin Referring Phys: [id number] [facility] POKHREL IMPRESSIONS  1. Left ventricular ejection fraction, by estimation, is 60 to 65%. The left ventricle has normal function. Left ventricular endocardial border not optimally defined to evaluate regional wall motion. Left ventricular diastolic function could not be evaluated.  2. Right ventricular systolic function is normal. The right ventricular size is normal.  3. The mitral valve is normal in structure. No evidence of mitral valve regurgitation. No evidence of mitral stenosis.  4. The aortic valve is normal in structure. Aortic valve regurgitation is not visualized. No aortic stenosis is present. Comparison(s): No significant change from prior study. FINDINGS  Left Ventricle: Left ventricular ejection fraction, by estimation, is 60 to 65%. The left ventricle has normal function. Left ventricular endocardial border not optimally defined to evaluate regional wall motion. The left  ventricular internal cavity size was normal in size. Suboptimal image quality limits for assessment of left ventricular hypertrophy. Left ventricular diastolic function could not be evaluated. Right Ventricle: The right ventricular size is normal. No  increase in right ventricular wall thickness. Right ventricular systolic function is normal. Left Atrium: Left atrial size was not well visualized. Right Atrium: Right atrial size was not well visualized. Pericardium: The pericardium was not well visualized. Mitral Valve: The mitral valve is normal in structure. No evidence of mitral valve regurgitation. No evidence of mitral valve stenosis. Tricuspid Valve: The tricuspid valve is not well visualized. Tricuspid valve regurgitation is not demonstrated. No evidence of tricuspid stenosis. Aortic Valve: The aortic valve is normal in structure. Aortic valve regurgitation is not visualized. No aortic stenosis is present. Aortic valve peak gradient measures 4.6 mmHg. Pulmonic Valve: The pulmonic valve was not well visualized. Pulmonic valve regurgitation is not visualized. Aorta: The aortic root is normal in size and structure. Venous: The inferior vena cava was not well visualized. IAS/Shunts: The interatrial septum was not well visualized.  LEFT VENTRICLE PLAX 2D LVIDd:         4.30 cm   Diastology LVIDs:         2.80 cm   LV e' medial:  8.81 cm/s LV PW:         0.90 cm   LV e' lateral: 14.80 cm/s LV IVS:        0.90 cm LVOT diam:     2.00 cm LV SV:         41 LV SV Index:   20 LVOT Area:     3.14 cm  RIGHT VENTRICLE RV Basal diam:  3.90 cm    PULMONARY VEINS RV Mid diam:    3.20 cm    Diastolic Velocity: 17.70 cm/s RV S prime:     7.07 cm/s  S/D Velocity:       2.20 TAPSE (M-mode): 2.0 cm     Systolic Velocity:  39.00 cm/s LEFT ATRIUM             Index        RIGHT ATRIUM           Index LA diam:        4.30 cm 2.08 cm/m   RA Area:     22.80 cm LA Vol (A2C):   78.8 ml 38.14 ml/m  RA Volume:   58.40 ml  28.27 ml/m LA Vol (A4C):   53.9 ml 26.09 ml/m LA Biplane Vol: 65.3 ml 31.61 ml/m  AORTIC VALVE AV Area (Vmax): 2.31 cm AV Vmax:        107.00 cm/s AV Peak Grad:   4.6 mmHg LVOT Vmax:      78.80 cm/s LVOT Vmean:     55.200 cm/s LVOT VTI:       0.131 m  AORTA Ao  Root diam: 3.20 cm Ao Asc diam:  2.90 cm MITRAL VALVE             TRICUSPID VALVE MV Area (PHT): 4.41 cm  TR Peak grad:   11.6 mmHg MV Decel Time: 172 msec  TR Vmax:        170.00 cm/s                           SHUNTS  Systemic VTI:  0.13 m                          Systemic Diam: 2.00 cm Joelle Cedars Tonleu Electronically signed by Joelle Cedars Tonleu Signature Date/Time: 06/16/2024/4:30:45 PM    Final    CT ANGIO HEAD NECK W WO CM Result Date: 06/15/2024 EXAM: CTA Head and Neck with Intravenous Contrast. CT Head without Contrast. CLINICAL HISTORY: Stroke/TIA, determine embolic source. TECHNIQUE: Axial CTA images of the head and neck performed with intravenous contrast. MIP reconstructed images were created and reviewed. Axial computed tomography images of the head/brain performed without intravenous contrast. Note: Per PQRS, the description of internal carotid artery percent stenosis, including 0 percent or normal exam, is based on [employer] American Symptomatic Carotid Endarterectomy Trial (NASCET) criteria. Dose reduction technique was used including one or more of the following: automated exposure control, adjustment of mA and kV according to patient size, and/or iterative reconstruction. CONTRAST: With; 75 mL iohexol  (OMNIPAQUE ) 350 MG/ML injection. COMPARISON: Brain MRI 06/15/2024 redemonstration of multiple posterior circulation infarcts as seen earlier brain MRI. FINDINGS: CT HEAD: BRAIN: Redemonstration of multiple posterior circulation infarcts as seen earlier brain MRI. No acute intraparenchymal hemorrhage. No mass lesion. No CT evidence for acute territorial infarct. No midline shift or extra-axial collection. VENTRICLES: No hydrocephalus. ORBITS: The orbits are unremarkable. SINUSES AND MASTOIDS: The paranasal sinuses and mastoid air cells are clear. CTA NECK: COMMON CAROTID ARTERIES: Bilateral carotid bifurcation atherosclerotic calcification without hemodynamically significant  stenosis by NASCET criteria. No dissection or occlusion. INTERNAL CAROTID ARTERIES: The internal carotid arteries are widely patent to the skull base. No stenosis by NASCET criteria. No dissection or occlusion. 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 The distal V2 and V3 segments of the right vertebral artery are patent. There is mild atherosclerotic calcification along the V2, V3, and V4 segments of the left vertebral artery. Mild atherosclerosis of the right vertebral artery V4 segment. No dissection. CTA HEAD: ANTERIOR CEREBRAL ARTERIES: No significant stenosis. No occlusion. No aneurysm. MIDDLE CEREBRAL ARTERIES: No significant stenosis. No occlusion. No aneurysm. POSTERIOR CEREBRAL ARTERIES: No significant stenosis. No occlusion. No aneurysm. BASILAR ARTERY: No significant stenosis. No occlusion. No aneurysm. OTHER: There is mild atherosclerotic calcification of the carotid siphons. There is no proximal PICA occlusion. AORTIC ARCH: Atherosclerosis of the aortic arch. SOFT TISSUES: No acute finding. No masses or lymphadenopathy. BONES: No acute osseous abnormality. IMPRESSION: 1. No emergent intracranial large vessel occlusion 2. Redemonstration of multiple posterior circulation infarcts. 3. 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 4. Atherosclerosis of the aortic arch and bilateral carotid bifurcation without hemodynamically significant stenosis by NASCET criteria. Mild atherosclerotic calcification of the carotid siphons. Electronically signed by: Franky Stanford MD 06/15/2024 07:38 PM EST RP Workstation: [id number]   MR BRAIN WO CONTRAST Result Date: 06/15/2024 EXAM: MRI BRAIN WITHOUT CONTRAST 06/15/2024 04:29:47 PM TECHNIQUE: Multiplanar multisequence MRI of the head/brain was performed without the administration of intravenous contrast. COMPARISON: Head CT 06/15/2024 and MRI 10/30/2005.  CLINICAL HISTORY: Transient ischemic attack (TIA); acute vertigo, hx of Afib, not anticoagulated. FINDINGS: BRAIN AND VENTRICLES: There is a large acute to early subacute right cerebellar infarct in the PICA territory with cytotoxic edema partially effacing the fourth ventricle without evidence of obstructive hydrocephalus. Two adjacent subcentimeter acute to early subacute infarcts are also present in the left temporo-occipital region. There is a large subacute right  PCA infarct involving the posterior temporal and occipital lobes which is older than the aforementioned infarcts and has associated petechial hemorrhage. There is mild mass effect on the temporal horn of the right lateral ventricle. There are also older, small subacute infarcts in the left cerebellar hemisphere with associated petechial hemorrhage. Scattered small T2 hyperintensities elsewhere in the cerebral white matter bilaterally are nonspecific but compatible with mild chronic small vessel ischemic disease. There is mild cerebral atrophy. No mass, midline shift, or extra-axial fluid collection is identified. Major intracranial vascular flow voids are preserved. ORBITS: Bilateral cataract extraction. SINUSES AND MASTOIDS: Minimal mucosal thickening in the paranasal sinuses. Trace bilateral mastoid fluid. BONES AND SOFT TISSUES: Normal marrow signal. No acute soft tissue abnormality. IMPRESSION: 1. Multiple posterior circulation infarcts of varying ages. 2. Large acute to early subacute right cerebellar infarct and small acute to early subacute infarcts in the left temporo-occipital region. 3. Large, older subacute right PCA infarct. 4. Small late subacute infarcts in the left cerebellum. Electronically signed by: Dasie Hamburg MD 06/15/2024 05:24 PM EST RP Workstation: [id number]   CT Head Wo Contrast Result Date: 06/15/2024 EXAM: CT HEAD WITHOUT CONTRAST 06/15/2024 01:06:00 PM TECHNIQUE: CT of the head was performed without the administration of  intravenous contrast. Automated exposure control, iterative reconstruction, and/or weight based adjustment of the mA/kV was utilized to reduce the radiation dose to as low as reasonably achievable. COMPARISON: None available. CLINICAL HISTORY: Vertigo, central. FINDINGS: BRAIN AND VENTRICLES: Low density noted in the posterior medial right temporal lobe, right occipital lobe, and right cerebellum as well as potentially within the brainstem compatible with acute to subacute infarcts. Old infarcts in the left cerebellum. No acute hemorrhage. No hydrocephalus. No extra-axial collection. No mass effect or midline shift. ORBITS: No acute abnormality. SINUSES: No acute abnormality. SOFT TISSUES AND SKULL: No acute soft tissue abnormality. No skull fracture. IMPRESSION: 1. Acute to subacute infarcts in the posterior medial right temporal lobe, right occipital lobe, right cerebellum, and potentially within the brainstem. Recommend MRI for further evaluation. Electronically signed by: Franky Crease MD 06/15/2024 01:52 PM EST RP Workstation: [id number]    Scheduled Meds:  aspirin  EC  81 mg Oral Daily   atorvastatin   20 mg Oral Daily   clopidogrel  75 mg Oral Daily   enoxaparin  (LOVENOX ) injection  40 mg Subcutaneous Q24H   Continuous Infusions:     LOS: 2 days   Fredia Skeeter, MD [facility]  06/17/2024, 7:53 AM   *Please note that this is a verbal dictation therefore any spelling or grammatical errors are due to the Dragon Medical One system interpretation.  Please page via Amion and do not message via secure chat for urgent patient care matters. Secure chat can be used for non urgent patient care matters.  How to contact the [facility] Attending or Consulting provider 7A - 7P or covering provider during after hours 7P -7A, for this patient?  Check the care team in [facility] and look for a) attending/consulting [facility] provider listed and b) the [facility] team listed. Page or secure chat 7A-7P. Log into [email or web address]  and use Export's universal password to access. If you do not have the password, please contact the hospital operator. Locate the [facility] provider you are looking for under [facility] and page to a number that you can be directly reached. If you still have difficulty reaching the provider, please page the [facility] (Director on Call) for the Hospitalists listed on amion for assistance.

## 2024-06-17 NOTE — Plan of Care (Signed)
   Problem: Education: Goal: Knowledge of disease or condition will improve Outcome: Progressing Goal: Knowledge of secondary prevention will improve (MUST DOCUMENT ALL) Outcome: Progressing Goal: Knowledge of patient specific risk factors will improve (DELETE if not current risk factor) Outcome: Progressing

## 2024-06-18 ENCOUNTER — Inpatient Hospital Stay (HOSPITAL_COMMUNITY)

## 2024-06-18 DIAGNOSIS — I639 Cerebral infarction, unspecified: Secondary | ICD-10-CM | POA: Diagnosis not present

## 2024-06-18 LAB — BASIC METABOLIC PANEL WITH GFR
Anion gap: 8 (ref 5–15)
BUN: 25 mg/dL — ABNORMAL HIGH (ref 8–23)
CO2: 27 mmol/L (ref 22–32)
Calcium: 8.8 mg/dL — ABNORMAL LOW (ref 8.9–10.3)
Chloride: 104 mmol/L (ref 98–111)
Creatinine, Ser: 1.49 mg/dL — ABNORMAL HIGH (ref 0.61–1.24)
GFR, Estimated: 46 mL/min — ABNORMAL LOW (ref >60)
Glucose, Bld: 115 mg/dL — ABNORMAL HIGH (ref 70–99)
Potassium: 4.1 mmol/L (ref 3.5–5.1)
Sodium: 139 mmol/L (ref 135–145)

## 2024-06-18 MED ORDER — BENAZEPRIL HCL 20 MG PO TABS
20.0000 mg | ORAL_TABLET | Freq: Two times a day (BID) | ORAL | Status: DC
  Administered 2024-06-18 – 2024-06-21 (×7): 20 mg via ORAL
  Filled 2024-06-18 (×8): qty 1

## 2024-06-18 MED ORDER — APIXABAN 2.5 MG PO TABS
2.5000 mg | ORAL_TABLET | Freq: Two times a day (BID) | ORAL | Status: DC
  Administered 2024-06-18 – 2024-06-21 (×6): 2.5 mg via ORAL
  Filled 2024-06-18 (×6): qty 1

## 2024-06-18 NOTE — Progress Notes (Signed)
 PROGRESS NOTE    Dustin Ashley  FMW:[id number] DOB: Sep 14, 1940 DOA: 06/15/2024 PCP: Amon Aloysius BRAVO, MD   Brief Narrative:  HPI:  Patient is a 83 year old male with past medical history of atrial fibrillation not on anticoagulation, history of hypertension, hyperlipidemia, history of coronary artery disease status post CABG and renal artery stenosis presented to hospital with sudden onset of dizziness around 11:30 PM yesterday night.  It was also accompanied by nausea and multiple episodes of vomiting with vertigo.  Vertigo worse with movement of the head and position.  Patient denied any fever, chills or rigor.  Denied any headache, shortness of breath, chest pain or dyspnea.  Denied any focal weakness.  He then had a fall.  Patient denied any urinary urgency, frequency or dysuria.  Patient was then brought into the hospital.  In the ED, blood pressure was slightly elevated.  Labs were notable for creatinine elevation at 1.3.  Lactate of 1.9.  WBC elevated at 17.8.  CK level was 109.  BNP of 242.  CT head scan showed acute infarct in the posterior middle right temporal lobe occipital lobe and right cerebellum.  Code stroke was called in and neurology saw the patient.  Patient was then considered for admission to hospital for further evaluation and treatment   Assessment & Plan:   Principal Problem:   Acute stroke due to ischemia [facility]) Active Problems:   Hyperlipidemia   Essential hypertension   Paroxysmal A-fib (HCC)  Vertigo secondary to acute possible embolic stroke/essential hypertension/hyperlipidemia. MRI of the brain showed multiple posterior circulation infarct of varying ages with a large acute to early subacute right cerebellar infarct and acute to early subacute infarct in the left temporal parietal region and large subacute right PCA infarct.  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  Echo shows normal ejection fraction and no PFO.   LDL only 60 but HDL 38, patient has been started on atorvastatin .  Remains on aspirin  and Plavix  and neurology recommends to continue this for next 1 to 2 days and stable repeat CT head and based on the results, might start on Eliquis  with aspirin  but no Plavix .  However repeat CT scan appears to be showing worsening hemorrhagic conversion.  Awaiting neurology to provide recommendations. Seen by PT OT, CIR recommended.  He is pending insurance authorization for admission to CIR.  Out of window to allow permissive hypertension.  Resumed amlodipine  and atenolol  yesterday, blood pressure elevated, now will resume Lotensin  today.  Blood pressure 178/80 prior to getting morning antihypertensives.  Atrial fibrillation not on anticoagulation.  Will continue telemetry monitoring.  Currently on aspirin  and Plavix .  Does not take anticoagulation per his own decision despite of understanding that he was at risk of a stroke for not taking blood thinners.  Patient is willing to take Eliquis  now.  Awaiting neurology recommendations as mentioned above.   History of insomnia.  On diazepam .  Will continue   History of CAD status post CABG, renal artery stenosis.  Continue aspirin  and statins   CKD stage IIIb: Baseline creatinine appears to be around 1.5.  Currently at baseline.  DVT prophylaxis: enoxaparin  (LOVENOX ) injection 40 mg Start: 06/15/24 1800   Code Status: Full Code  Family Communication: Son present at bedside.  Plan of care discussed with patient in length and he/she verbalized understanding and agreed with it.   Status is: Inpatient Remains  inpatient appropriate because: Pending placement to CIR   Estimated body mass index is 29.53 kg/m as calculated from the following:   Height as of this encounter: 5' 9 (1.753 m).   Weight as of this encounter: 90.7 kg.    Nutritional Assessment: Body mass index is 29.53 kg/m.[name] Seen by dietician.   I agree with the assessment and plan as outlined below: Nutrition Status:        . Skin Assessment: I have examined the patient's skin and I agree with the wound assessment as performed by the wound care RN as outlined below:    Consultants:  Neurology  Procedures:  None  Antimicrobials:  Anti-infectives (From admission, onward)    None         Subjective: Seen and examined, still complains of the left-sided visual deficit.  Son at the bedside.  Patient has no other complaint.  Objective: Vitals:   06/17/24 2339 06/18/24 0335 06/18/24 0845 06/18/24 1056  BP: (!) 167/83 (!) 166/61 (!) 164/79 (!) 178/80  Pulse: 72 87 74 81  Resp: 16 17 18 17   Temp: 98.8 F (37.1 C) 98.5 F (36.9 C) 97.9 F (36.6 C)   TempSrc: Oral  Oral   SpO2: 95% 95% 94% 96%  Weight:      Height:       No intake or output data in the 24 hours ending 06/18/24 1121  Filed Weights   06/15/24 1114  Weight: 90.7 kg    Examination:  General exam: Appears calm and comfortable  Respiratory system: Clear to auscultation. Respiratory effort normal. Cardiovascular system: S1 & S2 heard, RRR. No JVD, murmurs, rubs, gallops or clicks. No pedal edema. Gastrointestinal system: Abdomen is nondistended, soft and nontender. No organomegaly or masses felt. Normal bowel sounds heard. Central nervous system: Alert and oriented. No focal neurological deficits. Extremities: Symmetric 5 x 5 power. Skin: No rashes, lesions or ulcers.  Psychiatry: Judgement and insight appear normal. Mood & affect appropriate.   Data Reviewed: I have personally reviewed following labs and imaging studies  CBC: Recent Labs  Lab 06/15/24 1152 06/15/24 1202 06/15/24 1935 06/16/24 0137 06/17/24 0248  WBC 17.8*  --  16.2* 15.9* 11.4*  NEUTROABS 14.6*  --   --   --  7.6  HGB 14.8 15.3 14.6 14.3 13.4  HCT 44.0 45.0 42.5 42.3 39.8  MCV 86.4  --  84.8 85.6 85.8  PLT 134*  --  155 153 160   Basic Metabolic Panel: Recent  Labs  Lab 06/15/24 1152 06/15/24 1202 06/15/24 1935 06/16/24 0137 06/17/24 0248 06/18/24 0817  NA 138 141  --  140 140 139  K 4.5 4.1  --  4.2 3.8 4.1  CL 104 104  --  102 107 104  CO2 21*  --   --  24 27 27   GLUCOSE 127* 129*  --  117* 110* 115*  BUN 37* 36*  --  27* 28* 25*  CREATININE 1.29* 1.30* 1.26* 1.27* 1.50* 1.49*  CALCIUM  9.2  --   --  9.0 8.5* 8.8*  MG  --   --   --  1.8  --   --    GFR: Estimated Creatinine Clearance: 41.8 mL/min (A) (by C-G formula based on SCr of 1.49 mg/dL (H)). Liver Function Tests: Recent Labs  Lab 06/15/24 1152  AST 25  ALT 19  ALKPHOS 84  BILITOT 1.0  PROT 6.6  ALBUMIN 3.6   No results for input(s): LIPASE, AMYLASE in the  last 168 hours. No results for input(s): AMMONIA in the last 168 hours. Coagulation Profile: Recent Labs  Lab 06/15/24 1152  INR 1.0   Cardiac Enzymes: Recent Labs  Lab 06/15/24 1152  CKTOTAL 109   BNP (last 3 results) No results for input(s): PROBNP in the last 8760 hours. HbA1C: Recent Labs    06/16/24 0137  HGBA1C 5.8*   CBG: No results for input(s): GLUCAP in the last 168 hours. Lipid Profile: Recent Labs    06/16/24 0137  CHOL 117  HDL 38*  LDLCALC 60  TRIG 93  CHOLHDL 3.1   Thyroid  Function Tests: No results for input(s): TSH, T4TOTAL, FREET4, T3FREE, THYROIDAB in the last 72 hours. Anemia Panel: No results for input(s): VITAMINB12, FOLATE, FERRITIN, TIBC, IRON, RETICCTPCT in the last 72 hours. Sepsis Labs: Recent Labs  Lab 06/15/24 1202  LATICACIDVEN 1.9    No results found for this or any previous visit (from the past 240 hours).   Radiology Studies: CT HEAD WO CONTRAST ( ) Result Date: 06/18/2024 EXAM: CT HEAD WITHOUT CONTRAST 06/18/2024 10:29:37 AM TECHNIQUE: CT of the head was performed without the administration of intravenous contrast. Automated exposure control, iterative reconstruction, and/or weight based adjustment of the mA/kV was  utilized to reduce the radiation dose to as low as reasonably achievable. COMPARISON: Head CT, CTA, and MRI 06/15/2024. CLINICAL HISTORY: Stroke, follow up. FINDINGS: BRAIN AND VENTRICLES: Large subacute right cerebellar and right PCA infarcts are again seen. Cerebellar edema has mildly worsened with mildly increased effacement of the 4th ventricle, and the lateral and third ventricles are slightly larger than before. There is petechial hemorrhage associated with both the cerebellar and PCA infarcts. Smaller recent left cerebellar infarcts are unchanged. No new infarct or malignant hemorrhagic transformation is identified. There is no midline shift or extra-axial fluid collection. A background of mild chronic small vessel ischemia in the cerebral white matter and mild cerebral atrophy is noted. Calcified atherosclerosis at the skull base. ORBITS: Bilateral cataract extraction. SINUSES: Minimal mucosal thickening in the paranasal sinuses. No significant mastoid fluid. SOFT TISSUES AND SKULL: No acute soft tissue abnormality. No skull fracture. IMPRESSION: 1. Mildly worsening edema associated with the large subacute right cerebellar infarct with petechial hemorrhage. Mildly increased effacement of the 4th ventricle with slight interval dilatation of the lateral and third ventricles which may reflect early hydrocephalus. 2. Unchanged large subacute right PCA and small left cerebellar infarcts. Electronically signed by: Dasie Hamburg MD 06/18/2024 10:58 AM EST RP Workstation: [id number]   ECHOCARDIOGRAM COMPLETE Result Date: 06/16/2024    ECHOCARDIOGRAM REPORT   Patient Name:   NAZEER ROMNEY [facility] Date of Exam: 06/16/2024 Medical Rec #:  [id number]       Height:       69.0 in Accession #:    [id number]      Weight:       200.0 lb Date of Birth:  1940-12-07       BSA:          2.066 m Patient Age:    83 years        BP:           155/80 mmHg Patient Gender: M               HR:           106 bpm. Exam Location:  Inpatient  Procedure: 2D Echo, Cardiac Doppler and Color Doppler (Both Spectral and Color  Flow Doppler were utilized during procedure). Indications:    Stroke  History:        Patient has prior history of Echocardiogram examinations, most                 recent 04/09/2024. Arrythmias:Atrial Fibrillation; Risk                 Factors:Dyslipidemia and Hypertension.  Sonographer:    Sherlean Dubin Referring Phys: [id number] [facility] POKHREL IMPRESSIONS  1. Left ventricular ejection fraction, by estimation, is 60 to 65%. The left ventricle has normal function. Left ventricular endocardial border not optimally defined to evaluate regional wall motion. Left ventricular diastolic function could not be evaluated.  2. Right ventricular systolic function is normal. The right ventricular size is normal.  3. The mitral valve is normal in structure. No evidence of mitral valve regurgitation. No evidence of mitral stenosis.  4. The aortic valve is normal in structure. Aortic valve regurgitation is not visualized. No aortic stenosis is present. Comparison(s): No significant change from prior study. FINDINGS  Left Ventricle: Left ventricular ejection fraction, by estimation, is 60 to 65%. The left ventricle has normal function. Left ventricular endocardial border not optimally defined to evaluate regional wall motion. The left ventricular internal cavity size was normal in size. Suboptimal image quality limits for assessment of left ventricular hypertrophy. Left ventricular diastolic function could not be evaluated. Right Ventricle: The right ventricular size is normal. No increase in right ventricular wall thickness. Right ventricular systolic function is normal. Left Atrium: Left atrial size was not well visualized. Right Atrium: Right atrial size was not well visualized. Pericardium: The pericardium was not well visualized. Mitral Valve: The mitral valve is normal in structure. No evidence of mitral valve regurgitation. No evidence  of mitral valve stenosis. Tricuspid Valve: The tricuspid valve is not well visualized. Tricuspid valve regurgitation is not demonstrated. No evidence of tricuspid stenosis. Aortic Valve: The aortic valve is normal in structure. Aortic valve regurgitation is not visualized. No aortic stenosis is present. Aortic valve peak gradient measures 4.6 mmHg. Pulmonic Valve: The pulmonic valve was not well visualized. Pulmonic valve regurgitation is not visualized. Aorta: The aortic root is normal in size and structure. Venous: The inferior vena cava was not well visualized. IAS/Shunts: The interatrial septum was not well visualized.  LEFT VENTRICLE PLAX 2D LVIDd:         4.30 cm   Diastology LVIDs:         2.80 cm   LV e' medial:  8.81 cm/s LV PW:         0.90 cm   LV e' lateral: 14.80 cm/s LV IVS:        0.90 cm LVOT diam:     2.00 cm LV SV:         41 LV SV Index:   20 LVOT Area:     3.14 cm  RIGHT VENTRICLE RV Basal diam:  3.90 cm    PULMONARY VEINS RV Mid diam:    3.20 cm    Diastolic Velocity: 17.70 cm/s RV S prime:     7.07 cm/s  S/D Velocity:       2.20 TAPSE (M-mode): 2.0 cm     Systolic Velocity:  39.00 cm/s LEFT ATRIUM             Index        RIGHT ATRIUM           Index LA diam:  4.30 cm 2.08 cm/m   RA Area:     22.80 cm LA Vol (A2C):   78.8 ml 38.14 ml/m  RA Volume:   58.40 ml  28.27 ml/m LA Vol (A4C):   53.9 ml 26.09 ml/m LA Biplane Vol: 65.3 ml 31.61 ml/m  AORTIC VALVE AV Area (Vmax): 2.31 cm AV Vmax:        107.00 cm/s AV Peak Grad:   4.6 mmHg LVOT Vmax:      78.80 cm/s LVOT Vmean:     55.200 cm/s LVOT VTI:       0.131 m  AORTA Ao Root diam: 3.20 cm Ao Asc diam:  2.90 cm MITRAL VALVE             TRICUSPID VALVE MV Area (PHT): 4.41 cm  TR Peak grad:   11.6 mmHg MV Decel Time: 172 msec  TR Vmax:        170.00 cm/s                           SHUNTS                          Systemic VTI:  0.13 m                          Systemic Diam: 2.00 cm Franck Azobou Tonleu Electronically signed by Joelle Cedars Tonleu Signature Date/Time: 06/16/2024/4:30:45 PM    Final     Scheduled Meds:  amLODipine   5 mg Oral Daily   aspirin  EC  81 mg Oral Daily   atenolol   75 mg Oral Daily   atorvastatin   20 mg Oral Daily   benazepril   20 mg Oral BID   clopidogrel   75 mg Oral Daily   enoxaparin  (LOVENOX ) injection  40 mg Subcutaneous Q24H   Continuous Infusions:     LOS: 3 days   Fredia Skeeter, MD [facility]  06/18/2024, 11:21 AM   *Please note that this is a verbal dictation therefore any spelling or grammatical errors are due to the Dragon Medical One system interpretation.  Please page via Amion and do not message via secure chat for urgent patient care matters. Secure chat can be used for non urgent patient care matters.  How to contact the [facility] Attending or Consulting provider 7A - 7P or covering provider during after hours 7P -7A, for this patient?  Check the care team in [facility] and look for a) attending/consulting [facility] provider listed and b) the [facility] team listed. Page or secure chat 7A-7P. Log into [email or web address] and use [place]'s universal password to access. If you do not have the password, please contact the hospital operator. Locate the [facility] provider you are looking for under [facility] and page to a number that you can be directly reached. If you still have difficulty reaching the provider, please page the [facility] (Director on Call) for the Hospitalists listed on amion for assistance.

## 2024-06-18 NOTE — Plan of Care (Signed)
 Alert and oriented x4. OOB to chair. Updated on plan of care.  CIR pending insurance authorization.  Starting on oral anticoagulation tonight, patient and family updated.  Continue plan of care.   Problem: Education: Goal: Knowledge of disease or condition will improve Outcome: Progressing Goal: Knowledge of secondary prevention will improve (MUST DOCUMENT ALL) Outcome: Progressing Goal: Knowledge of patient specific risk factors will improve (DELETE if not current risk factor) Outcome: Progressing   Problem: Ischemic Stroke/TIA Tissue Perfusion: Goal: Complications of ischemic stroke/TIA will be minimized Outcome: Progressing   Problem: Coping: Goal: Will verbalize positive feelings about self Outcome: Progressing Goal: Will identify appropriate support needs Outcome: Progressing   Problem: Health Behavior/Discharge Planning: Goal: Ability to manage health-related needs will improve Outcome: Progressing Goal: Goals will be collaboratively established with patient/family Outcome: Progressing   Problem: Self-Care: Goal: Ability to participate in self-care as condition permits will improve Outcome: Progressing Goal: Verbalization of feelings and concerns over difficulty with self-care will improve Outcome: Progressing Goal: Ability to communicate needs accurately will improve Outcome: Progressing   Problem: Nutrition: Goal: Risk of aspiration will decrease Outcome: Progressing Goal: Dietary intake will improve Outcome: Progressing   Problem: Education: Goal: Knowledge of General Education information will improve Description: Including pain rating scale, medication(s)/side effects and non-pharmacologic comfort measures Outcome: Progressing   Problem: Health Behavior/Discharge Planning: Goal: Ability to manage health-related needs will improve Outcome: Progressing   Problem: Clinical Measurements: Goal: Ability to maintain clinical measurements within normal limits  will improve Outcome: Progressing Goal: Will remain free from infection Outcome: Progressing Goal: Diagnostic test results will improve Outcome: Progressing Goal: Respiratory complications will improve Outcome: Progressing Goal: Cardiovascular complication will be avoided Outcome: Progressing   Problem: Activity: Goal: Risk for activity intolerance will decrease Outcome: Progressing   Problem: Nutrition: Goal: Adequate nutrition will be maintained Outcome: Progressing   Problem: Coping: Goal: Level of anxiety will decrease Outcome: Progressing   Problem: Elimination: Goal: Will not experience complications related to bowel motility Outcome: Progressing Goal: Will not experience complications related to urinary retention Outcome: Progressing   Problem: Pain Managment: Goal: General experience of comfort will improve and/or be controlled Outcome: Progressing   Problem: Safety: Goal: Ability to remain free from injury will improve Outcome: Progressing   Problem: Skin Integrity: Goal: Risk for impaired skin integrity will decrease Outcome: Progressing

## 2024-06-18 NOTE — Progress Notes (Signed)
 Physical Therapy Treatment Patient Details Name: Dustin Ashley MRN: [id number] DOB: [birth year] Today's Date: 06/18/2024   History of Present Illness 83 y.o. male [facility]15[facility] with sudden onset of dizziness s/p fall. 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 PMHx: atrial fibrillation not on anticoagulation, history of hypertension, hyperlipidemia, history of coronary artery disease status post CABG and renal artery stenosis    PT Comments  Pt making excellent progress towards acute PT goals. When ambulating with 1HH, pt required ModA to correct increased postural sway and small losses of balance. With use of RW, pt required up to [facility] for steadying assist. Pt reported feeling dizzy when ambulating with focus on gaze stabilization. Pt performed seated VORx1 exercise for 30 seconds each with pt reporting 5/10 dizziness after horizontal nods and 6/10 after vertical nods. Educated provided on rep scheme with HEP given at end of session. Continue to recommend >3hrs post acute rehab to maximize rehab potential. Acute PT to follow.    If plan is discharge home, recommend the following: A lot of help with walking and/or transfers;A lot of help with bathing/dressing/bathroom;Assist for transportation;Help with stairs or ramp for entrance   Can travel by private vehicle      Yes  Equipment Recommendations  None recommended by PT       Precautions / Restrictions Precautions Precautions: Fall Recall of Precautions/Restrictions: Intact Precaution/Restrictions Comments: HoH Restrictions Weight Bearing Restrictions Per Provider Order: No     Mobility  Bed Mobility  General bed mobility comments: NT, received in recliner    Transfers Overall transfer level: Needs assistance Equipment used: Rolling walker (2 wheels) Transfers: Sit to/from Stand Sit to Stand: Min assist, Contact guard assist    General transfer  comment: MinA for slight steadying assist with raise. Cues for hand placement    Ambulation/Gait Ambulation/Gait assistance: Min assist, Mod assist, Contact guard assist Gait Distance (Feet): 80 Feet (x4) Assistive device: Rolling walker (2 wheels), 1 person hand held assist Gait Pattern/deviations: Step-through pattern, Decreased stride length, Drifts right/left, Trunk flexed Gait velocity: decr     General Gait Details: MinA/CGA when ambulating with use of RW with heavy reliance on UE support. Up to [facility] when ambulating with 1HH for unsteadiness and increased postural sway. Reported 6/10 dizziness when ambulating, cues for gaze stabilization  Modified Rankin (Stroke Patients Only) Modified Rankin (Stroke Patients Only) Pre-Morbid Rankin Score: No symptoms Modified Rankin: Moderately severe disability     Balance Overall balance assessment: Needs assistance Sitting-balance support: No upper extremity supported, Feet supported Sitting balance-Leahy Scale: Fair     Standing balance support: Bilateral upper extremity supported, During functional activity, Reliant on assistive device for balance Standing balance-Leahy Scale: Poor Standing balance comment: reliant on external assist and up to [facility]       Communication Communication Communication: Impaired Factors Affecting Communication: Hearing impaired  Cognition Arousal: Alert Behavior During Therapy: WFL for tasks assessed/performed   PT - Cognitive impairments: Problem solving    Following commands: Impaired Following commands impaired: Follows multi-step commands inconsistently, Only follows one step commands consistently    Cueing Cueing Techniques: Verbal cues, Tactile cues  Exercises Other Exercises Other Exercises: VORx1 with horizontal and vertical rotations, 1x30 sec. Reported 5/10 dizziness after horizontal turns and 6/10 after vertical turns      PT Goals (current goals can now be found in the care plan  section) Acute Rehab PT Goals PT Goal Formulation: With patient/family  Time For Goal Achievement: 06/30/24 Potential to Achieve Goals: Good Progress towards PT goals: Progressing toward goals    Frequency    Min 3X/week       AM-PAC PT 6 Clicks Mobility   Outcome Measure  Help needed turning from your back to your side while in a flat bed without using bedrails?: A Little Help needed moving from lying on your back to sitting on the side of a flat bed without using bedrails?: A Little Help needed moving to and from a bed to a chair (including a wheelchair)?: A Little Help needed standing up from a chair using your arms (e.g., wheelchair or bedside chair)?: A Little Help needed to walk in hospital room?: A Little Help needed climbing 3-5 steps with a railing? : A Lot 6 Click Score: 17    End of Session Equipment Utilized During Treatment: Gait belt Activity Tolerance: Patient tolerated treatment well Patient left: Other (comment);with family/visitor present (with OT in bathroom) Nurse Communication: Mobility status PT Visit Diagnosis: Unsteadiness on feet (R26.81);Other abnormalities of gait and mobility (R26.89);Difficulty in walking, not elsewhere classified (R26.2)     Time: 8452-8382 PT Time Calculation (min) (ACUTE ONLY): 30 min  Charges:    $Gait Training: 8-22 mins $Therapeutic Exercise: 8-22 mins PT General Charges $$ ACUTE PT VISIT: 1 Visit                    Kate ORN, PT, DPT Secure Chat Preferred  Rehab Office [phone]    Kate BRAVO [name] 06/18/2024, 5:00 PM

## 2024-06-18 NOTE — Progress Notes (Signed)
 Transported off unit at this time for CT.  Transport method: bed Accompanied by: Patient Transporter

## 2024-06-18 NOTE — Care Management Important Message (Signed)
 Important Message  Patient Details  Name: Dustin Ashley MRN: [id number] Date of Birth: [birth year]   Important Message Given:  Yes - Medicare IM     Claretta Deed 06/18/2024, 3:05 PM

## 2024-06-18 NOTE — Progress Notes (Signed)
 Arrived back to unit after CT completed.  Transport method: Bed  Escorted by: Patient Transporter

## 2024-06-18 NOTE — Progress Notes (Signed)
 STROKE TEAM PROGRESS NOTE    SIGNIFICANT HOSPITAL EVENTS 06/15/2024 - Admitted  INTERIM HISTORY/SUBJECTIVE Patient lying comfortably in bed.  His son is at the bedside.  Continues to have left-sided partial vision loss.  No complaints today.   Repeat CT head today shows mild worsening of cytotoxic edema in the right cerebellum stroke with right atrium and fourth medical bed decreased  in hemorrhagic transformation in the subacute PCA infarct. OBJECTIVE  CBC    Component Value Date/Time   WBC 11.4 (H) 06/17/2024 0248   RBC 4.64 06/17/2024 0248   HGB 13.4 06/17/2024 0248   HCT 39.8 06/17/2024 0248   PLT 160 06/17/2024 0248   MCV 85.8 06/17/2024 0248   MCH 28.9 06/17/2024 0248   MCHC 33.7 06/17/2024 0248   RDW 13.3 06/17/2024 0248   LYMPHSABS 2.4 06/17/2024 0248   MONOABS 1.1 (H) 06/17/2024 0248   EOSABS 0.3 06/17/2024 0248   BASOSABS 0.1 06/17/2024 0248    BMET    Component Value Date/Time   NA 139 06/18/2024 0817   K 4.1 06/18/2024 0817   CL 104 06/18/2024 0817   CO2 27 06/18/2024 0817   GLUCOSE 115 (H) 06/18/2024 0817   GLUCOSE 125 (H) 07/17/2006 1002   BUN 25 (H) 06/18/2024 0817   CREATININE 1.49 (H) 06/18/2024 0817   CALCIUM  8.8 (L) 06/18/2024 0817   GFRNONAA 46 (L) 06/18/2024 0817    IMAGING past 24 hours CT HEAD WO CONTRAST ( ) Result Date: 06/18/2024 EXAM: CT HEAD WITHOUT CONTRAST 06/18/2024 10:29:37 AM TECHNIQUE: CT of the head was performed without the administration of intravenous contrast. Automated exposure control, iterative reconstruction, and/or weight based adjustment of the mA/kV was utilized to reduce the radiation dose to as low as reasonably achievable. COMPARISON: Head CT, CTA, and MRI 06/15/2024. CLINICAL HISTORY: Stroke, follow up. FINDINGS: BRAIN AND VENTRICLES: Large subacute right cerebellar and right PCA infarcts are again seen. Cerebellar edema has mildly worsened with mildly increased effacement of the 4th ventricle, and the lateral and third  ventricles are slightly larger than before. There is petechial hemorrhage associated with both the cerebellar and PCA infarcts. Smaller recent left cerebellar infarcts are unchanged. No new infarct or malignant hemorrhagic transformation is identified. There is no midline shift or extra-axial fluid collection. A background of mild chronic small vessel ischemia in the cerebral white matter and mild cerebral atrophy is noted. Calcified atherosclerosis at the skull base. ORBITS: Bilateral cataract extraction. SINUSES: Minimal mucosal thickening in the paranasal sinuses. No significant mastoid fluid. SOFT TISSUES AND SKULL: No acute soft tissue abnormality. No skull fracture. IMPRESSION: 1. Mildly worsening edema associated with the large subacute right cerebellar infarct with petechial hemorrhage. Mildly increased effacement of the 4th ventricle with slight interval dilatation of the lateral and third ventricles which may reflect early hydrocephalus. 2. Unchanged large subacute right PCA and small left cerebellar infarcts. Electronically signed by: Dasie Hamburg MD 06/18/2024 10:58 AM EST RP Workstation: [id number]    Vitals:   06/18/24 0845 06/18/24 1056 06/18/24 1146 06/18/24 1258  BP: (!) 164/79 (!) 178/80 (!) 154/67 (!) 156/82  Pulse: 74 81 75 87  Resp: 18 17 18 15   Temp: 97.9 F (36.6 C)  97.8 F (36.6 C) 98 F (36.7 C)  TempSrc: Oral  Oral Oral  SpO2: 94% 96% 95% 97%  Weight:      Height:         PHYSICAL EXAM General:  Alert, well-nourished, well-developed patient in no acute distress Psych:  Mood and affect  appropriate for situation CV: Regular rate and rhythm on monitor Respiratory:  Regular, unlabored respirations on room air GI: Abdomen soft and nontender   NEURO:  Mental Status: AA&Ox3 Speech/Language: speech is without dysarthria or aphasia.  Naming, repetition, fluency, and comprehension intact. Pt is hard of hearing per family.  Cranial Nerves:  II: PERRL. Visual fields  full, but may be compensating on the L visual field. Will continue to assess. III, IV, VI: EOMI. Eyelids elevate symmetrically.  V: Sensation is intact to light touch and symmetrical to face.  VII: Face is symmetrical resting and smiling VIII: hearing intact to loud voice. IX, X: Palate elevates symmetrically. Phonation is normal.  KP:Dynloizm shrug 5/5. XII: tongue is midline without fasciculations. Motor: 5/5 strength to all muscle groups tested.  Tone: is normal and bulk is normal Sensation- Intact to light touch bilaterally. Extinction absent to light touch to DSS.   Coordination: FTN intact bilaterally, HKS: no ataxia in BLE.No drift.  Gait- deferred      ASSESSMENT/PLAN  Dustin Ashley is a 83 y.o. male with history of atrial fibrillation (not compliant with Eliquis , complaint with ASA), HTN, HLD, CAD post CABG, and Renal Artery Stenosis.  Admitted for Ischemic stroke.   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  Repeat CT Head on 12/5 assessing for hemorrhage prior to restarting Eliquis  CTA head & neck: Occlusion of R vertebral artery.  MRI: Multiple posterior circulation infarcts of differing ages. Large acute to early subacute right cerebellar infarct and small acute to early subacute infarcts in the left temporo-occipital region. Large older subacute R PCA infarct. Small late subacute infarcts in the L cerebellum. 2D Echo EF 60 to 65%. LDL 60 HgbA1c 5.7 VTE prophylaxis - Lovenox  Anticoagulation: aspirin  81 mg daily prior to admission, now on clopidogrel  75 mg daily. Plan to repeat CT head in 2 days (12/5) for concern of hemorrhagic transformation, if  negative, will restart Eliquis  for plan of Plavix  + Eliquis  for 90 days, then just Eliquis  indefinitely.  Therapy recommendations:  Pending Disposition:  Pending Therapy recs  Hx of Stroke/TIA Not previously recognized, but imaging suggests multiple prior infarcts.  Atrial fibrillation Home Meds: Eliquis  (was not taking) ASA (was taking) Continue telemetry monitoring Begin anticoagulation with Plavix  for now (see above)  Hypertension Home meds:  Norvasc  5mg , Benazepril  40mg , Atenolol  50mg  Stable Blood Pressure Goal: SBP less than 160   Hyperlipidemia Home meds:  Lipitor 20mg , resumed in hospital LDL 60, goal < 70 Pt at goal, no change recommended Continue statin at discharge  Diabetes  Home meds:  none HgbA1c 5.7, goal < 7.0  Substance Abuse UDS pending  Dysphagia Patient has post-stroke dysphagia, SLP consulted    Diet   DIET DYS 3 Room service appropriate? Yes; Fluid consistency: Thin  Advance diet as tolerated  Other Stroke Risk Factors ETOH use, alcohol level <15 Obesity, Body mass index is 29.53 kg/m., BMI >/= 30 associated with increased stroke risk, recommend weight loss, diet and exercise as appropriate  Coronary artery disease  Other Active Problems   Hospital day # 3   Patient with atrial fibrillation not compliant with Eliquis  presented with dizziness and nausea vomiting secondary to right cerebellar as well as medial temporal occipital  infarcts.  MRI also shows older age left cerebellar and right occipital infarcts.  Stroke etiology likely proximal right vertebral artery stenosis/occlusion with distal embolization.  Patient also has atrial fibrillation and has not been compliant with Eliquis .  Recommend   aspirin   and Eliquis  for stroke prevention now and Plavix .  Mobilize out of bed.  Therapy consults.  Patient advised not to drive due to peripheral vision loss improved.  Long discussion with patient and his son at the bedside and answered  questions. Stroke team will sign off.  Kindly call for questions.  Follow-up as an outpatient stroke clinic in 2 months   Eather Popp, MD Medical Director [facility] Pager: [phone] 06/18/2024 2:28 PM   To contact Stroke Continuity provider, please refer to [email or web address]. After hours, contact General Neurology

## 2024-06-18 NOTE — Progress Notes (Signed)
 Inpatient Rehab Admissions Coordinator:    CIR following, await insurance auth.  Leita Kleine, MS, CCC-SLP Rehab Admissions Coordinator  [phone] (celll) [phone] (office)

## 2024-06-18 NOTE — Progress Notes (Signed)
 Occupational Therapy Treatment Patient Details Name: Dustin Ashley MRN: [id number] DOB: 29-May-1941 Today's Date: 06/18/2024   History of present illness 83 y.o. male presents to [facility]/15/24 with sudden onset of dizziness s/p fall. 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 PMHx: atrial fibrillation not on anticoagulation, history of hypertension, hyperlipidemia, history of coronary artery disease status post CABG and renal artery stenosis   OT comments  Pt seen this PM for OT treatment. Handoff from PT at beginning of session. Supportive son present, happy with pt's progress. Pt with improved functional performance today, generally min A for UB/LB ADLs and mobility with RW. Pt does demonstrate incr truncal sway during dynamic IADL task (laundry folding), requiring min A and external UE support via sink vanity for stability. Educated pt on figure-4 method for LB dressing/bathing to reduce incidence of dizziness. Demonstrated fair teachback of education. AMPAC 18/24, indicating improved functional performance, OT to continue to follow.      If plan is discharge home, recommend the following:  A little help with walking and/or transfers;A little help with bathing/dressing/bathroom;Assistance with cooking/housework;Direct supervision/assist for medications management;Direct supervision/assist for financial management;Assist for transportation;Supervision due to cognitive status   Equipment Recommendations  Other (comment) (defer to next level of care)    Recommendations for Other Services      Precautions / Restrictions Precautions Precautions: Fall Recall of Precautions/Restrictions: Intact Precaution/Restrictions Comments: HoH Restrictions Weight Bearing Restrictions Per Provider Order: No       Mobility Bed Mobility               General bed mobility comments: not assessed - pt received with PT on arrival and  left in chair upon OT exit    Transfers Overall transfer level: Needs assistance Equipment used: Rolling walker (2 wheels) Transfers: Bed to chair/wheelchair/BSC       Step pivot transfers: Min assist     General transfer comment: Min A for reduced coordinated movements when changing directions,  approaching surfaces and navigating obstacles.     Balance Overall balance assessment: Needs assistance Sitting-balance support: No upper extremity supported, Feet supported Sitting balance-Leahy Scale: Fair Sitting balance - Comments: sitting unsupported from back rest in chair   Standing balance support: Bilateral upper extremity supported, No upper extremity supported, During functional activity, Reliant on assistive device for balance Standing balance-Leahy Scale: Poor Standing balance comment: reliant on RW, needs min A for stability with dynamic standing tasks                           ADL either performed or assessed with clinical judgement   ADL Overall ADL's : Needs assistance/impaired     Grooming: Contact guard assist;Standing;Wash/dry hands Grooming Details (indicate cue type and reason): pt improperly judging power needed for water pressure, cued to locate paper towel dispenser             Lower Body Dressing: Minimal assistance;Sitting/lateral leans Lower Body Dressing Details (indicate cue type and reason): cued for figure-4 technique to prevent further dizziness, pt with incr effort to don socks d/t reduced flexibility Toilet Transfer: Minimal assistance;Ambulation;Regular Toilet;Rolling walker (2 wheels) Toilet Transfer Details (indicate cue type and reason): VC for safe approach and reaching for GBs to assist Toileting- Clothing Manipulation and Hygiene: Supervision/safety;Sitting/lateral lean Toileting - Clothing Manipulation Details (indicate cue type and reason): anterior peri hygiene seated on toilet     Functional mobility during  ADLs: Minimal  assistance;Contact guard assist;Rolling walker (2 wheels) General ADL Comments: Pt engaged in IADL task of folding laundry at the sink. Encouraged to focus on one washcloth at a time, challenging dynamic balance with frequent truncal sway, occasionally needing UE support from vanity and min A to stabilize self.    Extremity/Trunk Assessment              Occupational Psychologist Communication: Impaired Factors Affecting Communication: Hearing impaired   Cognition Arousal: Alert Behavior During Therapy: WFL for tasks assessed/performed Cognition: No apparent impairments             OT - Cognition Comments: very pleasant & engaged with OT                 Following commands: Impaired Following commands impaired: Follows multi-step commands inconsistently, Only follows one step commands consistently (suspect limited 2/2 hearing impairment)      Cueing   Cueing Techniques: Verbal cues, Tactile cues  Exercises      Shoulder Instructions       General Comments supportive son present throughout duration of session    Pertinent Vitals/ Pain       Pain Assessment Pain Assessment: No/denies pain  Home Living                                          Prior Functioning/Environment              Frequency  Min 2X/week        Progress Toward Goals  OT Goals(current goals can now be found in the care plan section)  Progress towards OT goals: Progressing toward goals     Plan      Co-evaluation                 AM-PAC OT 6 Clicks Daily Activity     Outcome Measure   Help from another person eating meals?: None Help from another person taking care of personal grooming?: A Little Help from another person toileting, which includes using toliet, bedpan, or urinal?: A Little Help from another person bathing (including washing, rinsing, drying)?: A Lot Help from another person to put  on and taking off regular upper body clothing?: A Little Help from another person to put on and taking off regular lower body clothing?: A Little 6 Click Score: 18    End of Session Equipment Utilized During Treatment: Gait belt;Rolling walker (2 wheels)  OT Visit Diagnosis: Unsteadiness on feet (R26.81);Other abnormalities of gait and mobility (R26.89);Dizziness and giddiness (R42)   Activity Tolerance Patient tolerated treatment well   Patient Left in chair;with call bell/phone within reach;with chair alarm set;with family/visitor present   Nurse Communication Mobility status;Other (comment)        Time: 8383-8367 OT Time Calculation (min): 16 min  Charges: OT General Charges $OT Visit: 1 Visit OT Treatments $Self Care/Home Management : 8-22 mins  Ivette Castronova M. [place], OTR/L [facility] Rehabilitation Services [phone] Secure Chat Preferred  Azhar Knope 06/18/2024, 5:24 PM

## 2024-06-18 NOTE — Progress Notes (Signed)
   06/18/24 1930  Hand-off documentation  Hand-off Given Given to shift RN/LPN  Report given to (Full Name) Onpaneeya P. RN   Family asked about PRN for bowel movement, notified that there is a PRN order at bedtime for senna.  Passed on to oncoming RN to administer at allotted time.

## 2024-06-18 NOTE — Care Management (Signed)
 Transition of Care [facility]) - Inpatient Brief Assessment   Patient Details  Name: Dustin Ashley MRN: [id number] Date of Birth: 09-04-1940  [facility] CM/SW Contact:    Corean JAYSON Canary, RN Phone Number: 06/18/2024, 9:09 AM   Clinical Narrative: 83 year old presented with stroke symptoms. The patient lives alone and has family nearby.  CIR has evaluated and will likely accept His family has stated that they have ability to 24/7 care post CIR.   IPCM will follow for additional needs  Transition of Care Asessment: Insurance and Status: Insurance coverage has been reviewed Patient has primary care physician: Yes Home environment has been reviewed: Lives alone Prior level of function:: Independent Prior/Current Home Services: No current home services Social Drivers of Health Review: SDOH reviewed no interventions necessary Readmission risk has been reviewed: Yes Transition of care needs: transition of care needs identified, TOC will continue to follow

## 2024-06-18 NOTE — Plan of Care (Signed)
  Problem: Education: Goal: Knowledge of disease or condition will improve 06/18/2024 0624 by Shawonda Kerce K, RN Outcome: Progressing 06/17/2024 2010 by Maryln Eastham K, RN Outcome: Progressing Goal: Knowledge of secondary prevention will improve (MUST DOCUMENT ALL) 06/18/2024 0624 by Keymora Grillot K, RN Outcome: Progressing 06/17/2024 2010 by Promyse Ardito K, RN Outcome: Progressing Goal: Knowledge of patient specific risk factors will improve (DELETE if not current risk factor) 06/18/2024 0624 by Merilyn Merlynn POUR, RN Outcome: Progressing 06/17/2024 2010 by Ayyan Sites K, RN Outcome: Progressing

## 2024-06-19 DIAGNOSIS — I639 Cerebral infarction, unspecified: Secondary | ICD-10-CM | POA: Diagnosis not present

## 2024-06-19 MED ORDER — HYDRALAZINE HCL 25 MG PO TABS
25.0000 mg | ORAL_TABLET | Freq: Three times a day (TID) | ORAL | Status: DC
  Administered 2024-06-19 – 2024-06-21 (×8): 25 mg via ORAL
  Filled 2024-06-19 (×8): qty 1

## 2024-06-19 MED ORDER — AMLODIPINE BESYLATE 5 MG PO TABS
10.0000 mg | ORAL_TABLET | Freq: Every day | ORAL | Status: DC
  Administered 2024-06-19 – 2024-06-21 (×3): 10 mg via ORAL
  Filled 2024-06-19 (×3): qty 2

## 2024-06-19 NOTE — Progress Notes (Signed)
 Pt is stable, no neurological signs deficits per documented below. He is stable hemodynamically, afebrile, on room air, no acute distress noted. A fib on the monitor, HR is controlled rate. Pt is able to rest and sleep wel with no major complaints. His son is at bedside overnight, very supportive. Plan of care is reviewed. We will continue to monitor.     06/18/24 2000  Neurological  Neuro (WDL) X  Orientation Level Oriented X4  Cognition Appropriate attention/concentration;Appropriate judgement;Appropriate safety awareness;Appropriate for developmental age;Follows commands;No memory impairment  Speech Clear;Appropriate for developmental age  R Pupil Size (mm) 3  R Pupil Shape Round  R Pupil Reaction Brisk  L Pupil Size (mm) 3  L Pupil Shape Round  L Pupil Reaction Brisk  Motor Function/Sensation Assessment Grip;Head;Elbow extension;Pronator drift;Dorsiflexion;Plantar flexion;Elbow flexion;Arm ataxia;Leg ataxia;Motor response;Sensation;Motor strength  Facial Symmetry Symmetrical  R Hand Grip Strong  L Hand Grip Strong  R Elbow Extension (Push/Biceps) Strong  L Elbow Extension (Push/Biceps) Strong  R Elbow Flexion (Pull/Triceps) Strong  L Elbow Flexion (Pull/Triceps) Strong  Right Pronator Drift Absent  Left Pronator Drift Absent  R Foot Dorsiflexion Strong  L Foot Dorsiflexion Strong  R Foot Plantar Flexion Strong  L Foot Plantar Flexion Strong  R Finger to Nose (Point to [employer]) Smooth  L Finger to Nose (Point to [employer]) Smooth  R Heel to [employer] (Point to [employer]) Smooth  L Heel to [employer] to [employer]) Smooth  RUE Motor Response Purposeful movement  RUE Sensation Full sensation  RUE Motor Strength 5  LUE Motor Response Purposeful movement  LUE Sensation Full sensation  LUE Motor Strength 5  RLE Motor Response Purposeful movement  RLE Sensation Full sensation  RLE Motor Strength 5  LLE Motor Response Purposeful movement  LLE Sensation Full sensation  LLE Motor Strength 5   Neuro Symptoms None  Neuro symptoms relieved by Rest;Relaxation techniques (Comment)  Glasgow Coma Scale  Eye Opening 4  Best Verbal Response (NON-intubated) 5  Best Motor Response 6  Glasgow Coma Scale Score 15  NIH Stroke Scale   Dizziness Present No  Headache Present No  Interval Shift assessment  Level of Consciousness (1a.)    0  LOC Questions (1b. )    0  LOC Commands (1c. )    0  Best Gaze (2. )   0  Visual (3. )   0  Facial Palsy (4. )     0  Motor Arm, Left (5a. )    0  Motor Arm, Right (5b. )  0  Motor Leg, Left (6a. )   0  Motor Leg, Right (6b. )  0  Limb Ataxia (7. ) 0  Sensory (8. )   0  Best Language (9. )   0  Dysarthria (10. ) 0  Extinction/Inattention (11.)    0  Complete NIHSS TOTAL 0  Neurological  Level of Consciousness Alert    [name] Dash, RN

## 2024-06-19 NOTE — Progress Notes (Signed)
 PROGRESS NOTE    Dustin Ashley  FMW:[id number] DOB: August 17, 1940 DOA: 06/15/2024 PCP: Amon Aloysius BRAVO, MD   Brief Narrative:  Patient is a 83 year old male with past medical history of atrial fibrillation not on anticoagulation, history of hypertension, hyperlipidemia, history of coronary artery disease status post CABG and renal artery stenosis presented to hospital with sudden onset of dizziness which was also accompanied by nausea and multiple episodes of vomiting with vertigo.  Patient denied any fever, chills or rigor.  Denied any headache, shortness of breath, chest pain or dyspnea.  Denied any focal weakness.  He then had a fall. Patient was then brought into the hospital.  In the ED, blood pressure was slightly elevated.   CT head scan showed acute infarct in the posterior middle right temporal lobe occipital lobe and right cerebellum.  Code stroke was called in and neurology saw the patient.  Patient was admitted to hospital service.  Details of hospitalization below.  Assessment & Plan:   Principal Problem:   Acute stroke due to ischemia [facility]) Active Problems:   Hyperlipidemia   Essential hypertension   Paroxysmal A-fib (HCC)  Vertigo secondary to acute possible embolic stroke/essential hypertension/hyperlipidemia. MRI of the brain showed multiple posterior circulation infarct of varying ages with a large acute to early subacute right cerebellar infarct and acute to early subacute infarct in the left temporal parietal region and large subacute right PCA infarct.  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  Echo shows normal ejection fraction and no PFO.   LDL only 60 but HDL 38, patient has been started on atorvastatin .  Patient started on aspirin  and Plavix , however per neurology repeat CT scan appears to be showing worsening edema but improved hemorrhagic conversion so Plavix  was  dropped and he was started on Eliquis  and neurology recommended continuing aspirin  and. Seen by PT OT, CIR recommended.  He is pending insurance authorization for admission to CIR. patient has been stable for last 2 days for discharge.  Out of window to allow permissive hypertension.  Resumed home medications which include amlodipine , Lotensin  and atenolol  gradually, blood pressure still elevated, will increase amlodipine  to 10 mg and start on hydralazine  25 mg 3 times daily.  Atrial fibrillation not on anticoagulation.  Was not taking anticoagulation per his own decision despite of understanding that he was at risk of a stroke for not taking blood thinners.  Patient is willing to take Eliquis  now and he has been started on it.   History of insomnia.  On diazepam .  Will continue   History of CAD status post CABG, renal artery stenosis.  Continue aspirin  and statins   CKD stage IIIb: Baseline creatinine appears to be around 1.5.  Currently at baseline.  DVT prophylaxis: apixaban  (ELIQUIS ) tablet 2.5 mg Start: 06/18/24 2200   Code Status: Full Code  Family Communication: Son present at bedside.  Plan of care discussed with patient in length and he/she verbalized understanding and agreed with it.   Status is: Inpatient Remains inpatient appropriate because: Pending placement to CIR, medically stable since 06/17/2024   Estimated body mass index is 29.53 kg/m as calculated from the following:   Height as of this encounter: 5' 9 (1.753 m).   Weight as of this encounter: 90.7 kg.    Nutritional Assessment: Body mass index is 29.53 kg/m.[name] Seen by dietician.  I agree with the assessment and plan  as outlined below: Nutrition Status:        . Skin Assessment: I have examined the patient's skin and I agree with the wound assessment as performed by the wound care RN as outlined below:    Consultants:  Neurology  Procedures:  None  Antimicrobials:  Anti-infectives (From admission,  onward)    None         Subjective: Patient seen and examined, he says that his vision in the left eye is improving.  He has no other complaint.  Son at the bedside.  Objective: Vitals:   06/19/24 0401 06/19/24 0405 06/19/24 0407 06/19/24 1000  BP: (!) 172/80  (!) 161/66 (!) 158/83  Pulse: 75   80  Resp: 14  16   Temp: 98.6 F (37 C) 98.6 F (37 C)    TempSrc: Oral Oral    SpO2: 99%   99%  Weight:      Height:        Intake/Output Summary (Last 24 hours) at 06/19/2024 1139 Last data filed at 06/19/2024 0600 Gross per 24 hour  Intake 500 ml  Output --  Net 500 ml    Filed Weights   06/15/24 1114  Weight: 90.7 kg    Examination:  General exam: Appears calm and comfortable  Respiratory system: Clear to auscultation. Respiratory effort normal. Cardiovascular system: S1 & S2 heard, RRR. No JVD, murmurs, rubs, gallops or clicks. No pedal edema. Gastrointestinal system: Abdomen is nondistended, soft and nontender. No organomegaly or masses felt. Normal bowel sounds heard. Central nervous system: Alert and oriented. No focal neurological deficits. Extremities: Symmetric 5 x 5 power. Skin: No rashes, lesions or ulcers.  Psychiatry: Judgement and insight appear normal. Mood & affect appropriate.   Data Reviewed: I have personally reviewed following labs and imaging studies  CBC: Recent Labs  Lab 06/15/24 1152 06/15/24 1202 06/15/24 1935 06/16/24 0137 06/17/24 0248  WBC 17.8*  --  16.2* 15.9* 11.4*  NEUTROABS 14.6*  --   --   --  7.6  HGB 14.8 15.3 14.6 14.3 13.4  HCT 44.0 45.0 42.5 42.3 39.8  MCV 86.4  --  84.8 85.6 85.8  PLT 134*  --  155 153 160   Basic Metabolic Panel: Recent Labs  Lab 06/15/24 1152 06/15/24 1202 06/15/24 1935 06/16/24 0137 06/17/24 0248 06/18/24 0817  NA 138 141  --  140 140 139  K 4.5 4.1  --  4.2 3.8 4.1  CL 104 104  --  102 107 104  CO2 21*  --   --  24 27 27   GLUCOSE 127* 129*  --  117* 110* 115*  BUN 37* 36*  --  27* 28*  25*  CREATININE 1.29* 1.30* 1.26* 1.27* 1.50* 1.49*  CALCIUM  9.2  --   --  9.0 8.5* 8.8*  MG  --   --   --  1.8  --   --    GFR: Estimated Creatinine Clearance: 41.8 mL/min (A) (by C-G formula based on SCr of 1.49 mg/dL (H)). Liver Function Tests: Recent Labs  Lab 06/15/24 1152  AST 25  ALT 19  ALKPHOS 84  BILITOT 1.0  PROT 6.6  ALBUMIN 3.6   No results for input(s): LIPASE, AMYLASE in the last 168 hours. No results for input(s): AMMONIA in the last 168 hours. Coagulation Profile: Recent Labs  Lab 06/15/24 1152  INR 1.0   Cardiac Enzymes: Recent Labs  Lab 06/15/24 1152  CKTOTAL 109   BNP (last 3  results) No results for input(s): PROBNP in the last 8760 hours. HbA1C: No results for input(s): HGBA1C in the last 72 hours.  CBG: No results for input(s): GLUCAP in the last 168 hours. Lipid Profile: No results for input(s): CHOL, HDL, LDLCALC, TRIG, CHOLHDL, LDLDIRECT in the last 72 hours.  Thyroid  Function Tests: No results for input(s): TSH, T4TOTAL, FREET4, T3FREE, THYROIDAB in the last 72 hours. Anemia Panel: No results for input(s): VITAMINB12, FOLATE, FERRITIN, TIBC, IRON, RETICCTPCT in the last 72 hours. Sepsis Labs: Recent Labs  Lab 06/15/24 1202  LATICACIDVEN 1.9    No results found for this or any previous visit (from the past 240 hours).   Radiology Studies: CT HEAD WO CONTRAST ( ) Result Date: 06/18/2024 EXAM: CT HEAD WITHOUT CONTRAST 06/18/2024 10:29:37 AM TECHNIQUE: CT of the head was performed without the administration of intravenous contrast. Automated exposure control, iterative reconstruction, and/or weight based adjustment of the mA/kV was utilized to reduce the radiation dose to as low as reasonably achievable. COMPARISON: Head CT, CTA, and MRI 06/15/2024. CLINICAL HISTORY: Stroke, follow up. FINDINGS: BRAIN AND VENTRICLES: Large subacute right cerebellar and right PCA infarcts are again seen.  Cerebellar edema has mildly worsened with mildly increased effacement of the 4th ventricle, and the lateral and third ventricles are slightly larger than before. There is petechial hemorrhage associated with both the cerebellar and PCA infarcts. Smaller recent left cerebellar infarcts are unchanged. No new infarct or malignant hemorrhagic transformation is identified. There is no midline shift or extra-axial fluid collection. A background of mild chronic small vessel ischemia in the cerebral white matter and mild cerebral atrophy is noted. Calcified atherosclerosis at the skull base. ORBITS: Bilateral cataract extraction. SINUSES: Minimal mucosal thickening in the paranasal sinuses. No significant mastoid fluid. SOFT TISSUES AND SKULL: No acute soft tissue abnormality. No skull fracture. IMPRESSION: 1. Mildly worsening edema associated with the large subacute right cerebellar infarct with petechial hemorrhage. Mildly increased effacement of the 4th ventricle with slight interval dilatation of the lateral and third ventricles which may reflect early hydrocephalus. 2. Unchanged large subacute right PCA and small left cerebellar infarcts. Electronically signed by: Dasie Hamburg MD 06/18/2024 10:58 AM EST RP Workstation: [id number]    Scheduled Meds:  amLODipine   10 mg Oral Daily   apixaban   2.5 mg Oral BID   aspirin  EC  81 mg Oral Daily   atenolol   75 mg Oral Daily   atorvastatin   20 mg Oral Daily   benazepril   20 mg Oral BID   hydrALAZINE   25 mg Oral TID   Continuous Infusions:     LOS: 4 days   Fredia Skeeter, MD [facility]  06/19/2024, 11:39 AM   *Please note that this is a verbal dictation therefore any spelling or grammatical errors are due to the Dragon Medical One system interpretation.  Please page via Amion and do not message via secure chat for urgent patient care matters. Secure chat can be used for non urgent patient care matters.  How to contact the [facility] Attending or  Consulting provider 7A - 7P or covering provider during after hours 7P -7A, for this patient?  Check the care team in [facility] and look for a) attending/consulting [facility] provider listed and b) the [facility] team listed. Page or secure chat 7A-7P. Log into [email or web address] and use [place]'s universal password to access. If you do not have the password, please contact the hospital operator. Locate the [facility] provider you are looking for under [facility] and page to  a number that you can be directly reached. If you still have difficulty reaching the provider, please page the [facility] (Director on Call) for the Hospitalists listed on amion for assistance.

## 2024-06-19 NOTE — Plan of Care (Signed)
  Problem: Education: Goal: Knowledge of disease or condition will improve Outcome: Progressing Goal: Knowledge of secondary prevention will improve (MUST DOCUMENT ALL) Outcome: Progressing Goal: Knowledge of patient specific risk factors will improve (DELETE if not current risk factor) Outcome: Progressing   Problem: Ischemic Stroke/TIA Tissue Perfusion: Goal: Complications of ischemic stroke/TIA will be minimized Outcome: Progressing   Problem: Coping: Goal: Will verbalize positive feelings about self Outcome: Progressing Goal: Will identify appropriate support needs Outcome: Progressing   Problem: Health Behavior/Discharge Planning: Goal: Ability to manage health-related needs will improve Outcome: Progressing Goal: Goals will be collaboratively established with patient/family Outcome: Progressing   Problem: Self-Care: Goal: Ability to participate in self-care as condition permits will improve Outcome: Progressing Goal: Verbalization of feelings and concerns over difficulty with self-care will improve Outcome: Progressing Goal: Ability to communicate needs accurately will improve Outcome: Progressing   Problem: Nutrition: Goal: Risk of aspiration will decrease Outcome: Progressing Goal: Dietary intake will improve Outcome: Progressing   Problem: Clinical Measurements: Goal: Ability to maintain clinical measurements within normal limits will improve Outcome: Progressing Goal: Will remain free from infection Outcome: Progressing Goal: Diagnostic test results will improve Outcome: Progressing Goal: Respiratory complications will improve Outcome: Progressing Goal: Cardiovascular complication will be avoided Outcome: Progressing

## 2024-06-19 NOTE — Progress Notes (Signed)
   06/18/24 2000  Mobility (See group info for [facility] equipment and weight capacities recommendations)  HOB Elevated/Bed Position Self regulated  Activity Ambulated with assistance  Range of Motion/Exercises Active Assistive;All extremities  Level of Assistance Contact guard assist, steadying assist  Assistive Device Front wheel walker  Distance Ambulated (ft) 60 ft  Activity Response Tolerated well  Transport method Ambulatory   [name] Dash, RN

## 2024-06-20 DIAGNOSIS — I639 Cerebral infarction, unspecified: Secondary | ICD-10-CM | POA: Diagnosis not present

## 2024-06-20 NOTE — Plan of Care (Signed)

## 2024-06-20 NOTE — Plan of Care (Signed)
 Problem: Education: Goal: Knowledge of disease or condition will improve 06/20/2024 0411 by Jori Roderic CROME, RN Outcome: Progressing 06/20/2024 0411 by Jori Roderic CROME, RN Outcome: Progressing Goal: Knowledge of secondary prevention will improve (MUST DOCUMENT ALL) 06/20/2024 0411 by Jori Roderic CROME, RN Outcome: Progressing 06/20/2024 0411 by Jori Roderic CROME, RN Outcome: Progressing Goal: Knowledge of patient specific risk factors will improve (DELETE if not current risk factor) 06/20/2024 0411 by Jori Roderic CROME, RN Outcome: Progressing 06/20/2024 0411 by Jori Roderic CROME, RN Outcome: Progressing   Problem: Ischemic Stroke/TIA Tissue Perfusion: Goal: Complications of ischemic stroke/TIA will be minimized 06/20/2024 0411 by Jori Roderic CROME, RN Outcome: Progressing 06/20/2024 0411 by Jori Roderic CROME, RN Outcome: Progressing   Problem: Coping: Goal: Will verbalize positive feelings about self 06/20/2024 0411 by Jori Roderic CROME, RN Outcome: Progressing 06/20/2024 0411 by Jori Roderic CROME, RN Outcome: Progressing Goal: Will identify appropriate support needs 06/20/2024 0411 by Jori Roderic CROME, RN Outcome: Progressing 06/20/2024 0411 by Jori Roderic CROME, RN Outcome: Progressing   Problem: Health Behavior/Discharge Planning: Goal: Ability to manage health-related needs will improve 06/20/2024 0411 by Jori Roderic CROME, RN Outcome: Progressing 06/20/2024 0411 by Jori Roderic CROME, RN Outcome: Progressing Goal: Goals will be collaboratively established with patient/family 06/20/2024 0411 by Jori Roderic CROME, RN Outcome: Progressing 06/20/2024 0411 by Jori Roderic CROME, RN Outcome: Progressing   Problem: Self-Care: Goal: Ability to participate in self-care as condition permits will improve 06/20/2024 0411 by Jori Roderic CROME, RN Outcome: Progressing 06/20/2024 0411 by Jori Roderic CROME, RN Outcome:  Progressing Goal: Verbalization of feelings and concerns over difficulty with self-care will improve 06/20/2024 0411 by Jori Roderic CROME, RN Outcome: Progressing 06/20/2024 0411 by Jori Roderic CROME, RN Outcome: Progressing Goal: Ability to communicate needs accurately will improve 06/20/2024 0411 by Jori Roderic CROME, RN Outcome: Progressing 06/20/2024 0411 by Jori Roderic CROME, RN Outcome: Progressing   Problem: Nutrition: Goal: Risk of aspiration will decrease 06/20/2024 0411 by Jori Roderic CROME, RN Outcome: Progressing 06/20/2024 0411 by Jori Roderic CROME, RN Outcome: Progressing Goal: Dietary intake will improve 06/20/2024 0411 by Jori Roderic CROME, RN Outcome: Progressing 06/20/2024 0411 by Jori Roderic CROME, RN Outcome: Progressing   Problem: Education: Goal: Knowledge of General Education information will improve Description: Including pain rating scale, medication(s)/side effects and non-pharmacologic comfort measures 06/20/2024 0411 by Jori Roderic CROME, RN Outcome: Progressing 06/20/2024 0411 by Jori Roderic CROME, RN Outcome: Progressing   Problem: Health Behavior/Discharge Planning: Goal: Ability to manage health-related needs will improve 06/20/2024 0411 by Jori Roderic CROME, RN Outcome: Progressing 06/20/2024 0411 by Jori Roderic CROME, RN Outcome: Progressing   Problem: Clinical Measurements: Goal: Ability to maintain clinical measurements within normal limits will improve 06/20/2024 0411 by Jori Roderic CROME, RN Outcome: Progressing 06/20/2024 0411 by Jori Roderic CROME, RN Outcome: Progressing Goal: Will remain free from infection 06/20/2024 0411 by Jori Roderic CROME, RN Outcome: Progressing 06/20/2024 0411 by Jori Roderic CROME, RN Outcome: Progressing Goal: Diagnostic test results will improve 06/20/2024 0411 by Jori Roderic CROME, RN Outcome: Progressing 06/20/2024 0411 by Jori Roderic CROME, RN Outcome:  Progressing Goal: Respiratory complications will improve 06/20/2024 0411 by Jori Roderic CROME, RN Outcome: Progressing 06/20/2024 0411 by Jori Roderic CROME, RN Outcome: Progressing Goal: Cardiovascular complication will be avoided 06/20/2024 0411 by Jori Roderic CROME, RN Outcome: Progressing 06/20/2024 0411 by Jori Roderic CROME, RN Outcome: Progressing   Problem: Activity: Goal: Risk for activity intolerance will decrease 06/20/2024 0411 by Jori Roderic CROME, RN Outcome: Progressing 06/20/2024 0411 by  Jori Roderic CROME, RN Outcome: Progressing   Problem: Nutrition: Goal: Adequate nutrition will be maintained 06/20/2024 0411 by Jori Roderic CROME, RN Outcome: Progressing 06/20/2024 0411 by Jori Roderic CROME, RN Outcome: Progressing   Problem: Coping: Goal: Level of anxiety will decrease 06/20/2024 0411 by Jori Roderic CROME, RN Outcome: Progressing 06/20/2024 0411 by Jori Roderic CROME, RN Outcome: Progressing   Problem: Elimination: Goal: Will not experience complications related to bowel motility 06/20/2024 0411 by Jori Roderic CROME, RN Outcome: Progressing 06/20/2024 0411 by Jori Roderic CROME, RN Outcome: Progressing Goal: Will not experience complications related to urinary retention 06/20/2024 0411 by Jori Roderic CROME, RN Outcome: Progressing 06/20/2024 0411 by Jori Roderic CROME, RN Outcome: Progressing   Problem: Pain Managment: Goal: General experience of comfort will improve and/or be controlled 06/20/2024 0411 by Jori Roderic CROME, RN Outcome: Progressing 06/20/2024 0411 by Jori Roderic CROME, RN Outcome: Progressing   Problem: Safety: Goal: Ability to remain free from injury will improve 06/20/2024 0411 by Jori Roderic CROME, RN Outcome: Progressing 06/20/2024 0411 by Jori Roderic CROME, RN Outcome: Progressing   Problem: Skin Integrity: Goal: Risk for impaired skin integrity will decrease 06/20/2024 0411 by Jori Roderic CROME, RN Outcome: Progressing 06/20/2024 0411 by Jori Roderic CROME, RN Outcome: Progressing

## 2024-06-20 NOTE — Progress Notes (Signed)
 PROGRESS NOTE  KRISTY CATOE  DOB: 07-Nov-1940  PCP: Amon Aloysius BRAVO, MD FMW:[id number]  DOA: 06/15/2024  LOS: 5 days  Hospital Day: 6  Subjective: Patient was seen and examined this morning. Pleasant elderly Caucasian male.  Proctogram.  Not in distress.  Family at bedside. Afebrile, blood pressure in 140s to 150s, breathing on room air  Brief narrative: DVID PENDRY is a 83 y.o. male with PMH significant for HTN, HLD, CAD s/p CABG, paroxysmal A-fib, renal artery stenosis, arthritis, GERD 12/2, patient presented to ED with sudden onset of dizziness associated with nausea and multiple episodes of vomiting leading to a fall.  On initial exam, patient did not have any focal neurological deficit.  CT head showed an acute infarct in the posterior middle right temporal lobe occipital lobe and right cerebellum.  Code stroke was called  Seen by neurology  Admitted to [facility]  Stroke workup completed  MRI of the brain showed multiple posterior circulation infarct of varying ages with a large acute to early subacute right cerebellar infarct and acute to early subacute infarct in the left temporal parietal region and large subacute right PCA infarct.  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 PTA meds- aspirin  81 mg daily Per neurology recommendation, currently on a combination of aspirin  81 mg daily and Eliquis  2.5 mg twice daily Seen by PT/OT.  CIR recommended Meclizine  as needed dizziness It seems patient has been on dysphagia 3 diet since admission.   Patient and family requested it to be advanced to regular diet and hence I have advance diet to regular today.  I have asked family as well as RN to keep an eye to see if he has any choking tendency.  A-fib Currently on atenolol   previously not on anticoagulation. After the discussion the patient by previous providers, he was started on Eliquis  high risk of recurrent stroke  Hypertension Currently blood pressure is controlled on amlodipine  10 mg daily, atenolol  75 mg daily, benazepril  20 mg twice daily, hydralazine  25 mg 3 times daily  HLD Statin  H/o CAD s/p CABG Continue aspirin  and statin  CKD 3B Renal artery stenosis Creatinine at baseline Recent Labs    10/13/23 0930 02/12/24 1151 04/12/24 1014 06/15/24 1152 06/15/24 1202 06/15/24 1935 06/16/24 0137 06/17/24 0248 06/18/24 0817  BUN 25* 27* 34* 37* 36*  --  27* 28* 25*  CREATININE 1.65* 1.40 1.62* 1.29* 1.30* 1.26* 1.27* 1.50* 1.49*   H/o insomnia Continue diazepam .    Impaired mobility  Seen by PT.  CIR recommended PT Follow up Rec: Acute Inpatient Rehab (3hours/Day)06/18/2024 1653    Goals of care   Code Status: Full Code     DVT prophylaxis:  apixaban  (ELIQUIS ) tablet 2.5 mg Start: 06/18/24 2200 apixaban  (ELIQUIS ) tablet 2.5 mg   Antimicrobials: None Fluid: None Consultants: Neurology Family Communication: Family at bedside  Status: Inpatient Level of care:  Telemetry   Patient is from: Home Needs to continue in-hospital care: Medically stable for discharge.  Pending CIR   Diet:  Diet Order  Diet regular Room service appropriate? Yes; Fluid consistency: Thin  Diet effective now                   Scheduled Meds:  amLODipine   10 mg Oral Daily   apixaban   2.5 mg Oral BID   aspirin  EC  81 mg Oral Daily   atenolol   75 mg Oral Daily   atorvastatin   20 mg Oral Daily   benazepril   20 mg Oral BID   hydrALAZINE   25 mg Oral TID    PRN meds: [DISCONTINUED] acetaminophen  **OR**  [DISCONTINUED] acetaminophen  (TYLENOL ) oral liquid 160 mg/5 mL **OR** acetaminophen , acetaminophen , diazepam , hydrALAZINE , meclizine , oxyCODONE , senna-docusate   Infusions:    Antimicrobials: Anti-infectives (From admission, onward)    None       Objective: Vitals:   06/20/24 0756 06/20/24 1205  BP: (!) 157/87 135/71  Pulse: 85 64  Resp:  16  Temp: 98.3 F (36.8 C) 97.7 F (36.5 C)  SpO2: 96% 96%    Intake/Output Summary (Last 24 hours) at 06/20/2024 1452 Last data filed at 06/20/2024 0800 Gross per 24 hour  Intake 240 ml  Output --  Net 240 ml   Filed Weights   06/15/24 1114  Weight: 90.7 kg   Weight change:  Body mass index is 29.53 kg/m.   Physical Exam: General exam: Pleasant, elderly Caucasian male Skin: No rashes, lesions or ulcers. HEENT: Atraumatic, normocephalic, no obvious bleeding Lungs: Clear to auscultation bilaterally,  CVS: S1, S2, no murmur,   GI/Abd: Soft, nontender, nondistended, bowel sound present,   CNS: Alert, awake, oriented to place and person. Psychiatry: Mood appropriate Extremities: No pedal edema, no calf tenderness,   Data Review: I have personally reviewed the laboratory data and studies available.  F/u labs ordered Unresulted Labs (From admission, onward)     Start     Ordered   06/15/24 1058  Urinalysis, w/ Reflex to Culture (Infection Suspected) -Urine, Clean Catch  Once,   URGENT       Question:  Specimen Source  Answer:  Urine, Clean Catch   06/15/24 1057   06/15/24 1058  Urine rapid drug screen (hosp performed)  Once,   STAT        06/15/24 1057            Signed, Chapman Rota, MD [facility] 06/20/2024

## 2024-06-21 ENCOUNTER — Inpatient Hospital Stay (HOSPITAL_COMMUNITY)
Admission: AD | Admit: 2024-06-21 | Discharge: 2024-06-29 | DRG: 057 | Disposition: A | Source: Intra-hospital | Attending: Physical Medicine and Rehabilitation | Admitting: Physical Medicine and Rehabilitation

## 2024-06-21 ENCOUNTER — Other Ambulatory Visit: Payer: Self-pay

## 2024-06-21 ENCOUNTER — Encounter (HOSPITAL_COMMUNITY): Payer: Self-pay | Admitting: Physical Medicine and Rehabilitation

## 2024-06-21 DIAGNOSIS — I69318 Other symptoms and signs involving cognitive functions following cerebral infarction: Secondary | ICD-10-CM

## 2024-06-21 DIAGNOSIS — Z87891 Personal history of nicotine dependence: Secondary | ICD-10-CM | POA: Diagnosis not present

## 2024-06-21 DIAGNOSIS — Z974 Presence of external hearing-aid: Secondary | ICD-10-CM

## 2024-06-21 DIAGNOSIS — I480 Paroxysmal atrial fibrillation: Secondary | ICD-10-CM | POA: Diagnosis present

## 2024-06-21 DIAGNOSIS — Z79899 Other long term (current) drug therapy: Secondary | ICD-10-CM

## 2024-06-21 DIAGNOSIS — E785 Hyperlipidemia, unspecified: Secondary | ICD-10-CM | POA: Diagnosis present

## 2024-06-21 DIAGNOSIS — I63441 Cerebral infarction due to embolism of right cerebellar artery: Principal | ICD-10-CM | POA: Diagnosis present

## 2024-06-21 DIAGNOSIS — N182 Chronic kidney disease, stage 2 (mild): Secondary | ICD-10-CM | POA: Diagnosis present

## 2024-06-21 DIAGNOSIS — Z7901 Long term (current) use of anticoagulants: Secondary | ICD-10-CM | POA: Diagnosis not present

## 2024-06-21 DIAGNOSIS — I2510 Atherosclerotic heart disease of native coronary artery without angina pectoris: Secondary | ICD-10-CM | POA: Diagnosis present

## 2024-06-21 DIAGNOSIS — N183 Chronic kidney disease, stage 3 unspecified: Secondary | ICD-10-CM | POA: Insufficient documentation

## 2024-06-21 DIAGNOSIS — I69351 Hemiplegia and hemiparesis following cerebral infarction affecting right dominant side: Principal | ICD-10-CM

## 2024-06-21 DIAGNOSIS — Z8249 Family history of ischemic heart disease and other diseases of the circulatory system: Secondary | ICD-10-CM

## 2024-06-21 DIAGNOSIS — G4700 Insomnia, unspecified: Secondary | ICD-10-CM | POA: Diagnosis present

## 2024-06-21 DIAGNOSIS — M1990 Unspecified osteoarthritis, unspecified site: Secondary | ICD-10-CM | POA: Diagnosis present

## 2024-06-21 DIAGNOSIS — Z951 Presence of aortocoronary bypass graft: Secondary | ICD-10-CM

## 2024-06-21 DIAGNOSIS — I10 Essential (primary) hypertension: Secondary | ICD-10-CM | POA: Diagnosis not present

## 2024-06-21 DIAGNOSIS — Z885 Allergy status to narcotic agent status: Secondary | ICD-10-CM | POA: Diagnosis not present

## 2024-06-21 DIAGNOSIS — M545 Low back pain, unspecified: Secondary | ICD-10-CM | POA: Diagnosis present

## 2024-06-21 DIAGNOSIS — H9192 Unspecified hearing loss, left ear: Secondary | ICD-10-CM | POA: Diagnosis present

## 2024-06-21 DIAGNOSIS — Z85828 Personal history of other malignant neoplasm of skin: Secondary | ICD-10-CM

## 2024-06-21 DIAGNOSIS — I129 Hypertensive chronic kidney disease with stage 1 through stage 4 chronic kidney disease, or unspecified chronic kidney disease: Secondary | ICD-10-CM | POA: Diagnosis present

## 2024-06-21 DIAGNOSIS — K5900 Constipation, unspecified: Secondary | ICD-10-CM | POA: Diagnosis not present

## 2024-06-21 DIAGNOSIS — Z9049 Acquired absence of other specified parts of digestive tract: Secondary | ICD-10-CM | POA: Diagnosis not present

## 2024-06-21 DIAGNOSIS — H9193 Unspecified hearing loss, bilateral: Secondary | ICD-10-CM | POA: Diagnosis present

## 2024-06-21 MED ORDER — BISACODYL 10 MG RE SUPP: 10.0000 mg | Freq: Every day | RECTAL | Status: DC | PRN

## 2024-06-21 MED ORDER — PROCHLORPERAZINE MALEATE 5 MG PO TABS: 5.0000 mg | ORAL_TABLET | Freq: Four times a day (QID) | ORAL | Status: DC | PRN

## 2024-06-21 MED ORDER — BLOOD PRESSURE CONTROL BOOK
Freq: Once | Status: AC
  Filled 2024-06-21: qty 1

## 2024-06-21 MED ORDER — HYDRALAZINE HCL 25 MG PO TABS: 25.0000 mg | ORAL_TABLET | Freq: Three times a day (TID) | ORAL | Status: DC

## 2024-06-21 MED ORDER — DIPHENHYDRAMINE HCL 25 MG PO CAPS: 25.0000 mg | ORAL_CAPSULE | Freq: Four times a day (QID) | ORAL | Status: DC | PRN

## 2024-06-21 MED ORDER — BENAZEPRIL HCL 20 MG PO TABS
20.0000 mg | ORAL_TABLET | Freq: Two times a day (BID) | ORAL | Status: DC
  Administered 2024-06-21 – 2024-06-23 (×4): 20 mg via ORAL
  Filled 2024-06-21 (×5): qty 1

## 2024-06-21 MED ORDER — MECLIZINE HCL 25 MG PO TABS: 25.0000 mg | ORAL_TABLET | Freq: Three times a day (TID) | ORAL | Status: DC | PRN

## 2024-06-21 MED ORDER — ALUM & MAG HYDROXIDE-SIMETH 200-200-20 MG/5ML PO SUSP: 30.0000 mL | ORAL | Status: DC | PRN

## 2024-06-21 MED ORDER — AMLODIPINE BESYLATE 10 MG PO TABS
10.0000 mg | ORAL_TABLET | Freq: Every day | ORAL | Status: DC
  Administered 2024-06-22 – 2024-06-29 (×8): 10 mg via ORAL
  Filled 2024-06-21 (×8): qty 1

## 2024-06-21 MED ORDER — ACETAMINOPHEN 325 MG PO TABS: 325.0000 mg | ORAL_TABLET | ORAL | Status: DC | PRN

## 2024-06-21 MED ORDER — ATENOLOL 50 MG PO TABS
75.0000 mg | ORAL_TABLET | Freq: Every day | ORAL | Status: DC
  Administered 2024-06-22 – 2024-06-29 (×8): 75 mg via ORAL
  Filled 2024-06-21 (×8): qty 1

## 2024-06-21 MED ORDER — APIXABAN 2.5 MG PO TABS
2.5000 mg | ORAL_TABLET | Freq: Two times a day (BID) | ORAL | Status: AC
  Administered 2024-06-21 – 2024-06-29 (×16): 2.5 mg via ORAL
  Filled 2024-06-21 (×16): qty 1

## 2024-06-21 MED ORDER — GUAIFENESIN-DM 100-10 MG/5ML PO SYRP: 5.0000 mL | ORAL_SOLUTION | Freq: Four times a day (QID) | ORAL | Status: DC | PRN

## 2024-06-21 MED ORDER — ACETAMINOPHEN 325 MG PO TABS: 650.0000 mg | ORAL_TABLET | Freq: Four times a day (QID) | ORAL | Status: AC | PRN

## 2024-06-21 MED ORDER — SENNOSIDES-DOCUSATE SODIUM 8.6-50 MG PO TABS: 1.0000 | ORAL_TABLET | Freq: Every evening | ORAL | Status: DC | PRN

## 2024-06-21 MED ORDER — OXYCODONE HCL 5 MG PO TABS: 2.5000 mg | ORAL_TABLET | Freq: Four times a day (QID) | ORAL | Status: DC | PRN

## 2024-06-21 MED ORDER — FLEET ENEMA RE ENEM: 1.0000 | ENEMA | Freq: Once | RECTAL | Status: DC | PRN

## 2024-06-21 MED ORDER — AMLODIPINE BESYLATE 10 MG PO TABS: 10.0000 mg | ORAL_TABLET | Freq: Every day | ORAL | Status: DC

## 2024-06-21 MED ORDER — ATORVASTATIN CALCIUM 10 MG PO TABS
20.0000 mg | ORAL_TABLET | Freq: Every day | ORAL | Status: DC
  Administered 2024-06-22 – 2024-06-29 (×8): 20 mg via ORAL
  Filled 2024-06-21 (×8): qty 2

## 2024-06-21 MED ORDER — ASPIRIN 81 MG PO TBEC: 81.0000 mg | DELAYED_RELEASE_TABLET | Freq: Every day | ORAL | Status: DC

## 2024-06-21 MED ORDER — DIAZEPAM 5 MG PO TABS
10.0000 mg | ORAL_TABLET | Freq: Two times a day (BID) | ORAL | Status: DC | PRN
  Administered 2024-06-24: 5 mg via ORAL
  Administered 2024-06-25 – 2024-06-28 (×4): 10 mg via ORAL
  Filled 2024-06-21 (×5): qty 2

## 2024-06-21 MED ORDER — PROCHLORPERAZINE 25 MG RE SUPP: 12.5000 mg | Freq: Four times a day (QID) | RECTAL | Status: DC | PRN

## 2024-06-21 MED ORDER — ASPIRIN 81 MG PO TBEC
81.0000 mg | DELAYED_RELEASE_TABLET | Freq: Every day | ORAL | Status: DC
  Administered 2024-06-22 – 2024-06-29 (×8): 81 mg via ORAL
  Filled 2024-06-21 (×8): qty 1

## 2024-06-21 MED ORDER — PROCHLORPERAZINE EDISYLATE 10 MG/2ML IJ SOLN: 5.0000 mg | Freq: Four times a day (QID) | INTRAMUSCULAR | Status: DC | PRN

## 2024-06-21 NOTE — Discharge Summary (Signed)
 Physician Discharge Summary  Dustin Ashley FMW:[id number] DOB: 1941/01/24 DOA: 06/15/2024  PCP: Amon Aloysius BRAVO, MD  Admit date: 06/15/2024 Discharge date: 06/21/2024  Admitted from: Home Discharge disposition: CIR  Recommendations at discharge:  Per neurology recommendation, you have been started on a combination of aspirin  81 mg daily and Eliquis  2.5 mg twice daily   Subjective: Patient was seen and examined this morning. Lying in bed.  Not in distress. Tolerated regular diet  Afebrile, blood pressure between 120s to 140s, breathing on room air No recent labs since 12/5  Brief narrative: Dustin Ashley is a 83 y.o. male with PMH significant for HTN, HLD, CAD s/p CABG, paroxysmal A-fib, renal artery stenosis, arthritis, GERD 12/2, patient presented to ED with sudden onset of dizziness associated with nausea and multiple episodes of vomiting leading to a fall.  On initial exam, patient did not have any focal neurological deficit.  CT head showed an acute infarct in the posterior middle right temporal lobe occipital lobe and right cerebellum.  Code stroke was called  Seen by neurology  Admitted to [facility]  Stroke workup completed  MRI of the brain showed multiple posterior circulation infarct of varying ages with a large acute to early subacute right cerebellar infarct and acute to early subacute infarct in the left temporal parietal region and large subacute right PCA infarct.  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 PTA meds- aspirin  81 mg  daily Per neurology recommendation, currently on a combination of aspirin  81 mg daily and Eliquis  2.5 mg twice daily Seen by PT/OT.  CIR recommended Meclizine  as needed dizziness  A-fib Currently on atenolol   previously not on anticoagulation. After the discussion the patient by previous providers, he was started on Eliquis  high risk of recurrent stroke  Hypertension Currently blood pressure is controlled on amlodipine  10 mg daily, atenolol  75 mg daily, benazepril  20 mg twice daily, hydralazine  25 mg 3 times daily  HLD Statin  H/o CAD s/p CABG Continue Eliquis , aspirin  and statin  CKD 3B Renal artery stenosis Creatinine at baseline Recent Labs    10/13/23 0930 02/12/24 1151 04/12/24 1014 06/15/24 1152 06/15/24 1202 06/15/24 1935 06/16/24 0137 06/17/24 0248 06/18/24 0817  BUN 25* 27* 34* 37* 36*  --  27* 28* 25*  CREATININE 1.65* 1.40 1.62* 1.29* 1.30* 1.26* 1.27* 1.50* 1.49*   H/o insomnia Continue diazepam .    Impaired mobility  Seen by PT.  CIR recommended PT Follow up Rec: Acute Inpatient Rehab (3hours/Day)06/18/2024 1653    Goals of care   Code Status: Full Code   Diet:  Diet Order             Diet general           Diet regular Room service appropriate? Yes; Fluid consistency: Thin  Diet effective now                   Nutritional status:  Body mass index is 29.53 kg/m.       Wounds:  -    Discharge Medications:   Allergies as of 06/21/2024  Reactions   Hydrocodone -acetaminophen  Other (See Comments)   REACTION: bladder obstruction   Tramadol Hcl Other (See Comments)   REACTION: bladder obstruction        Medication List     TAKE these medications    acetaminophen  325 MG tablet Commonly known as: TYLENOL  Take 2 tablets (650 mg total) by mouth every 6 (six) hours as needed for mild pain (pain score 1-3) or headache.   amLODipine  10 MG tablet Commonly known as: NORVASC  Take 1 tablet (10 mg total) by mouth  daily. Start taking on: June 22, 2024 What changed:  medication strength how much to take   aspirin  EC 81 MG tablet Take 1 tablet (81 mg total) by mouth daily. Swallow whole. Start taking on: June 22, 2024   atenolol  50 MG tablet Commonly known as: TENORMIN  Take 1.5 tablets (75 mg total) by mouth daily.   atorvastatin  20 MG tablet Commonly known as: LIPITOR TAKE 1 TABLET BY MOUTH EVERY DAY   benazepril  40 MG tablet Commonly known as: Lotensin  Take 0.5 tablets (20 mg total) by mouth 2 (two) times daily.   benzonatate  100 MG capsule Commonly known as: Tessalon  Perles Take 1 capsule (100 mg total) by mouth 3 (three) times daily as needed for cough.   diazepam  10 MG tablet Commonly known as: VALIUM  Take 1 tablet (10 mg total) by mouth every 12 (twelve) hours as needed for anxiety or sleep.   Eliquis  2.5 MG Tabs tablet Generic drug: apixaban  Take 1 tablet (2.5 mg total) by mouth 2 (two) times daily.   fexofenadine 180 MG tablet Commonly known as: ALLEGRA Take 180 mg by mouth daily.   hydrALAZINE  25 MG tablet Commonly known as: APRESOLINE  Take 1 tablet (25 mg total) by mouth 3 (three) times daily.   meclizine  25 MG tablet Commonly known as: ANTIVERT  Take 1 tablet (25 mg total) by mouth 3 (three) times daily as needed for dizziness or nausea.   oxyCODONE  5 MG immediate release tablet Commonly known as: Oxy IR/ROXICODONE  Take 0.5 tablets (2.5 mg total) by mouth every 6 (six) hours as needed for severe pain (pain score 7-10).   senna-docusate 8.6-50 MG tablet Commonly known as: Senokot-S Take 1 tablet by mouth at bedtime as needed for mild constipation or moderate constipation.         Follow ups:    Follow-up Information     Amon Aloysius BRAVO, MD Follow up.   Specialty: Internal Medicine Contact information: [address] [place] [address] [phone]                 Discharge Instructions:   Discharge Instructions      Call MD for:  difficulty breathing, headache or visual disturbances   Complete by: As directed    Call MD for:  extreme fatigue   Complete by: As directed    Call MD for:  hives   Complete by: As directed    Call MD for:  persistant dizziness or light-headedness   Complete by: As directed    Call MD for:  persistant nausea and vomiting   Complete by: As directed    Call MD for:  severe uncontrolled pain   Complete by: As directed    Call MD for:  temperature >100.4   Complete by: As directed    Diet general   Complete by: As directed    Discharge instructions   Complete by: As directed    Recommendations at discharge:  Per neurology recommendation, you have been started on a combination of aspirin  81 mg daily and Eliquis  2.5 mg twice daily  General discharge instructions: Follow with Primary MD Amon Aloysius BRAVO, MD in 7 days  Please request your PCP  to go over your hospital tests, procedures, radiology results at the follow up. Please get your medicines reviewed and adjusted.  Your PCP may decide to repeat certain labs or tests as needed. Do not drive, operate heavy machinery, perform activities at heights, swimming or participation in water activities or provide baby sitting services if your were admitted for syncope or siezures until you have seen by Primary MD or a Neurologist and advised to do so again. [place]  Controlled Substance Reporting System database was reviewed. Do not drive, operate heavy machinery, perform activities at heights, swim, participate in water activities or provide baby-sitting services while on medications for pain, sleep and mood until your outpatient physician has reevaluated you and advised to do so again.  You are strongly recommended to comply with the dose, frequency and duration of prescribed medications. Activity: As tolerated with Full fall precautions use walker/cane & assistance as needed Avoid using any recreational substances like cigarette,  tobacco, alcohol, or non-prescribed drug. If you experience worsening of your admission symptoms, develop shortness of breath, life threatening emergency, suicidal or homicidal thoughts you must seek medical attention immediately by calling 911 or calling your MD immediately  if symptoms less severe. You must read complete instructions/literature along with all the possible adverse reactions/side effects for all the medicines you take and that have been prescribed to you. Take any new medicine only after you have completely understood and accepted all the possible adverse reactions/side effects.  Wear Seat belts while driving. You were cared for by a hospitalist during your hospital stay. If you have any questions about your discharge medications or the care you received while you were in the hospital after you are discharged, you can call the unit and ask to speak with the hospitalist or the covering physician. Once you are discharged, your primary care physician will handle any further medical issues. Please note that NO REFILLS for any discharge medications will be authorized once you are discharged, as it is imperative that you return to your primary care physician (or establish a relationship with a primary care physician if you do not have one).   Increase activity slowly   Complete by: As directed        Discharge Exam:   Vitals:   06/21/24 0000 06/21/24 0400 06/21/24 0739 06/21/24 1111  BP: (!) 127/59 (!) 155/92 (!) 143/65 136/68  Pulse: 82 76 83 72  Resp: 13 13 19 17   Temp: (!) 97.5 F (36.4 C) 98 F (36.7 C) 97.7 F (36.5 C) 98.1 F (36.7 C)  TempSrc: Oral Oral Oral Oral  SpO2:  96% 96% 95%  Weight:      Height:        Body mass index is 29.53 kg/m.   General exam: Pleasant, elderly Caucasian male Skin: No rashes, lesions or ulcers. HEENT: Atraumatic, normocephalic, no obvious bleeding Lungs: Clear to auscultation bilaterally,  CVS: S1, S2, no murmur,   GI/Abd: Soft,  nontender, nondistended, bowel sound present,   CNS: Alert, awake, oriented x 3 Psychiatry: Mood appropriate Extremities: No pedal edema, no calf tenderness,    The results of significant diagnostics from this hospitalization (including imaging, microbiology, ancillary and laboratory) are listed below for reference.    Procedures and Diagnostic  Studies:   ECHOCARDIOGRAM COMPLETE Result Date: 06/16/2024    ECHOCARDIOGRAM REPORT   Patient Name:   RANDAL GOENS [facility] Date of Exam: 06/16/2024 Medical Rec #:  [id number]       Height:       69.0 in Accession #:    [id number]      Weight:       200.0 lb Date of Birth:  March 02, 1941       BSA:          2.066 m Patient Age:    83 years        BP:           155/80 mmHg Patient Gender: M               HR:           106 bpm. Exam Location:  Inpatient Procedure: 2D Echo, Cardiac Doppler and Color Doppler (Both Spectral and Color            Flow Doppler were utilized during procedure). Indications:    Stroke  History:        Patient has prior history of Echocardiogram examinations, most                 recent 04/09/2024. Arrythmias:Atrial Fibrillation; Risk                 Factors:Dyslipidemia and Hypertension.  Sonographer:    Sherlean Dubin Referring Phys: [id number] [facility] [place] IMPRESSIONS  1. Left ventricular ejection fraction, by estimation, is 60 to 65%. The left ventricle has normal function. Left ventricular endocardial border not optimally defined to evaluate regional wall motion. Left ventricular diastolic function could not be evaluated.  2. Right ventricular systolic function is normal. The right ventricular size is normal.  3. The mitral valve is normal in structure. No evidence of mitral valve regurgitation. No evidence of mitral stenosis.  4. The aortic valve is normal in structure. Aortic valve regurgitation is not visualized. No aortic stenosis is present. Comparison(s): No significant change from prior study. FINDINGS  Left Ventricle: Left ventricular  ejection fraction, by estimation, is 60 to 65%. The left ventricle has normal function. Left ventricular endocardial border not optimally defined to evaluate regional wall motion. The left ventricular internal cavity size was normal in size. Suboptimal image quality limits for assessment of left ventricular hypertrophy. Left ventricular diastolic function could not be evaluated. Right Ventricle: The right ventricular size is normal. No increase in right ventricular wall thickness. Right ventricular systolic function is normal. Left Atrium: Left atrial size was not well visualized. Right Atrium: Right atrial size was not well visualized. Pericardium: The pericardium was not well visualized. Mitral Valve: The mitral valve is normal in structure. No evidence of mitral valve regurgitation. No evidence of mitral valve stenosis. Tricuspid Valve: The tricuspid valve is not well visualized. Tricuspid valve regurgitation is not demonstrated. No evidence of tricuspid stenosis. Aortic Valve: The aortic valve is normal in structure. Aortic valve regurgitation is not visualized. No aortic stenosis is present. Aortic valve peak gradient measures 4.6 mmHg. Pulmonic Valve: The pulmonic valve was not well visualized. Pulmonic valve regurgitation is not visualized. Aorta: The aortic root is normal in size and structure. Venous: The inferior vena cava was not well visualized. IAS/Shunts: The interatrial septum was not well visualized.  LEFT VENTRICLE PLAX 2D LVIDd:         4.30 cm   Diastology LVIDs:         2.80 cm  LV e' medial:  8.81 cm/s LV PW:         0.90 cm   LV e' lateral: 14.80 cm/s LV IVS:        0.90 cm LVOT diam:     2.00 cm LV SV:         41 LV SV Index:   20 LVOT Area:     3.14 cm  RIGHT VENTRICLE RV Basal diam:  3.90 cm    PULMONARY VEINS RV Mid diam:    3.20 cm    Diastolic Velocity: 17.70 cm/s RV S prime:     7.07 cm/s  S/D Velocity:       2.20 TAPSE (M-mode): 2.0 cm     Systolic Velocity:  39.00 cm/s LEFT ATRIUM              Index        RIGHT ATRIUM           Index LA diam:        4.30 cm 2.08 cm/m   RA Area:     22.80 cm LA Vol (A2C):   78.8 ml 38.14 ml/m  RA Volume:   58.40 ml  28.27 ml/m LA Vol (A4C):   53.9 ml 26.09 ml/m LA Biplane Vol: 65.3 ml 31.61 ml/m  AORTIC VALVE AV Area (Vmax): 2.31 cm AV Vmax:        107.00 cm/s AV Peak Grad:   4.6 mmHg LVOT Vmax:      78.80 cm/s LVOT Vmean:     55.200 cm/s LVOT VTI:       0.131 m  AORTA Ao Root diam: 3.20 cm Ao Asc diam:  2.90 cm MITRAL VALVE             TRICUSPID VALVE MV Area (PHT): 4.41 cm  TR Peak grad:   11.6 mmHg MV Decel Time: 172 msec  TR Vmax:        170.00 cm/s                           SHUNTS                          Systemic VTI:  0.13 m                          Systemic Diam: 2.00 cm Joelle Azobou Tonleu Electronically signed by Joelle Cedars Tonleu Signature Date/Time: 06/16/2024/4:30:45 PM    Final    CT ANGIO HEAD NECK W WO CM Result Date: 06/15/2024 EXAM: CTA Head and Neck with Intravenous Contrast. CT Head without Contrast. CLINICAL HISTORY: Stroke/TIA, determine embolic source. TECHNIQUE: Axial CTA images of the head and neck performed with intravenous contrast. MIP reconstructed images were created and reviewed. Axial computed tomography images of the head/brain performed without intravenous contrast. Note: Per PQRS, the description of internal carotid artery percent stenosis, including 0 percent or normal exam, is based on [place] American Symptomatic Carotid Endarterectomy Trial (NASCET) criteria. Dose reduction technique was used including one or more of the following: automated exposure control, adjustment of mA and kV according to patient size, and/or iterative reconstruction. CONTRAST: With; 75 mL iohexol  (OMNIPAQUE ) 350 MG/ML injection. COMPARISON: Brain MRI 06/15/2024 redemonstration of multiple posterior circulation infarcts as seen earlier brain MRI. FINDINGS: CT HEAD: BRAIN: Redemonstration of multiple posterior circulation infarcts as seen  earlier brain MRI. No acute intraparenchymal hemorrhage.  No mass lesion. No CT evidence for acute territorial infarct. No midline shift or extra-axial collection. VENTRICLES: No hydrocephalus. ORBITS: The orbits are unremarkable. SINUSES AND MASTOIDS: The paranasal sinuses and mastoid air cells are clear. CTA NECK: COMMON CAROTID ARTERIES: Bilateral carotid bifurcation atherosclerotic calcification without hemodynamically significant stenosis by NASCET criteria. No dissection or occlusion. INTERNAL CAROTID ARTERIES: The internal carotid arteries are widely patent to the skull base. No stenosis by NASCET criteria. No dissection or occlusion. 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 The distal V2 and V3 segments of the right vertebral artery are patent. There is mild atherosclerotic calcification along the V2, V3, and V4 segments of the left vertebral artery. Mild atherosclerosis of the right vertebral artery V4 segment. No dissection. CTA HEAD: ANTERIOR CEREBRAL ARTERIES: No significant stenosis. No occlusion. No aneurysm. MIDDLE CEREBRAL ARTERIES: No significant stenosis. No occlusion. No aneurysm. POSTERIOR CEREBRAL ARTERIES: No significant stenosis. No occlusion. No aneurysm. BASILAR ARTERY: No significant stenosis. No occlusion. No aneurysm. OTHER: There is mild atherosclerotic calcification of the carotid siphons. There is no proximal PICA occlusion. AORTIC ARCH: Atherosclerosis of the aortic arch. SOFT TISSUES: No acute finding. No masses or lymphadenopathy. BONES: No acute osseous abnormality. IMPRESSION: 1. No emergent intracranial large vessel occlusion 2. Redemonstration of multiple posterior circulation infarcts. 3. 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 4. Atherosclerosis of the aortic arch and bilateral carotid bifurcation without hemodynamically significant stenosis by NASCET criteria.  Mild atherosclerotic calcification of the carotid siphons. Electronically signed by: Franky Stanford MD 06/15/2024 07:38 PM EST RP Workstation: [id number]   MR BRAIN WO CONTRAST Result Date: 06/15/2024 EXAM: MRI BRAIN WITHOUT CONTRAST 06/15/2024 04:29:47 PM TECHNIQUE: Multiplanar multisequence MRI of the head/brain was performed without the administration of intravenous contrast. COMPARISON: Head CT 06/15/2024 and MRI 10/30/2005. CLINICAL HISTORY: Transient ischemic attack (TIA); acute vertigo, hx of Afib, not anticoagulated. FINDINGS: BRAIN AND VENTRICLES: There is a large acute to early subacute right cerebellar infarct in the PICA territory with cytotoxic edema partially effacing the fourth ventricle without evidence of obstructive hydrocephalus. Two adjacent subcentimeter acute to early subacute infarcts are also present in the left temporo-occipital region. There is a large subacute right PCA infarct involving the posterior temporal and occipital lobes which is older than the aforementioned infarcts and has associated petechial hemorrhage. There is mild mass effect on the temporal horn of the right lateral ventricle. There are also older, small subacute infarcts in the left cerebellar hemisphere with associated petechial hemorrhage. Scattered small T2 hyperintensities elsewhere in the cerebral white matter bilaterally are nonspecific but compatible with mild chronic small vessel ischemic disease. There is mild cerebral atrophy. No mass, midline shift, or extra-axial fluid collection is identified. Major intracranial vascular flow voids are preserved. ORBITS: Bilateral cataract extraction. SINUSES AND MASTOIDS: Minimal mucosal thickening in the paranasal sinuses. Trace bilateral mastoid fluid. BONES AND SOFT TISSUES: Normal marrow signal. No acute soft tissue abnormality. IMPRESSION: 1. Multiple posterior circulation infarcts of varying ages. 2. Large acute to early subacute right cerebellar infarct and small  acute to early subacute infarcts in the left temporo-occipital region. 3. Large, older subacute right PCA infarct. 4. Small late subacute infarcts in the left cerebellum. Electronically signed by: Dasie Hamburg MD 06/15/2024 05:24 PM EST RP Workstation: [id number]   CT Head Wo Contrast Result Date: 06/15/2024 EXAM: CT HEAD WITHOUT CONTRAST 06/15/2024 01:06:00 PM TECHNIQUE: CT of the head was performed without the administration  of intravenous contrast. Automated exposure control, iterative reconstruction, and/or weight based adjustment of the mA/kV was utilized to reduce the radiation dose to as low as reasonably achievable. COMPARISON: None available. CLINICAL HISTORY: Vertigo, central. FINDINGS: BRAIN AND VENTRICLES: Low density noted in the posterior medial right temporal lobe, right occipital lobe, and right cerebellum as well as potentially within the brainstem compatible with acute to subacute infarcts. Old infarcts in the left cerebellum. No acute hemorrhage. No hydrocephalus. No extra-axial collection. No mass effect or midline shift. ORBITS: No acute abnormality. SINUSES: No acute abnormality. SOFT TISSUES AND SKULL: No acute soft tissue abnormality. No skull fracture. IMPRESSION: 1. Acute to subacute infarcts in the posterior medial right temporal lobe, right occipital lobe, right cerebellum, and potentially within the brainstem. Recommend MRI for further evaluation. Electronically signed by: Franky Crease MD 06/15/2024 01:52 PM EST RP Workstation: [id number]     Labs:   Basic Metabolic Panel: Recent Labs  Lab 06/15/24 1152 06/15/24 1202 06/15/24 1935 06/16/24 0137 06/17/24 0248 06/18/24 0817  NA 138 141  --  140 140 139  K 4.5 4.1  --  4.2 3.8 4.1  CL 104 104  --  102 107 104  CO2 21*  --   --  24 27 27   GLUCOSE 127* 129*  --  117* 110* 115*  BUN 37* 36*  --  27* 28* 25*  CREATININE 1.29* 1.30* 1.26* 1.27* 1.50* 1.49*  CALCIUM  9.2  --   --  9.0 8.5* 8.8*  MG  --   --   --  1.8  --    --    GFR Estimated Creatinine Clearance: 41.8 mL/min (A) (by C-G formula based on SCr of 1.49 mg/dL (H)). Liver Function Tests: Recent Labs  Lab 06/15/24 1152  AST 25  ALT 19  ALKPHOS 84  BILITOT 1.0  PROT 6.6  ALBUMIN 3.6   No results for input(s): LIPASE, AMYLASE in the last 168 hours. No results for input(s): AMMONIA in the last 168 hours. Coagulation profile Recent Labs  Lab 06/15/24 1152  INR 1.0    CBC: Recent Labs  Lab 06/15/24 1152 06/15/24 1202 06/15/24 1935 06/16/24 0137 06/17/24 0248  WBC 17.8*  --  16.2* 15.9* 11.4*  NEUTROABS 14.6*  --   --   --  7.6  HGB 14.8 15.3 14.6 14.3 13.4  HCT 44.0 45.0 42.5 42.3 39.8  MCV 86.4  --  84.8 85.6 85.8  PLT 134*  --  155 153 160   Cardiac Enzymes: Recent Labs  Lab 06/15/24 1152  CKTOTAL 109   BNP: Invalid input(s): POCBNP CBG: No results for input(s): GLUCAP in the last 168 hours. D-Dimer No results for input(s): DDIMER in the last 72 hours. Hgb A1c No results for input(s): HGBA1C in the last 72 hours. Lipid Profile No results for input(s): CHOL, HDL, LDLCALC, TRIG, CHOLHDL, LDLDIRECT in the last 72 hours. Thyroid  function studies No results for input(s): TSH, T4TOTAL, T3FREE, THYROIDAB in the last 72 hours.  Invalid input(s): FREET3 Anemia work up No results for input(s): VITAMINB12, FOLATE, FERRITIN, TIBC, IRON, RETICCTPCT in the last 72 hours. Microbiology No results found for this or any previous visit (from the past 240 hours).  Time coordinating discharge: 45 minutes  Signed: Lillyahna Hemberger  [facility] 06/21/2024, 1:23 PM

## 2024-06-21 NOTE — H&P (Signed)
 Physical Medicine and Rehabilitation Admission H&P        Chief Complaint  Patient presents with   Functional decline due to stroke.       HPI:  Dustin Ashley is an 83 year old RH-male with history of CAD, CKD II, PAF, PE/DVT, HOH, RAS who was admitted on 06/15/24 with sudden onset of N/V with vertigo night prior to admission and was on floor all right till next day when he was able to finally crawl and call family. CT head done revealing acute to subacute infarcts in posterior medial right temporal, right occipital, right cerebellum and potentially within brainstem. CTA head/neck showed multiple PCA infarcts, negative for LVO and occlusion of right VA V1 and V2 segments with reconstitution of distal segments. WBC elevated at 18.8 and BNP 242.6. MRI brain showed multiple PCA infarcts of varying age, large acute to subacute right cerebellar, left temporo-occipital lobe and large older subacute R-PCA infarct and small late subacute infarcts in left cerebellum. .    Stroke felt to be in setting of A fib and non compliance with eliquis . Plavix  added briefly with Follow up CT head done 12/05 showing mild worsening of edema with petechial hemorrhage and mildly increased effacement of 4th ventricle with slight dilatation of lateral and third ventricles question early hydrocephalus. He was transitioned to Eliquis  and Plavix  discontinued.  2 D echo showed EF 60-65%. Follow up labs showed acute on chronic renal failure He continues to be limited by dizzines and balance deficits. PT/OT/ST has been working with patient who requires min to mod assist with mobility, min assist with ADLs and mild cognitive linguistic deficits noted with difficulty following multistep commands.  [name] He lives alone and was independent PTA. Family lives near by and will assist after discharge.      Review of Systems  HENT:  Positive for hearing loss.   Eyes:  Negative for blurred vision.  Respiratory:  Negative for cough.    Cardiovascular:  Negative for chest pain.  Gastrointestinal:  Positive for constipation. Negative for heartburn.  Genitourinary:  Negative for frequency.  Musculoskeletal:  Positive for neck pain.  Neurological:  Positive for weakness and headaches. Negative for dizziness.  Psychiatric/Behavioral:  The patient is not nervous/anxious and does not have insomnia.            Past Medical History:  Diagnosis Date   Allergic rhinitis     Allergy      seasonal   Arthritis     Bell palsy     CAD (coronary artery disease)     Cataract     Chronic renal insufficiency, stage II (mild)      Cr ~ 1.4, u/s 4-12 normal kidneys   Gallstone pancreatitis 2015   GERD (gastroesophageal reflux disease)     HOH (hard of hearing)      has a hearing aid L, sees audiology rountinely   Hyperglycemia      A1C 5.8 04-2010   Hyperlipemia     Hypertension     Pain, joint, multiple sites      uses valium , occ uses for insomnia. uses for shoulder  pain   Paroxysmal atrial fibrillation (HCC)     Personal history of colonic polyps     Pulmonary emboli (HCC)      02-2014   RAS (renal artery stenosis) 05-2012    Renal insufficiency     SCC (squamous cell carcinoma)  Past Surgical History:  Procedure Laterality Date   CARDIAC CATHETERIZATION       CATARACT EXTRACTION       CHOLECYSTECTOMY N/A 02/15/2014    Procedure: LAPAROSCOPIC CHOLECYSTECTOMY with IOC;  Surgeon: Camellia CHRISTELLA Blush, MD;  Location[facility];  Service: General;  Laterality: N/A;   COLONOSCOPY       CORONARY ARTERY BYPASS GRAFT   1995   ESOPHAGOGASTRODUODENOSCOPY N/A 03/02/2014    Procedure: ESOPHAGOGASTRODUODENOSCOPY (EGD);  Surgeon: Gwendlyn ONEIDA Buddy, MD;  Location: [facility] ENDOSCOPY;  Service: Endoscopy;  Laterality: N/A;  sarah/leone               Family History  Problem Relation Age of Onset   Hypertension Father     Heart attack Brother          2 brothers, CABG   Diabetes Neg Hx     Colon cancer Neg Hx     Prostate  cancer Neg Hx          colon, prostate   Rectal cancer Neg Hx     Stomach cancer Neg Hx            Social History:  reports that he quit smoking about 48 years ago. His smoking use included cigarettes. He has never used smokeless tobacco. He reports that he does not drink alcohol and does not use drugs.     Allergies       Allergies  Allergen Reactions   Hydrocodone -Acetaminophen  Other (See Comments)      REACTION: bladder obstruction   Tramadol Hcl Other (See Comments)      REACTION: bladder obstruction              Medications Prior to Admission  Medication Sig Dispense Refill   amLODipine  (NORVASC ) 5 MG tablet Take 1 tablet (5 mg total) by mouth daily. 180 tablet 3   apixaban  (ELIQUIS ) 2.5 MG TABS tablet Take 1 tablet (2.5 mg total) by mouth 2 (two) times daily. (Patient not taking: No sig reported) 60 tablet 0   atenolol  (TENORMIN ) 50 MG tablet Take 1.5 tablets (75 mg total) by mouth daily. 135 tablet 1   atorvastatin  (LIPITOR) 20 MG tablet TAKE 1 TABLET BY MOUTH EVERY DAY 90 tablet 1   benazepril  (LOTENSIN ) 40 MG tablet Take 0.5 tablets (20 mg total) by mouth 2 (two) times daily.       benzonatate  (TESSALON  PERLES) 100 MG capsule Take 1 capsule (100 mg total) by mouth 3 (three) times daily as needed for cough. 21 capsule 0   diazepam  (VALIUM ) 10 MG tablet Take 1 tablet (10 mg total) by mouth every 12 (twelve) hours as needed for anxiety or sleep. 60 tablet 3   fexofenadine (ALLEGRA) 180 MG tablet Take 180 mg by mouth daily.              Home: Home Living Family/patient expects to be discharged to:: Private residence Living Arrangements: Alone Available Help at Discharge: Family, Friend(s), Available 24 hours/day Type of Home: House Home Access: Stairs to enter [employer] of Steps: 5-6 Entrance Stairs-Rails: Left Home Layout: Two level, Able to live on main level with bedroom/bathroom, 1/2 bath on main level Alternate Level Stairs-Number of Steps:  14 Alternate Level Stairs-Rails: Right Bathroom Shower/Tub: Engineer, Manufacturing Systems: Standard Home Equipment: [employer] (2 wheels), [employer] - single point, BSC/3in1 Additional Comments: Pt will either d/c to his home (above) or his son's  Lives With: Alone   Functional History: Prior Function Prior  Level of Function : Independent/Modified Independent, Driving Mobility Comments: no AD PTA ADLs Comments: indep, driving, manages medications, cooks and does his own laundry   Functional Status:  Mobility: Bed Mobility Overal bed mobility: Needs Assistance Bed Mobility: Supine to Sit Rolling: Supervision, Used rails Sidelying to sit: Supervision, Used rails General bed mobility comments: not assessed - pt received with PT on arrival and left in chair upon OT exit Transfers Overall transfer level: Needs assistance Equipment used: Rolling walker (2 wheels) Transfers: Bed to chair/wheelchair/BSC Sit to Stand: Min assist, Contact guard assist Bed to/from chair/wheelchair/BSC transfer type:: Step pivot Step pivot transfers: Min assist General transfer comment: Min A for reduced coordinated movements when changing directions,  approaching surfaces and navigating obstacles. Ambulation/Gait Ambulation/Gait assistance: Min assist, Mod assist, Contact guard assist Gait Distance (Feet): 80 Feet (x4) Assistive device: Rolling walker (2 wheels), 1 person hand held assist Gait Pattern/deviations: Step-through pattern, Decreased stride length, Drifts right/left, Trunk flexed General Gait Details: MinA/CGA when ambulating with use of RW with heavy reliance on UE support. Up to [facility] when ambulating with 1HH for unsteadiness and increased postural sway. Reported 6/10 dizziness when ambulating, cues for gaze stabilization Gait velocity: decr   ADL: ADL Overall ADL's : Needs assistance/impaired Eating/Feeding: Minimal assistance, Sitting Grooming: Contact guard assist, Standing, Wash/dry  hands Grooming Details (indicate cue type and reason): pt improperly judging power needed for water pressure, cued to locate paper towel dispenser Upper Body Dressing : Minimal assistance Lower Body Dressing: Minimal assistance, Sitting/lateral leans Lower Body Dressing Details (indicate cue type and reason): cued for figure-4 technique to prevent further dizziness, pt with incr effort to don socks d/t reduced flexibility Toilet Transfer: Minimal assistance, Ambulation, Regular Toilet, Rolling walker (2 wheels) Toilet Transfer Details (indicate cue type and reason): VC for safe approach and reaching for GBs to assist Toileting- Clothing Manipulation and Hygiene: Supervision/safety, Sitting/lateral lean Toileting - Clothing Manipulation Details (indicate cue type and reason): anterior peri hygiene seated on toilet Functional mobility during ADLs: Minimal assistance, Contact guard assist, Rolling walker (2 wheels) General ADL Comments: Pt engaged in IADL task of folding laundry at the sink. Encouraged to focus on one washcloth at a time, challenging dynamic balance with frequent truncal sway, occasionally needing UE support from vanity and min A to stabilize self.   Cognition: Cognition Overall Cognitive Status: Impaired/Different from baseline Arousal/Alertness: Awake/alert Orientation Level: Oriented X4 Year: 2025 Month: December Day of Week: Correct Attention: Focused, Sustained, Selective Focused Attention: Appears intact Sustained Attention: Appears intact Selective Attention: Appears intact Memory: Impaired Memory Impairment: Decreased short term memory, Other (comment) Decreased Short Term Memory: Verbal basic, Functional basic Awareness: Impaired Awareness Impairment: Intellectual impairment Problem Solving: Impaired Problem Solving Impairment: Verbal basic, Functional basic Executive Function: Self Monitoring, Self Correcting Self Monitoring: Impaired Self Monitoring  Impairment: Verbal basic, Functional basic Self Correcting: Impaired Self Correcting Impairment: Verbal basic, Functional basic Safety/Judgment: Appears intact Cognition Arousal: Alert Behavior During Therapy: WFL for tasks assessed/performed Overall Cognitive Status: Impaired/Different from baseline     Blood pressure (!) 143/65, pulse 83, temperature 97.7 F (36.5 C), temperature source Oral, resp. rate 19, height 5' 9 (1.753 m), weight 90.7 kg, SpO2 96%. Physical Exam Vitals and nursing note reviewed.  Constitutional:      General: He is not in acute distress.    Appearance: Normal appearance. He is not ill-appearing.  HENT:     Head: Normocephalic and atraumatic.     Right Ear: External ear normal.  Left Ear: External ear normal.     Nose: Nose normal.     Mouth/Throat:     Mouth: Mucous membranes are moist.     Pharynx: Oropharynx is clear.  Eyes:     Extraocular Movements: Extraocular movements intact.     Conjunctiva/sclera: Conjunctivae normal.     Pupils: Pupils are equal, round, and reactive to light.  Cardiovascular:     Rate and Rhythm: Normal rate and regular rhythm.     Pulses: Normal pulses.     Heart sounds: No murmur heard.    No gallop.  Pulmonary:     Effort: Pulmonary effort is normal. No respiratory distress.     Breath sounds: Normal breath sounds. No wheezing.  Abdominal:     General: Bowel sounds are normal. There is no distension.     Palpations: Abdomen is soft.     Tenderness: There is no abdominal tenderness.  Musculoskeletal:        General: No swelling.     Cervical back: Normal range of motion.     Right lower leg: No edema.     Left lower leg: Edema present.  Skin:    General: Skin is warm and dry.     Findings: Bruising present.  Neurological:     Mental Status: He is alert and oriented to person, place, and time.     Comments: HOH. Able to answer orientation questions and follow simple motor commands. Oriented to person, place,  reason, month, day, year, day of week. Provided me his home address. Normal language and speech. CN exam non-focal. MMT: 4 to 4+/5 prox to distal in BUE. 4- to 4/5 prox to distal in BLE. Sensory exam normal for light touch and pain in all 4 limbs. No limb ataxia or cerebellar signs but slight impairments in [facility]. No abnormal tone appreciated.    Psychiatric:        Mood and Affect: Mood normal.        Behavior: Behavior normal.       Lab Results Last 48 Hours  No results found for this or any previous visit (from the past 48 hours).   Imaging Results (Last 48 hours)  No results found.         Blood pressure (!) 143/65, pulse 83, temperature 97.7 F (36.5 C), temperature source Oral, resp. rate 19, height 5' 9 (1.753 m), weight 90.7 kg, SpO2 96%.   Medical Problem List and Plan: 1. 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  Antithrombotics: -DVT/anticoagulation:  Pharmaceutical: Eliquis              -antiplatelet therapy: ASA 3. Pain Management: Tylenol  prn.  4. Mood/Behavior/Sleep: LCSW to follow for evaluation and support.              -antipsychotic agents: N/A             --Hx of insomnia/LBP and on valium  bid prn.  5. Neuropsych/cognition: This patient is capable of making decisions on his own behalf. 6. Skin/Wound Care: Routine pressure relief measures.  7. Fluids/Electrolytes/Nutrition: Monitor I/O.  8.  Acute on chronic  renal failure: Encourage fluid intake. SCr 1.3 @ admission and trending up to 1.46              --recheck renal status in am.  9. CAD s/p: Has hx of presyncope in the past. Continue Lipitor and ASA.              --monitor of orthostatic symptoms also.  10.  HTN:  Monitor BP TID--on Amlodipine  10 mg, Lotensin  20 mg bid, Hydralazine  25 mg TID, Tenormin  75 mg              -bp currently controlled 11. PAF: Rate controlled and monitor for symptoms with increase in activity. --on atenolol  daily 12. Vestibular dysfunction:Dizziness: Therapy to continue to work on VOR. --Meclizine  prn.       Sharlet GORMAN Schmitz, PA-C 06/21/2024  I have personally performed a face to face diagnostic evaluation of this patient and formulated the key components of the plan.  Additionally, I have personally reviewed laboratory data, imaging studies, as well as relevant notes and concur with the physician assistant's documentation above.  The patient's status has not changed from the original H&P.  Any changes in documentation from the acute care chart have been noted above.  Arthea IVAR Gunther, MD, LEELLEN

## 2024-06-21 NOTE — Progress Notes (Signed)
 Dustin Ashley  Dustin Ashley  DOB: February 05, 1941  PCP: Amon Aloysius BRAVO, MD FMW:[id number]  DOA: 06/15/2024  LOS: 6 days  Hospital Day: 7  Subjective: Patient was seen and examined this morning. Lying in bed.  Not in distress. Tolerated regular diet  Afebrile, blood pressure between 120s to 140s, breathing on room air No recent labs since 12/5  Brief narrative: Dustin Ashley is a 83 y.o. male with PMH significant for HTN, HLD, CAD s/p CABG, paroxysmal A-fib, renal artery stenosis, arthritis, GERD 12/2, patient presented to ED with sudden onset of dizziness associated with nausea and multiple episodes of vomiting leading to a fall.  On initial exam, patient did not have any focal neurological deficit.  CT head showed an acute infarct in the posterior middle right temporal lobe occipital lobe and right cerebellum.  Code stroke was called  Seen by neurology  Admitted to [facility]  Stroke workup completed  MRI of the brain showed multiple posterior circulation infarct of varying ages with a large acute to early subacute right cerebellar infarct and acute to early subacute infarct in the left temporal parietal region and large subacute right PCA infarct.  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 PTA meds- aspirin  81 mg daily Per neurology recommendation, currently on a combination of aspirin  81 mg daily and Eliquis  2.5 mg twice daily Seen by PT/OT.  CIR recommended Meclizine  as needed dizziness Advance to regular diet yesterday which he has been  tolerating.  A-fib Currently on atenolol   previously not on anticoagulation. After the discussion the patient by previous providers, he was started on Eliquis  high risk of recurrent stroke  Hypertension Currently blood pressure is controlled on amlodipine  10 mg daily, atenolol  75 mg daily, benazepril  20 mg twice daily, hydralazine  25 mg 3 times daily  HLD Statin  H/o CAD s/p CABG Continue aspirin  and statin  CKD 3B Renal artery stenosis Creatinine at baseline Recent Labs    10/13/23 0930 02/12/24 1151 04/12/24 1014 06/15/24 1152 06/15/24 1202 06/15/24 1935 06/16/24 0137 06/17/24 0248 06/18/24 0817  BUN 25* 27* 34* 37* 36*  --  27* 28* 25*  CREATININE 1.65* 1.40 1.62* 1.29* 1.30* 1.26* 1.27* 1.50* 1.49*   H/o insomnia Continue diazepam .    Impaired mobility  Seen by PT.  CIR recommended PT Follow up Rec: Acute Inpatient Rehab (3hours/Day)06/18/2024 1653    Goals of care   Code Status: Full Code     DVT prophylaxis:  apixaban  (ELIQUIS ) tablet 2.5 mg Start: 06/18/24 2200 apixaban  (ELIQUIS ) tablet 2.5 mg   Antimicrobials: None Fluid: None Consultants: Neurology Family Communication: Family at bedside  Status: Inpatient Level of care:  Telemetry   Patient is from: Home Needs to continue in-hospital care: Medically stable for discharge.  Pending CIR   Diet:  Diet Order             Diet regular Room service appropriate? Yes; Fluid consistency: Thin  Diet effective now                   Scheduled Meds:  amLODipine   10  mg Oral Daily   apixaban   2.5 mg Oral BID   aspirin  EC  81 mg Oral Daily   atenolol   75 mg Oral Daily   atorvastatin   20 mg Oral Daily   benazepril   20 mg Oral BID   hydrALAZINE   25 mg Oral TID    PRN meds: [DISCONTINUED] acetaminophen  **OR** [DISCONTINUED] acetaminophen  (TYLENOL ) oral liquid 160 mg/5 mL **OR** acetaminophen , acetaminophen , diazepam , hydrALAZINE , meclizine , oxyCODONE , senna-docusate   Infusions:     Antimicrobials: Anti-infectives (From admission, onward)    None       Objective: Vitals:   06/21/24 0739 06/21/24 1111  BP: (!) 143/65 136/68  Pulse: 83 72  Resp: 19 17  Temp: 97.7 F (36.5 C) 98.1 F (36.7 C)  SpO2: 96% 95%   No intake or output data in the 24 hours ending 06/21/24 1204  Filed Weights   06/15/24 1114  Weight: 90.7 kg   Weight change:  Body mass index is 29.53 kg/m.   Physical Exam: General exam: Pleasant, elderly Caucasian male Skin: No rashes, lesions or ulcers. HEENT: Atraumatic, normocephalic, no obvious bleeding Lungs: Clear to auscultation bilaterally,  CVS: S1, S2, no murmur,   GI/Abd: Soft, nontender, nondistended, bowel sound present,   CNS: Alert, awake, oriented x 3 Psychiatry: Mood appropriate Extremities: No pedal edema, no calf tenderness,   Data Review: I have personally reviewed the laboratory data and studies available.  F/u labs ordered Unresulted Labs (From admission, onward)     Start     Ordered   06/15/24 1058  Urinalysis, w/ Reflex to Culture (Infection Suspected) -Urine, Clean Catch  Once,   URGENT       Question:  Specimen Source  Answer:  Urine, Clean Catch   06/15/24 1057   06/15/24 1058  Urine rapid drug screen (hosp performed)  Once,   STAT        06/15/24 1057            Signed, Chapman Rota, MD [facility] 06/21/2024

## 2024-06-21 NOTE — Progress Notes (Signed)
 PMR Admission Coordinator Pre-Admission Assessment   Patient: Dustin Ashley is an 83 y.o., male MRN: [id number] DOB: [birth year] Height: 5' 9 (175.3 cm) Weight: 90.7 kg   Insurance Information HMO:     PPO:   yes   PCP:      IPA:      80/20:      OTHER:  PRIMARY: U[employer] Medicare      Policy#: [id number]      Subscriber: self CM Name: UM dept      Phone#: [phone]     Fax#: [phone] Pre-Cert#: [id number] I received  auth for CIR from Shephanie with [facility] Medicare for admit 06/21/24 through 06/28/24.  Updates due to UM dept on 06/28/24 at fax listed above.       Employer: Retired Benefits:  Phone #: [phone]     Name:  Eustacio Date: 07/16/2023 - 07/14/2024 Deductible: $0 (does not have deductible) OOP Max: $3,900 ($521.43 met) CIR: $300/day co-pay for days 1-5, $0/day co-pay for days 6+ SNF: $0.00 Copayment per day for days 1-20; $203 Copayment per day for days 21-100 for Medicare-covered care/maximum 100 days/benefit period Outpatient: $20 copay/visit Home Health:  100% coverage; limited by medical necessity DME: 80% coverage; 20% co-insurance SECONDARY:       Policy#:      Phone#:    Artist:       Phone#:    The Engineer, Materials Information Summary" for patients in Inpatient Rehabilitation Facilities with attached "Privacy Act Statement-Health Care Records" was provided and verbally reviewed with: Patient   Emergency Contact Information Contact Information       Name Relation Home Work [place] Son [phone]   [phone]    Jourdan, Durbin     [phone]         Other Contacts       Name Relation Home Work Mobile    Volland,Wanda Relative     [phone]           Current Medical History  Patient Admitting Diagnosis: R CVA   History of Present Illness: REXTON GREULICH is an 83 year old RH-male with history of CAD, CKD II, PAF, PE/DVT, HOH, RAS who was admitted on 06/15/24 with sudden onset of N/V with vertigo night prior to  admission and was on floor all right till next day when he was able to finally crawl and call family. CT head done revealing acute to subacute infarcts in posterior medial right temporal, right occipital, right cerebellum and potentially within brainstem. CTA head/neck showed multiple PCA infarcts, negative for LVO and occlusion of right VA V1 and V2 segments with reconstitution of distal segments. WBC elevated at 18.8 and BNP 242.6. MRI brain showed multiple PCA infarcts of varying age, large acute to subacute right cerebellar, left temporo-occipital lobe and large older subacute R-PCA infarct and small late subacute infarcts in left cerebellum. .    Stroke felt to be in setting of A fib and non compliance with eliquis . Plavix  added briefly with Follow up CT head done 12/05 showing mild worsening of edema with petechial hemorrhage and mildly increased effacement of 4th ventricle with slight dilatation of lateral and third ventricles question early hydrocephalus. He was transitioned to Eliquis  and Plavix  discontinued.  2 D echo showed EF 60-65%. Follow up labs showed acute on chronic renal failure He continues to be limited by dizzines and balance deficits. PT/OT/ST has been working with patient who requires min to mod assist with mobility, min  assist with ADLs and mild cognitive linguistic deficits noted with difficulty following multistep commands.  [name] He lives alone and was independent PTA. Family lives near by and will assist after discharge.      Complete NIHSS TOTAL: 0   Patient's medical record from [facility] has been reviewed by the rehabilitation admission coordinator and physician.   Past Medical History      Past Medical History:  Diagnosis Date   Allergic rhinitis     Allergy      seasonal   Arthritis     Bell palsy     CAD (coronary artery disease)     Cataract     Chronic renal insufficiency, stage II (mild)      Cr ~ 1.4, u/s 4-12 normal kidneys   Gallstone pancreatitis  2015   GERD (gastroesophageal reflux disease)     HOH (hard of hearing)      has a hearing aid L, sees audiology rountinely   Hyperglycemia      A1C 5.8 04-2010   Hyperlipemia     Hypertension     Pain, joint, multiple sites      uses valium , occ uses for insomnia. uses for shoulder  pain   Paroxysmal atrial fibrillation [facility])     Personal history of colonic polyps     Pulmonary emboli (HCC)      02-2014   RAS (renal artery stenosis) 05-2012    Renal insufficiency     SCC (squamous cell carcinoma)            Has the patient had major surgery during 100 days prior to admission? No   Family History   family history includes Heart attack in his brother; Hypertension in his father.   Current Medications  Current Medications    Current Facility-Administered Medications:    [DISCONTINUED] acetaminophen  (TYLENOL ) tablet 650 mg, 650 mg, Oral, Q4H PRN, 650 mg at 06/15/24 1747 **OR** [DISCONTINUED] acetaminophen  (TYLENOL ) 160 MG/5ML solution 650 mg, 650 mg, Per Tube, Q4H PRN **OR** acetaminophen  (TYLENOL ) suppository 650 mg, 650 mg, Rectal, Q4H PRN, Pokhrel, Laxman, MD   acetaminophen  (TYLENOL ) tablet 650 mg, 650 mg, Oral, Q6H PRN, Pokhrel, Laxman, MD, 650 mg at 06/17/24 0920   amLODipine  (NORVASC ) tablet 5 mg, 5 mg, Oral, Daily, Pahwani, Ravi, MD, 5 mg at 06/17/24 0919   aspirin  EC tablet 81 mg, 81 mg, Oral, Daily, Pahwani, Ravi, MD, 81 mg at 06/17/24 0920   atenolol  (TENORMIN ) tablet 75 mg, 75 mg, Oral, Daily, Pahwani, Ravi, MD, 75 mg at 06/17/24 9080   atorvastatin  (LIPITOR) tablet 20 mg, 20 mg, Oral, Daily, Pokhrel, Laxman, MD, 20 mg at 06/17/24 0920   clopidogrel  (PLAVIX ) tablet 75 mg, 75 mg, Oral, Daily, Prunty, Donald B, DO, 75 mg at 06/17/24 0920   diazepam  (VALIUM ) tablet 10 mg, 10 mg, Oral, Q12H PRN, Pokhrel, Laxman, MD   enoxaparin  (LOVENOX ) injection 40 mg, 40 mg, Subcutaneous, Q24H, Pokhrel, Laxman, MD, 40 mg at 06/16/24 1818   hydrALAZINE  (APRESOLINE ) injection 10 mg, 10  mg, Intravenous, Q6H PRN, Vernon Ranks, MD   meclizine  (ANTIVERT ) tablet 25 mg, 25 mg, Oral, TID PRN, Pokhrel, Laxman, MD, 25 mg at 06/16/24 0741   oxyCODONE  (Oxy IR/ROXICODONE ) immediate release tablet 2.5 mg, 2.5 mg, Oral, Q6H PRN, Pokhrel, Laxman, MD, 2.5 mg at 06/15/24 1856   senna-docusate (Senokot-S) tablet 1 tablet, 1 tablet, Oral, QHS PRN, Pokhrel, Laxman, MD     Patients Current Diet:  Diet Order  DIET DYS 3 Room service appropriate? Yes; Fluid consistency: Thin  Diet effective now                         Precautions / Restrictions Precautions Precautions: Fall Precaution/Restrictions Comments: HoH Restrictions Weight Bearing Restrictions Per Provider Order: No    Has the patient had 2 or more falls or a fall with injury in the past year? No   Prior Activity Level Community (5-7x/wk): Went out most days.  Was independent, driving.   Prior Functional Level Self Care: Did the patient need help bathing, dressing, using the toilet or eating? Independent   Indoor Mobility: Did the patient need assistance with walking from room to room (with or without device)? Independent   Stairs: Did the patient need assistance with internal or external stairs (with or without device)? Independent   Functional Cognition: Did the patient need help planning regular tasks such as shopping or remembering to take medications? Independent   Patient Information Are you of Hispanic, Latino/a,or Spanish origin?: A. No, not of Hispanic, Latino/a, or Spanish origin What is your race?: A. White Do you need or want an interpreter to communicate with a doctor or health care staff?: 0. No   Patient's Response To:  Health Literacy and Transportation Is the patient able to respond to health literacy and transportation needs?: Yes Health Literacy - How often do you need to have someone help you when you read instructions, pamphlets, or other written material from your doctor or  pharmacy?: Often In the past 12 months, has lack of transportation kept you from medical appointments or from getting medications?: No In the past 12 months, has lack of transportation kept you from meetings, work, or from getting things needed for daily living?: No   Home Assistive Devices / Equipment Home Equipment: Agricultural Consultant (2 wheels), [employer] - single point, BSC/3in1   Prior Device Use: Indicate devices/aids used by the patient prior to current illness, exacerbation or injury? None of the above   Current Functional Level Cognition   Arousal/Alertness: Awake/alert Overall Cognitive Status: Impaired/Different from baseline Orientation Level: Oriented X4 Attention: Focused, Sustained, Selective Focused Attention: Appears intact Sustained Attention: Appears intact Selective Attention: Appears intact Memory: Impaired Memory Impairment: Decreased short term memory, Other (comment) Decreased Short Term Memory: Verbal basic, Functional basic Awareness: Impaired Awareness Impairment: Intellectual impairment Problem Solving: Impaired Problem Solving Impairment: Verbal basic, Functional basic Executive Function: Self Monitoring, Self Correcting Self Monitoring: Impaired Self Monitoring Impairment: Verbal basic, Functional basic Self Correcting: Impaired Self Correcting Impairment: Verbal basic, Functional basic Safety/Judgment: Appears intact    Extremity Assessment (includes Sensation/Coordination)   Upper Extremity Assessment: Overall WFL for tasks assessed  Lower Extremity Assessment: Defer to PT evaluation     ADLs   Overall ADL's : Needs assistance/impaired Eating/Feeding: Minimal assistance, Sitting Grooming: Minimal assistance, Standing, Wash/dry hands Grooming Details (indicate cue type and reason): sinkside, needs  A for proper judgement of power for soap dispenser and sink faucet (did not correct extremely high water pressure from faucet) Upper Body Dressing : Minimal  assistance Lower Body Dressing: Maximal assistance, Sitting/lateral leans Toilet Transfer: Minimal assistance, Ambulation, Regular Toilet, Rolling walker (2 wheels), Grab bars, Moderate assistance Toilet Transfer Details (indicate cue type and reason): VC to use GB's, cues for safe approach to toilet Toileting- Clothing Manipulation and Hygiene: Minimal assistance, Cueing for sequencing, Sitting/lateral lean Toileting - Clothing Manipulation Details (indicate cue type and reason): sequencing cues, assist to locate toilet  paper dispenser Functional mobility during ADLs: Moderate assistance, +2 for physical assistance, +2 for safety/equipment, Rolling walker (2 wheels)     Mobility   Overal bed mobility: Needs Assistance Bed Mobility: Supine to Sit Rolling: Supervision, Used rails Sidelying to sit: Supervision, Used rails General bed mobility comments: increased time and effort     Transfers   Overall transfer level: Needs assistance Equipment used: Rolling walker (2 wheels) Transfers: Sit to/from Stand Sit to Stand: Min assist, Contact guard assist Bed to/from chair/wheelchair/BSC transfer type:: Step pivot Step pivot transfers: Mod assist General transfer comment: STS with min A progressing to CGA from recliner without UE support and cues for anterior weight shift     Ambulation / Gait / Stairs / Wheelchair Mobility   Ambulation/Gait Ambulation/Gait assistance: Min assist, +2 safety/equipment Gait Distance (Feet): 100 Feet (x2) Assistive device: Rolling walker (2 wheels) Gait Pattern/deviations: Step-through pattern, Decreased stride length, Drifts right/left, Trunk flexed General Gait Details: Pt demonstrates unsteadiness and reliance on RW support and min A for balance. Pt demonstrates low foot clearance, but is able to slightly improve with cues. chair follow for safety, but pt only requiring a standing rest break     Posture / Balance Dynamic Sitting Balance Sitting balance -  Comments: EOB Balance Overall balance assessment: Needs assistance Sitting-balance support: No upper extremity supported, Feet supported Sitting balance-Leahy Scale: Fair Sitting balance - Comments: EOB Postural control: Right lateral lean Standing balance support: Bilateral upper extremity supported, During functional activity, Reliant on assistive device for balance Standing balance-Leahy Scale: Poor Standing balance comment: reliant on RW and external assist     Special considerations/life events  Special service needs none    Previous Home Environment (from acute therapy documentation) Living Arrangements: Alone  Lives With: Alone Available Help at Discharge: Family, Friend(s), Available 24 hours/day Type of Home: House Home Layout: Two level, Able to live on main level with bedroom/bathroom, 1/2 bath on main level Alternate Level Stairs-Rails: Right Alternate Level Stairs-Number of Steps: 14 Home Access: Stairs to enter Entrance Stairs-Rails: Left Entrance Stairs-Number of Steps: 5-6 Bathroom Shower/Tub: Engineer, Manufacturing Systems: Standard Home Care Services: No Additional Comments: Pt will either d/c to his home (above) or his son's   Discharge Living Setting Plans for Discharge Living Setting: Patient's home, House, Alone (Lived alone PTA.  Son and family to assist with 24/7 supervision) Type of Home at Discharge: House Discharge Home Layout: Two level Alternate Level Stairs-Rails: Right Alternate Level Stairs-Number of Steps: 14 Discharge Home Access: Stairs to enter Entrance Stairs-Rails: Left Entrance Stairs-Number of Steps: 5-6 Discharge Bathroom Shower/Tub: Tub/shower unit, Curtain Discharge Bathroom Toilet: Standard Discharge Bathroom Accessibility: Yes How Accessible: Accessible via walker Does the patient have any problems obtaining your medications?: No   Social/Family/Support Systems Patient Roles: Parent, Other (Comment) (Has son, dtr in social worker and  grandson) Solicitor Information: Herbalist - son - Anticipated Caregiver: Son, DTR in social worker and grandson Caregiver Availability: 24/7 Discharge Plan Discussed with Primary Caregiver: Yes Is Caregiver In Agreement with Plan?: Yes Does Caregiver/Family have Issues with Lodging/Transportation while Pt is in Rehab?: No   Goals Patient/Family Goal for Rehab: PT/OT/SLP mod I goals Expected length of stay:  7 days Pt/Family Agrees to Admission and willing to participate: Yes Program Orientation Provided & Reviewed with Pt/Caregiver Including Roles  & Responsibilities: Yes   Decrease burden of Care through IP rehab admission: N/A   Possible need for SNF placement upon discharge: Not anticipated   Patient Condition: I have  reviewed medical records from [facility], spoken with CSW, and patient and son. I met with patient at the bedside for inpatient rehabilitation assessment.  Patient will benefit from ongoing PT, OT, and SLP, can actively participate in 3 hours of therapy a day 5 days of the week, and can make measurable gains during the admission.  Patient will also benefit from the coordinated team approach during an Inpatient Acute Rehabilitation admission.  The patient will receive intensive therapy as well as Rehabilitation physician, nursing, social worker, and care management interventions.  Due to bladder management, bowel management, safety, skin/wound care, disease management, medication administration, pain management, and patient education the patient requires 24 hour a day rehabilitation nursing.  The patient is currently min/mod assist with mobility and basic ADLs.  Discharge setting and therapy post discharge at home with home health is anticipated.  Patient has agreed to participate in the Acute Inpatient Rehabilitation Program and will admit today.   Preadmission Screen Completed By:  Lovett CHRISTELLA Ropes, 06/17/2024 2:14 PM with updates by Leita Kleine, MS, CCC-SLP    ______________________________________________________________________   Discussed status with Dr. Babs on 06/21/24 at 1235 and received approval for admission today.   Admission Coordinator:  Lovett CHRISTELLA Ropes, RN, time 1235/Date 06/21/24    Assessment/Plan: Diagnosis: embolic CVA Does the need for close, 24 hr/day Medical supervision in concert with the patient's rehab needs make it unreasonable for this patient to be served in a less intensive setting? Yes Co-Morbidities requiring supervision/potential complications: CADm CKD II, PAF, RAS Due to bladder management, bowel management, safety, skin/wound care, disease management, medication administration, pain management, and patient education, does the patient require 24 hr/day rehab nursing? Yes Does the patient require coordinated care of a physician, rehab nurse, PT, OT, and SLP to address physical and functional deficits in the context of the above medical diagnosis(es)? Yes Addressing deficits in the following areas: balance, endurance, locomotion, strength, transferring, bowel/bladder control, bathing, dressing, feeding, grooming, toileting, and psychosocial support Can the patient actively participate in an intensive therapy program of at least 3 hrs of therapy 5 days a week? Yes The potential for patient to make measurable gains while on inpatient rehab is excellent Anticipated functional outcomes upon discharge from inpatient rehab: modified independent PT, modified independent OT, modified independent SLP Estimated rehab length of stay to reach the above functional goals is: 7 days Anticipated discharge destination: Home 10. Overall Rehab/Functional Prognosis: excellent     MD Signature: Arthea IVAR Babs, MD, [facility] Physical Medicine & Rehabilitation Medical Director Rehabilitation Services 06/21/2024          Revision History  Date/Time User Provider Type Action  06/21/2024  1:43 PM Babs Arthea DASEN, MD  Physician Sign  06/21/2024 12:36 PM Kleine Leita NOVAK, CCC-SLP Rehab Admission Coordinator Share  06/17/2024  3:12 PM Ropes Lovett CHRISTELLA, RN Rehab Admission Coordinator Share  06/17/2024  3:11 PM Ropes Lovett CHRISTELLA, RN Rehab Admission Coordinator Share  06/17/2024  2:26 PM Ropes Lovett CHRISTELLA, RN Rehab Admission Coordinator Share  06/17/2024  2:22 PM Ropes Lovett CHRISTELLA, RN Rehab Admission Coordinator Share  06/17/2024  2:18 PM Logue, Lovett CHRISTELLA, RN Rehab Admission Coordinator Share

## 2024-06-21 NOTE — Progress Notes (Signed)
 Occupational Therapy Treatment Patient Details Name: Dustin Ashley MRN: [id number] DOB: [birth year] Today's Date: 06/21/2024   History of present illness 83 y.o. male presents to [facility] 06/15/24 with sudden onset of dizziness s/p fall. 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 PMHx: atrial fibrillation not on anticoagulation, history of hypertension, hyperlipidemia, history of coronary artery disease status post CABG and renal artery stenosis   OT comments  Pt making good progression toward established OT goals. Pt with good recall of visual compensatory strategies/gaze stabilization. Needing external cues to slow down/for activity pacing throughout session to optimize safety as pt with tendency to move quickly. Pt able to perform standing grooming tasks, functional mobility, and toileting with up to min A. Pt son independent cueing pt for safety. Pt with improvements in dizziness this session. Will continue to follow. Due to significant functional decline, recommending intensive multidisciplinary rehabilitation >3 hours/day to optimize safety and independence in ADL.        If plan is discharge home, recommend the following:  A little help with walking and/or transfers;A little help with bathing/dressing/bathroom;Assistance with cooking/housework;Direct supervision/assist for medications management;Direct supervision/assist for financial management;Assist for transportation;Supervision due to cognitive status   Equipment Recommendations  Other (comment) (defer)    Recommendations for Other Services Rehab consult    Precautions / Restrictions Precautions Precautions: Fall Recall of Precautions/Restrictions: Intact Precaution/Restrictions Comments: HoH Restrictions Weight Bearing Restrictions Per Provider Order: No       Mobility Bed Mobility Overal bed mobility: Needs Assistance Bed Mobility: Supine to Sit Rolling:  Supervision, Used rails              Transfers Overall transfer level: Needs assistance Equipment used: Rolling walker (2 wheels) Transfers: Sit to/from Stand Sit to Stand: Min assist                 Balance Overall balance assessment: Needs assistance Sitting-balance support: No upper extremity supported, Feet supported Sitting balance-Leahy Scale: Fair     Standing balance support: Bilateral upper extremity supported, No upper extremity supported, During functional activity, Reliant on assistive device for balance Standing balance-Leahy Scale: Poor                             ADL either performed or assessed with clinical judgement   ADL Overall ADL's : Needs assistance/impaired     Grooming: Contact guard assist;Standing               Lower Body Dressing: Minimal assistance;Sitting/lateral leans   Toilet Transfer: Minimal assistance;Ambulation;Regular Toilet;Rolling walker (2 wheels);Rollator (4 wheels)   Toileting- Clothing Manipulation and Hygiene: Supervision/safety;Sitting/lateral lean       Functional mobility during ADLs: Minimal assistance      Extremity/Trunk Assessment Upper Extremity Assessment Upper Extremity Assessment: Overall WFL for tasks assessed   Lower Extremity Assessment Lower Extremity Assessment: Defer to PT evaluation        Vision   Vision Assessment?: No apparent visual deficits   Perception     Praxis     Communication Communication Communication: Impaired Factors Affecting Communication: Hearing impaired   Cognition Arousal: Alert Behavior During Therapy: WFL for tasks assessed/performed Cognition: No apparent impairments             OT - Cognition Comments: very pleasant & engaged with OT. recalls gaze stabilization from PT sessions, fair implementation, however, not needing strategies as badly this session as  previously 2/2 improved dizziness                 Following commands:  Impaired Following commands impaired: Follows multi-step commands inconsistently, Only follows one step commands consistently (likley 2/2 hearing impairment)      Cueing   Cueing Techniques: Verbal cues, Tactile cues  Exercises      Shoulder Instructions       General Comments son present and supportive    Pertinent Vitals/ Pain       Pain Assessment Pain Assessment: No/denies pain  Home Living                                          Prior Functioning/Environment              Frequency  Min 2X/week        Progress Toward Goals  OT Goals(current goals can now be found in the care plan section)  Progress towards OT goals: Progressing toward goals  Acute Rehab OT Goals Patient Stated Goal: get back to being independent OT Goal Formulation: With patient/family Time For Goal Achievement: 06/30/24 Potential to Achieve Goals: Good ADL Goals Pt Will Perform Grooming: with contact guard assist;standing Pt Will Perform Upper Body Dressing: with set-up;sitting Pt Will Perform Lower Body Dressing: with min assist;sitting/lateral leans;sit to/from stand Pt Will Transfer to Toilet: with contact guard assist;ambulating;regular height toilet;grab bars Pt Will Perform Toileting - Clothing Manipulation and hygiene: with contact guard assist;sitting/lateral leans;sit to/from stand Additional ADL Goal #1: Pt will accurately judge power and speed needed during ADL tasks with min cues.  Plan      Co-evaluation                 AM-PAC OT 6 Clicks Daily Activity     Outcome Measure   Help from another person eating meals?: None Help from another person taking care of personal grooming?: A Little Help from another person toileting, which includes using toliet, bedpan, or urinal?: A Little Help from another person bathing (including washing, rinsing, drying)?: A Lot Help from another person to put on and taking off regular upper body clothing?: A  Little Help from another person to put on and taking off regular lower body clothing?: A Little 6 Click Score: 18    End of Session Equipment Utilized During Treatment: Gait belt;Rolling walker (2 wheels)  OT Visit Diagnosis: Unsteadiness on feet (R26.81);Other abnormalities of gait and mobility (R26.89);Dizziness and giddiness (R42)   Activity Tolerance Patient tolerated treatment well   Patient Left in chair;with call bell/phone within reach;with chair alarm set;with family/visitor present   Nurse Communication Mobility status;Other (comment)        Time: 9142-9084 OT Time Calculation (min): 18 min  Charges: OT General Charges $OT Visit: 1 Visit OT Treatments $Self Care/Home Management : 8-22 mins  Dustin Ashley, OTR/L [facility] Acute Rehabilitation Office: [phone]   Dustin Ashley 06/21/2024, 1:21 PM

## 2024-06-21 NOTE — H&P (Signed)
 Physical Medicine and Rehabilitation Admission H&P    Chief Complaint  Patient presents with   Functional decline due to stroke.     HPI:  Dustin Ashley is an 83 year old RH-male with history of CAD, CKD II, PAF, PE/DVT, HOH, RAS who was admitted on 06/15/24 with sudden onset of N/V with vertigo night prior to admission and was on floor all right till next day when he was able to finally crawl and call family. CT head done revealing acute to subacute infarcts in posterior medial right temporal, right occipital, right cerebellum and potentially within brainstem. CTA head/neck showed multiple PCA infarcts, negative for LVO and occlusion of right VA V1 and V2 segments with reconstitution of distal segments. WBC elevated at 18.8 and BNP 242.6. MRI brain showed multiple PCA infarcts of varying age, large acute to subacute right cerebellar, left temporo-occipital lobe and large older subacute R-PCA infarct and small late subacute infarcts in left cerebellum. .   Stroke felt to be in setting of A fib and non compliance with eliquis . Plavix  added briefly with Follow up CT head done 12/05 showing mild worsening of edema with petechial hemorrhage and mildly increased effacement of 4th ventricle with slight dilatation of lateral and third ventricles question early hydrocephalus. He was transitioned to Eliquis  and Plavix  discontinued.  2 D echo showed EF 60-65%. Follow up labs showed acute on chronic renal failure He continues to be limited by dizzines and balance deficits. PT/OT/ST has been working with patient who requires min to mod assist with mobility, min assist with ADLs and mild cognitive linguistic deficits noted with difficulty following multistep commands.  [name] He lives alone and was independent PTA. Family lives near by and will assist after discharge.    Review of Systems  HENT:  Positive for hearing loss.   Eyes:  Negative for blurred vision.  Respiratory:  Negative for cough.    Cardiovascular:  Negative for chest pain.  Gastrointestinal:  Positive for constipation. Negative for heartburn.  Genitourinary:  Negative for frequency.  Musculoskeletal:  Positive for neck pain.  Neurological:  Positive for weakness and headaches. Negative for dizziness.  Psychiatric/Behavioral:  The patient is not nervous/anxious and does not have insomnia.      Past Medical History:  Diagnosis Date   Allergic rhinitis    Allergy    seasonal   Arthritis    Bell palsy    CAD (coronary artery disease)    Cataract    Chronic renal insufficiency, stage II (mild)    Cr ~ 1.4, u/s 4-12 normal kidneys   Gallstone pancreatitis 2015   GERD (gastroesophageal reflux disease)    HOH (hard of hearing)    has a hearing aid L, sees audiology rountinely   Hyperglycemia    A1C 5.8 04-2010   Hyperlipemia    Hypertension    Pain, joint, multiple sites    uses valium , occ uses for insomnia. uses for shoulder  pain   Paroxysmal atrial fibrillation [facility])    Personal history of colonic polyps    Pulmonary emboli (HCC)    02-2014   RAS (renal artery stenosis) 05-2012    Renal insufficiency    SCC (squamous cell carcinoma)     Past Surgical History:  Procedure Laterality Date   CARDIAC CATHETERIZATION     CATARACT EXTRACTION     CHOLECYSTECTOMY N/A 02/15/2014   Procedure: LAPAROSCOPIC CHOLECYSTECTOMY with IOC;  Surgeon: Camellia CHRISTELLA Blush, MD;  Location: [facility] OR;  Service:  General;  Laterality: N/A;   COLONOSCOPY     CORONARY ARTERY BYPASS GRAFT  1995   ESOPHAGOGASTRODUODENOSCOPY N/A 03/02/2014   Procedure: ESOPHAGOGASTRODUODENOSCOPY (EGD);  Surgeon: Gwendlyn ONEIDA Buddy, MD;  Location: [facility] ENDOSCOPY;  Service: Endoscopy;  Laterality: N/A;  sarah/leone    Family History  Problem Relation Age of Onset   Hypertension Father    Heart attack Brother        2 brothers, CABG   Diabetes Neg Hx    Colon cancer Neg Hx    Prostate cancer Neg Hx        colon, prostate   Rectal cancer Neg Hx    Stomach  cancer Neg Hx     Social History:  reports that he quit smoking about 48 years ago. His smoking use included cigarettes. He has never used smokeless tobacco. He reports that he does not drink alcohol and does not use drugs.   Allergies  Allergen Reactions   Hydrocodone -Acetaminophen  Other (See Comments)    REACTION: bladder obstruction   Tramadol Hcl Other (See Comments)    REACTION: bladder obstruction    Medications Prior to Admission  Medication Sig Dispense Refill   amLODipine  (NORVASC ) 5 MG tablet Take 1 tablet (5 mg total) by mouth daily. 180 tablet 3   apixaban  (ELIQUIS ) 2.5 MG TABS tablet Take 1 tablet (2.5 mg total) by mouth 2 (two) times daily. (Patient not taking: No sig reported) 60 tablet 0   atenolol  (TENORMIN ) 50 MG tablet Take 1.5 tablets (75 mg total) by mouth daily. 135 tablet 1   atorvastatin  (LIPITOR) 20 MG tablet TAKE 1 TABLET BY MOUTH EVERY DAY 90 tablet 1   benazepril  (LOTENSIN ) 40 MG tablet Take 0.5 tablets (20 mg total) by mouth 2 (two) times daily.     benzonatate  (TESSALON  PERLES) 100 MG capsule Take 1 capsule (100 mg total) by mouth 3 (three) times daily as needed for cough. 21 capsule 0   diazepam  (VALIUM ) 10 MG tablet Take 1 tablet (10 mg total) by mouth every 12 (twelve) hours as needed for anxiety or sleep. 60 tablet 3   fexofenadine (ALLEGRA) 180 MG tablet Take 180 mg by mouth daily.      Home: Home Living Family/patient expects to be discharged to:: Private residence Living Arrangements: Alone Available Help at Discharge: Family, Friend(s), Available 24 hours/day Type of Home: House Home Access: Stairs to enter [employer] of Steps: 5-6 Entrance Stairs-Rails: Left Home Layout: Two level, Able to live on main level with bedroom/bathroom, 1/2 bath on main level Alternate Level Stairs-Number of Steps: 14 Alternate Level Stairs-Rails: Right Bathroom Shower/Tub: Engineer, Manufacturing Systems: Standard Home Equipment: [employer] (2  wheels), [employer] - single point, BSC/3in1 Additional Comments: Pt will either d/c to his home (above) or his son's  Lives With: Alone   Functional History: Prior Function Prior Level of Function : Independent/Modified Independent, Driving Mobility Comments: no AD PTA ADLs Comments: indep, driving, manages medications, cooks and does his own Health Visitor Status:  Mobility: Bed Mobility Overal bed mobility: Needs Assistance Bed Mobility: Supine to Sit Rolling: Supervision, Used rails Sidelying to sit: Supervision, Used rails General bed mobility comments: not assessed - pt received with PT on arrival and left in chair upon OT exit Transfers Overall transfer level: Needs assistance Equipment used: Rolling walker (2 wheels) Transfers: Bed to chair/wheelchair/BSC Sit to Stand: Min assist, Contact guard assist Bed to/from chair/wheelchair/BSC transfer type:: Step pivot Step pivot transfers: Min assist General transfer comment: Min  A for reduced coordinated movements when changing directions,  approaching surfaces and navigating obstacles. Ambulation/Gait Ambulation/Gait assistance: Min assist, Mod assist, Contact guard assist Gait Distance (Feet): 80 Feet (x4) Assistive device: Rolling walker (2 wheels), 1 person hand held assist Gait Pattern/deviations: Step-through pattern, Decreased stride length, Drifts right/left, Trunk flexed General Gait Details: MinA/CGA when ambulating with use of RW with heavy reliance on UE support. Up to [facility] when ambulating with 1HH for unsteadiness and increased postural sway. Reported 6/10 dizziness when ambulating, cues for gaze stabilization Gait velocity: decr    ADL: ADL Overall ADL's : Needs assistance/impaired Eating/Feeding: Minimal assistance, Sitting Grooming: Contact guard assist, Standing, Wash/dry hands Grooming Details (indicate cue type and reason): pt improperly judging power needed for water pressure, cued to locate paper towel  dispenser Upper Body Dressing : Minimal assistance Lower Body Dressing: Minimal assistance, Sitting/lateral leans Lower Body Dressing Details (indicate cue type and reason): cued for figure-4 technique to prevent further dizziness, pt with incr effort to don socks d/t reduced flexibility Toilet Transfer: Minimal assistance, Ambulation, Regular Toilet, Rolling walker (2 wheels) Toilet Transfer Details (indicate cue type and reason): VC for safe approach and reaching for GBs to assist Toileting- Clothing Manipulation and Hygiene: Supervision/safety, Sitting/lateral lean Toileting - Clothing Manipulation Details (indicate cue type and reason): anterior peri hygiene seated on toilet Functional mobility during ADLs: Minimal assistance, Contact guard assist, Rolling walker (2 wheels) General ADL Comments: Pt engaged in IADL task of folding laundry at the sink. Encouraged to focus on one washcloth at a time, challenging dynamic balance with frequent truncal sway, occasionally needing UE support from vanity and min A to stabilize self.  Cognition: Cognition Overall Cognitive Status: Impaired/Different from baseline Arousal/Alertness: Awake/alert Orientation Level: Oriented X4 Year: 2025 Month: December Day of Week: Correct Attention: Focused, Sustained, Selective Focused Attention: Appears intact Sustained Attention: Appears intact Selective Attention: Appears intact Memory: Impaired Memory Impairment: Decreased short term memory, Other (comment) Decreased Short Term Memory: Verbal basic, Functional basic Awareness: Impaired Awareness Impairment: Intellectual impairment Problem Solving: Impaired Problem Solving Impairment: Verbal basic, Functional basic Executive Function: Self Monitoring, Self Correcting Self Monitoring: Impaired Self Monitoring Impairment: Verbal basic, Functional basic Self Correcting: Impaired Self Correcting Impairment: Verbal basic, Functional basic Safety/Judgment:  Appears intact Cognition Arousal: Alert Behavior During Therapy: WFL for tasks assessed/performed Overall Cognitive Status: Impaired/Different from baseline   Blood pressure (!) 143/65, pulse 83, temperature 97.7 F (36.5 C), temperature source Oral, resp. rate 19, height 5' 9 (1.753 m), weight 90.7 kg, SpO2 96%. Physical Exam Vitals and nursing note reviewed.  Constitutional:      General: He is not in acute distress.    Appearance: Normal appearance. He is not ill-appearing.  HENT:     Head: Normocephalic and atraumatic.     Right Ear: External ear normal.     Left Ear: External ear normal.     Nose: Nose normal.     Mouth/Throat:     Mouth: Mucous membranes are moist.     Pharynx: Oropharynx is clear.  Eyes:     Extraocular Movements: Extraocular movements intact.     Conjunctiva/sclera: Conjunctivae normal.     Pupils: Pupils are equal, round, and reactive to light.  Cardiovascular:     Rate and Rhythm: Normal rate and regular rhythm.     Pulses: Normal pulses.     Heart sounds: No murmur heard.    No gallop.  Pulmonary:     Effort: Pulmonary effort is normal. No respiratory distress.  Breath sounds: Normal breath sounds. No wheezing.  Abdominal:     General: Bowel sounds are normal. There is no distension.     Palpations: Abdomen is soft.     Tenderness: There is no abdominal tenderness.  Musculoskeletal:        General: No swelling.     Cervical back: Normal range of motion.     Right lower leg: No edema.     Left lower leg: Edema present.  Skin:    General: Skin is warm and dry.     Findings: Bruising present.  Neurological:     Mental Status: He is alert and oriented to person, place, and time.     Comments: HOH. Able to answer orientation questions and follow simple motor commands. Oriented to person, place, reason, month, day, year, day of week. Provided me his home address. Normal language and speech. CN exam non-focal. MMT: 4 to 4+/5 prox to distal in  BUE. 4- to 4/5 prox to distal in BLE. Sensory exam normal for light touch and pain in all 4 limbs. No limb ataxia or cerebellar signs but slight impairments in [facility]. No abnormal tone appreciated.    Psychiatric:        Mood and Affect: Mood normal.        Behavior: Behavior normal.     No results found for this or any previous visit (from the past 48 hours). No results found.    Blood pressure (!) 143/65, pulse 83, temperature 97.7 F (36.5 C), temperature source Oral, resp. rate 19, height 5' 9 (1.753 m), weight 90.7 kg, SpO2 96%.  Medical Problem List and Plan: 1. 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  Antithrombotics: -DVT/anticoagulation:  Pharmaceutical: Eliquis   -antiplatelet therapy: ASA 3. Pain Management: Tylenol  prn.  4. Mood/Behavior/Sleep: LCSW to follow for evaluation and support.   -antipsychotic agents: N/A  --Hx of insomnia/LBP and on valium  bid prn.  5. Neuropsych/cognition: This patient is capable of making decisions on his own behalf. 6. Skin/Wound Care: Routine pressure relief measures.  7. Fluids/Electrolytes/Nutrition: Monitor I/O.  8.  Acute on chronic renal failure: Encourage fluid intake. SCr 1.3 @ admission and trending up to 1.46   --recheck renal status in am.  9. CAD s/p: Has hx of presyncope in the past. Continue Lipitor and ASA.   --monitor of orthostatic symptoms also.  10.  HTN: Monitor BP TID--on Amlodipine  10 mg, Lotensin  20 mg bid, Hydralazine  25 mg TID, Tenormin  75 mg   -bp currently controlled 11. PAF: Rate controlled and monitor for symptoms with increase in activity. --on atenolol  daily 12. Vestibular dysfunction:Dizziness: Therapy to continue to work on  VOR. --Meclizine  prn.     Sharlet GORMAN Schmitz, PA-C 06/21/2024

## 2024-06-21 NOTE — Plan of Care (Signed)

## 2024-06-21 NOTE — TOC Transition Note (Signed)
 Transition of Care [facility]) - Discharge Note   Patient Details  Name: Dustin Ashley MRN: [id number] Date of Birth: January 19, 1941  Transition of [facility]) CM/SW Contact:  Andrez JULIANNA George, RN Phone Number: 06/21/2024, 1:07 PM   Clinical Narrative:     Pt is discharging to CIR today. No further needs per CM.  Final next level of care: IP Rehab Facility Barriers to Discharge: No Barriers Identified   Patient Goals and CMS Choice   CMS Medicare.gov Compare Post Acute Care list provided to:: Patient Choice offered to / list presented to : Patient      Discharge Placement                       Discharge Plan and Services Additional resources added to the After Visit Summary for                                       Social Drivers of Health (SDOH) Interventions SDOH Screenings   Food Insecurity: No Food Insecurity (06/15/2024)  Housing: Low Risk  (06/15/2024)  Transportation Needs: No Transportation Needs (06/15/2024)  Utilities: Not At Risk (06/15/2024)  Alcohol Screen: Low Risk  (04/08/2022)  Depression (PHQ2-9): Low Risk  (04/12/2024)  Financial Resource Strain: Low Risk  (04/08/2022)  Physical Activity: Inactive (04/08/2022)  Social Connections: Moderately Isolated (06/15/2024)  Stress: No Stress Concern Present (04/08/2022)  Tobacco Use: Medium Risk (06/15/2024)     Readmission Risk Interventions     No data to display

## 2024-06-21 NOTE — Plan of Care (Signed)
  Problem: Consults Goal: RH STROKE PATIENT EDUCATION Description: See Patient Education module for education specifics  Outcome: Progressing   Problem: RH SAFETY Goal: RH STG ADHERE TO SAFETY PRECAUTIONS W/ASSISTANCE/DEVICE Description: STG Adhere to Safety Precautions With cues Assistance/Device. Outcome: Progressing   Problem: RH KNOWLEDGE DEFICIT Goal: RH STG INCREASE KNOWLEDGE OF HYPERTENSION Description: Patient and son will be able to manage HTN using educational resources for medications and dietary modification independently Outcome: Progressing Goal: RH STG INCREASE KNOWLEDGE OF DYSPHAGIA/FLUID INTAKE Description: Patient and son will be able to manage dysphagia using educational resources for medications and dietary modification independently Outcome: Progressing Goal: RH STG INCREASE KNOWLEGDE OF HYPERLIPIDEMIA Description: Patient and son will be able to manage HLD using educational resources for medications and dietary modification independently Outcome: Progressing   Problem: RH KNOWLEDGE DEFICIT Goal: RH STG INCREASE KNOWLEDGE OF STROKE PROPHYLAXIS Description: Patient and son will be able to manage secondary risks using educational resources for medications and dietary modification independently Outcome: Progressing

## 2024-06-21 NOTE — Progress Notes (Signed)
 Pt arrived on unit

## 2024-06-21 NOTE — Progress Notes (Signed)
 Patient going to 4M11. Report given to Keasja, CHARITY FUNDRAISER.

## 2024-06-21 NOTE — Progress Notes (Signed)
 Inpatient Rehab Admissions Coordinator:   I have a CIR bed for this Pt. Today. RN may call report to [phone].   Pt. To admit to CIR for estimated 10-12 days with the goal of dc home with his son for 24/7 supervision to min A.   Leita Kleine, MS, CCC-SLP Rehab Admissions Coordinator  [phone] (celll) [phone] (office)

## 2024-06-22 LAB — CBC WITH DIFFERENTIAL/PLATELET
Abs Immature Granulocytes: 0.02 K/uL (ref 0.00–0.07)
Basophils Absolute: 0.1 K/uL (ref 0.0–0.1)
Basophils Relative: 1 %
Eosinophils Absolute: 0.4 K/uL (ref 0.0–0.5)
Eosinophils Relative: 4 %
HCT: 40.2 % (ref 39.0–52.0)
Hemoglobin: 13.8 g/dL (ref 13.0–17.0)
Immature Granulocytes: 0 %
Lymphocytes Relative: 26 %
Lymphs Abs: 2.8 K/uL (ref 0.7–4.0)
MCH: 29.7 pg (ref 26.0–34.0)
MCHC: 34.3 g/dL (ref 30.0–36.0)
MCV: 86.5 fL (ref 80.0–100.0)
Monocytes Absolute: 1.2 K/uL — ABNORMAL HIGH (ref 0.1–1.0)
Monocytes Relative: 11 %
Neutro Abs: 6.3 K/uL (ref 1.7–7.7)
Neutrophils Relative %: 58 %
Platelets: 178 K/uL (ref 150–400)
RBC: 4.65 MIL/uL (ref 4.22–5.81)
RDW: 13.3 % (ref 11.5–15.5)
WBC: 10.8 K/uL — ABNORMAL HIGH (ref 4.0–10.5)
nRBC: 0 % (ref 0.0–0.2)

## 2024-06-22 LAB — COMPREHENSIVE METABOLIC PANEL WITH GFR
ALT: 23 U/L (ref 0–44)
AST: 21 U/L (ref 15–41)
Albumin: 2.8 g/dL — ABNORMAL LOW (ref 3.5–5.0)
Alkaline Phosphatase: 79 U/L (ref 38–126)
Anion gap: 16 — ABNORMAL HIGH (ref 5–15)
BUN: 32 mg/dL — ABNORMAL HIGH (ref 8–23)
CO2: 18 mmol/L — ABNORMAL LOW (ref 22–32)
Calcium: 8.5 mg/dL — ABNORMAL LOW (ref 8.9–10.3)
Chloride: 105 mmol/L (ref 98–111)
Creatinine, Ser: 1.45 mg/dL — ABNORMAL HIGH (ref 0.61–1.24)
GFR, Estimated: 48 mL/min — ABNORMAL LOW (ref >60)
Glucose, Bld: 103 mg/dL — ABNORMAL HIGH (ref 70–99)
Potassium: 3.8 mmol/L (ref 3.5–5.1)
Sodium: 139 mmol/L (ref 135–145)
Total Bilirubin: 1 mg/dL (ref 0.0–1.2)
Total Protein: 5.6 g/dL — ABNORMAL LOW (ref 6.5–8.1)

## 2024-06-22 LAB — MAGNESIUM: Magnesium: 1.8 mg/dL (ref 1.7–2.4)

## 2024-06-22 LAB — BRAIN NATRIURETIC PEPTIDE: B Natriuretic Peptide: 180.1 pg/mL — ABNORMAL HIGH (ref 0.0–100.0)

## 2024-06-22 MED ORDER — MAGNESIUM OXIDE -MG SUPPLEMENT 400 (240 MG) MG PO TABS
200.0000 mg | ORAL_TABLET | Freq: Every day | ORAL | Status: DC
  Administered 2024-06-22 – 2024-06-23 (×2): 200 mg via ORAL
  Filled 2024-06-22 (×2): qty 1

## 2024-06-22 MED ORDER — L-METHYLFOLATE-B6-B12 3-35-2 MG PO TABS
1.0000 | ORAL_TABLET | Freq: Every day | ORAL | Status: AC
  Administered 2024-06-22 – 2024-06-29 (×8): 1 via ORAL
  Filled 2024-06-22 (×8): qty 1

## 2024-06-22 NOTE — Progress Notes (Signed)
 Inpatient Rehabilitation Center Individual Statement of Services  Patient Name:  Dustin Ashley  Date:  06/22/2024  Welcome to the Inpatient Rehabilitation Center.  Our goal is to provide you with an individualized program based on your diagnosis and situation, designed to meet your specific needs.  With this comprehensive rehabilitation program, you will be expected to participate in at least 3 hours of rehabilitation therapies Monday-Friday, with modified therapy programming on the weekends.  Your rehabilitation program will include the following services:  Physical Therapy (PT), Occupational Therapy (OT), Speech Therapy (ST), 24 hour per day rehabilitation nursing, Therapeutic Recreaction (TR), Care Coordinator, Rehabilitation Medicine, Nutrition Services, and Pharmacy Services  Weekly team conferences will be held on Wednesday to discuss your progress.  Your Inpatient Rehabilitation Care Coordinator will talk with you frequently to get your input and to update you on team discussions.  Team conferences with you and your family in attendance may also be held.  Expected length of stay: 7-10 days  Overall anticipated outcome: Independent with assistive device   Depending on your progress and recovery, your program may change. Your Inpatient Rehabilitation Care Coordinator will coordinate services and will keep you informed of any changes. Your Inpatient Rehabilitation Care Coordinator's name and contact numbers are listed  below.  The following services may also be recommended but are not provided by the Inpatient Rehabilitation Center:  Driving Evaluations Home Health Rehabiltiation Services Outpatient Rehabilitation Services Vocational Rehabilitation   Arrangements will be made to provide these services after discharge if needed.  Arrangements include referral to agencies that provide these services.  Your insurance has been verified to be: UNITED [employer] MEDICARE / [facility] MEDICARE  Your  primary doctor is: Amon Aloysius BRAVO, MD  Pertinent information will be shared with your doctor and your insurance company.  Inpatient Rehabilitation Care Coordinator:  Di'Asia Ashley SIERRAS [phone] or ELIGAH BRINKS  Information discussed with and copy given to patient by: Dustin Ashley, 06/22/2024, 2:25 PM

## 2024-06-22 NOTE — Progress Notes (Addendum)
 Inpatient Rehabilitation Admission Medication Review by a Pharmacist  A complete drug regimen review was completed for this patient to identify any potential clinically significant medication issues.  High Risk Drug Classes Is patient taking? Indication by Medication  Antipsychotic Yes Compazine  prn N/V  Anticoagulant Yes Apixaban  - CVA  Antibiotic No   Opioid Yes Oxycodone  prn pain  Antiplatelet No   Hypoglycemics/insulin No   Vasoactive Medication Yes Amlodipine , atenolol , benazepril  -HTN  Chemotherapy No   Other Yes Atorvastatin  - HLD Meclizine  - prn dizziness Diazepam  - prn dizziness, anxiety     Type of Medication Issue Identified Description of Issue Recommendation(s)  Drug Interaction(s) (clinically significant)     Duplicate Therapy     Allergy     No Medication Administration End Date     Incorrect Dose     Additional Drug Therapy Needed     Significant med changes from prior encounter (inform family/care partners about these prior to discharge).    Other       Clinically significant medication issues were identified that warrant physician communication and completion of prescribed/recommended actions by midnight of the next day:  No  Name of provider notified for urgent issues identified:   Provider Method of Notification:   Pharmacist comments: None  Time spent performing this drug regimen review (minutes):  20 minutes  Thank you. Olam Monte, PharmD

## 2024-06-22 NOTE — Progress Notes (Signed)
 Inpatient Rehabilitation  Patient information reviewed and entered into eRehab system by [facility]. Karen Kays., CCC/SLP, PPS Coordinator.  Information including medical coding, functional ability and quality indicators will be reviewed and updated through discharge.

## 2024-06-22 NOTE — Progress Notes (Signed)
 Inpatient Rehabilitation Care Coordinator Assessment and Plan Patient Details  Name: Dustin Ashley MRN: [id number] Date of Birth: 07/13/1941  Today's Date: 06/22/2024  Hospital Problems: Principal Problem:   Embolic stroke involving right cerebellar artery [facility])  Past Medical History:  Past Medical History:  Diagnosis Date   Allergic rhinitis    Allergy    seasonal   Arthritis    Bell palsy    CAD (coronary artery disease)    Cataract    Chronic renal insufficiency, stage II (mild)    Cr ~ 1.4, u/s 4-12 normal kidneys   Gallstone pancreatitis 2015   GERD (gastroesophageal reflux disease)    HOH (hard of hearing)    has a hearing aid L, sees audiology rountinely   Hyperglycemia    A1C 5.8 04-2010   Hyperlipemia    Hypertension    Pain, joint, multiple sites    uses valium , occ uses for insomnia. uses for shoulder  pain   Paroxysmal atrial fibrillation (HCC)    Personal history of colonic polyps    Pulmonary emboli (HCC)    02-2014   RAS (renal artery stenosis) 05-2012    Renal insufficiency    SCC (squamous cell carcinoma)    Past Surgical History:  Past Surgical History:  Procedure Laterality Date   CARDIAC CATHETERIZATION     CATARACT EXTRACTION     CHOLECYSTECTOMY N/A 02/15/2014   Procedure: LAPAROSCOPIC CHOLECYSTECTOMY with IOC;  Surgeon: Camellia CHRISTELLA Blush, MD;  Location: [facility] OR;  Service: General;  Laterality: N/A;   COLONOSCOPY     CORONARY ARTERY BYPASS GRAFT  1995   ESOPHAGOGASTRODUODENOSCOPY N/A 03/02/2014   Procedure: ESOPHAGOGASTRODUODENOSCOPY (EGD);  Surgeon: Gwendlyn ONEIDA Buddy, MD;  Location: [facility] ENDOSCOPY;  Service: Endoscopy;  Laterality: N/A;  sarah/leone   Social History:  reports that he quit smoking about 48 years ago. His smoking use included cigarettes. He has never used smokeless tobacco. He reports that he does not drink alcohol and does not use drugs.  Family / Support Systems Marital Status: Widow/Widower Patient Roles: Parent, Other (Comment) (Has  son, dtr in social worker and grandson) Children: Therapist, Music; son Anticipated Caregiver: Son, DTR in social worker and grandson Ability/Limitations of Caregiver: None Caregiver Availability: 24/7 Family Dynamics: Good support  Social History Preferred language: English Religion: Non-Denominational Education: High school Health Literacy - How often do you need to have someone help you when you read instructions, pamphlets, or other written material from your doctor or pharmacy?: Sometimes Writes: Yes Employment Status: Retired   Abuse/Neglect Abuse/Neglect Assessment Can Be Completed: Yes Physical Abuse: Denies Verbal Abuse: Denies Sexual Abuse: Denies Exploitation of patient/patient's resources: Denies Self-Neglect: Denies  Patient response to: Social Isolation - How often do you feel lonely or isolated from those around you?: Never  Emotional Status Pt's affect, behavior and adjustment status: Patient adjusting well to therapy Recent Psychosocial Issues: None Psychiatric History: None Substance Abuse History: Former smoker  Patient / Field Seismologist, Expectations & Goals Pt/Family understanding of illness & functional limitations: Patient/family understanding of illness & functional limitation Premorbid pt/family roles/activities: Independent prior to admission, driving, Anticipated changes in roles/activities/participation: No changes anticipated 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, Museum/gallery Agencies: None Premorbid Home Care/DME Agencies: Other (Comment) Adult Nurse (2 wheels), [employer] - single point, [facility]/3in1) Transportation available at discharge: Patient Is the patient able to respond to transportation needs?: Yes In the past 12 months, has lack of transportation kept you from medical appointments or from getting medications?: No In the past 12 months,  has lack of transportation kept you from meetings, work, or from getting things needed for daily  living?: No Resource referrals recommended: Neuropsychology (Declined)  Discharge Planning Living Arrangements: Alone Support Systems: Children, Other relatives Type of Residence: Private residence Insurance Resources: Media Planner (specify) (UNITED HEALTHCARE MEDICARE / [facility] MEDICARE) Financial Resources: Other (Comment) (Retirement) Financial Screen Referred: Yes Living Expenses: Own Money Management: Patient Does the patient have any problems obtaining your medications?: No Home Management: Manages own home Patient/Family Preliminary Plans: Plans to return home wiut full support from his son, Candi, wife, and their son, [name]. Care Coordinator Anticipated Follow Up Needs: HH/OP Expected length of stay: 7-10 days  Clinical Impression CSW met with patient/family to introduce herself and complete initial assessment. Patient is able to make needs known. Patient presented to [facility] following Embolic stroke involving right cerebellar artery (HCC). He lives alone following the passing of his wife in a house with over 12 steps. Patient was very independent prior to admission by going to the [facility] gym 3x weekly and driving. Patient's son, Candi reports that he will either go home with his 24/7 support or he will go live with them for a week or so after discharge. SW explained that every patient receives OP or HH therapy following discharge that helps with independence. Patient expresses that he will think about participating in services. Has DME from previous injuries. Declined neuropsych services.   There were no further needs or concerns at present. CSW will follow up with family and continue to follow. Will provide patient/family with an update as soon as one becomes available.   Dustin  Ashley 06/22/2024, 2:26 PM

## 2024-06-22 NOTE — Evaluation (Signed)
 Speech Language Pathology Assessment and Plan  Patient Details  Name: Dustin Ashley MRN: [id number] Date of Birth: 1940-12-13  SLP Diagnosis:   n/a Rehab Potential:  n/a ELOS:   n/a   Today's Date: 06/22/2024 SLP Individual Time: 8896-8852 SLP Individual Time Calculation (min): 44 min   Hospital Problem: Principal Problem:   Embolic stroke involving right cerebellar artery (HCC)  Past Medical History:  Past Medical History:  Diagnosis Date   Allergic rhinitis    Allergy    seasonal   Arthritis    Bell palsy    CAD (coronary artery disease)    Cataract    Chronic renal insufficiency, stage II (mild)    Cr ~ 1.4, u/s 4-12 normal kidneys   Gallstone pancreatitis 2015   GERD (gastroesophageal reflux disease)    HOH (hard of hearing)    has a hearing aid L, sees audiology rountinely   Hyperglycemia    A1C 5.8 04-2010   Hyperlipemia    Hypertension    Pain, joint, multiple sites    uses valium , occ uses for insomnia. uses for shoulder  pain   Paroxysmal atrial fibrillation (HCC)    Personal history of colonic polyps    Pulmonary emboli (HCC)    02-2014   RAS (renal artery stenosis) 05-2012    Renal insufficiency    SCC (squamous cell carcinoma)    Past Surgical History:  Past Surgical History:  Procedure Laterality Date   CARDIAC CATHETERIZATION     CATARACT EXTRACTION     CHOLECYSTECTOMY N/A 02/15/2014   Procedure: LAPAROSCOPIC CHOLECYSTECTOMY with IOC;  Surgeon: Camellia CHRISTELLA Blush, MD;  Location: [facility] OR;  Service: General;  Laterality: N/A;   COLONOSCOPY     CORONARY ARTERY BYPASS GRAFT  1995   ESOPHAGOGASTRODUODENOSCOPY N/A 03/02/2014   Procedure: ESOPHAGOGASTRODUODENOSCOPY (EGD);  Surgeon: Gwendlyn ONEIDA Buddy, MD;  Location: [facility] ENDOSCOPY;  Service: Endoscopy;  Laterality: N/A;  sarah/leone    Assessment / Plan / Recommendation Clinical Impression  Dustin Ashley is an 83 year old RH-male with history of CAD, CKD II, PAF, PE/DVT, HOH, RAS who was admitted on 06/15/24  with sudden onset of N/V with vertigo. CT head done revealing acute to subacute infarcts in posterior medial right temporal, right occipital, right cerebellum and potentially within brainstem. CTA head/neck showed multiple PCA infarcts, negative for LVO and occlusion of right VA V1 and V2 segments with reconstitution of distal segments. WBC elevated at 18.8 and BNP 242.6. MRI brain showed multiple PCA infarcts of varying age, large acute to subacute right cerebellar, left temporo-occipital lobe and large older subacute R-PCA infarct and small late subacute infarcts in left cerebellum. Stroke felt to be in setting of A fib. He lives alone and was independent PTA. Family lives near by and will assist after discharge.   Cognitive-Linguistic Evaluation: Patient presents with cognitive-linguistic function that is at baseline per patient and per son, present throughout duration of session. Patient and son note that at baseline, patient lives alone and they describe some mildly increased processing time, particularly when patient first wakes up, but otherwise report no difficulty with ADLs/iADLs. Patient enjoys playing poker and attending gym sessions at baseline and is retired. SLP administered Cognistat. Patient exhibited trace to mild deficits in mildly complex problem solving and scored WFL across domains of orientation, immediate and short term memory, attention, naming, reasoning, and judgement. To simulate iADL responsibility compared to PTA, facilitated additional medication-based error identification task. At beginning of novel task, patient required min  to mod encouragement to thoroughly read medication instructions and sequence/organize task, however by end of task, patient was completing items accurately and independently. No deficits appreciated across domains of receptive/expressive language nor motor speech. No swallowing concerns noted by patient or family. No further SLP services indicated at this time,  as son remarks that all above noted deficits are baseline. SLP will sign off.    Skilled Therapeutic Interventions          SLP conducted skilled evaluation session to assess cognitive-linguistic function. Utilized Cognistat, functional complex problem solving assessment, and patient and/or family interview. Full results above.   SLP Assessment  Patient does not need any further Speech Lanaguage Pathology Services    Recommendations  Patient destination: Home Follow up Recommendations: None Equipment Recommended: None recommended by SLP    SLP Frequency   N/a  SLP Duration  SLP Intensity  SLP Treatment/Interventions  N/a   N/a    N/a   Pain Pain Assessment Pain Scale: 0-10 Pain Score: 0-No pain  Prior Functioning Cognitive/Linguistic Baseline: Within functional limits Type of Home: House  Lives With: Alone Available Help at Discharge: Family;Friend(s);Available 24 hours/day Education: completed up to 10th grade Vocation: Retired  SLP Evaluation Cognition Overall Cognitive Status: History of cognitive impairments - at baseline Arousal/Alertness: Awake/alert Orientation Level: Oriented X4 Year: 2025 Month: December Day of Week: Correct Attention: Focused;Sustained;Selective Focused Attention: Appears intact Sustained Attention: Appears intact Selective Attention: Appears intact Memory: Appears intact Awareness: Impaired Awareness Impairment: Emergent impairment Problem Solving: Impaired Problem Solving Impairment: Verbal complex;Functional complex Executive Function: Self Monitoring;Self Correcting Self Monitoring: Impaired Self Monitoring Impairment: Functional complex Self Correcting: Appears intact Safety/Judgment: Appears intact  Comprehension Auditory Comprehension Overall Auditory Comprehension: Appears within functional limits for tasks assessed Expression Expression Primary Mode of Expression: Verbal Verbal Expression Overall Verbal Expression:  Appears within functional limits for tasks assessed Written Expression Dominant Hand: Right Oral Motor Oral Motor/Sensory Function Overall Oral Motor/Sensory Function: Within functional limits Motor Speech Overall Motor Speech: Appears within functional limits for tasks assessed  Care Tool Care Tool Cognition Ability to hear (with hearing aid or hearing appliances if normally used Ability to hear (with hearing aid or hearing appliances if normally used): 2. Moderate difficulty - speaker has to increase volume and speak distinctly   Expression of Ideas and Wants Expression of Ideas and Wants: 4. Without difficulty (complex and basic) - expresses complex messages without difficulty and with speech that is clear and easy to understand   Understanding Verbal and Non-Verbal Content Understanding Verbal and Non-Verbal Content: 4. Understands (complex and basic) - clear comprehension without cues or repetitions  Memory/Recall Ability Memory/Recall Ability : Current season;That he or she is in a hospital/hospital unit    The above assessment, treatment plan, treatment alternatives and goals were discussed and mutually agreed upon: by patient  Emrys Mckamie, M.A., CCC-SLP  Ndrew Creason A Resean Brander 06/22/2024, 12:28 PM

## 2024-06-22 NOTE — Progress Notes (Signed)
 Patient ID: Dustin Ashley, male   DOB: [birth year], 83 y.o.   MRN: [id number] Met with the patient and son to review current medical situation, rehab process, team conference and plan of care. Discussed secondary risk management including HTN, HLD (LDL 60/Trig 93) on Lipitor post stroke with CAD/PAF previously on Eliquis  but reportedly had stopped taking medication due to community information regarding efficacy of the medication and stubborn about not needing medication. Reports a better understanding of the medications and rationale for medication prescribed. Reviewed HH diet modification recommendations and ZONE tool use.  Continue to follow along to address educational needs to facilitate preparation for discharge. Fredericka Barnie NOVAK

## 2024-06-22 NOTE — Progress Notes (Signed)
   06/22/24 1105  Spiritual Encounters  Type of Visit Initial  Care provided to: Pt and family  Referral source Patient request  Reason for visit Advance directives  OnCall Visit Yes   Responded to request for HCPA. Provided AD education to patient and his son, Adriana. Patient and son will discuss with other family members and return completed document.

## 2024-06-22 NOTE — Progress Notes (Signed)
 Patient ID: Dustin Ashley, male   DOB: Sep 28, 1940, 83 y.o.   MRN: [id number]  Statement of service delivered.

## 2024-06-22 NOTE — Progress Notes (Signed)
 PROGRESS NOTE   Subjective/Complaints: No new complaints this morning Son is at bedside Hard of hearing VSS  ROS: denies pain   Objective:   No results found. Recent Labs    06/22/24 0524  WBC 10.8*  HGB 13.8  HCT 40.2  PLT 178   Recent Labs    06/22/24 0524  NA 139  K 3.8  CL 105  CO2 18*  GLUCOSE 103*  BUN 32*  CREATININE 1.45*  CALCIUM  8.5*    Intake/Output Summary (Last 24 hours) at 06/22/2024 1302 Last data filed at 06/22/2024 0831 Gross per 24 hour  Intake 480 ml  Output --  Net 480 ml        Physical Exam: Vital Signs Blood pressure (!) 149/58, pulse 62, temperature 98.2 F (36.8 C), resp. rate 18, height 5' 9 (1.753 m), weight 82.6 kg, SpO2 95%. Gen: no distress, normal appearing HEENT: oral mucosa pink and moist, NCAT Cardio: Reg rate Chest: normal effort, normal rate of breathing Abd: soft, non-distended Ext: no edema Psych: pleasant, normal affect Musculoskeletal:        General: No swelling.     Cervical back: Normal range of motion.     Right lower leg: No edema.     Left lower leg: Edema present.  Skin:    General: Skin is warm and dry.     Findings: Bruising present.  Neurological:     Mental Status: He is alert and oriented to person, place, and time.     Comments: HOH. Able to answer orientation questions and follow simple motor commands. Oriented to person, place, reason, month, day, year, day of week. Provided me his home address. Normal language and speech. CN exam non-focal. MMT: 4 to 4+/5 prox to distal in BUE. 4- to 4/5 prox to distal in BLE. Sensory exam normal for light touch and pain in all 4 limbs. No limb ataxia or cerebellar signs but slight impairments in [facility]. No abnormal tone appreciated.    Psychiatric:        Mood and Affect: Mood normal.        Behavior: Behavior normal.    Assessment/Plan: 1. Functional deficits which require 3+ hours per day of  interdisciplinary therapy in a comprehensive inpatient rehab setting. Physiatrist is providing close team supervision and 24 hour management of active medical problems listed below. Physiatrist and rehab team continue to assess barriers to discharge/monitor patient progress toward functional and medical goals  Care Tool:  Bathing    Body parts bathed by patient: Right arm, Left arm, Chest, Abdomen, Front perineal area, Buttocks, Right upper leg, Left upper leg, Right lower leg, Left lower leg, Face         Bathing assist Assist Level: Contact Guard/Touching assist     Upper Body Dressing/Undressing Upper body dressing   What is the patient wearing?: Pull over shirt    Upper body assist Assist Level: Supervision/Verbal cueing    Lower Body Dressing/Undressing Lower body dressing      What is the patient wearing?: Pants, Underwear/pull up     Lower body assist Assist for lower body dressing: Contact Guard/Touching assist     Toileting Toileting  Toileting assist Assist for toileting: Contact Guard/Touching assist     Transfers Chair/bed transfer  Transfers assist     Chair/bed transfer assist level: Contact Guard/Touching assist     Locomotion Ambulation   Ambulation assist              Walk 10 feet activity   Assist           Walk 50 feet activity   Assist           Walk 150 feet activity   Assist           Walk 10 feet on uneven surface  activity   Assist           Wheelchair     Assist               Wheelchair 50 feet with 2 turns activity    Assist            Wheelchair 150 feet activity     Assist          Blood pressure (!) 149/58, pulse 62, temperature 98.2 F (36.8 C), resp. rate 18, height 5' 9 (1.753 m), weight 82.6 kg, SpO2 95%.  Medical Problem List and Plan: 1. 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  Antithrombotics: -DVT/anticoagulation:  Pharmaceutical: Eliquis              -antiplatelet therapy: ASA  3. Pain Management: Tylenol  prn.  4. Mood/Behavior/Sleep: LCSW to follow for evaluation and support.              -antipsychotic agents: N/A             --Hx of insomnia/LBP and on valium  bid prn.  5. Neuropsych/cognition: This patient is capable of making decisions on his own behalf.  6. Constipation: add on magnesium  level, magneisum oxide 200mg  daily started   7. Fluids/Electrolytes/Nutrition: Monitor I/O.   8.  Acute on chronic renal failure: Encourage fluid intake. SCr 1.3 @ admission and trending up to 1.46              --recheck renal status in am.   9. CAD s/p: Has hx of presyncope in the past. Continue Lipitor and ASA.              --monitor of orthostatic symptoms also.   10.  HTN: Monitor BP TID--on Amlodipine  10 mg, Lotensin  20 mg bid, Hydralazine  25 mg TID, Tenormin  75 mg, magnesium  oxide 200mg  daily ordered              11. PAF: Rate controlled and monitor for symptoms with increase in activity. --on atenolol  daily  12. Vestibular dysfunction:Dizziness: Therapy to continue to work on VOR. --continue Meclizine  prn.  LOS: 1 days A FACE TO FACE EVALUATION WAS PERFORMED  Ankit Degregorio P Avante Carneiro 06/22/2024, 1:02 PM

## 2024-06-22 NOTE — Evaluation (Addendum)
 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 uses for shoulder  pain   Paroxysmal atrial fibrillation (HCC)    Personal history of colonic polyps    Pulmonary emboli (HCC)    02-2014   RAS (renal artery stenosis) 05-2012    Renal insufficiency    SCC (squamous cell carcinoma)    Past Surgical History:  Past Surgical History:  Procedure Laterality Date   CARDIAC CATHETERIZATION     CATARACT EXTRACTION     CHOLECYSTECTOMY N/A 02/15/2014   Procedure: LAPAROSCOPIC CHOLECYSTECTOMY with IOC;  Surgeon: Camellia CHRISTELLA Blush, MD;  Location: [facility] OR;  Service: General;  Laterality: N/A;   COLONOSCOPY     CORONARY ARTERY BYPASS GRAFT  1995   ESOPHAGOGASTRODUODENOSCOPY N/A 03/02/2014   Procedure: ESOPHAGOGASTRODUODENOSCOPY (EGD);  Surgeon: Gwendlyn ONEIDA Buddy, MD;  Location[facility] ENDOSCOPY;  Service: Endoscopy;  Laterality: N/A;  sarah/leone    Assessment & Plan Clinical Impression: ORIEL RUMBOLD is an 83 year old RH-male with history of CAD, CKD II, PAF,  PE/DVT, HOH, RAS who was admitted on 06/15/24 with sudden onset of N/V with vertigo night prior to admission and was on floor all right till next day when he was able to finally crawl and call family. CT head done revealing acute to subacute infarcts in posterior medial right temporal, right occipital, right cerebellum and potentially within brainstem. CTA head/neck showed multiple PCA infarcts, negative for LVO and occlusion of right VA V1 and V2 segments with reconstitution of distal segments. WBC elevated at 18.8 and BNP 242.6. MRI brain showed multiple PCA infarcts of varying age, large acute to subacute right cerebellar, left temporo-occipital lobe and large older subacute R-PCA infarct and small late subacute infarcts in left cerebellum. .    Stroke felt to be in setting of A fib and non compliance with eliquis . Plavix  added briefly with Follow up CT head done 12/05 showing mild worsening of edema with petechial hemorrhage and mildly increased effacement of 4th ventricle with slight dilatation of lateral and third ventricles question early hydrocephalus. He was transitioned to Eliquis  and Plavix  discontinued.  2 D echo showed EF 60-65%. Follow up labs showed acute on chronic renal failure He continues to be limited by dizzines and balance deficits. PT/OT/ST has been working with patient who requires min to mod assist with mobility, min assist with ADLs and mild cognitive linguistic deficits noted with difficulty following multistep commands.  [name]  He lives alone and was independent PTA. Family lives near by and will assist after discharge.   Patient currently requires min with mobility secondary to muscle weakness and decreased coordination.  Prior to hospitalization, patient was independent  with mobility and lived with Alone in a House home.  Home access is 5-6Stairs to enter.  Patient will benefit from skilled PT intervention to maximize safe functional mobility, minimize fall risk, and decrease  caregiver burden for planned discharge home with 24 hour supervision.  Anticipate patient will benefit from follow up OP at discharge.  PT - End of Session Activity Tolerance: Tolerates 30+ min activity with multiple rests Endurance Deficit: Yes PT Assessment Rehab Potential (ACUTE/IP ONLY): Excellent PT Barriers to Discharge: None PT Patient demonstrates impairments in the following area(s): Balance;Endurance;Motor PT Transfers Functional Problem(s): Bed Mobility;Bed to Chair;Car;Furniture;Floor PT Locomotion Functional Problem(s): Ambulation;Stairs PT Plan PT Intensity: Minimum of 1-2 x/day ,45 to 90 minutes PT Frequency: 5 out of 7 days PT Duration Estimated Length of Stay: 5-7 days PT Treatment/Interventions: Ambulation/gait training;Community reintegration;Neuromuscular re-education;Stair training;UE/LE Strength taining/ROM;UE/LE Coordination activities;Therapeutic Activities;Discharge planning;Balance/vestibular training;Therapeutic Exercise;Patient/family education;Functional mobility training PT Transfers Anticipated Outcome(s): mod I PT Locomotion Anticipated Outcome(s): Mod I PT Recommendation Follow Up Recommendations: Outpatient PT Patient destination: Home Equipment Details: pt had no DME PTA, but son states shower chair, W/C, crutches, RW available if needed.   PT Evaluation Precautions/Restrictions Precautions Precautions: Fall Precaution/Restrictions Comments: HoH Restrictions Weight Bearing Restrictions Per Provider Order: No General Chart Reviewed: Yes Family/Caregiver Present: Yes Vital SignsTherapy Vitals Temp: 98.1 F (36.7 C) Temp Source: Oral Pulse Rate: 76 Resp: 16 BP: 131/62 Patient Position (if appropriate): Lying Oxygen Therapy SpO2: 96 % O2 Device: Room Air Pain Pain Assessment Pain Scale: 0-10 Pain Score: 0-No pain Pain Interference Pain Interference Pain Effect on Sleep: 0. Does not apply - I have not had any pain or hurting in the past  5 days Pain Interference with Therapy Activities: 0. Does not apply - I have not received rehabilitationtherapy in the past 5 days Pain Interference with Day-to-Day Activities: 1. Rarely or not at all Home Living/Prior Functioning Home Living Living Arrangements: Alone Available Help at Discharge: Family;Friend(s);Available 24 hours/day Type of Home: House Home Access: Stairs to enter [employer] of Steps: 5-6 Entrance Stairs-Rails: Left Home Layout: Two level;Bed/bath upstairs Alternate Level Stairs-Number of Steps: 14 Alternate Level Stairs-Rails: Right Bathroom Shower/Tub: Tub/shower unit Additional Comments: pt may D/C home w/ son staying w/ him or to son's home.  Lives With: Alone Prior Function Level of Independence: Independent with transfers;Independent with gait  Able to Take Stairs?: Reciprically Driving: Yes Vocation: Retired Vision/Perception     Cognition Overall Cognitive Status: Within Functional Limits for tasks assessed Arousal/Alertness: Awake/alert Orientation Level: Oriented X4 Year: 2025 Month: December Day of Week: Correct Attention: Focused;Sustained;Selective Focused Attention: Appears intact Sustained Attention: Appears intact Selective Attention: Appears intact Memory: Appears intact Awareness: Impaired Awareness Impairment: Emergent impairment Problem Solving: Impaired Problem Solving Impairment: Verbal complex;Functional complex Executive Function: Self Monitoring;Self Correcting Self Monitoring: Impaired Self Monitoring Impairment: Functional complex Self Correcting: Appears intact Safety/Judgment: Appears intact Sensation Sensation Light Touch: Appears Intact Coordination Gross Motor Movements are Fluid and Coordinated: No Heel Shin Test: [facility] Motor  Motor Motor: Within Functional Limits Motor - Skilled Clinical Observations: mild hemi L   Trunk/Postural Assessment  Cervical Assessment Cervical Assessment: Exceptions to  [facility] (forward head) Thoracic Assessment Thoracic Assessment: Exceptions to [facility] (rounded shoulders) Lumbar Assessment Lumbar Assessment: Within Functional Limits Postural Control Postural Control: Within  Functional Limits  Balance Balance Balance Assessed: Yes Static Sitting Balance Static Sitting - Balance Support: Feet supported Static Sitting - Level of Assistance: 5: Stand by assistance Dynamic Sitting Balance Dynamic Sitting - Balance Support: Feet supported Dynamic Sitting - Level of Assistance: 5: Stand by assistance Static Standing Balance Static Standing - Balance Support: Bilateral upper extremity supported Static Standing - Level of Assistance: 5: Stand by assistance;4: Min assist Dynamic Standing Balance Dynamic Standing - Balance Support: Bilateral upper extremity supported Dynamic Standing - Level of Assistance: 4: Min assist;5: Stand by assistance Extremity Assessment      RLE Assessment RLE Assessment: Within Functional Limits General Strength Comments: grossly 5/5 LLE Assessment LLE Assessment: Within Functional Limits General Strength Comments: grossly 5/5  Care Tool Care Tool Bed Mobility Roll left and right activity   Roll left and right assist level: Supervision/Verbal cueing    Sit to lying activity   Sit to lying assist level: Supervision/Verbal cueing    Lying to sitting on side of bed activity   Lying to sitting on side of bed assist level: the ability to move from lying on the back to sitting on the side of the bed with no back support.: Supervision/Verbal cueing     Care Tool Transfers Sit to stand transfer   Sit to stand assist level: Contact Guard/Touching assist    Chair/bed transfer   Chair/bed transfer assist level: Contact Guard/Touching assist    Car transfer   Car transfer assist level: Contact Guard/Touching assist      Care Tool Locomotion Ambulation   Assist level: Contact Guard/Touching assist Assistive device:  Walker-rolling Max distance: 150  Walk 10 feet activity   Assist level: Contact Guard/Touching assist Assistive device: Walker-rolling   Walk 50 feet with 2 turns activity   Assist level: Contact Guard/Touching assist Assistive device: Walker-rolling  Walk 150 feet activity   Assist level: Contact Guard/Touching assist Assistive device: Walker-rolling  Walk 10 feet on uneven surfaces activity   Assist level: Contact Guard/Touching assist Assistive device: Other (comment) (no device.)  Stairs   Assist level: Contact Guard/Touching assist Stairs assistive device: 1 hand rail Max number of stairs: 16  Walk up/down 1 step activity   Walk up/down 1 step (curb) assist level: Contact Guard/Touching assist Walk up/down 1 step or curb assistive device: 1 hand rail  Walk up/down 4 steps activity   Walk up/down 4 steps assist level: Contact Guard/Touching assist Walk up/down 4 steps assistive device: 1 hand rail  Walk up/down 12 steps activity   Walk up/down 12 steps assist level: Contact Guard/Touching assist Walk up/down 12 steps assistive device: 1 hand rail  Pick up small objects from floor   Pick up small object from the floor assist level: Minimal Assistance - Patient > 75% Pick up small object from the floor assistive device: no device.  Wheelchair Is the patient using a wheelchair?: No          Wheel 50 feet with 2 turns activity      Wheel 150 feet activity        Refer to Care Plan for Long Term Goals  SHORT TERM GOAL WEEK 1 PT Short Term Goal 1 (Week 1): STG=LTG 2/2 ELOS  Recommendations for other services: None   Skilled Therapeutic Intervention Evaluation completed (see details above and below) with education on PT POC and goals and individual treatment initiated with focus on  strengthening, balance, transfers, gait, stairs and endurance.  Pt presents supine in bed and  agreeable to therapy.  Pt performs bed mobility w/ supervision from flat bed w/o use of bed  rails.  Pt transfers sit to stand and then step-pivot to chair w/ CGA and w/o AD.  Pt amb 150' w/ RW and CGA, occasional cues for BOS and posture.  Pt performed car transfer w/ CGA, and then amb up/down ramp w/ CGA and w/o AD.  Pt negotiated 4 steps w/ L railing only and then 16 steps w/ r rail and CGA, reciprocal gait pattern to simulate home.  Pt amb multiple trials w/o AD and CGA including turns to challenge balance w/ occasional LOB to left.  Pt returned to room and son trained in amb to BR w/ RW for sign-off to participate during day.  Nursing notified of pt son training.  Pt remained supine in bed and all needs in reach, son present.    Mobility Bed Mobility Bed Mobility: Rolling Right;Rolling Left;Supine to Sit;Sit to Supine Rolling Right: Supervision/verbal cueing Rolling Left: Supervision/Verbal cueing Supine to Sit: Supervision/Verbal cueing (log roll technique) Sit to Supine: Supervision/Verbal cueing Transfers Transfers: Sit to Stand;Stand to Sit;Transfer;Stand Pivot Transfers Sit to Stand: Contact Guard/Touching assist Stand to Sit: Contact Guard/Touching assist Stand Pivot Transfers: Contact Guard/Touching assist Transfer (Assistive device): None Locomotion  Gait Ambulation: Yes Gait Assistance: Contact Guard/Touching assist Gait Distance (Feet): 150 Feet Assistive device: Rolling walker Gait Gait: Yes Gait Pattern: Step-through pattern;Narrow base of support Stairs / Additional Locomotion Stairs: Yes Stairs Assistance: Contact Guard/Touching assist Stair Management Technique: One rail Right;Alternating pattern Number of Stairs: 14 Height of Stairs: 6 Ramp: Contact Guard/touching assist Curb: Contact Guard/Touching assist Wheelchair Mobility Wheelchair Mobility: No   Discharge Criteria: Patient will be discharged from PT if patient refuses treatment 3 consecutive times without medical reason, if treatment goals not met, if there is a change in medical status, if  patient makes no progress towards goals or if patient is discharged from hospital.  The above assessment, treatment plan, treatment alternatives and goals were discussed and mutually agreed upon: by patient  Reyes SHAUNNA Sierra 06/22/2024, 2:52 PM

## 2024-06-22 NOTE — Evaluation (Signed)
 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 uses for shoulder  pain   Paroxysmal atrial fibrillation (HCC)    Personal history of colonic polyps    Pulmonary emboli (HCC)    02-2014   RAS (renal artery stenosis) 05-2012    Renal insufficiency    SCC (squamous cell carcinoma)    Past Surgical History:  Past Surgical History:  Procedure Laterality Date   CARDIAC CATHETERIZATION     CATARACT EXTRACTION     CHOLECYSTECTOMY N/A 02/15/2014   Procedure: LAPAROSCOPIC CHOLECYSTECTOMY with IOC;  Surgeon: Camellia CHRISTELLA Blush, MD;  Location: [facility] OR;  Service: General;  Laterality: N/A;   COLONOSCOPY     CORONARY ARTERY BYPASS GRAFT  1995   ESOPHAGOGASTRODUODENOSCOPY N/A 03/02/2014   Procedure: ESOPHAGOGASTRODUODENOSCOPY (EGD);  Surgeon: Gwendlyn ONEIDA Buddy, MD;  Location: [facility] ENDOSCOPY;  Service: Endoscopy;  Laterality: N/A;  sarah/leone    Assessment & Plan Clinical Impression: Patient is a 83 y.o. RH-male with history of CAD, CKD II, PAF, PE/DVT, HOH, RAS who was  admitted on 06/15/24 with sudden onset of N/V with vertigo night prior to admission and was on floor all right till next day when he was able to finally crawl and call family. CT head done revealing acute to subacute infarcts in posterior medial right temporal, right occipital, right cerebellum and potentially within brainstem. CTA head/neck showed multiple PCA infarcts, negative for LVO and occlusion of right VA V1 and V2 segments with reconstitution of distal segments. WBC elevated at 18.8 and BNP 242.6. MRI brain showed multiple PCA infarcts of varying age, large acute to subacute right cerebellar, left temporo-occipital lobe and large older subacute R-PCA infarct and small late subacute infarcts in left cerebellum. .    Stroke felt to be in setting of A fib and non compliance with eliquis . Plavix  added briefly with Follow up CT head done 12/05 showing mild worsening of edema with petechial hemorrhage and mildly increased effacement of 4th ventricle with slight dilatation of lateral and third ventricles question early hydrocephalus. He was transitioned to Eliquis  and Plavix  discontinued.  2 D echo showed EF 60-65%. Follow up labs showed acute on chronic renal failure He continues to be limited by dizzines and balance deficits. PT/OT/ST has been working with patient who requires min to mod assist with mobility, min assist with ADLs and mild cognitive linguistic deficits noted with difficulty following multistep commands.  [name] He lives alone and  was independent PTA. Family lives near by and will assist after discharge.   Patient currently requires min with basic self-care skills secondary to muscle weakness, decreased cardiorespiratoy endurance, and decreased sitting balance, decreased balance strategies, and difficulty maintaining precautions.  Prior to hospitalization, patient could complete ADLS, driving , IADLS with modified independent .  Patient will benefit from skilled intervention to decrease level of  assist with basic self-care skills, increase independence with basic self-care skills, and increase level of independence with iADL prior to discharge home with care partner.  Anticipate patient will require intermittent supervision and follow up home health.  OT - End of Session Activity Tolerance: Tolerates 30+ min activity with multiple rests OT Assessment Rehab Potential (ACUTE ONLY): Good OT Barriers to Discharge: Inaccessible home environment OT Barriers to Discharge Comments: 14 steps OT Patient demonstrates impairments in the following area(s): Balance;Endurance;Motor;Safety OT Basic ADL's Functional Problem(s): Bathing;Dressing;Toileting;Grooming OT Advanced ADL's Functional Problem(s): Simple Meal Preparation;Light Housekeeping OT Transfers Functional Problem(s): Tub/Shower OT Additional Impairment(s): None OT Plan OT Intensity: Minimum of 1-2 x/day, 45 to 90 minutes OT Frequency: 5 out of 7 days OT Treatment/Interventions: Balance/vestibular training;Disease mangement/prevention;Neuromuscular re-education;Self Care/advanced ADL retraining;Therapeutic Exercise;Visual/perceptual remediation/compensation;Therapeutic Activities;Psychosocial support;Functional mobility training;Discharge planning;UE/LE Coordination activities;Patient/family education;Community reintegration;UE/LE Strength taining/ROM;DME/adaptive equipment instruction;Cognitive remediation/compensation OT Self Feeding Anticipated Outcome(s): mod I OT Basic Self-Care Anticipated Outcome(s): mod I OT Toileting Anticipated Outcome(s): mod I OT Bathroom Transfers Anticipated Outcome(s): mod I OT Recommendation Equipment Recommended: Tub/shower bench Equipment Details: has cane walker wc   OT Evaluation Precautions/Restrictions  Precautions Precautions: Fall Recall of Precautions/Restrictions: Intact Precaution/Restrictions Comments: HoH Restrictions Weight Bearing Restrictions Per Provider Order: No General Chart  Reviewed: Yes PT Missed Treatment Reason: Not applicable Response to Previous Treatment: Not applicable Family/Caregiver Present: Yes Vital Signs   Pain Pain Assessment Pain Scale: 0-10 Pain Score: 0-No pain Home Living/Prior Functioning Home Living Family/patient expects to be discharged to:: Private residence Living Arrangements: Alone Available Help at Discharge: Family, Friend(s), Available 24 hours/day Type of Home: House Home Access: Stairs to enter Secretary/administrator of Steps: 5-6 Entrance Stairs-Rails: Left Home Layout: Two level, Able to live on main level with bedroom/bathroom, 1/2 bath on main level Alternate Level Stairs-Number of Steps: 14 Alternate Level Stairs-Rails: Right Bathroom Shower/Tub: Engineer, Manufacturing Systems: Standard Additional Comments: Pt will either d/c to his home (above) or his son's  Lives With: Alone IADL History Homemaking Responsibilities: Yes Laundry Responsibility: Primary Cleaning Responsibility: Primary Bill Paying/Finance Responsibility: Primary Shopping Responsibility: Primary Child Care Responsibility: No Current License: Yes Education: completed up to 10th grade Prior Function Level of Independence: Independent with basic ADLs, Independent with gait, Independent with homemaking with ambulation  Able to Take Stairs?: Yes Driving: Yes Vocation: Retired Administrator, Sports Baseline Vision/History: 1 Wears glasses Ability to See in Adequate Light: 1 Impaired Patient Visual Report: No change from baseline Vision Assessment?: No apparent visual deficits Perception  Perception: Within Functional Limits Praxis Praxis: WFL Cognition Cognition Overall Cognitive Status: Impaired/Different from baseline Arousal/Alertness: Awake/alert Problem Solving: Appears intact Safety/Judgment: Appears intact Brief Interview for Mental Status (BIMS) Repetition of Three Words (First Attempt): 3 Temporal Orientation: Year: Correct Temporal  Orientation: Month: Accurate within 5 days Temporal Orientation: Day: Correct Recall: Sock: Yes, no cue required Recall: Blue: Yes, after cueing (a color) Recall: Bed: Yes, no cue required BIMS Summary Score: 14 Sensation Sensation Light Touch: Appears Intact Coordination Gross Motor Movements are Fluid and Coordinated: No Fine Motor Movements are Fluid and Coordinated: Yes Finger Nose Finger Test: [facility] Motor  Motor Motor: Within Functional Limits  Trunk/Postural Assessment  Cervical Assessment Cervical Assessment: Exceptions to [facility] (foward head) Thoracic Assessment Thoracic Assessment: Exceptions to [facility] (rounded shoulders) Lumbar Assessment Lumbar Assessment: Within Functional Limits Postural Control Postural Control: Within Functional Limits  Balance Balance Balance Assessed: Yes Dynamic Sitting Balance Sitting balance - Comments: sitting unsupported from back rest in chair Extremity/Trunk Assessment RUE Assessment RUE Assessment: Within Functional Limits LUE Assessment LUE Assessment: Within Functional Limits  Care Tool Care Tool Self Care Eating   Eating Assist Level: Set up assist    Oral Care    Oral Care Assist Level: Set up assist    Bathing   Body parts bathed by patient: Right arm;Left arm;Chest;Abdomen;Front perineal area;Buttocks;Right upper leg;Left upper leg;Right lower leg;Left lower leg;Face     Assist Level: Contact Guard/Touching assist    Upper Body Dressing(including orthotics)   What is the patient wearing?: Pull over shirt   Assist Level: Supervision/Verbal cueing    Lower Body Dressing (excluding footwear)   What is the patient wearing?: Pants;Underwear/pull up Assist for lower body dressing: Contact Guard/Touching assist    Putting on/Taking off footwear   What is the patient wearing?: Socks Assist for footwear: Set up assist       Care Tool Toileting Toileting activity   Assist for toileting: Contact Guard/Touching  assist     Care Tool Bed Mobility Roll left and right activity   Roll left and right assist level: Supervision/Verbal cueing    Sit to lying activity   Sit to lying assist level: Supervision/Verbal cueing    Lying to sitting on side of bed activity   Lying to sitting on side of bed assist level: the ability to move from lying on the back to sitting on the side of the bed with no back support.: Supervision/Verbal cueing     Care Tool Transfers Sit to stand transfer   Sit to stand assist level: Contact Guard/Touching assist    Chair/bed transfer   Chair/bed transfer assist level: Contact Guard/Touching assist     Toilet transfer   Assist Level: Contact Guard/Touching assist     Care Tool Cognition  Expression of Ideas and Wants Expression of Ideas and Wants: 4. Without difficulty (complex and basic) - expresses complex messages without difficulty and with speech that is clear and easy to understand  Understanding Verbal and Non-Verbal Content Understanding Verbal and Non-Verbal Content: 4. Understands (complex and basic) - clear comprehension without cues or repetitions   Memory/Recall Ability Memory/Recall Ability : Current season;That he or she is in a hospital/hospital unit   Refer to Care Plan for Long Term Goals  SHORT TERM GOAL WEEK 1 OT Short Term Goal 1 (Week 1): LTG =STG d/t ELOS  Recommendations for other services: None    Skilled Therapeutic Intervention  Evaluation completed (see details above) with education on OT POC and goals and individual treatment initiated with focus on functional mobility/transfers, ADL re-training,  generalized strengthening and endurance, dynamic standing balance/coordination, pt/family education and discharge planning. Pt education provided on therapy schedule and safety policy with use of chair alarm. Pt participated in goal setting. Pt participated in modified ADL task (See above for functional performance). Patient completed B &D with  overall CGA to SUP level of assist. Patient able to shower, dress, and complete grooming all safely. Patient completed functional mobility 125 ft x2 to ADL apartment to complete tub shower transfer overall CGA to SUP A. Returned to room all needs in reach alarm on.  ADL ADL Equipment Provided: Reacher Eating: Modified independent Grooming: Supervision/safety Where Assessed-Grooming: Standing at sink Upper Body Bathing: Supervision/safety Where Assessed-Upper Body Bathing: Shower Lower Body Bathing: Supervision/safety;Contact guard Where Assessed-Lower Body Bathing: Shower Upper Body Dressing: Setup Where Assessed-Upper Body Dressing: Chair Lower Body Dressing: Supervision/safety;Contact guard Where Assessed-Lower Body Dressing: Chair Toileting: Supervision/safety Where Assessed-Toileting: Teacher, Adult Education: Close supervision;Contact guard Statistician Method: Proofreader: Acupuncturist: Administrator, Arts Method: Warden/ranger: Shower seat without back Mobility  Bed Mobility Bed Mobility: Rolling Right;Rolling Left;Supine to Sit;Sit to Supine Rolling Right: Supervision/verbal cueing Rolling Left: Supervision/Verbal cueing Supine to Sit: Supervision/Verbal cueing Sit to Supine: Supervision/Verbal cueing Transfers Sit to Stand: Supervision/Verbal cueing;Contact Guard/Touching assist Stand to Sit: Supervision/Verbal cueing;Contact Guard/Touching assist   Discharge Criteria: Patient will be discharged from OT if patient refuses treatment 3 consecutive times without medical reason, if treatment goals not met, if there is a change in medical status, if patient makes no progress towards goals or if patient is discharged from hospital.  The above assessment, treatment plan, treatment alternatives and goals were discussed and mutually agreed upon: by patient and by family  D'mariea L  Janki Dike 06/22/2024, 9:47 AM

## 2024-06-22 NOTE — Plan of Care (Signed)
 Problem: RH Balance Goal: LTG: Patient will maintain dynamic sitting balance (OT) Description: LTG:  Patient will maintain dynamic sitting balance with assistance during activities of daily living (OT) Flowsheets (Taken 06/22/2024 1024) LTG: Pt will maintain dynamic sitting balance during ADLs with: Independent with assistive device Goal: LTG Patient will maintain dynamic standing with ADLs (OT) Description: LTG:  Patient will maintain dynamic standing balance with assist during activities of daily living (OT)  Flowsheets (Taken 06/22/2024 1024) LTG: Pt will maintain dynamic standing balance during ADLs with: Independent with assistive device   Problem: Sit to Stand Goal: LTG:  Patient will perform sit to stand in prep for activites of daily living with assistance level (OT) Description: LTG:  Patient will perform sit to stand in prep for activites of daily living with assistance level (OT) Flowsheets (Taken 06/22/2024 1024) LTG: PT will perform sit to stand in prep for activites of daily living with assistance level: Independent with assistive device   Problem: RH Grooming Goal: LTG Patient will perform grooming w/assist,cues/equip (OT) Description: LTG: Patient will perform grooming with assist, with/without cues using equipment (OT) Flowsheets (Taken 06/22/2024 1024) LTG: Pt will perform grooming with assistance level of: Independent with assistive device    Problem: RH Bathing Goal: LTG Patient will bathe all body parts with assist levels (OT) Description: LTG: Patient will bathe all body parts with assist levels (OT) Flowsheets (Taken 06/22/2024 1024) LTG: Pt will perform bathing with assistance level/cueing: Independent with assistive device    Problem: RH Dressing Goal: LTG Patient will perform upper body dressing (OT) Description: LTG Patient will perform upper body dressing with assist, with/without cues (OT). Flowsheets (Taken 06/22/2024 1024) LTG: Pt will perform upper body  dressing with assistance level of: Independent with assistive device Goal: LTG Patient will perform lower body dressing w/assist (OT) Description: LTG: Patient will perform lower body dressing with assist, with/without cues in positioning using equipment (OT) Flowsheets (Taken 06/22/2024 1024) LTG: Pt will perform lower body dressing with assistance level of: Independent with assistive device   Problem: RH Toileting Goal: LTG Patient will perform toileting task (3/3 steps) with assistance level (OT) Description: LTG: Patient will perform toileting task (3/3 steps) with assistance level (OT)  Flowsheets (Taken 06/22/2024 1024) LTG: Pt will perform toileting task (3/3 steps) with assistance level: Independent with assistive device   Problem: RH Simple Meal Prep Goal: LTG Patient will perform simple meal prep w/assist (OT) Description: LTG: Patient will perform simple meal prep with assistance, with/without cues (OT). Flowsheets (Taken 06/22/2024 1024) LTG: Pt will perform simple meal prep with assistance level of: Independent with assistive device   Problem: RH Laundry Goal: LTG Patient will perform laundry w/assist, cues (OT) Description: LTG: Patient will perform laundry with assistance, with/without cues (OT). Flowsheets (Taken 06/22/2024 1024) LTG: Pt will perform laundry with assistance level of: Independent with assistive device   Problem: RH Light Housekeeping Goal: LTG Patient will perform light housekeeping w/assist (OT) Description: LTG: Patient will perform light housekeeping with assistance, with/without cues (OT). Flowsheets (Taken 06/22/2024 1024) LTG: Pt will perform light housekeeping with assistance level of: Independent with assistive device   Problem: RH Toilet Transfers Goal: LTG Patient will perform toilet transfers w/assist (OT) Description: LTG: Patient will perform toilet transfers with assist, with/without cues using equipment (OT) Flowsheets (Taken 06/22/2024  1024) LTG: Pt will perform toilet transfers with assistance level of: Independent with assistive device   Problem: RH Tub/Shower Transfers Goal: LTG Patient will perform tub/shower transfers w/assist (OT) Description: LTG: Patient  will perform tub/shower transfers with assist, with/without cues using equipment (OT) Flowsheets (Taken 06/22/2024 1024) LTG: Pt will perform tub/shower stall transfers with assistance level of: Independent with assistive device

## 2024-06-23 MED ORDER — POLYETHYLENE GLYCOL 3350 17 G PO PACK
17.0000 g | PACK | Freq: Every day | ORAL | Status: DC
  Administered 2024-06-23 – 2024-06-26 (×4): 17 g via ORAL
  Filled 2024-06-23 (×7): qty 1

## 2024-06-23 MED ORDER — MAGNESIUM OXIDE -MG SUPPLEMENT 400 (240 MG) MG PO TABS
400.0000 mg | ORAL_TABLET | Freq: Every day | ORAL | Status: DC
  Administered 2024-06-24 – 2024-06-29 (×6): 400 mg via ORAL
  Filled 2024-06-23 (×6): qty 1

## 2024-06-23 MED ORDER — BENAZEPRIL HCL 5 MG PO TABS
10.0000 mg | ORAL_TABLET | Freq: Two times a day (BID) | ORAL | Status: DC
  Administered 2024-06-23: 10 mg via ORAL
  Filled 2024-06-23 (×2): qty 2

## 2024-06-23 NOTE — Discharge Instructions (Addendum)
 Inpatient Rehab Discharge Instructions  Dustin Ashley Discharge date and time:  06/29/24  Activities/Precautions/ Functional Status: Activity: no lifting, driving, or strenuous exercise till cleared by MD Diet: regular diet and low fat, low cholesterol diet Wound Care: none needed   Functional status:  ___ No restrictions     ___ Walk up steps independently ___ 24/7 supervision/assistance   ___ Walk up steps with assistance _X__ Intermittent supervision/assistance  ___ Bathe/dress independently ___ Walk with walker     ___ Bathe/dress with assistance ___ Walk Independently    _X__ Shower independently ___ Walk with assistance    ___ Shower with assistance _X__ No alcohol     ___ Return to work/school ________   Special Instructions: Need to increase fluid intake.  2.   Follow up therapy was declined--you can request this your primary care if you change your mind.     STROKE/TIA DISCHARGE INSTRUCTIONS SMOKING Cigarette smoking nearly doubles your risk of having a stroke & is the single most alterable risk factor  If you smoke or have smoked in the last 12 months, you are advised to quit smoking for your health. Most of the excess cardiovascular risk related to smoking disappears within a year of stopping. Ask you doctor about anti-smoking medications [place] Quit Line: 1-800-QUIT NOW Free Smoking Cessation Classes [phone]  CHOLESTEROL Know your levels; limit fat & cholesterol in your diet  Lipid Panel     Component Value Date/Time   CHOL 117 06/16/2024 0137   TRIG 93 06/16/2024 0137   TRIG 92 06/26/2006 1042   HDL 38 (L) 06/16/2024 0137   CHOLHDL 3.1 06/16/2024 0137   VLDL 19 06/16/2024 0137   LDLCALC 60 06/16/2024 0137     Many patients benefit from treatment even if their cholesterol is at goal. Goal: Total Cholesterol (CHOL) less than 160 Goal:  Triglycerides (TRIG) less than 150 Goal:  HDL greater than 40 Goal:  LDL (LDLCALC) less than 100   BLOOD PRESSURE  American Stroke Association blood pressure target is less that 120/80 mm/Hg  Your discharge blood pressure is:  BP: 138/66 Monitor your blood pressure Limit your salt and alcohol intake Many individuals will require more than one medication for high blood pressure  DIABETES (A1c is a blood sugar average for last 3 months) Goal HGBA1c is under 7% (HBGA1c is blood sugar average for last 3 months)  Diabetes: No known diagnosis of diabetes    Lab Results  Component Value Date   HGBA1C 5.8 (H) 06/16/2024    Your HGBA1c can be lowered with medications, healthy diet, and exercise. Check your blood sugar as directed by your physician Call your physician if you experience unexplained or low blood sugars.  PHYSICAL ACTIVITY/REHABILITATION Goal is 30 minutes at least 4 days per week  Activity: No driving, Therapies: see above Return to work: N/a Activity decreases your risk of heart attack and stroke and makes your heart stronger.  It helps control your weight and blood pressure; helps you relax and can improve your mood. Participate in a regular exercise program. Talk with your doctor about the best form of exercise for you (dancing, walking, swimming, cycling).  DIET/WEIGHT Goal is to maintain a healthy weight  Your discharge diet is:  Diet Order             Diet regular Room service appropriate? Yes; Fluid consistency: Thin  Diet effective now  liquids Your height is:  Height: 5' 9 (175.3 cm) Your current weight is: 182 lbs Your Body Mass Index (BMI) is:  BMI (Calculated): 26.88 Following the type of diet specifically designed for you will help prevent another stroke. Your goal weight  is:  167 lbs Your goal Body Mass Index (BMI) is 19-24. Healthy food habits can help reduce 3 risk factors for stroke:  High cholesterol, hypertension, and excess weight.  RESOURCES Stroke/Support Group:  Call [phone]   STROKE EDUCATION PROVIDED/REVIEWED AND GIVEN TO PATIENT  Stroke warning signs and symptoms How to activate emergency medical system (call 911). Medications prescribed at discharge. Need for follow-up after discharge. Personal risk factors for stroke. Pneumonia vaccine given:  Flu vaccine given:  My questions have been answered, the writing is legible, and I understand these instructions.  I will adhere to these goals & educational materials that have been provided to me after my discharge from the hospital.     My questions have been answered and I understand these instructions. I will adhere to these goals and the provided educational materials after my discharge from the hospital.  Patient/Caregiver Signature _______________________________ Date __________  Clinician Signature _______________________________________ Date __________  Please bring this form and your medication list with you to all your follow-up doctor's appointments.

## 2024-06-23 NOTE — Progress Notes (Signed)
 Physical Therapy Note  Patient Details  Name: Dustin Ashley MRN: [id number] Date of Birth: 1941-02-10 Today's Date: 06/23/2024    Physical Therapist participated in the interdisciplinary team conference, providing clinical information regarding the patient's current status, treatment goals, and weekly focus, including any barriers that need to be addressed. Please see the Inpatient Rehabilitation Team Conference and Plan of Care Update for further details.    Dustin Ashley Dustin Ashley 06/23/2024, 11:09 AM

## 2024-06-23 NOTE — Progress Notes (Signed)
 Occupational Therapy Note  Patient Details  Name: Dustin Ashley MRN: [id number] Date of Birth: April 18, 1941   Occupational Therapist participated in the interdisciplinary team conference, providing clinical information regarding the patient's current status, treatment goals, and weekly focus, including any barriers that need to be addressed. Please see the Inpatient Rehabilitation Team Conference and Plan of Care Update for further details.    [name] M 06/23/2024, 11:14 AM

## 2024-06-23 NOTE — Plan of Care (Signed)
°  Problem: Consults Goal: RH STROKE PATIENT EDUCATION Description: See Patient Education module for education specifics  Outcome: Progressing   Problem: RH SAFETY Goal: RH STG ADHERE TO SAFETY PRECAUTIONS W/ASSISTANCE/DEVICE Description: STG Adhere to Safety Precautions With cues Assistance/Device. Outcome: Progressing   Problem: RH KNOWLEDGE DEFICIT Goal: RH STG INCREASE KNOWLEDGE OF HYPERTENSION Description: Patient and son will be able to manage HTN using educational resources for medications and dietary modification independently Outcome: Progressing Goal: RH STG INCREASE KNOWLEDGE OF DYSPHAGIA/FLUID INTAKE Description: Patient and son will be able to manage dysphagia using educational resources for medications and dietary modification independently Outcome: Progressing Goal: RH STG INCREASE KNOWLEGDE OF HYPERLIPIDEMIA Description: Patient and son will be able to manage HLD using educational resources for medications and dietary modification independently Outcome: Progressing   Problem: RH KNOWLEDGE DEFICIT Goal: RH STG INCREASE KNOWLEDGE OF STROKE PROPHYLAXIS Description: Patient and son will be able to manage secondary risks using educational resources for medications and dietary modification independently Outcome: Progressing

## 2024-06-23 NOTE — Patient Care Conference (Signed)
 Inpatient RehabilitationTeam Conference and Plan of Care Update Date: 06/23/2024   Time: 11:09 AM    Patient Name: Dustin Ashley      Medical Record Number: [id number]  Date of Birth: May 13, 1941 Sex: Male         Room/Bed: 4M11C/4M11C-01 Payor Info: Payor: [employer] MEDICARE / Plan: [facility] MEDICARE / Product Type: *No Product type* /    Admit Date/Time:  06/21/2024  4:23 PM  Primary Diagnosis:  Embolic stroke involving right cerebellar artery [facility])  Hospital Problems: Principal Problem:   Embolic stroke involving right cerebellar artery [facility])    Expected Discharge Date: Expected Discharge Date: 06/29/24  Team Members Present: Physician leading conference: Dr. Sven Elks Social Worker Present: Waverly Gentry, LCSW-A Nurse Present: Barnie Ronde, RN PT Present: Sherlean Perks, PT OT Present: Monica Peacock, OT SLP Present: Rosina Downy, SLP     Current Status/Progress Goal Weekly Team Focus  Bowel/Bladder   Cont x2, Last BM 12-7. Pt would like a stool softener. Takes miralax  @ home   To remain continent and verbalize bowel/bladder needs   Assess bathroom needs Q4 hours and PRN    Swallow/Nutrition/ Hydration               ADL's   min assist BADL/ Supervision -   Mod I   Balance, endurance    Mobility   CGA for functional mobility with a RW, minor LOB to L   mod I  NMR for dynamic standing balance, progressing LRAD for functional mobility, DC planning, family ed if needed    Communication                Safety/Cognition/ Behavioral Observations               Pain   Complains of no pain   To remain pain free   Assess Qshift    Skin   Skin intact   For skin to remain intact  Assess Qshift      Discharge Planning:  Will discharge home alone or with son who will provide 24/7 support as needed. Has most DME if not all in the home. Awaiting therapy follow-up recommendations.   Team Discussion: Patient admitted post right cerebellar CVA  on Eliquis  with no focal weakness but noted balance issues.  Patient on target to meet rehab goals: yes, currently needs close supervision - CGA for ADLs. Able to ambulate up to 150' with CGA and navigate steps using bilateral rails. Goals for discharge set for mod I overall.  *See Care Plan and progress notes for long and short-term goals.   Revisions to Treatment Plan:  N/a   Teaching Needs: Safety, medications, dietary modification, transfers, toileting, etc.   Current Barriers to Discharge: Decreased caregiver support and Home enviroment access/layout  Possible Resolutions to Barriers: Family education OP follow up services     Medical Summary Current Status: CVA, HTN, HLD, constipation  Barriers to Discharge: Medical stability  Barriers to Discharge Comments: CVA, HTN, HLD, constipation Possible Resolutions to [employer] Focus: continue Eliquis , continue benzapril/amlodipine /atenolol , continue statin, continue prune juice and magnesium  oxide, conitnue to monitor blood pressure TID, continue aspirin    Continued Need for Acute Rehabilitation Level of Care: The patient requires daily medical management by a physician with specialized training in physical medicine and rehabilitation for the following reasons: Direction of a multidisciplinary physical rehabilitation program to maximize functional independence : Yes Medical management of patient stability for increased activity during participation in an intensive rehabilitation regime.: Yes  Analysis of laboratory values and/or radiology reports with any subsequent need for medication adjustment and/or medical intervention. : Yes   I attest that I was present, lead the team conference, and concur with the assessment and plan of the team.   Fredericka Sober B 06/23/2024, 4:01 PM

## 2024-06-23 NOTE — Progress Notes (Signed)
 PROGRESS NOTE   Subjective/Complaints: Patient is doing well and team felt he could d/c on Friday or Saturday, son requested d/c on Monday, asked SW to please find out reason for this  ROS: denies pain, last BM 12/7   Objective:   No results found. Recent Labs    06/22/24 0524  WBC 10.8*  HGB 13.8  HCT 40.2  PLT 178   Recent Labs    06/22/24 0524  NA 139  K 3.8  CL 105  CO2 18*  GLUCOSE 103*  BUN 32*  CREATININE 1.45*  CALCIUM  8.5*    Intake/Output Summary (Last 24 hours) at 06/23/2024 1318 Last data filed at 06/23/2024 1200 Gross per 24 hour  Intake 766 ml  Output --  Net 766 ml        Physical Exam: Vital Signs Blood pressure (!) 123/59, pulse 62, temperature 98.4 F (36.9 C), resp. rate 16, height 5' 9 (1.753 m), weight 82.6 kg, SpO2 95%. Gen: no distress, normal appearing HEENT: oral mucosa pink and moist, NCAT Cardio: Reg rate Chest: normal effort, normal rate of breathing Abd: soft, non-distended Ext: no edema Psych: pleasant, normal affect Musculoskeletal:        General: No swelling.     Cervical back: Normal range of motion.     Right lower leg: No edema.     Left lower leg: Edema present.  Skin:    General: Skin is warm and dry.     Findings: Bruising present.  Neurological:     Mental Status: He is alert and oriented to person, place, and time.     Comments: HOH. Able to answer orientation questions and follow simple motor commands. Oriented to person, place, reason, month, day, year, day of week. Provided me his home address. Normal language and speech. CN exam non-focal. MMT: 4+/5 prox to distal in BUE. 4- to 4/5 prox to distal in BLE. Sensory exam normal for light touch and pain in all 4 limbs. No limb ataxia or cerebellar signs but slight impairments in [facility]. No abnormal tone appreciated.  Improved as above 12/10  Psychiatric:        Mood and Affect: Mood normal.        Behavior:  Behavior normal.    Assessment/Plan: 1. Functional deficits which require 3+ hours per day of interdisciplinary therapy in a comprehensive inpatient rehab setting. Physiatrist is providing close team supervision and 24 hour management of active medical problems listed below. Physiatrist and rehab team continue to assess barriers to discharge/monitor patient progress toward functional and medical goals  Care Tool:  Bathing    Body parts bathed by patient: Right arm, Left arm, Chest, Abdomen, Front perineal area, Buttocks, Right upper leg, Left upper leg, Right lower leg, Left lower leg, Face         Bathing assist Assist Level: Contact Guard/Touching assist     Upper Body Dressing/Undressing Upper body dressing   What is the patient wearing?: Pull over shirt    Upper body assist Assist Level: Supervision/Verbal cueing    Lower Body Dressing/Undressing Lower body dressing      What is the patient wearing?: Pants, Underwear/pull up  Lower body assist Assist for lower body dressing: Contact Guard/Touching assist     Toileting Toileting    Toileting assist Assist for toileting: Contact Guard/Touching assist     Transfers Chair/bed transfer  Transfers assist     Chair/bed transfer assist level: Contact Guard/Touching assist     Locomotion Ambulation   Ambulation assist      Assist level: Contact Guard/Touching assist Assistive device: Walker-rolling Max distance: 150   Walk 10 feet activity   Assist     Assist level: Contact Guard/Touching assist Assistive device: Walker-rolling   Walk 50 feet activity   Assist    Assist level: Contact Guard/Touching assist Assistive device: Walker-rolling    Walk 150 feet activity   Assist    Assist level: Contact Guard/Touching assist Assistive device: Walker-rolling    Walk 10 feet on uneven surface  activity   Assist     Assist level: Contact Guard/Touching assist Assistive device:  Other (comment) (no device.)   Wheelchair     Assist Is the patient using a wheelchair?: No             Wheelchair 50 feet with 2 turns activity    Assist            Wheelchair 150 feet activity     Assist          Blood pressure (!) 123/59, pulse 62, temperature 98.4 F (36.9 C), resp. rate 16, height 5' 9 (1.753 m), weight 82.6 kg, SpO2 95%.  Medical Problem List and Plan: 1. 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  Antithrombotics: -DVT/anticoagulation:  Pharmaceutical: continue Eliquis              -antiplatelet therapy: ASA  3. Pain Management: Tylenol  prn .  4. Mood/Behavior/Sleep: LCSW to follow for evaluation and support.              -antipsychotic agents: N/A             --Hx of insomnia/LBP and on valium  bid prn.   5. Neuropsych/cognition: This patient is capable of making decisions on his own behalf.  6. Constipation: magnesium  level reviewed and is 1.8, increase magnesium  oxide to 400mg  daily  7. Fluids/Electrolytes/Nutrition: Monitor I/O.   8.  Acute on chronic renal failure: Encourage fluid intake. SCr 1.3 @ admission and trending up to 1.46              --recheck renal status in am.   9. CAD s/p: Has hx of presyncope in the past. Continue Lipitor and ASA.              --monitor of orthostatic symptoms also.   10.  HTN: Monitor BP TID--on Amlodipine  10 mg, Hydralazine  25 mg TID, Tenormin  75 mg, magneisum oxide increased to 400mg  daily, decrease benzapril to 10mg  BID              11. PAF: Rate controlled and monitor for symptoms with increase in  activity. --continue tenolol daily  12. Vestibular dysfunction:Dizziness: Therapy to continue to work on  VOR. --continue Meclizine  prn.  LOS: 2 days A FACE TO FACE EVALUATION WAS PERFORMED  Dustin Ashley 06/23/2024, 1:18 PM

## 2024-06-23 NOTE — Progress Notes (Signed)
 Physical Therapy Session Note  Patient Details  Name: Dustin Ashley MRN: [id number] Date of Birth: 1940-12-07  Today's Date: 06/23/2024 PT Individual Time: 1600-1655 PT Individual Time Calculation (min): 55 min   Short Term Goals: Week 1:  PT Short Term Goal 1 (Week 1): STG=LTG 2/2 ELOS  Skilled Therapeutic Interventions/Progress Updates:      Pt resting in bed with his son at the bedside - pt has no reports of pain.   Supine<>sitting EOB without assistance. Assisted patient with donning his shoes for time management. Sit<>stand to RW with CGA with slight posterior lean noted.   Ambulated with CGA/SBA and RW ~150' to the main gym, able to keep straight path with slight sway L. Gait training an additional 150' with no AD with CGA to light minA for general steadying but no LOB, continues to trunk lean L slightly. Increased unsteadiness with turns.   Dynamic gait training with obstacle course (stepping over hurdles, walking on compliant surfaces, and stepping up/down 4 curb) with CGA to light minA without UE support. Fair balance reactions but slightly delayed to the L. Added weighted suitcase carries for the same activity with 10# dumbbells in each hand.   Next, worked on engineer, manufacturing systems: *Goblet squats 2x12 with 10# dumbell across chest, supervision *standing heel raises with vs without UE support 2x12 -standing marching with vs without UE support and 5# ankle weights bilaterally 2x12  Treadmill training without BWS support. CGA to SBA for safety while ambulating on treadmill.  3 minutes at 1.0mph with BUE support + 3 minutes at 1. with BUE support. 561ft total without a seated rest break.   Finished session on Nustep for x5 minutes at L7 (fading to L4 after 3 minutes) resistance using BUE and BLE for whole body strengthening and cardiovascular endurance training 325 steps in total.   Pt ambulated back to his room from day room gym, ~300' with  RW and supervision assist. Ended session seated EOB with his son at bedside who was updated on patient's tolerance and therapy session. Son pleased! All needs met.   Therapy Documentation Precautions:  Precautions Precautions: Fall Recall of Precautions/Restrictions: Intact Precaution/Restrictions Comments: [facility] Restrictions Weight Bearing Restrictions Per Provider Order: No General:     Therapy/Group: Individual Therapy  Hughey Rittenberry P Shamira Toutant 06/23/2024, 11:09 AM

## 2024-06-23 NOTE — Progress Notes (Signed)
 Occupational Therapy Session Note  Patient Details  Name: Dustin Ashley MRN: [id number] Date of Birth: [birth year]  Today's Date: 06/23/2024 OT Individual Time: 9264-9154 OT Individual Time Calculation (min): 70 min    Short Term Goals: Week 1:  OT Short Term Goal 1 (Week 1): LTG =STG d/t ELOS  Skilled Therapeutic Interventions/Progress Updates:   Patient received supine in bed with grandson at bedside.  Patient declined shower as he showered yesterday and he did not shower daily at home.  Patient declined changing clothes - as family unable to bring him clean clothes until this afternoon.   Transitioned sit to stand with CGA, patient with A/P sway in standing initially - almost mild ataxic quality to stance.  Patient walked to ADL apartment with CGA to close supervision, left leg appears stiff when walking initially, but with increased speed,  actually looked more natural.     Performed tub/shower transfer with and without DME - tub transfer bench.  Patient surface dependent with Ue's to transfer without transfer bench.  Discussed grab bars.  Patient prefers tub transfer bench for shower.  Darden indicates that family has a tub bench that patient can use post discharge.    Performed shower stall transfers as patient plans discharge to son's home - 1 level / fewer stairs.  Patient could have acces to either tub/shower or shower stall at son's home.  Patient could safely transfer to shower seat with close supervision and cueing.    Worked on sit to surveyor, mining - patient with insufficient forward weight shift initially.  Worked on dynamic stand balance - patient with significantly delayed stepping strategies while tossing and catching ball with grandson.  Worked on walking while using hands to toss/ catch a ball.  Patient reports feeling better overall about balance, and indicates he feels he has adequate support at home.    Returned to room and to bed.  Bed alarm engaged, call bell in  reach, and grandson at bedside.    Therapy Documentation Precautions:  Precautions Precautions: Fall Recall of Precautions/Restrictions: Intact Precaution/Restrictions Comments: [facility] Restrictions Weight Bearing Restrictions Per Provider Order: No   Pain:  Denies pain   Therapy/Group: Individual Therapy  Lachanda Buczek M 06/23/2024, 11:55 AM

## 2024-06-23 NOTE — Progress Notes (Signed)
 Physical Therapy Session Note  Patient Details  Name: Dustin Ashley MRN: [id number] Date of Birth: 10/10/1940  Today's Date: 06/23/2024 PT Individual Time: 1005-1115 PT Individual Time Calculation (min): 70 min   Short Term Goals: Week 1:  PT Short Term Goal 1 (Week 1): STG=LTG 2/2 ELOS  Skilled Therapeutic Interventions/Progress Updates: Pt presented in bed with son present agreeable to therapy. Pt denies pain during session. Pt completed bed mobility with supervision, donned shoes with set up. Pt provided with RW and ambulated to day room with supervision demonstrating good cadence and step length. Pt then ambulated around day room without AD, demonstrating increased sway, slower cadence, slightly uneven step length, and x 1 near LOB which pt was able to correct. Pt also participated in [place] Balance Assessment with a score of 47/56. Patient demonstrates increased fall risk as noted by score of  47 /56 on Berg Balance Scale.  (<36= high risk for falls, close to 100%; 37-45 significant >80%; 46-51 moderate >50%; 52-55 lower >25%). Pt also participated in several rounds of corn hole with contralateral LE on 6in block. Pt able to complete with overall CGA however noted increased instability when weight bearing through LLE v RLE. Pt also participated in gait/balance without AD ambulating ~22ft weaving through cones when ambulating backwards back to start x 3. Pt required min cues for increasing step length when walking backwards. Pt also participated in gait with toe tap to cones requiring initial minA then improving to CGA. Pt completed task of gait around nsg station with pt holding boom sticks to allow PTA to facilitate arm swing. Pt with x 3 episodes of LOB to R requiring minA from PTA for correction. PTA also required increased assist to facilitate arm swing. After seated rest pt ambulated back to room with RW and light CGA with improved gait mechanics vs no AD. Pt left seated EOB at end of session  with call bell within reach and family present.        Therapy Documentation Precautions:  Precautions Precautions: Fall Recall of Precautions/Restrictions: Intact Precaution/Restrictions Comments: [facility] Restrictions Weight Bearing Restrictions Per Provider Order: No General:   Vital Signs:   Pain:   Mobility:   Locomotion :    Trunk/Postural Assessment :    Balance: Standardized Balance Assessment Standardized Balance Assessment: Berg Balance Test Berg Balance Test Sit to Stand: Able to stand without using hands and stabilize independently Standing Unsupported: Able to stand safely 2 minutes Sitting with Back Unsupported but Feet Supported on Floor or Stool: Able to sit safely and securely 2 minutes Stand to Sit: Sits safely with minimal use of hands Transfers: Able to transfer safely, minor use of hands Standing Unsupported with Eyes Closed: Able to stand 10 seconds safely Standing Ubsupported with Feet Together: Able to place feet together independently and stand for 1 minute with supervision From Standing, Reach Forward with Outstretched Arm: Can reach forward >12 cm safely (5) From Standing Position, Pick up Object from Floor: Able to pick up shoe safely and easily From Standing Position, Turn to Look Behind Over each Shoulder: Looks behind one side only/other side shows less weight shift Turn 360 Degrees: Able to turn 360 degrees safely one side only in 4 seconds or less Standing Unsupported, Alternately Place Feet on Step/Stool: Able to stand independently and complete 8 steps >20 seconds Standing Unsupported, One Foot in Front: Able to plae foot ahead of the other independently and hold 30 seconds Standing on One Leg: Tries to lift leg/unable  to hold 3 seconds but remains standing independently Total Score: 47 Exercises:   Other Treatments:      Therapy/Group: Individual Therapy  Danikah Budzik 06/23/2024, 12:28 PM

## 2024-06-23 NOTE — Progress Notes (Signed)
 Patient ID: Dustin Ashley, male   DOB: March 19, 1941, 83 y.o.   MRN: [id number]  Have reviewed team conference with pt and family. Both aware and agreeable with targeted d/c date of 12/16 and goals of Independent with assistive device.  Patient/family declining follow-up therapy recommendations/services.

## 2024-06-24 LAB — BASIC METABOLIC PANEL WITH GFR
Anion gap: 8 (ref 5–15)
BUN: 39 mg/dL — ABNORMAL HIGH (ref 8–23)
CO2: 24 mmol/L (ref 22–32)
Calcium: 8.3 mg/dL — ABNORMAL LOW (ref 8.9–10.3)
Chloride: 106 mmol/L (ref 98–111)
Creatinine, Ser: 1.48 mg/dL — ABNORMAL HIGH (ref 0.61–1.24)
GFR, Estimated: 47 mL/min — ABNORMAL LOW (ref >60)
Glucose, Bld: 93 mg/dL (ref 70–99)
Potassium: 3.6 mmol/L (ref 3.5–5.1)
Sodium: 138 mmol/L (ref 135–145)

## 2024-06-24 LAB — CBC
HCT: 39 % (ref 39.0–52.0)
Hemoglobin: 13.4 g/dL (ref 13.0–17.0)
MCH: 29.6 pg (ref 26.0–34.0)
MCHC: 34.4 g/dL (ref 30.0–36.0)
MCV: 86.3 fL (ref 80.0–100.0)
Platelets: 190 K/uL (ref 150–400)
RBC: 4.52 MIL/uL (ref 4.22–5.81)
RDW: 13.3 % (ref 11.5–15.5)
WBC: 10 K/uL (ref 4.0–10.5)
nRBC: 0 % (ref 0.0–0.2)

## 2024-06-24 MED ORDER — BENAZEPRIL HCL 5 MG PO TABS
5.0000 mg | ORAL_TABLET | Freq: Two times a day (BID) | ORAL | Status: DC
  Administered 2024-06-24 – 2024-06-29 (×10): 5 mg via ORAL
  Filled 2024-06-24 (×10): qty 1

## 2024-06-24 MED ADMIN — Sennosides Tab 8.6 MG: 8.6 mg | ORAL | NDC 00904725280

## 2024-06-24 MED FILL — Sennosides Tab 8.6 MG: 1.0000 | ORAL | Qty: 1 | Status: AC

## 2024-06-24 MED FILL — Sennosides Tab 8.6 MG: 1.0000 | ORAL | Qty: 1 | Status: CN

## 2024-06-24 NOTE — Progress Notes (Signed)
 Occupational Therapy Session Note  Patient Details  Name: Dustin Ashley MRN: [id number] Date of Birth: 12-01-1940  Today's Date: 06/24/2024 OT Individual Time: 8696-8584 OT Individual Time Calculation (min): 72 min    Short Term Goals: Week 1:  OT Short Term Goal 1 (Week 1): LTG =STG d/t ELOS  Skilled Therapeutic Interventions/Progress Updates:    Patient received supine in bed, able to transition to sitting without assistance.  Son present and asking about grab bar demonstrated to patient yesterday.  Walked with son to ADL apartment to show him grab bar, and demonstrate, also to verify they have tub trasnfer bench at home.   Patient walked to shower without walker and initially with CGA fading to close supervision.  Showered standing with bench in shower in the event patient became light headed or needed for function.  Patient able to bathe himself standing without assistance.   Patient dressed himself - lower body while seated, and upper body while standing.    Walked to gym without device to work on dynamic standing balance.  Pivoting, weight shifting, reaching outside BOS, reaching overhead and to floor.  Patient reports mild light headedness when bending to reach to floor.  Symptoms resolved in seconds once seated.  Patient returned to room and left in bed with son at bedside.    Therapy Documentation Precautions:  Precautions Precautions: Fall Recall of Precautions/Restrictions: Intact Precaution/Restrictions Comments: [facility] Restrictions Weight Bearing Restrictions Per Provider Order: No   Pain:  Denies pain   Therapy/Group: Individual Therapy  Sherry Blackard M 06/24/2024, 2:17 PM

## 2024-06-24 NOTE — Progress Notes (Signed)
 Physical Therapy Session Note  Patient Details  Name: Dustin Ashley MRN: [id number] Date of Birth: [birth year]  Today's Date: 06/24/2024 PT Individual Time: 0730-0828 + [id number]  PT Individual Time Calculation (min): 58 min + 55 min  Short Term Goals: Week 1:  PT Short Term Goal 1 (Week 1): STG=LTG 2/2 ELOS  Skilled Therapeutic Interventions/Progress Updates:      1st session: Pt fully dressed and ready for therapy on arrival - his son at the bedside who reports he's been staying the night. Encouraged son to get some rest himself. Pt denies any pain.  Bed mobility completed mod I. Donned tennis shoes with setupA - able to tie them without assist.   Sit<>stand with CGA from bed with no AD or UE support. Ambulated with CGA and no AD from his room to the main gym, 150'. Pt with mild unsteadiness to start, a few corrected minor LOB. Balance improved as session progressed.   NMR completed for BLE strengthening, activity tolerance, and dynamic balance training: -1x15 goblet squats with 10# dumbbell held at chest height -1x15 goblet squats with 10# dumbbell held at chest height + red resistive band around waist -1x15 repeated forward step ups to 8 block in // bars with BUE support -1x15 repeated forward step ups to 8 block in // bars without BUE support with CGA/minA -1x15 repeated lateral step ups to 8 block in // bars with BUE support *Pt reporting mild lightheadedness after this set. BP checked sitting vs standing: Sitting: 104/53 HR 61 Standing: 115/65 HR 63 Standing ~3 minutes: 103/53 HR 74  *pt reports that after intensive exercise, he gets slightly lightheaded and disorganized. Communicated with MD and RN via secure chat  *seated rest breaks as needed b/w sets  Pt instructed in dynamic gait training for remainder of session: -walking without AD or UE support while tapping balloon to himself with minA for balance >250' -walking without AD or UE support while holding  balloon and ambulating backwards 40' with CGA -walking without AD or UE support while PT tapped balloon in unpredictable locations with minA for balance >250'  Pt concluded session seated EOB, son updated on pt's session. Needs met.   2nd session: Pt resting in bed to start - agreeable to therapy treatment. Pt has no reports of pain.   Bed mobility completed mod I. Donned tennis shoes with setupA. Sit<>stand with CGA from bed and no RW - ambulates with no AD or UE support with CGA to close SBA ~300' to day room gym. Assisted onto the Treadmill with CGA without BWS.   Completed treadmill training without BWS with variable parameters: -2 minutes 1.48mph BUE support + 4 minutes 1.19mph NO BUE support = 565ft total -6 minutes lateral stepping R 0.4mph. Progressing from BUE support to NO BUE support for the final 4 minutes - 17ft -6 minutes lateral stepping L 0.90mph. Progressing from BUE support to NO BUE support for the final 4 minutes - 152ft.  *seated rest breaks b/w sets  Pt ambulated back to his room from day room gym with CGA and no AD ~300'. Pt had x1 LOB when distracted from staff member who was saying hello, pt needing minA for balance recovery. Will need to work more on ambulation with head turns and stepping strategies for balance recovery.   Pt ended session seated EOB, needs met. Son updated at bedside.     Therapy Documentation Precautions:  Precautions Precautions: Fall Recall of Precautions/Restrictions: Intact Precaution/Restrictions Comments: HoH Restrictions Weight Bearing  Restrictions Per Provider Order: No General:     Therapy/Group: Individual Therapy  Sherlean SHAUNNA Perks 06/24/2024, 7:16 AM

## 2024-06-24 NOTE — IPOC Note (Signed)
 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)     Functional Problem List: Nursing Safety, Endurance, Medication Management  PT Balance, Endurance, Motor  OT Balance, Endurance, Motor, Safety  SLP    TR         Basic ADLs: OT Bathing, Dressing, Toileting, Grooming     Advanced  ADLs: OT Simple Meal Preparation, Light Housekeeping     Transfers: PT Bed Mobility, Bed to Chair, Set Designer, [employer], Floor  OT Tub/Shower     Locomotion: PT Ambulation, Stairs     Additional Impairments: OT None  SLP        TR      Anticipated Outcomes Item Anticipated Outcome  Self Feeding mod I  Swallowing      Basic self-care  mod I  Toileting  mod I   Bathroom Transfers mod I  Bowel/Bladder  n/a  Transfers  mod I  Locomotion  Mod I  Communication     Cognition     Pain  n/a  Safety/Judgment  manage safety w cues   Therapy Plan: PT Intensity: Minimum of 1-2 x/day ,45 to 90 minutes PT Frequency: 5 out of 7 days PT Duration Estimated Length of Stay: 5-7 days OT Intensity: Minimum of 1-2 x/day, 45 to 90 minutes OT Frequency: 5 out of 7 days OT Duration/Estimated Length of Stay: 5-7 days     Team Interventions: Nursing Interventions Patient/Family Education, Dysphagia/Aspiration Precaution Training, Medication Management, Discharge Planning, Disease Management/Prevention  PT interventions Ambulation/gait training, Community reintegration, Neuromuscular re-education, Stair training, UE/LE Strength taining/ROM, UE/LE Coordination activities, Therapeutic Activities, Discharge planning, Warden/ranger, Therapeutic Exercise, Patient/family education, Functional mobility training  OT Interventions Balance/vestibular training, Disease  mangement/prevention, Neuromuscular re-education, Self Care/advanced ADL retraining, Therapeutic Exercise, Visual/perceptual remediation/compensation, Therapeutic Activities, Psychosocial support, Functional mobility training, Discharge planning, UE/LE Coordination activities, Patient/family education, Community reintegration, UE/LE Strength taining/ROM, Fish Farm Manager, Cognitive remediation/compensation  SLP Interventions    TR Interventions    SW/CM Interventions Discharge Planning, Psychosocial Support, Patient/Family Education, Disease Management/Prevention   Barriers to Discharge MD  Medical stability  Nursing Home environment access/layout, Decreased caregiver support, Medication compliance 2 level main B+B solo; indep, driving, manages medications, cooks and does his own laundry; son to assist at d/c  PT None    OT Inaccessible home environment 14 steps  SLP      SW       Team Discharge Planning: Destination: PT-Home ,OT- Home , SLP-Home Projected Follow-up: PT-Outpatient PT, OT-  Home health OT, SLP-None Projected Equipment Needs: PT- , OT- Tub/shower bench, SLP-None recommended by SLP Equipment Details: PT-pt had no DME PTA, but son states shower chair, W/C, crutches, RW available if needed., OT-has cane walker wc Patient/family involved in discharge planning: PT- Patient, Family adult nurse,  OT-Patient, SLP-Patient, Family member/caregiver  MD ELOS: 7 days Medical Rehab Prognosis:  Excellent Assessment: The patient has been admitted for CIR therapies with the diagnosis of right cerebellar artery embolic stroke.. The team will be addressing functional mobility, strength, stamina, balance, safety, adaptive techniques and equipment, self-care, bowel and bladder mgt, patient and caregiver education. Goals have been set at supervision. Anticipated discharge destination is home.        See Team Conference Notes for weekly updates to the plan of care

## 2024-06-24 NOTE — Plan of Care (Signed)
  Problem: Consults Goal: RH STROKE PATIENT EDUCATION Description: See Patient Education module for education specifics  Outcome: Progressing   Problem: RH SAFETY Goal: RH STG ADHERE TO SAFETY PRECAUTIONS W/ASSISTANCE/DEVICE Description: STG Adhere to Safety Precautions With cues Assistance/Device. Outcome: Progressing

## 2024-06-25 MED ORDER — DOCUSATE SODIUM 100 MG PO CAPS
100.0000 mg | ORAL_CAPSULE | Freq: Every day | ORAL | Status: DC
  Administered 2024-06-25 – 2024-06-26 (×2): 100 mg via ORAL
  Filled 2024-06-25 (×4): qty 1

## 2024-06-25 MED ADMIN — Sennosides Tab 8.6 MG: 8.6 mg | ORAL | NDC 00904725280

## 2024-06-25 NOTE — Progress Notes (Signed)
 Occupational Therapy Session Note  Patient Details  Name: Dustin Ashley MRN: [id number] Date of Birth: 03-Aug-1940  Today's Date: 06/25/2024 OT Individual Time: 0803-0900 OT Individual Time Calculation (min): 57 min    Short Term Goals: Week 1:  OT Short Term Goal 1 (Week 1): LTG =STG d/t ELOS  Skilled Therapeutic Interventions/Progress Updates:   Patient received supine in bed - grandson at bedside.  Patient declined shower - showered yesterday afternoon.  Declined need to toilet.  Walked without device to gym with emphasis on maintaining straight path, and beginning to turn head without loss of balance.   Started in ADL apartment to address tub/shower transfer without transfer bench. Patient able to safely transition by stepping over tub and with use of grab bar - son is obtaining grab bars.   Worked on bed mobility and pacing self with rolling to sitting, sitting to standing - no report of light headedness - patient knows to pause between transitions.   In gym worked on floor recovery with emphasis on unweighting one side to move another - e.g. half kneel to kneel.   Worked on weight shift forward and backward, leftward and rightward - then stepping up/down aerobic step.  Returned to room and left at edge of bed with grandson present.  Scheduled for PT immediately following.    Therapy Documentation Precautions:  Precautions Precautions: Fall Recall of Precautions/Restrictions: Intact Precaution/Restrictions Comments: HoH Restrictions Weight Bearing Restrictions Per Provider Order: No  Pain:  Denies pain     Therapy/Group: Individual Therapy  Ardith Test M 06/25/2024, 11:58 AM

## 2024-06-25 NOTE — Progress Notes (Signed)
 Physical Therapy Session Note  Patient Details  Name: Dustin Ashley MRN: [id number] Date of Birth: 04-Sep-1940  Today's Date: 06/25/2024 PT Individual Time: 0900-0955 PT Individual Time Calculation (min): 55 min   Short Term Goals: Week 1:  PT Short Term Goal 1 (Week 1): STG=LTG 2/2 ELOS  Skilled Therapeutic Interventions/Progress Updates:    Pt presents EOB, just finished OT. Donned shoes independently from EOB including tying laces. Functional gait training on unit without device with overall CGA > 150' and throughout session for functional mobility, strengthening and endurance training.   NMR for balance and coordination retraining: through obstacle course overall CGA to light min A if mild LOB occurred: Navigating cones Stepping over thresholds and larger objects Toe tapping to cone and then stepping over repeated with leading with L and then R   *Compliant surface with functional reaching task to the R and to the L and then pitching horsehoes without UE support with CGA overall  *Biodex to focus on weightshifting, balance and use of visual feedback with BUE support: Limits of stability program: static, level 11, level 8 to increase challenge and focus on weighshifting. Pt requires initial manual facilitation for weightshifting as trying to compensate with head at first Random Control  program: level 10 initially larger circle and progressed all the way to smallest circle though maintained the speed on low   Stair negotiation training for home entry acess practice with single rail (switched between R and L rail only) with overall CGA x 16 steps total   Five times Sit to Stand Test (FTSS) Method: Use a straight back chair with a solid seat that is 16-18 high. Ask participant to sit on the chair with arms folded across their chest.   Instructions: Stand up and sit down as quickly as possible 5 times, keeping your arms folded across your chest.   Measurement: Stop timing  when the participant stands the 5th time.  TIME: __22____ (in seconds)  Times > 13.6 seconds is associated with increased disability and morbidity (Guralnik, 2000) Times > 15 seconds is predictive of recurrent falls in healthy individuals aged 40 and older (Buatois, et al., 2008) Normal performance values in community dwelling individuals aged 78 and older (Bohannon, 2006): 60-69 years: 11.4 seconds 70-79 years: 12.6 seconds 80-89 years: 14.8 seconds  MCID: >= 2.3 seconds for Vestibular Disorders (Meretta, 2006)   Pt required rest breaks throughout session due to fatigue due to back to back therapies but able to participate.   Therapy Documentation Precautions:  Precautions Precautions: Fall Recall of Precautions/Restrictions: Intact Precaution/Restrictions Comments: HoH Restrictions Weight Bearing Restrictions Per Provider Order: No  Pain:  Denies pain.    Therapy/Group: Individual Therapy  [name] [name] [name] Elnor, PT, DPT, CBIS  06/25/2024, 9:58 AM

## 2024-06-25 NOTE — Progress Notes (Signed)
 Physical Therapy Session Note  Patient Details  Name: Dustin Ashley MRN: [id number] Date of Birth: 05/09/1941  Today's Date: 06/25/2024 PT Individual Time: 8692-8668 PT Individual Time Calculation (min): 24 min   Short Term Goals: Week 1:  PT Short Term Goal 1 (Week 1): STG=LTG 2/2 ELOS  Skilled Therapeutic Interventions/Progress Updates:    Functional gait to and from therapy gym x 300' each direction (x2) with overall CGA, occasional narrow BOS and scissoring at times with mild LOB.  NMR to address higher level balance, coordination, and dynamic gait including: Blocked practice sit <> stands with tidal tank x 5 with focus on eccentric control and balance Dynamic gait through obstacle course while holding tidal tank Ball toss in standing and then progressed to sidestepping while tossing and bouncing to the R and the L (20' each direction x 4 total) Retro gait x 25' with min A for higher level balance task  Returned back to room at end of session with all needs in reach and family at bedside.   Therapy Documentation Precautions:  Precautions Precautions: Fall Recall of Precautions/Restrictions: Intact Precaution/Restrictions Comments: HoH Restrictions Weight Bearing Restrictions Per Provider Order: No  Pain: Denies pain.    Therapy/Group: Individual Therapy  Dustin Ashley Dustin, PT, DPT, CBIS  06/25/2024, 1:46 PM

## 2024-06-25 NOTE — Progress Notes (Signed)
 PROGRESS NOTE   Subjective/Complaints: Slept better with valium  last night Discussed that I had decreased Lotensin  yesterday due to hypotension with therapy, diastolic BP is still soft today, BP overall fluctuates  ROS: denies pain, last BM 12/11- small   Objective:   No results found. Recent Labs    06/24/24 0538  WBC 10.0  HGB 13.4  HCT 39.0  PLT 190   Recent Labs    06/24/24 0538  NA 138  K 3.6  CL 106  CO2 24  GLUCOSE 93  BUN 39*  CREATININE 1.48*  CALCIUM  8.3*    Intake/Output Summary (Last 24 hours) at 06/25/2024 1041 Last data filed at 06/25/2024 0858 Gross per 24 hour  Intake 720 ml  Output --  Net 720 ml        Physical Exam: Vital Signs Blood pressure (!) 132/58, pulse 74, temperature 98.2 F (36.8 C), resp. rate 18, height 5' 9 (1.753 m), weight 82.6 kg, SpO2 93%. Gen: no distress, normal appearing HEENT: oral mucosa pink and moist, NCAT Cardio: Reg rate Chest: normal effort, normal rate of breathing Abd: soft, non-distended Ext: no edema Psych: pleasant, normal affect Musculoskeletal:        General: No swelling.     Cervical back: Normal range of motion.     Right lower leg: No edema.     Left lower leg: Edema present.  Skin:    General: Skin is warm and dry.     Findings: Bruising present.  Neurological:     Mental Status: He is alert and oriented to person, place, and time.     Comments: HOH. Able to answer orientation questions and follow simple motor commands. Oriented to person, place, reason, month, day, year, day of week. Provided me his home address. Normal language and speech. CN exam non-focal. MMT: 4+/5 prox to distal in BUE. 4- to 4/5 prox to distal in BLE. Sensory exam normal for light touch and pain in all 4 limbs. No limb ataxia or cerebellar signs but slight impairments in [facility]. No abnormal tone appreciated.  Stable 12/12  Psychiatric:        Mood and Affect: Mood  normal.        Behavior: Behavior normal.    Assessment/Plan: 1. Functional deficits which require 3+ hours per day of interdisciplinary therapy in a comprehensive inpatient rehab setting. Physiatrist is providing close team supervision and 24 hour management of active medical problems listed below. Physiatrist and rehab team continue to assess barriers to discharge/monitor patient progress toward functional and medical goals  Care Tool:  Bathing    Body parts bathed by patient: Right arm, Left arm, Chest, Abdomen, Front perineal area, Buttocks, Right upper leg, Left upper leg, Right lower leg, Left lower leg, Face         Bathing assist Assist Level: Contact Guard/Touching assist     Upper Body Dressing/Undressing Upper body dressing   What is the patient wearing?: Pull over shirt    Upper body assist Assist Level: Supervision/Verbal cueing    Lower Body Dressing/Undressing Lower body dressing      What is the patient wearing?: Pants, Underwear/pull up  Lower body assist Assist for lower body dressing: Contact Guard/Touching assist     Toileting Toileting    Toileting assist Assist for toileting: Contact Guard/Touching assist     Transfers Chair/bed transfer  Transfers assist     Chair/bed transfer assist level: Supervision/Verbal cueing     Locomotion Ambulation   Ambulation assist      Assist level: Contact Guard/Touching assist Assistive device: No Device Max distance: 150   Walk 10 feet activity   Assist     Assist level: Contact Guard/Touching assist Assistive device: No Device   Walk 50 feet activity   Assist    Assist level: Contact Guard/Touching assist Assistive device: No Device    Walk 150 feet activity   Assist    Assist level: Contact Guard/Touching assist Assistive device: No Device    Walk 10 feet on uneven surface  activity   Assist     Assist level: Contact Guard/Touching assist Assistive device:  Other (comment) (no device.)   Wheelchair     Assist Is the patient using a wheelchair?: No             Wheelchair 50 feet with 2 turns activity    Assist            Wheelchair 150 feet activity     Assist          Blood pressure (!) 132/58, pulse 74, temperature 98.2 F (36.8 C), resp. rate 18, height 5' 9 (1.753 m), weight 82.6 kg, SpO2 93%.  Medical Problem List and Plan: 1. 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  Atrial fibrillation: continue Eliquis              -antiplatelet therapy: ASA  3. Pain Management: n/a .  4. Insomnia: valium  restarted with good effect  5. Neuropsych/cognition: This patient is capable of making decisions on his own behalf.  6. Constipation: magnesium  level reviewed and is 1.8, increase magnesium  oxide to 400mg  daily, repeat level Monday, sennakot and colace added daily, LBM 12/11- small  7. Fluids/Electrolytes/Nutrition: Monitor I/O.   8.  Acute on chronic renal failure: repeat creatinine monday  9. CAD s/p: Has hx of presyncope in the past. Continue Lipitor and ASA.              --monitor of orthostatic symptoms also.   10.  HTN: Monitor BP TID--on Amlodipine  10 mg, Hydralazine  25 mg TID, Tenormin  75 mg, magneisum oxide increased to 400mg  daily, decrease benzapril to 5mg  BID, check magnesium  level on monday              11. PAF: Rate controlled and monitor for symptoms with increase in  activity. --continue tenolol daily  12. Vestibular dysfunction:Dizziness: Therapy to continue to work on VOR. --continue Meclizine  prn.  LOS: 4 days A FACE TO FACE EVALUATION WAS PERFORMED  Sven SQUIBB Dunia Pringle 06/25/2024, 10:41 AM

## 2024-06-25 NOTE — Progress Notes (Signed)
 Occupational Therapy Session Note  Patient Details  Name: Dustin Ashley MRN: [id number] Date of Birth: 1940-08-30  Today's Date: 06/25/2024 OT Individual Time: 1107-1202 OT Individual Time Calculation (min): 55 min    Short Term Goals: Week 1:  OT Short Term Goal 1 (Week 1): LTG =STG d/t ELOS  Skilled Therapeutic Interventions/Progress Updates:  Skilled OT intervention completed with focus on functional endurance and dynamic balance. Pt received upright in bed, agreeable to session. No pain reported. OT offered rest breaks for mild dizziness with activity but resolved without further intervention.  Transitioned EOB independently. Donned shoes mod I. Completed all sit > stands and ambulatory transfers without AD and CGA fading to supervision. Mild LOB, but able to recover without increased assist. Pt reports most dizziness occurs with lateral and posterior movements.  Declined self-care. Ambulated about 100 ft > gym. Pt participated in the following dynamic standing balance and endurance tasks to promote independence and safety during BADLs and functional mobility: -alternating toe taps to colored/numbered dots as called out by therapist, while multi tasking transfer of squigz from mat to mirror, reaching outside pt's BOS for greater balance challenge with tandem stance holds. CGA for all sit > stands, with greater difficulty noted on R side and posteriorly. Cues provided for controlling backwards stepping and for creating wider BOS and it's relation to functional activities at home  Pt completed the following intervals on nustep to promote global/cardiovascular endurance needed for independence with BADLs and functional mobility: -10 mins, level 5  Ambulated > room. Remained EOB with family present. Bed alarm on/activated, and with all needs in reach at end of session.   Therapy Documentation Precautions:  Precautions Precautions: Fall Recall of Precautions/Restrictions:  Intact Precaution/Restrictions Comments: HoH Restrictions Weight Bearing Restrictions Per Provider Order: No    Therapy/Group: Individual Therapy  Lorrayne FORBES Fritter, MS, OTR/L  06/25/2024, 3:43 PM

## 2024-06-26 DIAGNOSIS — K5900 Constipation, unspecified: Secondary | ICD-10-CM

## 2024-06-26 DIAGNOSIS — I2510 Atherosclerotic heart disease of native coronary artery without angina pectoris: Secondary | ICD-10-CM

## 2024-06-26 MED ADMIN — Sennosides Tab 8.6 MG: 8.6 mg | ORAL | NDC 00904725280

## 2024-06-26 NOTE — Progress Notes (Signed)
 PROGRESS NOTE   Subjective/Complaints: No new complaints this morning.  No acute events overnight noted. LBM today  ROS: Denies chest pain, shortness of breath, abdominal pain, nausea   Objective:   No results found. Recent Labs    06/24/24 0538  WBC 10.0  HGB 13.4  HCT 39.0  PLT 190   Recent Labs    06/24/24 0538  NA 138  K 3.6  CL 106  CO2 24  GLUCOSE 93  BUN 39*  CREATININE 1.48*  CALCIUM  8.3*    Intake/Output Summary (Last 24 hours) at 06/26/2024 1201 Last data filed at 06/26/2024 0900 Gross per 24 hour  Intake 498 ml  Output --  Net 498 ml        Physical Exam: Vital Signs Blood pressure 129/70, pulse 66, temperature (!) 97.5 F (36.4 C), temperature source Oral, resp. rate 16, height 5' 9 (1.753 m), weight 82.6 kg, SpO2 97%. Gen: no distress, normal appearing HEENT: oral mucosa pink and moist, NCAT Cardio: Reg rate Chest: CTAB, normal effort, normal rate of breathing Abd: soft, non-distended Ext: no edema Psych: pleasant, normal affect Musculoskeletal:        General: No swelling.     Cervical back: Normal range of motion.     Right lower leg: No edema.     Left lower leg: Edema present.  Skin:    General: Skin is warm and dry.     Findings: Bruising present.  Neurological:     Mental Status: He is alert and oriented to person, place, and time.     Comments: HOH. Able to answer orientation questions and follow simple motor commands. Oriented to person, place, reason, month, day, year, day of week. Provided me his home address. Normal language and speech. CN exam non-focal. MMT: 4+/5 prox to distal in BUE. 4- to 4/5 prox to distal in BLE. Sensory exam normal for light touch and pain in all 4 limbs. No limb ataxia or cerebellar signs but slight impairments in [facility]. No abnormal tone appreciated.  Stable 12/13 Psychiatric:        Mood and Affect: Mood normal.        Behavior: Behavior  normal.    Assessment/Plan: 1. Functional deficits which require 3+ hours per day of interdisciplinary therapy in a comprehensive inpatient rehab setting. Physiatrist is providing close team supervision and 24 hour management of active medical problems listed below. Physiatrist and rehab team continue to assess barriers to discharge/monitor patient progress toward functional and medical goals  Care Tool:  Bathing    Body parts bathed by patient: Right arm, Left arm, Chest, Abdomen, Front perineal area, Buttocks, Right upper leg, Left upper leg, Right lower leg, Left lower leg, Face         Bathing assist Assist Level: Contact Guard/Touching assist     Upper Body Dressing/Undressing Upper body dressing   What is the patient wearing?: Pull over shirt    Upper body assist Assist Level: Supervision/Verbal cueing    Lower Body Dressing/Undressing Lower body dressing      What is the patient wearing?: Pants, Underwear/pull up     Lower body assist Assist for lower body dressing:  Contact Guard/Touching assist     Toileting Toileting    Toileting assist Assist for toileting: Contact Guard/Touching assist     Transfers Chair/bed transfer  Transfers assist     Chair/bed transfer assist level: Supervision/Verbal cueing     Locomotion Ambulation   Ambulation assist      Assist level: Contact Guard/Touching assist Assistive device: No Device Max distance: 150   Walk 10 feet activity   Assist     Assist level: Contact Guard/Touching assist Assistive device: No Device   Walk 50 feet activity   Assist    Assist level: Contact Guard/Touching assist Assistive device: No Device    Walk 150 feet activity   Assist    Assist level: Contact Guard/Touching assist Assistive device: No Device    Walk 10 feet on uneven surface  activity   Assist     Assist level: Contact Guard/Touching assist Assistive device: Other (comment) (no device.)    Wheelchair     Assist Is the patient using a wheelchair?: No             Wheelchair 50 feet with 2 turns activity    Assist            Wheelchair 150 feet activity     Assist          Blood pressure 129/70, pulse 66, temperature (!) 97.5 F (36.4 C), temperature source Oral, resp. rate 16, height 5' 9 (1.753 m), weight 82.6 kg, SpO2 97%.  Medical Problem List and Plan: 1. 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  Atrial fibrillation: continue Eliquis              -antiplatelet therapy: ASA  3. Pain Management: n/a .  4. Insomnia: valium  restarted with good effect  5. Neuropsych/cognition: This patient is capable of making decisions on his own behalf.  6. Constipation: magnesium  level reviewed and is 1.8, increase magnesium  oxide to 400mg  daily, repeat level Monday, sennakot and colace added daily, LBM 12/11- small  - 12/13 LBM today continue monitor  7. Fluids/Electrolytes/Nutrition: Monitor I/O.   8.  Acute on chronic renal failure: repeat creatinine monday  9. CAD s/p: Has hx of presyncope in the past. Continue Lipitor and ASA.              --monitor of orthostatic symptoms also.   -12/13 denies chest pain continue to follow  10.  HTN: Monitor BP TID--on Amlodipine  10 mg, Hydralazine  25 mg TID, Tenormin  75 mg, magneisum oxide increased to 400mg  daily, decrease benzapril to 5mg  BID, check magnesium  level on Monday  12/13 BP  stable, continue current regimen and monitor    06/26/2024    9:47 AM 06/26/2024    5:29 AM 06/25/2024    7:23 PM  Vitals with BMI  Systolic 129 131 863  Diastolic 70 66 66  Pulse 66 84 101  11. PAF: Rate controlled and monitor for symptoms with increase in activity. --continue tenolol daily - 12/13 heart rate stable continue to monitor  12. Vestibular dysfunction:Dizziness: Therapy to continue to work on VOR. --continue Meclizine  prn.  LOS: 5 days A FACE TO FACE EVALUATION WAS PERFORMED  Murray Collier 06/26/2024, 12:01 PM

## 2024-06-26 NOTE — Plan of Care (Signed)
°  Problem: Consults Goal: RH STROKE PATIENT EDUCATION Description: See Patient Education module for education specifics  Outcome: Progressing   Problem: RH SAFETY Goal: RH STG ADHERE TO SAFETY PRECAUTIONS W/ASSISTANCE/DEVICE Description: STG Adhere to Safety Precautions With cues Assistance/Device. Outcome: Progressing   Problem: RH KNOWLEDGE DEFICIT Goal: RH STG INCREASE KNOWLEDGE OF DYSPHAGIA/FLUID INTAKE Description: Patient and son will be able to manage dysphagia using educational resources for medications and dietary modification independently Outcome: Progressing

## 2024-06-27 NOTE — Progress Notes (Signed)
 Occupational Therapy Session Note  Patient Details  Name: Dustin Ashley MRN: [id number] Date of Birth: Sep 10, 1940  Today's Date: 06/27/2024 OT Individual Time: 0230-0330 OT Individual Time Calculation (min): 60 min    Short Term Goals: Week 1:  OT Short Term Goal 1 (Week 1): LTG =STG d/t ELOS  Skilled Therapeutic Interventions/Progress Updates:     The pt was seat in bed at the time of arrival with family present. The pt indicated that he rested well during the night with no pain to report at the time of treatment. The patient was in agreement with completing UB exercises for gains with functional Ind during the execution of BADL related task. The pt was able to come from supine in be to EOB with close S free of device. The pt was able to tolerate manual manipulation to the scarpular and surrounding structures as a precursor to UB strengthening. The pt went on to complete a resistive exercise against oppositional force in various planes 3x to improve UB strength. This was followed with the patient completing UB exercises using a 3lb dumb bell 1 set of 13 for bicep curls, shld flexion, horizontal abduction, pull up, and elbow extension with rest breaks as needed, the pt required 3 rest breaks. The pt was then instructed in ways to simplify task performance in the home using EC techniques to simplify task with safety as the focus regarding minimizing the patient's risk for falls.  At the end of the session, the pt completed  the UB cycle while seated at w/c LOF. Prior to me exiting the room, the call light and all additional needs were addressed  Therapy Documentation Precautions:  Precautions Precautions: Fall Recall of Precautions/Restrictions: Intact Precaution/Restrictions Comments: HoH Restrictions Weight Bearing Restrictions Per Provider Order: No   Therapy/Group: Individual Therapy  Elvera JONETTA Mace 06/27/2024, 3:59 PM

## 2024-06-27 NOTE — Plan of Care (Signed)
  Problem: Consults Goal: RH STROKE PATIENT EDUCATION Description: See Patient Education module for education specifics  Outcome: Progressing   Problem: RH SAFETY Goal: RH STG ADHERE TO SAFETY PRECAUTIONS W/ASSISTANCE/DEVICE Description: STG Adhere to Safety Precautions With cues Assistance/Device. Outcome: Progressing

## 2024-06-27 NOTE — Progress Notes (Signed)
 Physical Therapy Session Note  Patient Details  Name: TASHA JINDRA MRN: [id number] Date of Birth: 30-Apr-1941  Today's Date: 06/27/2024 PT Individual Time: 0902-1014, 1103-1200 PT Individual Time Calculation (min): 72 min, 57 min  Short Term Goals: Week 1:  PT Short Term Goal 1 (Week 1): STG=LTG 2/2 ELOS  Skilled Therapeutic Interventions/Progress Updates:      Treatment Session 1  Pt supine in bed upon arrival. Pt agreeable to therapy. Pt denies any pain.   Pt ambulated room <> main gym <> day room <> ortho gym with no AD and CGA progressing to distant supervision. No episode of LOB.   Pt performed the following for B LE/UE strengthening/coordination:   Quadruped 1x10 B unilateral arm raises   Quadruped 1x10 B unilateral leg raises  Quadruped 1x10 B reciprocal UE/LE raises- pt required min progressing to CGA with repetition.   Pt endorses plan to discharge to his house first for one day to see if he can do the things he needs to do and if not he will discharge to his son's house. Pt endorses the biggest concern her has regarding his house is his 14 steps to access his bedroom/bathroom.   Pt navigated stairs in stairwell 1x22 steps with reciprocal gait and R ascending/ L descending unilateral handrail to simulate home environment -- with CGA progressing to supervision -no LOB noted.   Pt performed the following for higher level dynamic balance, hip/ankle/stepping strategy/ APA and APR, and dual tasking.   Pt requesting to work on lateral stepping. Pt endorsing getting swimmy headed last time he completed this activity. Pt performed lateral stepping x ~20 feet B with no AD and supervision - pt asymptomatic.   Lateral stepping with anterior cross over step (g[place]vine) x 20 feet B, and posterior cross step (grapevine) x20 feet B with min A progressing to CGA - pt demonstrates increased difficulty and compensatory rotation with lateral stepping to R -verbal cues provided for  correction. - Pt asymptomatic   Walking with no AD while randomly catching/throwing versus kicking ball back to tech - verbal cues provided for kicking with L LE and R LE versus R LE only. Pt required CGA with intermittent light min A.   Lateral stepping B up and down ramp, backwards stepping up and down ramp with CGA/intermittent min A, verbal cues provided for increased step length - p asymptomatic.   Pt ambulated up/down the ramp, across the mulch, and up and down the curb step with unilateral UE support on handrail and supervision; pt demonstrates one episode of LOB to R however able to self correct with use of stepping strategy.   Pt seated EOB at end of session with all needs within reach and bed alarm on.   Treatment Session 2   Pt seated EOB upon arrival. Pt agreeable to therapy. Pt denies any pain.   Pt ambulated room to ortho gym with no AD and distant supervision/mod I.   Eduction provided regarding methods to reduce fall risk at home: including removing throw rugs, installing night lights, reducing clutter. Pt also endorses having RW available as needed at home.   Education provided regarding what to do in the instance of a fall with emphasis on first assessing for injury (bleeding, fracture, head trauma) or signs and symptoms of additional stroke. PT recommended in this instance not to get up by himself. Pt verbalized understanding and agreeable and endorses he would press his life alert button. Pt endorses family plan to purchase this.  Pt performed transfer to mat on floor x2 with supervision. Education provided to crawl to nearest closes stable surface (bed, couch, stable chair).   Education provided to pt and pt son regarding recognizing signs and symptoms of stroke with emphasis on BE FAST acronym, and seeking medical attention immediately.   Education provided regarding file for life.   Pt picked up cones x8 off of floor without AD and supervision.   Pt ambulated over  thresholds, in and out of elevators, on even/uneven terrain indoors and outdoors with distant supervision. Pt performed ambulatory transfer to bench with no AD and distant supervision.   Pt supine in bed with all needs within reach and bed alarm on.     Therapy Documentation Precautions:  Precautions Precautions: Fall Recall of Precautions/Restrictions: Intact Precaution/Restrictions Comments: HoH Restrictions Weight Bearing Restrictions Per Provider Order: No  Therapy/Group: Individual Therapy  [facility] Doreene Orris, [place], DPT  06/27/2024, 7:32 AM

## 2024-06-27 NOTE — Progress Notes (Addendum)
 PROGRESS NOTE   Subjective/Complaints: No acute events overnight.  No complaints this morning. LBM yesterday  ROS: Denies chest pain, shortness of breath, abdominal pain, nausea, fevers, nausea   Objective:   No results found. No results for input(s): WBC, HGB, HCT, PLT in the last 72 hours.  No results for input(s): NA, K, CL, CO2, GLUCOSE, BUN, CREATININE, CALCIUM  in the last 72 hours.   Intake/Output Summary (Last 24 hours) at 06/27/2024 1144 Last data filed at 06/27/2024 9386 Gross per 24 hour  Intake 140 ml  Output 800 ml  Net -660 ml        Physical Exam: Vital Signs Blood pressure 135/76, pulse 70, temperature 98.1 F (36.7 C), temperature source Oral, resp. rate 17, height 5' 9 (1.753 m), weight 82.6 kg, SpO2 96%. Gen: no distress, normal appearing, sitting in wheelchair appears comfortable HEENT: oral mucosa pink and moist, NCAT Cardio: Reg rate Chest: CTAB, normal effort, normal rate of breathing Abd: soft, non-distended Ext: no edema Psych: pleasant, normal affect Musculoskeletal:        General: No swelling.     Cervical back: Normal range of motion.     Right lower leg: No edema.     Left lower leg: Edema present.  Skin:    General: Skin is warm and dry.     Findings: Bruising present.  Neurological:     Mental Status: He is alert and oriented to person, place, and time.     Comments: HOH. Able to answer orientation questions and follow simple motor commands. Oriented to person, place, reason, month, day, year, day of week. Provided me his home address. Normal language and speech. CN exam non-focal. MMT: 4+/5 prox to distal in BUE. 4- to 4/5 prox to distal in BLE. Sensory exam normal for light touch and pain in all 4 limbs. No limb ataxia or cerebellar signs but slight impairments in [facility]. No abnormal tone appreciated.  Stable 12/13 Psychiatric:        Mood and Affect: Mood  normal.        Behavior: Behavior normal.    Assessment/Plan: 1. Functional deficits which require 3+ hours per day of interdisciplinary therapy in a comprehensive inpatient rehab setting. Physiatrist is providing close team supervision and 24 hour management of active medical problems listed below. Physiatrist and rehab team continue to assess barriers to discharge/monitor patient progress toward functional and medical goals  Care Tool:  Bathing    Body parts bathed by patient: Right arm, Left arm, Chest, Abdomen, Front perineal area, Buttocks, Right upper leg, Left upper leg, Right lower leg, Left lower leg, Face         Bathing assist Assist Level: Contact Guard/Touching assist     Upper Body Dressing/Undressing Upper body dressing   What is the patient wearing?: Pull over shirt    Upper body assist Assist Level: Supervision/Verbal cueing    Lower Body Dressing/Undressing Lower body dressing      What is the patient wearing?: Pants, Underwear/pull up     Lower body assist Assist for lower body dressing: Contact Guard/Touching assist     Toileting Toileting    Toileting assist Assist for toileting:  Contact Guard/Touching assist     Transfers Chair/bed transfer  Transfers assist     Chair/bed transfer assist level: Supervision/Verbal cueing     Locomotion Ambulation   Ambulation assist      Assist level: Supervision/Verbal cueing Assistive device: No Device Max distance: 150 +   Walk 10 feet activity   Assist     Assist level: Supervision/Verbal cueing Assistive device: No Device   Walk 50 feet activity   Assist    Assist level: Supervision/Verbal cueing Assistive device: No Device    Walk 150 feet activity   Assist    Assist level: Supervision/Verbal cueing Assistive device: No Device    Walk 10 feet on uneven surface  activity   Assist     Assist level: Supervision/Verbal cueing Assistive device: Other (comment)  (none)   Wheelchair     Assist Is the patient using a wheelchair?: No             Wheelchair 50 feet with 2 turns activity    Assist            Wheelchair 150 feet activity     Assist          Blood pressure 135/76, pulse 70, temperature 98.1 F (36.7 C), temperature source Oral, resp. rate 17, height 5' 9 (1.753 m), weight 82.6 kg, SpO2 96%.  Medical Problem List and Plan: 1. 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  Atrial fibrillation: continue Eliquis              -antiplatelet therapy: ASA  3. Pain Management: n/a .  4. Insomnia: valium  restarted with good effect  5. Neuropsych/cognition: This patient is capable of making decisions on his own behalf.  6. Constipation: magnesium  level reviewed and is 1.8, increase magnesium  oxide to 400mg  daily, repeat level Monday, sennakot and colace added daily, LBM 12/11- small  - 12/14 LBM yesterday continue to monitor  7. Fluids/Electrolytes/Nutrition: Monitor I/O.   8.  Acute on chronic renal failure: repeat creatinine monday  9. CAD s/p: Has hx of presyncope in the past. Continue Lipitor and ASA.              --monitor of orthostatic symptoms also.   -12/13 denies chest pain continue to follow  10.  HTN: Monitor BP TID--on Amlodipine  10 mg, Hydralazine  25 mg TID, Tenormin  75 mg, magneisum oxide increased to 400mg  daily, decrease benzapril to 5mg  BID, check magnesium  level on  Monday  12/14 occasional elevated value but overall stable, continue current regimen for now    06/27/2024   10:25 AM 06/27/2024    6:11 AM 06/26/2024    7:49 PM  Vitals with BMI  Systolic  135 148  Diastolic  76 62  Pulse 70 58 86                 11. PAF: Rate controlled and monitor  for symptoms with increase in activity. --continue tenolol daily - 12/13-14 heart rate stable continue to monitor  12. Vestibular dysfunction:Dizziness: Therapy to continue to work on VOR. --continue Meclizine  prn.  LOS: 6 days A FACE TO FACE EVALUATION WAS PERFORMED  Murray Collier 06/27/2024, 11:44 AM

## 2024-06-28 ENCOUNTER — Other Ambulatory Visit (HOSPITAL_COMMUNITY): Payer: Self-pay

## 2024-06-28 LAB — BASIC METABOLIC PANEL WITH GFR
Anion gap: 11 (ref 5–15)
BUN: 32 mg/dL — ABNORMAL HIGH (ref 8–23)
CO2: 22 mmol/L (ref 22–32)
Calcium: 8.7 mg/dL — ABNORMAL LOW (ref 8.9–10.3)
Chloride: 108 mmol/L (ref 98–111)
Creatinine, Ser: 1.74 mg/dL — ABNORMAL HIGH (ref 0.61–1.24)
GFR, Estimated: 38 mL/min — ABNORMAL LOW (ref >60)
Glucose, Bld: 103 mg/dL — ABNORMAL HIGH (ref 70–99)
Potassium: 4.3 mmol/L (ref 3.5–5.1)
Sodium: 141 mmol/L (ref 135–145)

## 2024-06-28 LAB — CBC
HCT: 41.2 % (ref 39.0–52.0)
Hemoglobin: 14 g/dL (ref 13.0–17.0)
MCH: 29.3 pg (ref 26.0–34.0)
MCHC: 34 g/dL (ref 30.0–36.0)
MCV: 86.2 fL (ref 80.0–100.0)
Platelets: 187 K/uL (ref 150–400)
RBC: 4.78 MIL/uL (ref 4.22–5.81)
RDW: 13.3 % (ref 11.5–15.5)
WBC: 9 K/uL (ref 4.0–10.5)
nRBC: 0 % (ref 0.0–0.2)

## 2024-06-28 LAB — MAGNESIUM: Magnesium: 2 mg/dL (ref 1.7–2.4)

## 2024-06-28 MED ORDER — APIXABAN 2.5 MG PO TABS
2.5000 mg | ORAL_TABLET | Freq: Two times a day (BID) | ORAL | 0 refills | Status: DC
  Filled 2024-06-28: qty 60, 30d supply, fill #0

## 2024-06-28 MED ORDER — AMLODIPINE BESYLATE 10 MG PO TABS
10.0000 mg | ORAL_TABLET | Freq: Every day | ORAL | 0 refills | Status: DC
  Filled 2024-06-28: qty 30, 30d supply, fill #0

## 2024-06-28 MED ORDER — ASPIRIN 81 MG PO TBEC
81.0000 mg | DELAYED_RELEASE_TABLET | Freq: Every day | ORAL | 0 refills | Status: AC
  Filled 2024-06-28: qty 30, 30d supply, fill #0

## 2024-06-28 MED ORDER — L-METHYLFOLATE-B6-B12 3-35-2 MG PO TABS
1.0000 | ORAL_TABLET | Freq: Every day | ORAL | 0 refills | Status: DC
  Filled 2024-06-28: qty 30, 30d supply, fill #0

## 2024-06-28 MED ORDER — ATENOLOL 50 MG PO TABS
75.0000 mg | ORAL_TABLET | Freq: Every day | ORAL | 0 refills | Status: DC
  Filled 2024-06-28: qty 45, 30d supply, fill #0

## 2024-06-28 MED ORDER — POLYETHYLENE GLYCOL 3350 17 G PO PACK: 17.0000 g | PACK | Freq: Every day | ORAL | Status: AC | PRN

## 2024-06-28 MED ORDER — BENAZEPRIL HCL 5 MG PO TABS
5.0000 mg | ORAL_TABLET | Freq: Two times a day (BID) | ORAL | 0 refills | Status: DC
  Filled 2024-06-28: qty 60, 30d supply, fill #0

## 2024-06-28 MED ORDER — MAGNESIUM OXIDE -MG SUPPLEMENT 400 (240 MG) MG PO TABS
400.0000 mg | ORAL_TABLET | Freq: Every day | ORAL | 0 refills | Status: DC
  Filled 2024-06-28: qty 30, 30d supply, fill #0

## 2024-06-28 NOTE — Progress Notes (Signed)
 Occupational Therapy Session Note  Patient Details  Name: Dustin Ashley MRN: [id number] Date of Birth: 01-08-1941  Today's Date: 06/28/2024 OT Individual Time: 8994-8886 OT Individual Time Calculation (min): 68 min    Short Term Goals: Week 1:  OT Short Term Goal 1 (Week 1): LTG =STG d/t ELOS  Skilled Therapeutic Interventions/Progress Updates:    Patient received seated at edge of bed.  Son at bedside, packing up room.  Patient bathed and dressed and eager to walk.  Walked to gym without device independently.   Completed discharge testing, and reviewed results with patient.  Patient pleased with progress. Discussed plan to return to his own home with support from family - reviewed plan also with son.  Discussed having tub bench available if needed at home.  Patient's son securing grab bars for shower and near toilet.   Discussed recommendations relating to driving.  Patient advised to wait until MD clearance.  Reviewed with son as well.   Patient hopes to return to routine exercise at the [facility].  Son can take him - and son reinforcing that patient needs to eat and drink pror to exercising to reduce light headed symptoms.    Patient made independent in room.   Patient   Therapy Documentation Precautions:  Precautions Precautions: Fall Recall of Precautions/Restrictions: Intact Precaution/Restrictions Comments: HoH Restrictions Weight Bearing Restrictions Per Provider Order: No   Pain: Pain Assessment Pain Score: 0-No pain      Therapy/Group: Individual Therapy  Sherrol Vicars M 06/28/2024, 12:39 PM

## 2024-06-28 NOTE — Plan of Care (Signed)
°  Problem: Consults Goal: RH STROKE PATIENT EDUCATION Description: See Patient Education module for education specifics  Outcome: Progressing   Problem: RH SAFETY Goal: RH STG ADHERE TO SAFETY PRECAUTIONS W/ASSISTANCE/DEVICE Description: STG Adhere to Safety Precautions With cues Assistance/Device. Outcome: Progressing   Problem: RH KNOWLEDGE DEFICIT Goal: RH STG INCREASE KNOWLEDGE OF HYPERTENSION Description: Patient and son will be able to manage HTN using educational resources for medications and dietary modification independently Outcome: Progressing Goal: RH STG INCREASE KNOWLEDGE OF DYSPHAGIA/FLUID INTAKE Description: Patient and son will be able to manage dysphagia using educational resources for medications and dietary modification independently Outcome: Progressing Goal: RH STG INCREASE KNOWLEGDE OF HYPERLIPIDEMIA Description: Patient and son will be able to manage HLD using educational resources for medications and dietary modification independently Outcome: Progressing   Problem: RH KNOWLEDGE DEFICIT Goal: RH STG INCREASE KNOWLEDGE OF STROKE PROPHYLAXIS Description: Patient and son will be able to manage secondary risks using educational resources for medications and dietary modification independently Outcome: Progressing

## 2024-06-28 NOTE — Progress Notes (Signed)
 Physical Therapy Session Note  Patient Details  Name: Dustin Ashley MRN: [id number] Date of Birth: Jul 31, 1940  Today's Date: 06/28/2024 PT Individual Time: 8484-8384 PT Individual Time Calculation (min): 60 min   Short Term Goals: Week 1:  PT Short Term Goal 1 (Week 1): STG=LTG 2/2 ELOS  Skilled Therapeutic Interventions/Progress Updates:      Pt presents in the bathroom with his son at the bedside. Discussed general DC plan with the son, home safety, fall prevention, and patient's readiness to DC. Both eager to return home, asking what time DC will be - relayed to RN for family request for earlier DC.   Pt completed functional mobility at independent level without the use of an AD throughout session. Pt with much quicker gait speed, more confidence on his feet, and improved dynamic standing balance compared to last week.   Reviewed stair training with 6 steps and 1 hand rail - pt completed these at mod I level with the use of a hand rail, reciprocal stepping for both directions.   Pt completed BERG balance reassessment with results outlined below. Pt with a 4 point improvement compared to when he was tested ~5 days ago.   Patient demonstrates increased fall risk as noted by score of   51/56 on Berg Balance Scale.  (<36= high risk for falls, close to 100%; 37-45 significant >80%; 46-51 moderate >50%; 52-55 lower >25%)  Pt completed treadmill training without body weight support system: -BUE support, level surface, 1.67mph, 6 minutes = 508 feet -LUE support only, level surface, backward walking, 0.7mph, 4 minutes = 184ft -BUE support, level 7 incline, 1.39mph, 6.5 minutes = 742 ft *seated rest breaks b/w sets.  Pt ambulated back to his room and finished session seated EOB. His son at bedside.   Therapy Documentation Precautions:  Precautions Precautions: Fall Recall of Precautions/Restrictions: Intact Precaution/Restrictions Comments: HoH Restrictions Weight Bearing  Restrictions Per Provider Order: No General:    Balance Balance Assessed: Yes Standardized Balance Assessment Standardized Balance Assessment: Berg Balance Test Berg Balance Test Sit to Stand: Able to stand without using hands and stabilize independently Standing Unsupported: Able to stand safely 2 minutes Sitting with Back Unsupported but Feet Supported on Floor or Stool: Able to sit safely and securely 2 minutes Stand to Sit: Sits safely with minimal use of hands Transfers: Able to transfer safely, minor use of hands Standing Unsupported with Eyes Closed: Able to stand 10 seconds safely Standing Ubsupported with Feet Together: Able to place feet together independently and stand 1 minute safely From Standing, Reach Forward with Outstretched Arm: Can reach confidently >25 cm (10) From Standing Position, Pick up Object from Floor: Able to pick up shoe safely and easily From Standing Position, Turn to Look Behind Over each Shoulder: Looks behind one side only/other side shows less weight shift Turn 360 Degrees: Able to turn 360 degrees safely in 4 seconds or less Standing Unsupported, Alternately Place Feet on Step/Stool: Able to stand independently and safely and complete 8 steps in 20 seconds Standing Unsupported, One Foot in Front: Able to plae foot ahead of the other independently and hold 30 seconds Standing on One Leg: Tries to lift leg/unable to hold 3 seconds but remains standing independently Total Score: 51/56    Therapy/Group: Individual Therapy  Dustin Ashley 06/28/2024, 7:55 AM

## 2024-06-28 NOTE — Progress Notes (Signed)
 Occupational Therapy Discharge Summary  Patient Details  Name: Dustin Ashley MRN: [id number] Date of Birth: 1941/04/27  Date of Discharge from OT service:June 28, 2024     Patient has met 13 of 13 long term goals due to improved activity tolerance, improved balance, and improved coordination.  Patient to discharge at overall Modified Independent level.  Patient's care partner is independent to provide the necessary physical assistance at discharge.    Reasons goals not met: NA  Recommendation:  Patient will benefit from ongoing skilled OT services in outpatient setting to continue to advance functional skills in the area of iADL.  Equipment: No equipment provided  Reasons for discharge: treatment goals met and discharge from hospital  Patient/family agrees with progress made and goals achieved: Yes  OT Discharge Precautions/Restrictions  Precautions Precautions: Fall Recall of Precautions/Restrictions: Intact Precaution/Restrictions Comments: HoH Restrictions Weight Bearing Restrictions Per Provider Order: No Pain Pain Assessment Pain Scale: 0-10 Pain Score: 0-No pain ADL ADL Equipment Provided: Reacher Eating: Modified independent Where Assessed-Eating: Edge of bed Grooming: Modified independent Where Assessed-Grooming: Standing at sink Upper Body Bathing: Modified independent Where Assessed-Upper Body Bathing: Shower Lower Body Bathing: Modified independent Where Assessed-Lower Body Bathing: Shower Upper Body Dressing: Modified independent (Device) Where Assessed-Upper Body Dressing: Edge of bed Lower Body Dressing: Modified independent Where Assessed-Lower Body Dressing: Edge of bed Toileting: Independent Where Assessed-Toileting: Teacher, Adult Education: Community Education Officer Method: Proofreader: Chiropractor Transfer: Modified independent Web Designer Method: Event Organiser: Modified  independent Film/video Editor Method: Designer, Industrial/product: Grab bars Vision Baseline Vision/History: 1 Wears glasses Wears Glasses: At all times Patient Visual Report: No change from baseline Vision Assessment?: No apparent visual deficits Perception  Perception: Within Functional Limits Praxis Praxis: WFL Cognition Cognition Overall Cognitive Status: Within Functional Limits for tasks assessed Arousal/Alertness: Awake/alert Memory: Appears intact Attention: Selective;Alternating Selective Attention: Appears intact Awareness: Appears intact Awareness Impairment: Anticipatory impairment Problem Solving: Appears intact Problem Solving Impairment: Functional complex Self Monitoring: Appears intact Self Monitoring Impairment: Functional complex Self Correcting: Appears intact Self Correcting Impairment: Functional complex Safety/Judgment: Appears intact Brief Interview for Mental Status (BIMS) Repetition of Three Words (First Attempt): 3 Temporal Orientation: Year: Correct Temporal Orientation: Month: Accurate within 5 days Temporal Orientation: Day: Correct Recall: Sock: Yes, no cue required Recall: Blue: Yes, no cue required Recall: Bed: Yes, no cue required BIMS Summary Score: 15 Sensation Sensation Light Touch: Appears Intact Hot/Cold: Appears Intact Proprioception: Appears Intact Stereognosis: Appears Intact Coordination Gross Motor Movements are Fluid and Coordinated: No Fine Motor Movements are Fluid and Coordinated: Yes Finger Nose Finger Test: [facility] 9 Hole Peg Test: RUE:  31.88  LUE  32.27 Motor  Motor Motor: Within Functional Limits Motor - Discharge Observations: mild balance deficits Mobility  Bed Mobility Bed Mobility: Sit to Supine;Supine to Sit Rolling Right: Independent Rolling Left: Independent Supine to Sit: Independent Sit to Supine: Independent Transfers Sit to Stand: Independent Stand to Sit: Independent   Trunk/Postural Assessment  Cervical Assessment Cervical Assessment: Within Functional Limits Thoracic Assessment Thoracic Assessment: Within Functional Limits Lumbar Assessment Lumbar Assessment: Within Functional Limits Postural Control Postural Control: Within Functional Limits  Balance Balance Balance Assessed: Yes Static Sitting Balance Static Sitting - Balance Support: Feet supported Static Sitting - Level of Assistance: 7: Independent Dynamic Sitting Balance Dynamic Sitting - Balance Support: Feet supported;During functional activity Dynamic Sitting - Level of Assistance: 7: Independent Static Standing Balance Static Standing - Balance Support: During functional activity;Right upper extremity  supported;Left upper extremity supported Static Standing - Level of Assistance: 6: Modified independent (Device/Increase time) Dynamic Standing Balance Dynamic Standing - Balance Support: Right upper extremity supported;Left upper extremity supported;During functional activity Dynamic Standing - Level of Assistance: 6: Modified independent (Device/Increase time) Extremity/Trunk Assessment RUE Assessment RUE Assessment: Within Functional Limits LUE Assessment LUE Assessment: Within Functional Limits   Fransico Allean HERO 06/28/2024, 11:15 AM

## 2024-06-28 NOTE — Progress Notes (Signed)
 PROGRESS NOTE   Subjective/Complaints: No new complaints this morning Now having loose stool so refused laxatives, discontinued scheduled colace, had three BM today  ROS: Denies chest pain, shortness of breath, abdominal pain, nausea, fevers, nausea, +loose stool   Objective:   No results found. Recent Labs    06/28/24 0647  WBC 9.0  HGB 14.0  HCT 41.2  PLT 187    Recent Labs    06/28/24 0647  NA 141  K 4.3  CL 108  CO2 22  GLUCOSE 103*  BUN 32*  CREATININE 1.74*  CALCIUM  8.7*     Intake/Output Summary (Last 24 hours) at 06/28/2024 1010 Last data filed at 06/28/2024 0800 Gross per 24 hour  Intake 653 ml  Output 300 ml  Net 353 ml        Physical Exam: Vital Signs Blood pressure (!) 142/83, pulse 90, temperature 98.4 F (36.9 C), temperature source Oral, resp. rate 16, height 5' 9 (1.753 m), weight 82.6 kg, SpO2 95%. Gen: no distress, normal appearing, sitting in wheelchair appears comfortable HEENT: oral mucosa pink and moist, NCAT Cardio: Reg rate Chest: CTAB, normal effort, normal rate of breathing Abd: soft, non-distended Ext: no edema Psych: pleasant, normal affect Musculoskeletal:        General: No swelling.     Cervical back: Normal range of motion.     Right lower leg: No edema.     Left lower leg: Edema present.  Skin:    General: Skin is warm and dry.     Findings: Bruising present.  Neurological:     Mental Status: He is alert and oriented to person, place, and time.     Comments: HOH. Able to answer orientation questions and follow simple motor commands. Oriented to person, place, reason, month, day, year, day of week. Provided me his home address. Normal language and speech. CN exam non-focal. MMT: 4+/5 prox to distal in BUE. 4- to 4/5 prox to distal in BLE. Sensory exam normal for light touch and pain in all 4 limbs. No limb ataxia or cerebellar signs but slight impairments in  [facility]. No abnormal tone appreciated.  Stable 12/15 Psychiatric:        Mood and Affect: Mood normal.        Behavior: Behavior normal.    Assessment/Plan: 1. Functional deficits which require 3+ hours per day of interdisciplinary therapy in a comprehensive inpatient rehab setting. Physiatrist is providing close team supervision and 24 hour management of active medical problems listed below. Physiatrist and rehab team continue to assess barriers to discharge/monitor patient progress toward functional and medical goals  Care Tool:  Bathing    Body parts bathed by patient: Right arm, Left arm, Chest, Abdomen, Front perineal area, Buttocks, Right upper leg, Left upper leg, Right lower leg, Left lower leg, Face         Bathing assist Assist Level: Contact Guard/Touching assist     Upper Body Dressing/Undressing Upper body dressing   What is the patient wearing?: Pull over shirt    Upper body assist Assist Level: Supervision/Verbal cueing    Lower Body Dressing/Undressing Lower body dressing      What is  the patient wearing?: Pants, Underwear/pull up     Lower body assist Assist for lower body dressing: Contact Guard/Touching assist     Toileting Toileting    Toileting assist Assist for toileting: Contact Guard/Touching assist     Transfers Chair/bed transfer  Transfers assist     Chair/bed transfer assist level: Independent     Locomotion Ambulation   Ambulation assist      Assist level: Supervision/Verbal cueing Assistive device: No Device Max distance: 150 +   Walk 10 feet activity   Assist     Assist level: Supervision/Verbal cueing Assistive device: No Device   Walk 50 feet activity   Assist    Assist level: Supervision/Verbal cueing Assistive device: No Device    Walk 150 feet activity   Assist    Assist level: Supervision/Verbal cueing Assistive device: No Device    Walk 10 feet on uneven surface  activity   Assist      Assist level: Supervision/Verbal cueing Assistive device: Other (comment) (none)   Wheelchair     Assist Is the patient using a wheelchair?: No             Wheelchair 50 feet with 2 turns activity    Assist            Wheelchair 150 feet activity     Assist          Blood pressure (!) 142/83, pulse 90, temperature 98.4 F (36.9 C), temperature source Oral, resp. rate 16, height 5' 9 (1.753 m), weight 82.6 kg, SpO2 95%.  Medical Problem List and Plan: 1. 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  Atrial fibrillation: continue Eliquis              -antiplatelet therapy: ASA  3. Pain Management: n/a .  4. Insomnia: valium  restarted with good effect  5. Neuropsych/cognition: This patient is capable of making decisions on his own behalf.  6. Constipation: magnesium  level reviewed and is 1.8, increase magnesium  oxide to 400mg  daily, repeat level Monday, sennakot d/c colace  7. Fluids/Electrolytes/Nutrition: Monitor I/O.   8.  Acute on chronic renal failure: repeat creatinine monday  9. CAD s/p: Has hx of presyncope in the past. continue Lipitor and ASA.   10.  HTN: Monitor BP TID--continue Amlodipine  10 mg, Hydralazine  25 mg TID, Tenormin  75 mg, magneisum oxide increased to 400mg  daily, decrease benzapril to 5mg  BID, check magnesium  level on Monday     06/28/2024    3:31 AM 06/27/2024    7:08 PM 06/27/2024    1:19 PM  Vitals with  BMI  Systolic 142 137 884  Diastolic 83 67 66  Pulse 90 92 81                 11. PAF: Rate controlled and monitor for symptoms with increase in activity. -continue tenolol daily - 12/13-14 heart rate stable continue  to monitor  12. Vestibular dysfunction:Dizziness: Therapy to continue to work on VOR. --continue Meclizine  prn.  LOS: 7 days A FACE TO FACE EVALUATION WAS PERFORMED  Beuna Bolding P Chenel Wernli 06/28/2024, 10:10 AM

## 2024-06-28 NOTE — Progress Notes (Signed)
 Occupational Therapy Session Note  Patient Details  Name: Dustin Ashley MRN: [id number] Date of Birth: 17-Nov-1940  Today's Date: 06/28/2024 OT Individual Time: 0805-0900 OT Individual Time Calculation (min): 55 min    Short Term Goals: Week 1:  OT Short Term Goal 1 (Week 1): LTG =STG d/t ELOS  Skilled Therapeutic Interventions/Progress Updates:  Pt greeted sitting EOB for skilled OT session with focus on dynamic standing balance and general conditioning.   Pain: Pt with no reports of pain. OT offering intermediate rest breaks and positioning suggestions throughout session to address pain/fatigue and maximize participation/safety in session.   Functional Transfers: Ambulatory toilet transfer with Mod I.   Self Care Tasks: Pt completes the following self care tasks with levels of assistance noted below, 3/3 toileting tasks at Mod I level.   Therapeutic Activities/Exercise: Pt instructed in series of activities targeting dynamic standing balance, righting reactions, and postural controls for carryover in safety with ADLs and functional mobility, details below: 2 x 1 min reps of forward step ups onto airex pad 1 x 1 min reps of lateral step ups onto airex pad (does endorse dizziness BP=146/77) 2 x 1 min reps of backward step ups onto airex pad 1 x 2 min of ball toss onto ball re-bounder while standing on airex foam 2 x 15 reps of chest press into overhead press while standing on airex foam  Pt requires CGA-Min A during activities, increased difficulty with RLE clearance with last variation of step-up activity. Ankle strategies elicited when reaching outside BOS on airex, remained at CGA level.   Pt remained sitting EOB  with 4Ps assessed and immediate needs met. Pt continues to be appropriate for skilled OT intervention to promote further functional independence in ADLs/IADLs.   Therapy Documentation Precautions:  Precautions Precautions: Fall Recall of Precautions/Restrictions:  Intact Precaution/Restrictions Comments: HoH Restrictions Weight Bearing Restrictions Per Provider Order: No   Therapy/Group: Individual Therapy  Nereida Habermann, OTR/L, MSOT  06/28/2024, 6:21 AM

## 2024-06-28 NOTE — Progress Notes (Signed)
 Physical Therapy Discharge Summary  Patient Details  Name: Dustin Ashley MRN: [id number] Date of Birth: 1941-04-18  Date of Discharge from PT service:June 28, 2024   Patient has met 11 of 11 long term goals due to improved activity tolerance, improved balance, and increased strength.  Patient to discharge at an ambulatory level Modified Independent.   Patient's care partner is independent to provide the necessary physical and cognitive assistance at discharge.  Reasons goals not met: n/a  Recommendation:  Patient will benefit from ongoing skilled PT services in outpatient setting to continue to advance safe functional mobility, address ongoing impairments in dynamic standing balance and generalized weakness, and minimize fall risk.  Equipment: No equipment provided  Reasons for discharge: treatment goals met and discharge from hospital  Patient/family agrees with progress made and goals achieved: Yes  PT Discharge Precautions/Restrictions Precautions Precautions: Fall Recall of Precautions/Restrictions: Intact Precaution/Restrictions Comments: HoH Restrictions Weight Bearing Restrictions Per Provider Order: No Pain Interference Pain Interference Pain Effect on Sleep: 0. Does not apply - I have not had any pain or hurting in the past 5 days Pain Interference with Therapy Activities: 0. Does not apply - I have not received rehabilitationtherapy in the past 5 days Pain Interference with Day-to-Day Activities: 1. Rarely or not at all Vision/Perception  Vision - History Ability to See in Adequate Light: 1 Impaired Perception Perception: Within Functional Limits Praxis Praxis: WFL  Cognition Overall Cognitive Status: Within Functional Limits for tasks assessed Arousal/Alertness: Awake/alert Attention: Selective;Alternating Selective Attention: Appears intact Memory: Appears intact Awareness: Appears intact Awareness Impairment: Anticipatory impairment Problem  Solving: Appears intact Problem Solving Impairment: Functional complex Self Monitoring: Appears intact Self Monitoring Impairment: Functional complex Self Correcting: Appears intact Self Correcting Impairment: Functional complex Safety/Judgment: Appears intact Sensation Sensation Light Touch: Appears Intact Hot/Cold: Appears Intact Proprioception: Appears Intact Stereognosis: Appears Intact Coordination Gross Motor Movements are Fluid and Coordinated: No Fine Motor Movements are Fluid and Coordinated: Yes Finger Nose Finger Test: [facility] 9 Hole Peg Test: RUE:  31.88  LUE  32.27 Motor  Motor Motor: Within Functional Limits Motor - Discharge Observations: mild balance deficits  Mobility Bed Mobility Bed Mobility: Sit to Supine;Supine to Sit Rolling Right: Independent Rolling Left: Independent Supine to Sit: Independent Sit to Supine: Independent Transfers Transfers: Sit to Stand;Stand to Sit;Transfer;Stand Pivot Transfers Sit to Stand: Independent Stand to Sit: Independent Stand Pivot Transfers: Independent Transfer (Assistive device): None Locomotion  Gait Ambulation: Yes Gait Assistance: Independent with assistive device Gait Distance (Feet): 150 Feet Assistive device: Rolling walker Gait Gait: Yes Gait Pattern: Within Functional Limits Stairs / Additional Locomotion Stairs: Yes Stairs Assistance: Independent with assistive device Stair Management Technique: Two rails;Step to pattern;Forwards Number of Stairs: 12 Height of Stairs: 6 Ramp: Supervision/Verbal cueing Pick up small object from the floor assist level: Independent with assistive device Wheelchair Mobility Wheelchair Mobility: No  Trunk/Postural Assessment  Cervical Assessment Cervical Assessment: Within Functional Limits Thoracic Assessment Thoracic Assessment: Within Functional Limits Lumbar Assessment Lumbar Assessment: Within Functional Limits Postural Control Postural Control: Within Functional  Limits  Balance Balance Balance Assessed: Yes Standardized Balance Assessment Standardized Balance Assessment: Berg Balance Test Berg Balance Test Sit to Stand: Able to stand without using hands and stabilize independently Standing Unsupported: Able to stand safely 2 minutes Sitting with Back Unsupported but Feet Supported on Floor or Stool: Able to sit safely and securely 2 minutes Stand to Sit: Sits safely with minimal use of hands Transfers: Able to transfer safely, minor use of hands Standing  Unsupported with Eyes Closed: Able to stand 10 seconds safely Standing Ubsupported with Feet Together: Able to place feet together independently and stand 1 minute safely From Standing, Reach Forward with Outstretched Arm: Can reach confidently >25 cm (10) From Standing Position, Pick up Object from Floor: Able to pick up shoe safely and easily From Standing Position, Turn to Look Behind Over each Shoulder: Looks behind one side only/other side shows less weight shift Turn 360 Degrees: Able to turn 360 degrees safely in 4 seconds or less Standing Unsupported, Alternately Place Feet on Step/Stool: Able to stand independently and safely and complete 8 steps in 20 seconds Standing Unsupported, One Foot in Front: Able to plae foot ahead of the other independently and hold 30 seconds Standing on One Leg: Tries to lift leg/unable to hold 3 seconds but remains standing independently Total Score: 51 Extremity Assessment      RLE Assessment RLE Assessment: Within Functional Limits LLE Assessment LLE Assessment: Within Functional Limits   Arnoldo Hildreth P Sigrid Schwebach 06/28/2024, 1:01 PM

## 2024-06-29 ENCOUNTER — Other Ambulatory Visit (HOSPITAL_COMMUNITY): Payer: Self-pay

## 2024-06-29 DIAGNOSIS — N183 Chronic kidney disease, stage 3 unspecified: Secondary | ICD-10-CM

## 2024-06-29 DIAGNOSIS — N179 Acute kidney failure, unspecified: Secondary | ICD-10-CM | POA: Insufficient documentation

## 2024-06-29 HISTORY — DX: Chronic kidney disease, stage 3 unspecified: N18.30

## 2024-06-29 LAB — BASIC METABOLIC PANEL WITH GFR
Anion gap: 13 (ref 5–15)
BUN: 33 mg/dL — ABNORMAL HIGH (ref 8–23)
CO2: 23 mmol/L (ref 22–32)
Calcium: 8.7 mg/dL — ABNORMAL LOW (ref 8.9–10.3)
Chloride: 106 mmol/L (ref 98–111)
Creatinine, Ser: 1.65 mg/dL — ABNORMAL HIGH (ref 0.61–1.24)
GFR, Estimated: 41 mL/min — ABNORMAL LOW (ref >60)
Glucose, Bld: 107 mg/dL — ABNORMAL HIGH (ref 70–99)
Potassium: 4.2 mmol/L (ref 3.5–5.1)
Sodium: 142 mmol/L (ref 135–145)

## 2024-06-29 NOTE — Progress Notes (Signed)
 Inpatient Rehabilitation Discharge Medication Review by a Pharmacist  A complete drug regimen review was completed for this patient to identify any potential clinically significant medication issues.  High Risk Drug Classes Is patient taking? Indication by Medication  Antipsychotic No   Anticoagulant Yes Apixaban - AF  Antibiotic No   Opioid Yes Aspirin - CVA ppx  Antiplatelet No   Hypoglycemics/insulin No   Vasoactive Medication Yes Norvasc - HTN Atenolol - rate control Lotensin - HTN  Chemotherapy No   Other Yes Lipitor- HLD Valium - anxiety Allegra- allergies     Type of Medication Issue Identified Description of Issue Recommendation(s)  Drug Interaction(s) (clinically significant)     Duplicate Therapy     Allergy     No Medication Administration End Date     Incorrect Dose     Additional Drug Therapy Needed     Significant med changes from prior encounter (inform family/care partners about these prior to discharge).    Other       Clinically significant medication issues were identified that warrant physician communication and completion of prescribed/recommended actions by midnight of the next day:  No   Time spent performing this drug regimen review (minutes):  30    Darnell Jeschke BS, PharmD, BCPS Clinical Pharmacist 06/29/2024 8:59 AM  Contact: [phone] after 3 PM

## 2024-06-29 NOTE — Discharge Summary (Incomplete)
 Physician Discharge Summary  Patient ID: Dustin Ashley MRN: [id number] DOB/AGE: 1940-11-26 83 y.o.  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  Brief HPI:   Dustin Ashley is a 83 y.o. male ***   Hospital Course: Dustin Ashley was admitted to rehab 06/21/2024 for inpatient therapies to consist of PT and OT at least three hours five days a week. Past admission physiatrist, therapy team and rehab RN have worked together to provide customized collaborative inpatient rehab.   Blood pressures were monitored on TID basis and      He has made great gains during his rehab stay and is independent at discharge. He has declined recommended follow up therapy and has been given HEP.   Rehab course: During patient's stay in rehab weekly team conferences were held to monitor patient's progress, set goals and discuss barriers to discharge. At admission, patient required min assist with basic ADL tasks and with mobility.  He  has had improvement in activity tolerance, balance, postural control as well as ability to compensate for deficits. He is able to complete ADL tasks at modified independent level. He is independent for transfers and is able to  ambulate 150' with use of RW. He is able to climb 12 stairs with 2 rails and use of walker. Family education has been completed.     Discharge disposition: 01-Home or Self Care  Diet:  Special Instructions: Repeat BMET in 1-2 week to monitor renal status No driving till cleared by MD.   Discharge Instructions     Ambulatory referral to Neurology   Complete by: As directed    An appointment is requested in approximately: 6 weeks   Ambulatory referral to Physical Medicine Rehab   Complete by: As directed       Allergies as of 06/29/2024       Reactions   Hydrocodone -acetaminophen  Other (See Comments)   REACTION: bladder obstruction   Tramadol Hcl Other (See Comments)   REACTION: bladder obstruction        Medication List     STOP taking these medications    benzonatate  100 MG capsule Commonly known as: Tessalon  Perles   hydrALAZINE  25 MG tablet Commonly known as: APRESOLINE    meclizine  25 MG tablet Commonly known as: ANTIVERT    oxyCODONE  5 MG immediate release tablet Commonly known as: Oxy IR/ROXICODONE    senna-docusate 8.6-50 MG tablet Commonly known as: Senokot-S       TAKE these medications    acetaminophen  325 MG tablet Commonly known as: TYLENOL  Take 2 tablets (650 mg total) by mouth every 6 (six) hours as needed  for mild pain (pain score 1-3) or headache.   amLODipine  10 MG tablet Commonly known as: NORVASC  Take 1 tablet (10 mg total) by mouth daily.   aspirin  EC 81 MG tablet Take 1 tablet (81 mg total) by mouth daily. Swallow whole.   atenolol  50 MG tablet Commonly known as: TENORMIN  Take 1.5 tablets (75 mg total) by mouth daily.   atorvastatin  20 MG tablet Commonly known as: LIPITOR TAKE 1 TABLET BY MOUTH EVERY DAY   benazepril  5 MG tablet Commonly known as: LOTENSIN  Take 1 tablet (5 mg total) by mouth 2 (two) times daily. What changed:  medication strength how much to take   diazepam  10 MG tablet Commonly known as:  VALIUM  Take 1 tablet (10 mg total) by mouth every 12 (twelve) hours as needed for anxiety or sleep.   Elfolate Plus 3-35-2 MG Tabs Take 1 tablet by mouth daily.   Eliquis  2.5 MG Tabs tablet Generic drug: apixaban  Take 1 tablet (2.5 mg total) by mouth 2 (two) times daily.   fexofenadine 180 MG tablet Commonly known as: ALLEGRA Take 180 mg by mouth daily. Notes to patient: Can resume at home if needed   magnesium  oxide 400 (240 Mg) MG tablet Commonly known as: MAG-OX Take 1 tablet (400 mg total) by mouth daily.   polyethylene glycol 17 g packet Commonly known as: MIRALAX  / GLYCOLAX  Take 17 g by mouth daily as needed.        Follow-up Information     Amon Aloysius BRAVO, MD Follow up.   Specialty: Internal Medicine Why: Call in 1-2 days for post hospital follow up Contact information: [address] [place] [address] [phone]         Lorilee Sven SQUIBB, MD Follow up.   Specialty: Physical Medicine and Rehabilitation Why: office will call you with follow up appointment Contact information: [address] [place] [address] [phone]         [facility] Follow up.   Why: office will call you with follow up appointment Contact information: [address]     [address] [place]  [address] [phone]                Signed: Sharlet GORMAN Ashley 06/29/2024, 5:18 PM

## 2024-06-29 NOTE — Progress Notes (Signed)
 Inpatient Rehabilitation Care Coordinator Discharge Note   Patient Details  Name: Dustin Ashley MRN: [id number] Date of Birth: 13-Mar-1941   Discharge location: Home with Schonn  Length of Stay: 7 days  Discharge activity level: Independent with assistive device  Home/community participation: Active in the community  Patient response un:Yzjouy Literacy - How often do you need to have someone help you when you read instructions, pamphlets, or other written material from your doctor or pharmacy?: Sometimes  Patient response un:Dnrpjo Isolation - How often do you feel lonely or isolated from those around you?: Never  Services provided included: MD, RD, PT, OT, SLP, RN, CM, TR, Pharmacy, SW  Financial Services:  Field Seismologist Utilized: Private Insurance [employer] MEDICARE / [facility] MEDICARE  Choices offered to/list presented to: Patient/Family  Follow-up services arranged:  Other (Comment) (Patient/Family declined follow-up services)           Patient response to transportation need: Is the patient able to respond to transportation needs?: Yes In the past 12 months, has lack of transportation kept you from medical appointments or from getting medications?: No In the past 12 months, has lack of transportation kept you from meetings, work, or from getting things needed for daily living?: No   Patient/Family verbalized understanding of follow-up arrangements:  Yes  Individual responsible for coordination of the follow-up plan: Patient/Schonn  Confirmed correct DME delivered: [employer]  Loreli 06/29/2024    Comments (or additional information): Patient discharging home with Schonn and support from family. Family will provide transportation home. Declined follow-up therapies.   Summary of Stay    Date/Time Discharge Planning CSW  06/23/24 1759 Will discharge home with son who will provide 24/7 support as needed. Has most DME if not all in the home. OP therapy  recommended. Patient declining. DS  06/23/24 9161 Will discharge home alone or with son who will provide 24/7 support as needed. Has most DME if not all in the home. Awaiting therapy follow-up recommendations. DS       [employer]  Loreli

## 2024-07-01 ENCOUNTER — Other Ambulatory Visit: Payer: Self-pay | Admitting: Internal Medicine

## 2024-07-13 ENCOUNTER — Encounter: Payer: Self-pay | Admitting: Internal Medicine

## 2024-07-13 ENCOUNTER — Ambulatory Visit (INDEPENDENT_AMBULATORY_CARE_PROVIDER_SITE_OTHER): Admitting: Internal Medicine

## 2024-07-13 VITALS — BP 134/66 | HR 66 | Temp 98.0°F | Resp 16 | Ht 69.0 in | Wt 203.2 lb

## 2024-07-13 DIAGNOSIS — I6930 Unspecified sequelae of cerebral infarction: Secondary | ICD-10-CM

## 2024-07-13 DIAGNOSIS — I480 Paroxysmal atrial fibrillation: Secondary | ICD-10-CM

## 2024-07-13 LAB — COMPREHENSIVE METABOLIC PANEL WITH GFR
ALT: 19 U/L (ref 3–53)
AST: 19 U/L (ref 5–37)
Albumin: 4.1 g/dL (ref 3.5–5.2)
Alkaline Phosphatase: 97 U/L (ref 39–117)
BUN: 34 mg/dL — ABNORMAL HIGH (ref 6–23)
CO2: 27 meq/L (ref 19–32)
Calcium: 9.3 mg/dL (ref 8.4–10.5)
Chloride: 105 meq/L (ref 96–112)
Creatinine, Ser: 1.44 mg/dL (ref 0.40–1.50)
GFR: 44.87 mL/min — ABNORMAL LOW
Glucose, Bld: 99 mg/dL (ref 70–99)
Potassium: 4.2 meq/L (ref 3.5–5.1)
Sodium: 140 meq/L (ref 135–145)
Total Bilirubin: 0.7 mg/dL (ref 0.2–1.2)
Total Protein: 6.7 g/dL (ref 6.0–8.3)

## 2024-07-13 LAB — CBC WITH DIFFERENTIAL/PLATELET
Basophils Absolute: 0.1 K/uL (ref 0.0–0.1)
Basophils Relative: 0.8 % (ref 0.0–3.0)
Eosinophils Absolute: 0.3 K/uL (ref 0.0–0.7)
Eosinophils Relative: 3.5 % (ref 0.0–5.0)
HCT: 41.3 % (ref 39.0–52.0)
Hemoglobin: 13.9 g/dL (ref 13.0–17.0)
Lymphocytes Relative: 25.4 % (ref 12.0–46.0)
Lymphs Abs: 2.4 K/uL (ref 0.7–4.0)
MCHC: 33.7 g/dL (ref 30.0–36.0)
MCV: 86.5 fl (ref 78.0–100.0)
Monocytes Absolute: 1.1 K/uL — ABNORMAL HIGH (ref 0.1–1.0)
Monocytes Relative: 11 % (ref 3.0–12.0)
Neutro Abs: 5.7 K/uL (ref 1.4–7.7)
Neutrophils Relative %: 59.3 % (ref 43.0–77.0)
Platelets: 157 K/uL (ref 150.0–400.0)
RBC: 4.77 Mil/uL (ref 4.22–5.81)
RDW: 13.8 % (ref 11.5–15.5)
WBC: 9.6 K/uL (ref 4.0–10.5)

## 2024-07-13 MED ORDER — APIXABAN 2.5 MG PO TABS: 2.5000 mg | ORAL_TABLET | Freq: Two times a day (BID) | ORAL | 2 refills | Status: AC

## 2024-07-13 NOTE — Patient Instructions (Addendum)
 GO TO THE LAB :  Get the blood work    Then, go to the front desk for the checkout Please make an appointment for a checkup with me in 3 months      Check the  blood pressure regularly Blood pressure goal:  between 110/65 and  135/85. If it is consistently higher or lower, let me know     CEREBRAL INFARCTION (STROKE): You had a recent stroke that affected your balance and caused vertigo but did not result in paralysis or strength loss.  -You are referred to the neurologist for follow-up. -Avoid heavy lifting and activities that increase your risk of falling due to your anticoagulation therapy. -Keep your cell phone accessible for safety.  PAROXYSMAL ATRIAL FIBRILLATION: You have a history of irregular heartbeats managed with Eliquis . -Continue taking Eliquis  2.5 mg orally twice daily. -You have a prescription for a 90-day supply of Eliquis  with two refills. -Ensure you follow up with cardiology on February 12th. - Continue aspirin  81 mg over-the-counter once a day for 90 days, then you can stop  HYPERTENSION: Your blood pressure is well-controlled with your current medications. -Continue taking Amlodipine , Atenolol , and Benazepril  as prescribed.  GENERAL HEALTH MAINTENANCE: We discussed your overall health and exercise routine. -[facility], but avoid heavy lifting and ensure you maintain stability.

## 2024-07-13 NOTE — Progress Notes (Signed)
 "  Subjective:    Patient ID: Dustin Ashley, male    DOB: 09-12-1940, 83 y.o.   MRN: [id number]  DOS:  07/13/2024 Follow-up, here with his son Discussed the use of AI scribe software for clinical note transcription with the patient, who gave verbal consent to proceed.  History of Present Illness Dustin Ashley is an 83 year old male with a history of stroke who presents for follow-up      Had a stroke recently, chart reviewed and summarized, see below. - Required rehabilitation facility stay until June 29, 2024 - Unable to walk on admission to rehabilitation, but has improved and is ambulatory   - Nearly returned to baseline functional status at home - No headaches, falls, or new neurological symptoms  Blood pressure management - Home blood pressure remains stable on current antihypertensive regimen - Takes three blood pressure medications twice daily  Anticoagulation therapy - Takes Eliquis  for stroke prevention - Requests 90-day supply to reduce copay cost - Takes over-the-counter baby aspirin   Gastrointestinal and constitutional symptoms - No nausea, vomiting, or blood in stools - Good appetite - Weight gain since stroke  Functional status and social support - Lives independently at home - Family checks on him daily    Review of Systems See above   Past Medical History:  Diagnosis Date   Allergic rhinitis    Allergy    seasonal   Arthritis    Bell palsy    CAD (coronary artery disease)    Cataract    Chronic renal insufficiency, stage II (mild)    Cr ~ 1.4, u/s 4-12 normal kidneys   Gallstone pancreatitis 2015   GERD (gastroesophageal reflux disease)    HOH (hard of hearing)    has a hearing aid L, sees audiology rountinely   Hyperglycemia    A1C 5.8 04-2010   Hyperlipemia    Hypertension    Pain, joint, multiple sites    uses valium , occ uses for insomnia. uses for shoulder  pain   Paroxysmal atrial fibrillation (HCC)    Personal history of  colonic polyps    Pulmonary emboli (HCC)    02-2014   RAS (renal artery stenosis) 05-2012    Renal insufficiency    SCC (squamous cell carcinoma)     Past Surgical History:  Procedure Laterality Date   CARDIAC CATHETERIZATION     CATARACT EXTRACTION     CHOLECYSTECTOMY N/A 02/15/2014   Procedure: LAPAROSCOPIC CHOLECYSTECTOMY with IOC;  Surgeon: Camellia CHRISTELLA Blush, MD;  Location: [facility] OR;  Service: General;  Laterality: N/A;   COLONOSCOPY     CORONARY ARTERY BYPASS GRAFT  1995   ESOPHAGOGASTRODUODENOSCOPY N/A 03/02/2014   Procedure: ESOPHAGOGASTRODUODENOSCOPY (EGD);  Surgeon: Gwendlyn ONEIDA Buddy, MD;  [facility] ENDOSCOPY;  Service: Endoscopy;  Laterality: N/A;  sarah/leone    Current Outpatient Medications  Medication Instructions   acetaminophen  (TYLENOL ) 650 mg, Oral, Every 6 hours PRN   amLODipine  (NORVASC ) 10 mg, Oral, Daily   apixaban  (ELIQUIS ) 2.5 mg, Oral, 2 times daily   aspirin  EC 81 mg, Oral, Daily, Swallow whole.   atenolol  (TENORMIN ) 75 mg, Oral, Daily   atorvastatin  (LIPITOR) 20 mg, Oral, Daily   benazepril  (LOTENSIN ) 5 mg, Oral, 2 times daily   diazepam  (VALIUM ) 10 mg, Oral, Every 12 hours PRN   fexofenadine (ALLEGRA) 180 mg, Daily   l-methylfolate-B6-B12 (METANX) 3-35-2 MG TABS tablet 1 tablet, Oral, Daily   magnesium  oxide (MAG-OX) 400 mg, Oral, Daily   polyethylene glycol (MIRALAX  /  GLYCOLAX ) 17 g, Oral, Daily PRN       Objective:   Physical Exam BP 134/66   Pulse 66   Temp 98 F (36.7 C) (Oral)   Resp 16   Ht 5' 9 (1.753 m)   Wt 203 lb 4 oz (92.2 kg)   SpO2 97%   BMI 30.01 kg/m  General:   Well developed, NAD, BMI noted. HEENT:  Normocephalic . Face symmetric, atraumatic Lungs:  CTA B Normal respiratory effort, no intercostal retractions, no accessory muscle use. Heart: Irregularly irregular Lower extremities: no pretibial edema bilaterally  Skin: Not pale. Not jaundice Neurologic:  alert & oriented X3.  Speech normal, gait appropriate for age and  unassisted Psych--  Cognition and judgment appear intact.  Cooperative with normal attention span and concentration.  Behavior appropriate. No anxious or depressed appearing.      Assessment     Assessment Prediabetes HTN Hyperlipidemia GERD, h/o Candida esophagitis 02-2014, nexium  prn Insomnia, on diazepam  MSK  neck pain, back pain, diazepam  prn CKD  Renal us  wnl 2012 CV: --CAD, CABG 1995 --Paroxysmal atrial fibrillation; (-) 3 week monitor ~07-2014  , not anticoag   --Pulmonary emboli , DVT, provoked 02-2014 --RAS dx 2013 --carotid disease:< 40% B 04-2022, med mngmt  ED  DERM: sees derm regulalrly HOH-- has aids H/o Bell palsy  H/ o gallbladder pancreatitis H/o Low testosterone  11-2014  Assessment & Plan Chart reviewed: Admitted to the hospital 06/15/2024 and discharged 6 days later. Dx acute ischemic infarct right cerebellum.  CT showed mild edema with petechial hemorrhage. Stroke workup completed, released home. Was on aspirin  PTA, discharged home on ASA 81 mg and Eliquis  2.5 mg twice daily for 90 days then Eliquis  alone. Was discharged to rehab and is now back home, living alone, family very supportive check on him frequently. Cerebral infarction (stroke) sequela. Recent stroke affecting balance and vertigo without paralysis or strength loss.  After in-hospital rehabilitation he is feeling great and essentially back to his baseline. - Referred to neurologist for follow-up. - Advised to avoid  activities that could increasing fall risk due to anticoagulation. - Encouraged to keep cell phone accessible for safety. -He is on aspirin  and Eliquis  for 90 days, then Eliquis  alone. - Check CMP, CBC and ionized calcium  which has been low. Paroxysmal atrial fibrillation Managed with Eliquis  2.5 mg orally twice daily. - Printed prescription for Eliquis  with 90-day supply and two refills. - Ensure cardiology follow-up on February 12th. HTN Blood pressure well-controlled with  current medications. - Continue Amlodipine , Atenolol , and Benazepril . RTC 3 months     "

## 2024-07-13 NOTE — Assessment & Plan Note (Signed)
 Chart reviewed: Admitted to the hospital 06/15/2024 and discharged 6 days later. Dx acute ischemic infarct right cerebellum.  CT showed mild edema with petechial hemorrhage. Stroke workup completed, released home. Was on aspirin  PTA, discharged home on ASA 81 mg and Eliquis  2.5 mg twice daily for 90 days then Eliquis  alone. Was discharged to rehab and is now back home, living alone, family very supportive check on him frequently. Cerebral infarction (stroke) sequela. Recent stroke affecting balance and vertigo without paralysis or strength loss.  After in-hospital rehabilitation he is feeling great and essentially back to his baseline. - Referred to neurologist for follow-up. - Advised to avoid  activities that could increasing fall risk due to anticoagulation. - Encouraged to keep cell phone accessible for safety. -He is on aspirin  and Eliquis  for 90 days, then Eliquis  alone. - Check CMP, CBC and ionized calcium  which has been low. Paroxysmal atrial fibrillation Managed with Eliquis  2.5 mg orally twice daily. - Printed prescription for Eliquis  with 90-day supply and two refills. - Ensure cardiology follow-up on February 12th. HTN Blood pressure well-controlled with current medications. - Continue Amlodipine , Atenolol , and Benazepril . RTC 3 months

## 2024-07-14 LAB — CALCIUM, IONIZED: Calcium, Ion: 5.3 mg/dL (ref 4.7–5.5)

## 2024-07-18 ENCOUNTER — Ambulatory Visit: Payer: Self-pay | Admitting: Internal Medicine

## 2024-07-19 ENCOUNTER — Encounter: Attending: Registered Nurse | Admitting: Registered Nurse

## 2024-07-19 ENCOUNTER — Other Ambulatory Visit: Payer: Self-pay | Admitting: Internal Medicine

## 2024-07-19 NOTE — Progress Notes (Deleted)
 "  Subjective:    Patient ID: Dustin Ashley, male    DOB: June 04, 1941, 84 y.o.   MRN: [id number]  HPI   Pain Inventory Average Pain {NUMBERS; 0-10:5044} Pain Right Now {NUMBERS; 0-10:5044} My pain is {PAIN DESCRIPTION:21022940}  LOCATION OF PAIN  ***  BOWEL Number of stools per week: *** Oral laxative use {YES/NO:21197} Type of laxative *** Enema or suppository use {YES/NO:21197} History of colostomy {YES/NO:21197} Incontinent {YES/NO:21197}  BLADDER {bladder options:24190} In and out cath, frequency *** Able to self cath {YES/NO:21197} Bladder incontinence {YES/NO:21197} Frequent urination {YES/NO:21197} Leakage with coughing {YES/NO:21197} Difficulty starting stream {YES/NO:21197} Incomplete bladder emptying {YES/NO:21197}   Mobility {MOBILITY JIO:78977055}  Function {FUNCTION:21022946}  Neuro/Psych {NEURO/PSYCH:21022948}  Prior Studies {CPRM PRIOR STUDIES:21022953}  Physicians involved in your care {CPRM PHYSICIANS INVOLVED IN YOUR CARE:21022954}   Family History  Problem Relation Age of Onset   Hypertension Father    Heart attack Brother        2 brothers, CABG   Diabetes Neg Hx    Colon cancer Neg Hx    Prostate cancer Neg Hx        colon, prostate   Rectal cancer Neg Hx    Stomach cancer Neg Hx    Social History   Socioeconomic History   Marital status: Widowed    Spouse name: Not on file   Number of children: 1   Years of education: Not on file   Highest education level: Not on file  Occupational History   Occupation: retired, delivery truck driver  Tobacco Use   Smoking status: Former    Current packs/day: 0.00    Types: Cigarettes    Quit date: 03/13/1976    Years since quitting: 48.3   Smokeless tobacco: Never  Vaping Use   Vaping status: Never Used  Substance and Sexual Activity   Alcohol use: No    Alcohol/week: 0.0 standard drinks of alcohol   Drug use: No   Sexual activity: Not on file  Other Topics Concern   Not on  file  Social History Narrative   Born in [place], has a large extended family, oldest of several brothers-sister    Son anf G son live near by    Lost wife 2019. Lives by himself    Social Drivers of Health   Tobacco Use: Medium Risk (07/13/2024)   Patient History    Smoking Tobacco Use: Former    Smokeless Tobacco Use: Never    Passive Exposure: Not on file  Financial Resource Strain: Low Risk (04/08/2022)   Overall Financial Resource Strain (CARDIA)    Difficulty of Paying Living Expenses: Not hard at all  Food Insecurity: No Food Insecurity (06/21/2024)   Epic    Worried About Programme Researcher, Broadcasting/film/video in the Last Year: Never true    Ran Out of Food in the Last Year: Never true  Transportation Needs: No Transportation Needs (06/21/2024)   Epic    Lack of Transportation (Medical): No    Lack of Transportation (Non-Medical): No  Physical Activity: Inactive (04/08/2022)   Exercise Vital Sign    Days of Exercise per Week: 0 days    Minutes of Exercise per Session: 0 min  Stress: No Stress Concern Present (04/08/2022)   [employer] of Occupational Health - Occupational Stress Questionnaire    Feeling of Stress : Not at all  Social Connections: Moderately Isolated (06/15/2024)   Social Connection and Isolation Panel    Frequency of Communication with Friends and Family: More than  three times a week    Frequency of Social Gatherings with Friends and Family: More than three times a week    Attends Religious Services: More than 4 times per year    Active Member of Clubs or Organizations: No    Attends Banker Meetings: Never    Marital Status: Widowed  Depression (PHQ2-9): Low Risk (07/13/2024)   Depression (PHQ2-9)    PHQ-2 Score: 0  Alcohol Screen: Low Risk (04/08/2022)   Alcohol Screen    Last Alcohol Screening Score (AUDIT): 0  Housing: Unknown (06/21/2024)   Epic    Unable to Pay for Housing in the Last Year: No    Number of Times Moved in the Last Year: 0     Homeless in the Last Year: Patient unable to answer  Utilities: Not At Risk (06/21/2024)   Epic    Threatened with loss of utilities: No  Health Literacy: Not on file   Past Surgical History:  Procedure Laterality Date   CARDIAC CATHETERIZATION     CATARACT EXTRACTION     CHOLECYSTECTOMY N/A 02/15/2014   Procedure: LAPAROSCOPIC CHOLECYSTECTOMY with IOC;  Surgeon: Camellia CHRISTELLA Blush, MD;  Location[facility] OR;  Service: General;  Laterality: N/A;   COLONOSCOPY     CORONARY ARTERY BYPASS GRAFT  1995   ESOPHAGOGASTRODUODENOSCOPY N/A 03/02/2014   Procedure: ESOPHAGOGASTRODUODENOSCOPY (EGD);  Surgeon: Gwendlyn ONEIDA Buddy, MD;  Location[facility] END[facility];  Service: Endoscopy;  Laterality: N/A;  sarah/leone   Past Medical History:  Diagnosis Date   Allergic rhinitis    Allergy    seasonal   Arthritis    Bell palsy    CAD (coronary artery disease)    Cataract    Chronic renal insufficiency, stage II (mild)    Cr ~ 1.4, u/s 4-12 normal kidneys   Gallstone pancreatitis 2015   GERD (gastroesophageal reflux disease)    HOH (hard of hearing)    has a hearing aid L, sees audiology rountinely   Hyperglycemia    A1C 5.8 04-2010   Hyperlipemia    Hypertension    Pain, joint, multiple sites    uses valium , occ uses for insomnia. uses for shoulder  pain   Paroxysmal atrial fibrillation (HCC)    Personal history of colonic polyps    Pulmonary emboli (HCC)    02-2014   RAS (renal artery stenosis) 05-2012    Renal insufficiency    SCC (squamous cell carcinoma)    There were no vitals taken for this visit.  Opioid Risk Score:   Fall Risk Score:  `1  Depression screen [facility] 2/9     07/13/2024    9:21 AM 04/12/2024    9:09 AM 12/09/2023    9:45 AM 10/13/2023    9:00 AM 06/11/2023    2:43 PM 04/24/2023    9:26 AM 10/08/2022    8:58 AM  Depression screen PHQ 2/9  Decreased Interest 0 0 0 0 0 0 0  Down, Depressed, Hopeless 0 0 0 0 0 0 0  PHQ - 2 Score 0 0 0 0 0 0 0  Altered sleeping 0 0 0 0     Tired,  decreased energy 0 0 0 0     Change in appetite 0 0 0 0     Feeling bad or failure about yourself  0 0 0 0     Trouble concentrating 0 0 0 0     Moving slowly or fidgety/restless 0 0 0 0  Suicidal thoughts 0 0 0 0     PHQ-9 Score 0 0  0  0         Data saved with a previous flowsheet row definition    Review of Systems     Objective:   Physical Exam        Assessment & Plan:    "

## 2024-07-19 NOTE — Telephone Encounter (Signed)
 Copied from CRM [id number]. Topic: Clinical - Medication Refill >> Jul 19, 2024 11:17 AM Rea ORN wrote: Medication:  atenolol  (TENORMIN ) 50 MG tablet benazepril  5 MG tablet elfolate Plus 3-35-2 MG Tabs  magnesium  oxide 400 (240 Mg) MG tablet  Has the patient contacted their pharmacy? No (Agent: If no, request that the patient contact the pharmacy for the refill. If patient does not wish to contact the pharmacy document the reason why and proceed with request.) (Agent: If yes, when and what did the pharmacy advise?)  This is the patient's preferred pharmacy:  [facility] [phone] [facility] [place] - [address] [place] [address] Phone: [phone] Fax: [phone]  Is this the correct pharmacy for this prescription? Yes If no, delete pharmacy and type the correct one.   Has the prescription been filled recently? No  Is the patient out of the medication? No  Has the patient been seen for an appointment in the last year OR does the patient have an upcoming appointment? Yes  Can we respond through MyChart? No  Agent: Please be advised that Rx refills may take up to 3 business days. We ask that you follow-up with your pharmacy.

## 2024-07-20 MED ORDER — ATENOLOL 50 MG PO TABS: 75.0000 mg | ORAL_TABLET | Freq: Every day | ORAL | 1 refills | Status: AC

## 2024-07-20 MED ORDER — MAGNESIUM OXIDE -MG SUPPLEMENT 400 (240 MG) MG PO TABS: 400.0000 mg | ORAL_TABLET | Freq: Every day | ORAL | 1 refills | Status: AC

## 2024-07-20 MED ORDER — BENAZEPRIL HCL 5 MG PO TABS: 5.0000 mg | ORAL_TABLET | Freq: Two times a day (BID) | ORAL | 1 refills | Status: AC

## 2024-07-20 MED ORDER — L-METHYLFOLATE-B6-B12 3-35-2 MG PO TABS: 1.0000 | ORAL_TABLET | Freq: Every day | ORAL | 1 refills | Status: AC

## 2024-07-21 ENCOUNTER — Telehealth: Payer: Self-pay

## 2024-07-21 NOTE — Telephone Encounter (Signed)
 Spoke w/ Apolinar- informed all meds should have refills on file at [facility].

## 2024-07-21 NOTE — Telephone Encounter (Signed)
 Copied from CRM [id number]. Topic: General - Other >> Jul 21, 2024 10:31 AM Deleta RAMAN wrote: Reason for CRM: patient DIL is requesting call from nurse regarding medication [phone]

## 2024-07-26 ENCOUNTER — Other Ambulatory Visit: Payer: Self-pay | Admitting: Internal Medicine

## 2024-07-30 ENCOUNTER — Other Ambulatory Visit (HOSPITAL_COMMUNITY): Payer: Self-pay

## 2024-08-02 ENCOUNTER — Other Ambulatory Visit (HOSPITAL_COMMUNITY): Payer: Self-pay

## 2024-08-02 ENCOUNTER — Telehealth: Payer: Self-pay

## 2024-08-02 NOTE — Telephone Encounter (Signed)
 Pharmacy Patient Advocate Encounter   Received notification from Physician's Office that prior authorization for Metanx is required/requested.   Insurance verification completed.   The patient is insured through [employer].   Per test claim: Per test claim, medication is not covered due to plan/benefit exclusion, PA not submitted at this time

## 2024-08-20 ENCOUNTER — Ambulatory Visit: Admitting: Nurse Practitioner

## 2024-08-20 ENCOUNTER — Telehealth: Payer: Self-pay | Admitting: Internal Medicine

## 2024-08-20 ENCOUNTER — Telehealth: Payer: Self-pay | Admitting: Nurse Practitioner

## 2024-08-20 ENCOUNTER — Ambulatory Visit: Payer: Self-pay

## 2024-08-20 ENCOUNTER — Ambulatory Visit (INDEPENDENT_AMBULATORY_CARE_PROVIDER_SITE_OTHER)

## 2024-08-20 ENCOUNTER — Encounter: Payer: Self-pay | Admitting: Nurse Practitioner

## 2024-08-20 ENCOUNTER — Ambulatory Visit: Payer: Self-pay | Admitting: Nurse Practitioner

## 2024-08-20 VITALS — BP 136/70 | HR 76 | Temp 97.3°F | Ht 69.0 in | Wt 214.6 lb

## 2024-08-20 DIAGNOSIS — I480 Paroxysmal atrial fibrillation: Secondary | ICD-10-CM

## 2024-08-20 DIAGNOSIS — I10 Essential (primary) hypertension: Secondary | ICD-10-CM

## 2024-08-20 DIAGNOSIS — R601 Generalized edema: Secondary | ICD-10-CM

## 2024-08-20 DIAGNOSIS — N1832 Chronic kidney disease, stage 3b: Secondary | ICD-10-CM | POA: Insufficient documentation

## 2024-08-20 LAB — RENAL FUNCTION PANEL
Albumin: 3.9 g/dL (ref 3.5–5.2)
BUN: 24 mg/dL — ABNORMAL HIGH (ref 6–23)
CO2: 28 meq/L (ref 19–32)
Calcium: 9.4 mg/dL (ref 8.4–10.5)
Chloride: 107 meq/L (ref 96–112)
Creatinine, Ser: 1.49 mg/dL (ref 0.40–1.50)
GFR: 43.04 mL/min — ABNORMAL LOW
Glucose, Bld: 97 mg/dL (ref 70–99)
Phosphorus: 3 mg/dL (ref 2.3–4.6)
Potassium: 4.3 meq/L (ref 3.5–5.1)
Sodium: 141 meq/L (ref 135–145)

## 2024-08-20 LAB — BRAIN NATRIURETIC PEPTIDE: Pro B Natriuretic peptide (BNP): 196 pg/mL — ABNORMAL HIGH (ref 1.0–100.0)

## 2024-08-20 MED ORDER — FUROSEMIDE 20 MG PO TABS: 10.0000 mg | ORAL_TABLET | Freq: Every day | ORAL | 0 refills | Status: AC

## 2024-08-20 NOTE — Patient Instructions (Signed)
 Go to lab  Maintain a low salt diet DASH (Dietary Approaches to Stop Hypertension): Eating Plan  DASH stands for Dietary Approaches to Stop Hypertension. The DASH eating plan is a healthy eating plan that has been shown to: Lower high blood pressure (hypertension). Reduce your risk for type 2 diabetes, heart disease, and stroke. Help with weight loss. What are tips for following this plan? Reading food labels Check food labels for the amount of salt (sodium) per serving. Choose foods with less than 5 percent of the Daily Value (DV) of sodium. In general, foods with less than 300 milligrams (mg) of sodium per serving fit into this eating plan. To find whole grains, look for the word whole as the first word in the ingredient list. Shopping Buy products labeled as low-sodium or no salt added. Buy fresh foods. Avoid canned foods and pre-made or frozen meals. Cooking Try not to add salt when you cook. Use salt-free seasonings or herbs instead of table salt or sea salt. Check with your health care provider or pharmacist before using salt substitutes. Do not fry foods. Cook foods in healthy ways, such as baking, boiling, grilling, roasting, or broiling. Cook using oils that are good for your heart. These include olive, canola, avocado, soybean, and sunflower oil. Meal planning  Eat a balanced diet. This should include: 4 or more servings of fruits and 4 or more servings of vegetables each day. Try to fill half of your plate with fruits and vegetables. 6-8 servings of whole grains each day. 6 or less servings of lean meat, poultry, or fish each day. 1 oz is 1 serving. A 3 oz (85 g) serving of meat is about the same size as the palm of your hand. One egg is 1 oz (28 g). 2-3 servings of low-fat dairy each day. One serving is 1 cup (237 mL). 1 serving of nuts, seeds, or beans 5 times each week. 2-3 servings of heart-healthy fats. Healthy fats called omega-3 fatty acids are found in foods such  as walnuts, flaxseeds, fortified milks, and eggs. These fats are also found in cold-water fish, such as sardines, salmon, and mackerel. Limit how much you eat of: Canned or prepackaged foods. Food that is high in trans fat, such as fried foods. Food that is high in saturated fat, such as fatty meat. Desserts and other sweets, sugary drinks, and other foods with added sugar. Full-fat dairy products. Do not salt foods before eating. Do not eat more than 4 egg yolks a week. Try to eat at least 2 vegetarian meals a week. Eat more home-cooked food and less restaurant, buffet, and fast food. Lifestyle When eating at a restaurant, ask if your food can be made with less salt or no salt. If you drink alcohol: Limit how much you have to: 0-1 drink a day if you are male. 0-2 drinks a day if you are male. Know how much alcohol is in your drink. In the [place] one drink is one 12 oz bottle of beer (355 mL), one 5 oz glass of wine (148 mL), or one 1 oz glass of hard liquor (44 mL). General information Avoid eating more than 2,300 mg of salt a day. If you have hypertension, you may need to reduce your sodium intake to 1,500 mg a day. Work with your provider to stay at a healthy body weight or lose weight. Ask what the best weight range is for you. On most days of the week, get at least 30 minutes  of exercise that causes your heart to beat faster. This may include walking, swimming, or biking. Work with your provider or dietitian to adjust your eating plan to meet your specific calorie needs. What foods should I eat? Fruits All fresh, dried, or frozen fruit. Canned fruits that are in their natural juice and do not have sugar added to them. Vegetables Fresh or frozen vegetables that are raw, steamed, roasted, or grilled. Low-sodium or reduced-sodium tomato and vegetable juice. Low-sodium or reduced-sodium tomato sauce and tomato paste. Low-sodium or reduced-sodium canned vegetables. Grains Whole-grain  or whole-wheat bread. Whole-grain or whole-wheat pasta. Brown rice. Mcneil Madeira. Bulgur. Whole-grain and low-sodium cereals. Pita bread. Low-fat, low-sodium crackers. Whole-wheat flour tortillas. Meats and other proteins Skinless chicken or turkey. Ground chicken or turkey. Pork with fat trimmed off. Fish and seafood. Egg whites. Dried beans, peas, or lentils. Unsalted nuts, nut butters, and seeds. Unsalted canned beans. Lean cuts of beef with fat trimmed off. Low-sodium, lean precooked or cured meat, such as sausages or meat loaves. Dairy Low-fat (1%) or fat-free (skim) milk. Reduced-fat, low-fat, or fat-free cheeses. Nonfat, low-sodium ricotta or cottage cheese. Low-fat or nonfat yogurt. Low-fat, low-sodium cheese. Fats and oils Soft margarine without trans fats. Vegetable oil. Reduced-fat, low-fat, or light mayonnaise and salad dressings (reduced-sodium). Canola, safflower, olive, avocado, soybean, and sunflower oils. Avocado. Seasonings and condiments Herbs. Spices. Seasoning mixes without salt. Other foods Unsalted popcorn and pretzels. Fat-free sweets. The items listed above may not be all the foods and drinks you can have. Talk to a dietitian to learn more. What foods should I avoid? Fruits Canned fruit in a light or heavy syrup. Fried fruit. Fruit in cream or butter sauce. Vegetables Creamed or fried vegetables. Vegetables in a cheese sauce. Regular canned vegetables that are not marked as low-sodium or reduced-sodium. Regular canned tomato sauce and paste that are not marked as low-sodium or reduced-sodium. Regular tomato and vegetable juices that are not marked as low-sodium or reduced-sodium. Dene. Olives. Grains Baked goods made with fat, such as croissants, muffins, or some breads. Dry pasta or rice meal packs. Meats and other proteins Fatty cuts of meat. Ribs. Fried meat. [place]. Bologna, salami, and other precooked or cured meats, such as sausages or meat loaves, that are not  lean and low in sodium. Fat from the back of a pig (fatback). Bratwurst. Salted nuts and seeds. Canned beans with added salt. Canned or smoked fish. Whole eggs or egg yolks. Chicken or turkey with skin. Dairy Whole or 2% milk, cream, and half-and-half. Whole or full-fat cream cheese. Whole-fat or sweetened yogurt. Full-fat cheese. Nondairy creamers. Whipped toppings. Processed cheese and cheese spreads. Fats and oils Butter. Stick margarine. Lard. Shortening. Ghee. Bacon fat. Tropical oils, such as coconut, palm kernel, or palm oil. Seasonings and condiments Onion salt, garlic salt, seasoned salt, table salt, and sea salt. Worcestershire sauce. Tartar sauce. Barbecue sauce. Teriyaki sauce. Soy sauce, including reduced-sodium soy sauce. Steak sauce. Canned and packaged gravies. Fish sauce. Oyster sauce. Cocktail sauce. Store-bought horseradish. Ketchup. Mustard. Meat flavorings and tenderizers. Bouillon cubes. Hot sauces. Pre-made or packaged marinades. Pre-made or packaged taco seasonings. Relishes. Regular salad dressings. Other foods Salted popcorn and pretzels. The items listed above may not be all the foods and drinks you should avoid. Talk to a dietitian to learn more. Where to find more information National Heart, Lung, and Blood Institute (NHLBI): buffalodrycleaner.gl American Heart Association (AHA): heart.org Academy of Nutrition and Dietetics: eatright.org National Kidney Foundation (NKF): kidney.org This information is  not intended to replace advice given to you by your health care provider. Make sure you discuss any questions you have with your health care provider. Document Revised: 05/07/2024 Document Reviewed: 07/18/2022 Elsevier Patient Education  2025 [employer].

## 2024-08-20 NOTE — Telephone Encounter (Signed)
 Noted.

## 2024-08-20 NOTE — Assessment & Plan Note (Signed)
 Irregular rhythm today. Rate controlled with atenolol  Current use of eliquis , denies any sign of bleeding. Echo completed 06/2024: Left ventricular EF by estimation, is 60 to 65%. The left ventricle has normal function. Left ventricular endocardial border not optimally defined to evaluate regional wall motion. Left ventricular diastolic function could not be evaluated. Right ventricular systolic function is normal. The right ventricular size is normal. The mitral valve is normal in structure. No evidence of mitral valve regurgitation. No evidence of mitral stenosis. The aortic valve is normal in structure. Aortic valve regurgitation is not visualized. No aortic stenosis is present. Comparison(s): No significant change from prior study.   Under the care of cardiology.

## 2024-08-20 NOTE — Telephone Encounter (Signed)
 FYI Only or Action Required?: Action required by provider: request for appointment.  Patient was last seen in primary care on 07/13/2024 by Dustin Aloysius BRAVO, MD.  Called Nurse Triage reporting Foot Swelling.  Symptoms began yesterday.  Interventions attempted: Nothing.  Symptoms are: gradually worsening.  Triage Disposition: See PCP When Office is Open (Within 3 Days)  Patient/caregiver understands and will follow disposition?: Yes   Message from Rea ORN sent at 08/20/2024  8:32 AM EST  Reason for Triage: Swelling in ankles, hands, feet   Reason for Disposition  [1] MILD swelling of both ankles (i.e., pedal edema) AND [2] new-onset or getting worse  Answer Assessment - Initial Assessment Questions 1. ONSET: When did the swelling start? (e.g., minutes, hours, days)      Yesterday  2. LOCATION: What part of the leg is swollen?  Are both legs swollen or just one leg?     Both feet and ankles and hands Can still use hands and can still walk Removed rings from hands due to swelling  3. SEVERITY: How bad is the swelling? (e.g., localized; mild, moderate, severe)      Worse since onset  4. REDNESS: Is there redness or signs of infection?      Denies  5. PAIN: Is the swelling painful to touch? If Yes, ask: How painful is it?   (Scale 1-10; mild, moderate or severe)        6. FEVER: Do you have a fever? If Yes, ask: What is it, how was it measured, and when did it start?      Denies  7. CAUSE: What do you think is causing the leg swelling?      Cardiac hx  8. MEDICAL HISTORY: Do you have a history of blood clots (e.g., DVT), cancer, heart failure, kidney disease, or liver failure?      HTN/ PE  9. RECURRENT SYMPTOM: Have you had leg swelling before? If Yes, ask: When was the last time? What happened that time?      Yes, recurrent  10. OTHER SYMPTOMS: Pt's daughter in law denies chest pain, difficulty breathing        Bumps on belly  Pt reports  swelling of hands and feet Pt prefers to see PCP, however, no availability for PCP today. Routing to clinic to assist with scheduling Pt agrees with plan of care, will call back for any worsening symptoms  Protocols used: Leg Swelling and Edema-A-AH

## 2024-08-20 NOTE — Assessment & Plan Note (Signed)
 Repeat BMP Advised to maintain adequate oral hydration with water and low salt diet

## 2024-08-20 NOTE — Telephone Encounter (Signed)
 Patient's son Gentry Pilson was called and informed of the lab and CXR results and to have his father start the Lasix  10 mg daily for 5 days. He verbalized understanding and all questions (if any) were answered.

## 2024-08-20 NOTE — Telephone Encounter (Signed)
 Once results are received please contact pts son, Dustin Ashley [phone]   Deactivated mychart at pt/son request as he does not use.

## 2024-08-20 NOTE — Assessment & Plan Note (Addendum)
 BP at goal with atenolol , amlodipine  and benazepril  Admits to high salt diet and no water intake. Drinks coffee, ice tea, soda and juice. Presents today with generalized edema (bilateral LE and UE), associated with non productive cough and constant need to clear his throat. He denies any SOB/dyspnea or chest pain or dizziness or nausea BP Readings from Last 3 Encounters:  08/20/24 136/70  07/13/24 134/66  06/29/24 138/66    Wt Readings from Last 3 Encounters:  08/20/24 214 lb 9.6 oz (97.3 kg)  07/13/24 203 lb 4 oz (92.2 kg)  06/21/24 182 lb 1.6 oz (82.6 kg)    Maintain current meds Check BMP, BNP, and CXR: Stable renal function Elevated BNP compared to December RKM:Fpoi pulmonary vascular congestion and mild interstitial edema with mild bibasilar atelectasis and/or infiltrate. Small bilateral pleural effusions. Sent furosemide  10mg  x 5days. Schedule 1week f/up appointment with pcp or cardiology Plan to send furosemide  if stable renal function Advised to maintain a low salt diet, provided printed information. Advised to stop sugary drinks and consume water for hydration.

## 2024-08-20 NOTE — Telephone Encounter (Signed)
 Tried calling the number for patient's son Candi who is on DPR on file and could not leave a voice message due to voice mailbox is full.

## 2024-08-20 NOTE — Telephone Encounter (Signed)
 Patient's son Dustin Ashley who is on DPR on file returned my call. He was informed of his father's lab results and to start Furosemide  (Lasix ) 10 mg daily for 5 days and to schedule a 1 week follow up with his PCP or with Cardiology office.

## 2024-08-20 NOTE — Telephone Encounter (Signed)
 error

## 2024-08-20 NOTE — Progress Notes (Addendum)
 "               Established Patient Visit  Patient: Dustin Ashley   DOB: February 22, 1941   84 y.o. Male  MRN: [id number] Visit Date: 08/20/2024  Subjective:    Chief Complaint  Patient presents with   Edema    Swelling of bilateral legs, hands feet, abdomen and throat for about 10-11 days    HPI Accompanied by Son.  Paroxysmal A-fib (HCC) Irregular rhythm today. Rate controlled with atenolol  Current use of eliquis , denies any sign of bleeding. Echo completed 06/2024: Left ventricular EF by estimation, is 60 to 65%. The left ventricle has normal function. Left ventricular endocardial border not optimally defined to evaluate regional wall motion. Left ventricular diastolic function could not be evaluated. Right ventricular systolic function is normal. The right ventricular size is normal. The mitral valve is normal in structure. No evidence of mitral valve regurgitation. No evidence of mitral stenosis. The aortic valve is normal in structure. Aortic valve regurgitation is not visualized. No aortic stenosis is present. Comparison(s): No significant change from prior study.   Under the care of cardiology.  Essential hypertension BP at goal with atenolol , amlodipine  and benazepril  Admits to high salt diet and no water intake. Drinks coffee, ice tea, soda and juice. Presents today with generalized edema (bilateral LE and UE), associated with non productive cough and constant need to clear his throat. He denies any SOB/dyspnea or chest pain or dizziness or nausea BP Readings from Last 3 Encounters:  08/20/24 136/70  07/13/24 134/66  06/29/24 138/66    Wt Readings from Last 3 Encounters:  08/20/24 214 lb 9.6 oz (97.3 kg)  07/13/24 203 lb 4 oz (92.2 kg)  06/21/24 182 lb 1.6 oz (82.6 kg)    Maintain current meds Check BMP, BNP, and CXR: Stable renal function Elevated BNP compared to December RKM:Fpoi pulmonary vascular congestion and mild interstitial edema with mild bibasilar atelectasis  and/or infiltrate. Small bilateral pleural effusions. Sent furosemide  10mg  x 5days. Schedule 1week f/up appointment with pcp or cardiology Plan to send furosemide  if stable renal function Advised to maintain a low salt diet, provided printed information. Advised to stop sugary drinks and consume water for hydration.  Chronic kidney disease, stage 3b (HCC) Repeat BMP Advised to maintain adequate oral hydration with water and low salt diet   Reviewed medical, surgical, and social history today  Medications: Show/hide medication list[1] Reviewed past medical and social history.   ROS per HPI above      Objective:  BP 136/70 (BP Location: Right Arm, Patient Position: Sitting)   Pulse 76   Temp (!) 97.3 F (36.3 C) (Oral)   Ht 5' 9 (1.753 m)   Wt 214 lb 9.6 oz (97.3 kg)   SpO2 96%   BMI 31.69 kg/m      Physical Exam Vitals and nursing note reviewed.  Constitutional:      Appearance: He is obese.  Cardiovascular:     Rate and Rhythm: Normal rate. Rhythm irregular.     Pulses: Normal pulses.     Heart sounds: Normal heart sounds.     Comments: No JVD noted Pulmonary:     Effort: Pulmonary effort is normal.     Breath sounds: Normal breath sounds.  Abdominal:     General: There is distension.     Palpations: Abdomen is soft.     Tenderness: There is no abdominal tenderness. There is no guarding.  Musculoskeletal:     Right  upper arm: Normal. No tenderness.     Left upper arm: Normal. No tenderness.     Right forearm: Edema present. No tenderness.     Left forearm: Edema present. No tenderness.     Right wrist: Normal.     Left wrist: Normal.     Right hand: Normal.     Left hand: Normal.     Right lower leg: Edema present.     Left lower leg: Edema present.  Neurological:     Mental Status: He is alert and oriented to person, place, and time.     Results for orders placed or performed in visit on 08/20/24  Renal Function Panel  Result Value Ref Range    Sodium 141 135 - 145 mEq/L   Potassium 4.3 3.5 - 5.1 mEq/L   Chloride 107 96 - 112 mEq/L   CO2 28 19 - 32 mEq/L   Albumin 3.9 3.5 - 5.2 g/dL   BUN 24 (H) 6 - 23 mg/dL   Creatinine, Ser 8.50 0.40 - 1.50 mg/dL   Glucose, Bld 97 70 - 99 mg/dL   Phosphorus 3.0 2.3 - 4.6 mg/dL   GFR 56.95 (L) >39.99 mL/min   Calcium  9.4 8.4 - 10.5 mg/dL  B Nat Peptide  Result Value Ref Range   Pro B Natriuretic peptide (BNP) 196.0 (H) 1.0 - 100.0 pg/mL      Assessment & Plan:    Problem List Items Addressed This Visit     Chronic kidney disease, stage 3b (HCC)   Repeat BMP Advised to maintain adequate oral hydration with water and low salt diet      Essential hypertension   BP at goal with atenolol , amlodipine  and benazepril  Admits to high salt diet and no water intake. Drinks coffee, ice tea, soda and juice. Presents today with generalized edema (bilateral LE and UE), associated with non productive cough and constant need to clear his throat. He denies any SOB/dyspnea or chest pain or dizziness or nausea BP Readings from Last 3 Encounters:  08/20/24 136/70  07/13/24 134/66  06/29/24 138/66    Wt Readings from Last 3 Encounters:  08/20/24 214 lb 9.6 oz (97.3 kg)  07/13/24 203 lb 4 oz (92.2 kg)  06/21/24 182 lb 1.6 oz (82.6 kg)    Maintain current meds Check BMP, BNP, and CXR: Stable renal function Elevated BNP compared to December RKM:Fpoi pulmonary vascular congestion and mild interstitial edema with mild bibasilar atelectasis and/or infiltrate. Small bilateral pleural effusions. Sent furosemide  10mg  x 5days. Schedule 1week f/up appointment with pcp or cardiology Plan to send furosemide  if stable renal function Advised to maintain a low salt diet, provided printed information. Advised to stop sugary drinks and consume water for hydration.      Relevant Medications   furosemide  (LASIX ) 20 MG tablet   Paroxysmal A-fib (HCC)   Irregular rhythm today. Rate controlled with  atenolol  Current use of eliquis , denies any sign of bleeding. Echo completed 06/2024: Left ventricular EF by estimation, is 60 to 65%. The left ventricle has normal function. Left ventricular endocardial border not optimally defined to evaluate regional wall motion. Left ventricular diastolic function could not be evaluated. Right ventricular systolic function is normal. The right ventricular size is normal. The mitral valve is normal in structure. No evidence of mitral valve regurgitation. No evidence of mitral stenosis. The aortic valve is normal in structure. Aortic valve regurgitation is not visualized. No aortic stenosis is present. Comparison(s): No significant change from prior study.  Under the care of cardiology.      Relevant Medications   furosemide  (LASIX ) 20 MG tablet   Other Visit Diagnoses       Generalized edema    -  Primary   Relevant Medications   furosemide  (LASIX ) 20 MG tablet   Other Relevant Orders   Renal Function Panel (Completed)   B Nat Peptide (Completed)   DG Chest 2 View (Completed)      Return in 1 week (on 08/27/2024), or if symptoms worsen or fail to improve, for edema with pcp or cardiology.     Roselie Mood, NP      [1]  Outpatient Medications Prior to Visit  Medication Sig   acetaminophen  (TYLENOL ) 325 MG tablet Take 2 tablets (650 mg total) by mouth every 6 (six) hours as needed for mild pain (pain score 1-3) or headache.   amLODipine  (NORVASC ) 10 MG tablet Take 1 tablet (10 mg total) by mouth daily.   apixaban  (ELIQUIS ) 2.5 MG TABS tablet Take 1 tablet (2.5 mg total) by mouth 2 (two) times daily.   aspirin  EC 81 MG tablet Take 1 tablet (81 mg total) by mouth daily. Swallow whole.   atenolol  (TENORMIN ) 50 MG tablet Take 1.5 tablets (75 mg total) by mouth daily.   atorvastatin  (LIPITOR) 20 MG tablet TAKE 1 TABLET BY MOUTH EVERY DAY   benazepril  (LOTENSIN ) 5 MG tablet Take 1 tablet (5 mg total) by mouth 2 (two) times daily.   diazepam   (VALIUM ) 10 MG tablet Take 1 tablet (10 mg total) by mouth every 12 (twelve) hours as needed for anxiety or sleep.   fexofenadine (ALLEGRA) 180 MG tablet Take 180 mg by mouth daily.   l-methylfolate-B6-B12 (METANX) 3-35-2 MG TABS tablet Take 1 tablet by mouth daily.   magnesium  oxide (MAG-OX) 400 (240 Mg) MG tablet Take 1 tablet (400 mg total) by mouth daily.   polyethylene glycol (MIRALAX  / GLYCOLAX ) 17 g packet Take 17 g by mouth daily as needed.   No facility-administered medications prior to visit.   "

## 2024-08-20 NOTE — Telephone Encounter (Signed)
 Please schedule- unfortunately may have to be next week. No appt's available today.

## 2024-08-20 NOTE — Telephone Encounter (Signed)
 No triage required, patient triaged at [phone] today. No change since previous phone call. Family member calling to discuss appointment.  Scheduled at [place] 08/20/2024, alternative location at caller's request so that patient can be seen today.   Message from [place] S sent at 08/20/2024  9:29 AM EST  Reason for Triage: patient recently spoke with nt swelling and recently had a stroke. Requesting sooner visit and speak with nt

## 2024-08-20 NOTE — Addendum Note (Signed)
 Addended by: KATHEEN GARDEN L on: 08/20/2024 03:04 PM   Modules accepted: Orders

## 2024-08-20 NOTE — Telephone Encounter (Signed)
 Pt is scheduled

## 2024-08-23 ENCOUNTER — Ambulatory Visit: Admitting: Family Medicine

## 2024-08-26 ENCOUNTER — Ambulatory Visit: Admitting: Physician Assistant

## 2024-09-16 ENCOUNTER — Ambulatory Visit: Admitting: Diagnostic Neuroimaging

## 2024-10-11 ENCOUNTER — Ambulatory Visit: Admitting: Internal Medicine
